# Patient Record
Sex: Female | Born: 1935
Health system: Southern US, Community
[De-identification: ages and names within clinical notes are randomized; demographics above are authoritative.]

## PROBLEM LIST (undated history)

## (undated) DIAGNOSIS — C4490 Unspecified malignant neoplasm of skin, unspecified: Secondary | ICD-10-CM

## (undated) DIAGNOSIS — R0600 Dyspnea, unspecified: Secondary | ICD-10-CM

## (undated) DIAGNOSIS — I509 Heart failure, unspecified: Secondary | ICD-10-CM

## (undated) DIAGNOSIS — M1990 Unspecified osteoarthritis, unspecified site: Secondary | ICD-10-CM

## (undated) DIAGNOSIS — F0390 Unspecified dementia without behavioral disturbance: Secondary | ICD-10-CM

## (undated) DIAGNOSIS — I10 Essential (primary) hypertension: Secondary | ICD-10-CM

## (undated) DIAGNOSIS — R0609 Other forms of dyspnea: Secondary | ICD-10-CM

## (undated) DIAGNOSIS — E785 Hyperlipidemia, unspecified: Secondary | ICD-10-CM

## (undated) DIAGNOSIS — I219 Acute myocardial infarction, unspecified: Secondary | ICD-10-CM

## (undated) DIAGNOSIS — Z9289 Personal history of other medical treatment: Secondary | ICD-10-CM

## (undated) DIAGNOSIS — I2510 Atherosclerotic heart disease of native coronary artery without angina pectoris: Secondary | ICD-10-CM

## (undated) DIAGNOSIS — E119 Type 2 diabetes mellitus without complications: Secondary | ICD-10-CM

## (undated) DIAGNOSIS — E7800 Pure hypercholesterolemia, unspecified: Secondary | ICD-10-CM

## (undated) HISTORY — DX: Other forms of dyspnea: R06.09

## (undated) HISTORY — DX: Dyspnea, unspecified: R06.00

## (undated) HISTORY — PX: ABDOMINAL HYSTERECTOMY: SHX81

## (undated) HISTORY — DX: Personal history of other medical treatment: Z92.89

## (undated) HISTORY — DX: Hyperlipidemia, unspecified: E78.5

---

## 2001-03-21 ENCOUNTER — Other Ambulatory Visit: Admission: RE | Admit: 2001-03-21 | Discharge: 2001-03-21 | Payer: Self-pay | Admitting: General Surgery

## 2002-08-23 ENCOUNTER — Ambulatory Visit (HOSPITAL_COMMUNITY): Admission: RE | Admit: 2002-08-23 | Discharge: 2002-08-23 | Payer: Self-pay | Admitting: Family Medicine

## 2002-08-23 ENCOUNTER — Encounter: Payer: Self-pay | Admitting: Family Medicine

## 2002-12-21 ENCOUNTER — Inpatient Hospital Stay (HOSPITAL_COMMUNITY): Admission: RE | Admit: 2002-12-21 | Discharge: 2002-12-26 | Payer: Self-pay | Admitting: Family Medicine

## 2002-12-24 ENCOUNTER — Encounter: Payer: Self-pay | Admitting: *Deleted

## 2003-03-02 ENCOUNTER — Emergency Department (HOSPITAL_COMMUNITY): Admission: EM | Admit: 2003-03-02 | Discharge: 2003-03-03 | Payer: Self-pay | Admitting: *Deleted

## 2003-03-03 ENCOUNTER — Encounter: Payer: Self-pay | Admitting: *Deleted

## 2003-03-07 ENCOUNTER — Encounter: Payer: Self-pay | Admitting: Family Medicine

## 2003-03-07 ENCOUNTER — Ambulatory Visit (HOSPITAL_COMMUNITY): Admission: RE | Admit: 2003-03-07 | Discharge: 2003-03-07 | Payer: Self-pay | Admitting: Family Medicine

## 2003-11-07 ENCOUNTER — Emergency Department (HOSPITAL_COMMUNITY): Admission: EM | Admit: 2003-11-07 | Discharge: 2003-11-07 | Payer: Self-pay | Admitting: Emergency Medicine

## 2003-11-14 ENCOUNTER — Emergency Department (HOSPITAL_COMMUNITY): Admission: EM | Admit: 2003-11-14 | Discharge: 2003-11-14 | Payer: Self-pay | Admitting: Emergency Medicine

## 2003-11-28 ENCOUNTER — Ambulatory Visit (HOSPITAL_COMMUNITY): Admission: RE | Admit: 2003-11-28 | Discharge: 2003-11-28 | Payer: Self-pay | Admitting: Cardiovascular Disease

## 2004-01-15 ENCOUNTER — Emergency Department (HOSPITAL_COMMUNITY): Admission: EM | Admit: 2004-01-15 | Discharge: 2004-01-15 | Payer: Self-pay | Admitting: Emergency Medicine

## 2004-07-18 ENCOUNTER — Emergency Department (HOSPITAL_COMMUNITY): Admission: EM | Admit: 2004-07-18 | Discharge: 2004-07-18 | Payer: Self-pay | Admitting: Emergency Medicine

## 2004-09-29 ENCOUNTER — Ambulatory Visit (HOSPITAL_COMMUNITY): Admission: RE | Admit: 2004-09-29 | Discharge: 2004-09-29 | Payer: Self-pay | Admitting: Family Medicine

## 2004-11-15 ENCOUNTER — Emergency Department (HOSPITAL_COMMUNITY): Admission: EM | Admit: 2004-11-15 | Discharge: 2004-11-16 | Payer: Self-pay | Admitting: *Deleted

## 2006-06-23 ENCOUNTER — Ambulatory Visit: Payer: Self-pay | Admitting: Gastroenterology

## 2006-07-05 DIAGNOSIS — Z9289 Personal history of other medical treatment: Secondary | ICD-10-CM

## 2006-07-05 HISTORY — DX: Personal history of other medical treatment: Z92.89

## 2006-07-12 ENCOUNTER — Ambulatory Visit (HOSPITAL_COMMUNITY): Admission: RE | Admit: 2006-07-12 | Discharge: 2006-07-12 | Payer: Self-pay | Admitting: Gastroenterology

## 2006-07-12 ENCOUNTER — Ambulatory Visit: Payer: Self-pay | Admitting: Gastroenterology

## 2006-08-01 ENCOUNTER — Ambulatory Visit (HOSPITAL_COMMUNITY): Admission: RE | Admit: 2006-08-01 | Discharge: 2006-08-01 | Payer: Self-pay | Admitting: *Deleted

## 2006-08-05 ENCOUNTER — Ambulatory Visit (HOSPITAL_COMMUNITY): Admission: RE | Admit: 2006-08-05 | Discharge: 2006-08-05 | Payer: Self-pay | Admitting: *Deleted

## 2008-05-10 ENCOUNTER — Ambulatory Visit (HOSPITAL_COMMUNITY): Admission: RE | Admit: 2008-05-10 | Discharge: 2008-05-10 | Payer: Self-pay | Admitting: Internal Medicine

## 2008-08-04 ENCOUNTER — Emergency Department (HOSPITAL_COMMUNITY): Admission: EM | Admit: 2008-08-04 | Discharge: 2008-08-05 | Payer: Self-pay | Admitting: Emergency Medicine

## 2009-01-28 ENCOUNTER — Ambulatory Visit (HOSPITAL_COMMUNITY): Admission: RE | Admit: 2009-01-28 | Discharge: 2009-01-28 | Payer: Self-pay | Admitting: Internal Medicine

## 2010-06-09 ENCOUNTER — Ambulatory Visit (HOSPITAL_COMMUNITY): Admission: RE | Admit: 2010-06-09 | Discharge: 2010-06-09 | Payer: Self-pay | Admitting: Internal Medicine

## 2010-10-30 ENCOUNTER — Emergency Department (HOSPITAL_COMMUNITY)
Admission: EM | Admit: 2010-10-30 | Discharge: 2010-10-30 | Payer: Self-pay | Source: Home / Self Care | Admitting: Emergency Medicine

## 2011-02-01 LAB — DIFFERENTIAL
Basophils Relative: 1 % (ref 0–1)
Eosinophils Absolute: 0.1 10*3/uL (ref 0.0–0.7)
Monocytes Absolute: 0.3 10*3/uL (ref 0.1–1.0)
Monocytes Relative: 6 % (ref 3–12)
Neutrophils Relative %: 82 % — ABNORMAL HIGH (ref 43–77)

## 2011-02-01 LAB — COMPREHENSIVE METABOLIC PANEL
ALT: 16 U/L (ref 0–35)
Alkaline Phosphatase: 28 U/L — ABNORMAL LOW (ref 39–117)
CO2: 27 mEq/L (ref 19–32)
Chloride: 104 mEq/L (ref 96–112)
Glucose, Bld: 150 mg/dL — ABNORMAL HIGH (ref 70–99)
Potassium: 4.5 mEq/L (ref 3.5–5.1)
Sodium: 139 mEq/L (ref 135–145)
Total Protein: 7 g/dL (ref 6.0–8.3)

## 2011-02-01 LAB — CBC
HCT: 35 % — ABNORMAL LOW (ref 36.0–46.0)
Hemoglobin: 11.6 g/dL — ABNORMAL LOW (ref 12.0–15.0)
RBC: 3.85 MIL/uL — ABNORMAL LOW (ref 3.87–5.11)
RDW: 14.4 % (ref 11.5–15.5)
WBC: 5.7 10*3/uL (ref 4.0–10.5)

## 2011-02-01 LAB — POCT CARDIAC MARKERS: Troponin i, poc: <0.05 ng/mL (ref 0.00–0.09)

## 2011-04-09 DIAGNOSIS — Z9289 Personal history of other medical treatment: Secondary | ICD-10-CM

## 2011-04-09 HISTORY — DX: Personal history of other medical treatment: Z92.89

## 2011-04-09 NOTE — Consult Note (Signed)
NAME:  Robin Guzman, Robin Guzman              ACCOUNT NO.:  [id number]   MEDICAL RECORD NO.:  [id number]         PATIENT TYPE:  AMB   LOCATION:                                FACILITY:  [facility]   PHYSICIAN:  Kassie Mends, M.D.      DATE OF BIRTH:  [birth year]   DATE OF CONSULTATION:  06/23/2006  DATE OF DISCHARGE:                                   CONSULTATION   REASON FOR CONSULTATION:  Anemia, colonoscopy.   HISTORY OF PRESENT ILLNESS:  Robin Guzman is a 77 year old Caucasian female  who presents for further evaluation of anemia.  She has never had a  colonoscopy.  She recently had routine blood work which revealed a  hemoglobin of 10.6, hematocrit 34.3, MCV was 93.7, and platelets 217,000.  Vitamin B12 was 1317, folate 10.2, iron 77, TIBC 456, iron saturation was  slightly low at 17%, and ferritin normal at 129.  She returned 3 Hemoccults;  however, we have not received those results.  She denies any problems with  her bowel movements.  She denies any melena, rectal bleeding, abdominal  pain, nausea, or vomiting.  She has heartburn, which is controlled on  Prevacid.  She has had GERD for approximately a year.  She denies any  dysphagia, odynophagia, or weight loss.   CURRENT MEDICATIONS:  Prevacid 30 mg daily, Tricor 145 mg daily, aspirin 81  mg daily, B12 at 2000 mg daily, Ocuvite 1 daily, Lipitor 20 mg daily, Tandem  1 daily, Metformin 500 mg b.i.d., Norvasc 5 mg daily, Coreg 25 mg 2 daily,  Avandia 4 mg 1 daily.   ALLERGIES:  DAYPRO and ADVIL.   PAST MEDICAL HISTORY:  Normocytic anemia, coronary artery disease,  hypertension, hypercholesterolemia, diabetes, and GERD.   PAST SURGICAL HISTORY:  She had a benign tumor removed from her abdomen  around age 77.  She does know any details.  Appendectomy and partial  hysterectomy.   FAMILY HISTORY:  Mother died of liver disease at age 35, did not consume  alcohol.  Father died of heart disease at age 24.  No other family members  with liver  disease to her knowledge.  No family history of colorectal  cancer.   SOCIAL HISTORY:  She is widowed and has 3 children.  She is retired  Scientist, research (life sciences).  She has never been a smoker.  No alcohol use.   REVIEW OF SYSTEMS:  GI:  See HPI.  CONSTITUTIONAL:  No weight loss.  CARDIOPULMONARY:  No chest pain or shortness of breath.   PHYSICAL EXAMINATION:  VITAL SIGNS:  Weight 162.  Height 5 feet 0 inches.  Temp 98.  Blood pressure 138/76.  Pulse 78.  GENERAL:  Pleasant, well-nourished, well-developed, elderly Caucasian female  in no acute distress.  SKIN:  Warm and dry.  No jaundice.  HEENT:  Pupils equal, round, and reactive to light.  Conjunctivae are pink.  Sclerae anicteric.  Oropharyngeal mucosa moist and pink.  No lesions,  erythema, or exudate.  No lymphadenopathy or thyromegaly.  CHEST:  Lungs are clear to auscultation.  CARDIAC:  Exam reveals regular rate and  rhythm.  No murmurs, rubs, or  gallops.  Normal S1 and S2.  ABDOMEN:  Positive bowel sounds.  Soft, nondistended, and nontender.  No  organomegaly or masses.  EXTREMITIES:  No edema.   IMPRESSION:  Robin Guzman is a 77 year old lady who was recently found to  have a mild normocytic anemia.  Hemoccult status unknown.  She had slight  low iron saturations for her serum iron and ferritin was normal.  She has  never had a colonoscopy, therefore recommend one.  She has gastroesophageal  reflux disease well controlled on proton pump inhibitor therapy.   PLAN:  1.  Colonoscopy in the near future with Kassie Mends, M.D.  2.  Hold aspirin for 4 days prior to the procedure.  3.  Will adjust her Metformin and Avandia to half dose day of prep.  4.  Hold Tandem for 7 days.  5.  Retrieve hemoccult results from Dr. Scharlene Gloss office.   I would like to thank Catalina Pizza, M.D. for allowing [place] to see Robin Guzman in  consultation.      Tana Coast, P.A.      Kassie Mends, M.D.  Electronically Signed    LL/MEDQ  D:  06/23/2006  T:   06/23/2006  Job:  [id number]   cc:   Catalina Pizza, M.D.  Fax: [phone]

## 2011-04-09 NOTE — H&P (Signed)
NAME:  Robin Guzman, Robin Guzman                        ACCOUNT NO.:  [id number]   MEDICAL RECORD NO.:  [id number]                   PATIENT TYPE:  INP   LOCATION:  A226                                 FACILITY:  [facility]   PHYSICIAN:  Mila Homer. Sudie Bailey, M.D.           DATE OF BIRTH:  [birth year]   DATE OF ADMISSION:  12/21/2002  DATE OF DISCHARGE:                                HISTORY & PHYSICAL   HISTORY OF PRESENT ILLNESS:  This 77 year old woman came to the office today  for follow up of hypertension.  She noted she had been feeling somewhat weak  recently and the last 4-6 weeks she had two to three episodes a week in  which she had palpitations lasting 10-15 minutes.  These would come on no  matter what she was doing whether it was energetic or not.   Currently lives at home.  Her husband died last 08/20/23.  She has had  hypertension but she has been on atenolol 50 mg once daily but  hydrochlorothiazide 25 mg once daily was added about a month ago due to  systolics around 150.   FAMILY HISTORY:  The patient has no family history for diabetes.   REVIEW OF SYSTEMS:  She has had nocturia x2 in the last month.  Mouth had  been very dry.   PHYSICAL EXAMINATION:  Exam in the office showed a pleasant elderly woman.  She was oriented/alert.  She was mildly obese in really no acute distress.  Mucous membranes were somewhat dry.  Negative cervical nodes.  The heart had  a regular rhythm, rate about 120 and after I told her she had to come to the  hospital it went up to 140.  Her lungs appeared clear throughout.  The  abdomen was soft without hepatosplenomegaly or mass, no tenderness.  There  was no edema in the ankles.  BP was about 114/70 on my check.  The pulse was  114.   LABORATORY DATA:  CBC showed H&H 14.1/42.0, MCV of 89, platelet count  294,000.  MET-7 showed a sodium 130, potassium 3.8, chloride 89, glucose  480, BUN 20, creatinine 1.2, SGOT 48, and SGPT 59.  She also had an EKG  which showed significant Q's in III, small Q's in II and aVF and also Q's in  V2, V3, V4, V5.   ADMISSION DIAGNOSES:  1. Diabetes mellitus.  2. Dehydration.  3. Electrolyte abnormalities.  4. Probable coronary artery disease status post possible inferior myocardial     infarction and acute myocardial infarction probably from silent infarcts.  5. Essential hypertension.   PLAN OF TREATMENT:  Continue her atenolol 25 mg once daily given and we are  starting to treat her diabetes with insulin.  Add Enalapril 10 mg once  daily, ASA 81 mg once daily, and have her on a cardiac monitor.  She will be  on normal saline IV 100 cc/hr initially with 10  mEq of KCl per liter.  Recheck MET-7 and CBC in the morning.  Cardiac enzymes will be done tonight  and in the morning.  Recheck an EKG tomorrow.  I have talked to  [facility] and given EKG findings which appear old and also  given the fact there are no beds at [facility] we will treat at  [facility] on a monitor.  Discussed this with the family and the  patient and they are in agreement to treat her here at present.                                               Mila Homer. Sudie Bailey, M.D.    SDK/MEDQ  D:  12/21/2002  T:  12/21/2002  Job:  [id number]

## 2011-04-09 NOTE — Group Therapy Note (Signed)
   NAME:  Robin Guzman, MCCAUL                        ACCOUNT NO.:  [id number]   MEDICAL RECORD NO.:  [id number]                   PATIENT TYPE:  INP   LOCATION:  A226                                 FACILITY:  [facility]   PHYSICIAN:  Mila Homer. Sudie Bailey, M.D.           DATE OF BIRTH:  [birth year]   DATE OF PROCEDURE:  12/24/2002  DATE OF DISCHARGE:                                   PROGRESS NOTE   SUBJECTIVE:  Generally, she is feeling somewhat better.  She still has some  feelings of being short of breath and funny feelings in her chest.   OBJECTIVE:  VITAL SIGNS:  Temperature 97.7, pulse 71, respiratory rate 20,  blood pressure 137/66.  GENERAL:  She is sitting up in bed in no acute distress.  Feet dangling,  eating breakfast.  She is well-developed and somewhat obese elderly woman  who is oriented and alert.  LUNGS:  Clear throughout.  HEART:  Regular rhythm without murmur, rate of 70.  ABDOMEN:  Soft without tenderness.  EXTREMITIES:  There is no edema of the ankles.  SKIN:  Skin turgor is normal.   LABORATORY DATA:  Today, the weight is 154.9 pounds.  Most recent sugars  have been 218, 365, 228, 218, and 322.  Her C peptide is 1.6.   ASSESSMENT:  1. Type 2 diabetes.  2. Probable coronary artery disease, status post inferior myocardial     infarction and anterior myocardial infarction.   PLAN:  Cardiology to see her today.  Start Glucophage 500 mg b.i.d.  Consider adding Avandia if okay with cardiology.                                               Mila Homer. Sudie Bailey, M.D.    SDK/MEDQ  D:  12/24/2002  T:  12/24/2002  Job:  [id number]

## 2011-04-09 NOTE — Cardiovascular Report (Signed)
NAME:  Robin Guzman, Robin Guzman              ACCOUNT NO.:  [id number]   MEDICAL RECORD NO.:  [id number]          PATIENT TYPE:  OIB   LOCATION:  2899                         FACILITY:  [facility]   PHYSICIAN:  Darlin Priestly, MD  DATE OF BIRTH:  20-Jan-1928   DATE OF PROCEDURE:  08/05/2006  DATE OF DISCHARGE:  08/05/2006                              CARDIAC CATHETERIZATION   PROCEDURE:  1. Left heart catheterization.  2. Coronary angiography.  3. Left ventriculogram.   ATTENDING PHYSICIAN:  Darlin Priestly, M.D.   COMPLICATIONS:  None.   INDICATIONS:  [name] is a 77 year old female, patient of Dr. Catalina Pizza, Dr. Ilene Qua with history of hypertension, diabetes,  hyperlipidemia, who has intermittent complaints of shortness of breath.  She  did have a remote Cardiolite scan revealing no significant ischemia.  However, she had repeat scan secondary to __________  shortness of breath on  August 14 suggesting moderate ischemia in the inferolateral and apical  distribution.  Because of her ongoing symptoms and now evidence of possible  ischemia, she is now referred for chronic catheterization to rule out  significant CAD.   DESCRIPTION OF PROCEDURE:  After giving informed consent, the patient was  brought to the cardiac catheterization laboratory.  Right and left groin  shaved, prepped and draped in the usual sterile fashion.  ECG monitoring  established.  Using the modified Seldinger technique, a #6 intra-arterial  sheath to the right femoral artery, a 6-French diagnostic catheter then  performed diagnostic angiography.   The left main is a large vessel with no significant disease.   The LAD is a medium size vessel which coursed towards the apex which was two  diagonal branches.  The LAD was noted to have some mild proximal  calcification with mild 20% proximal narrowing.  There is no further  significant disease in the LAD.   First diagonal is a small vessel with no  significant disease.   The second diagonal is a medium size vessel which bifurcates distally with  no significant disease.   The left circumflex is a medium size vessel which coursed __________  two  obtuse marginal branches.  The __________  circumflex has no significant  disease.   The first OM is a medium size vessel which bifurcates distally with no  significant disease.   The second OM is a small vessel with no significant disease.  There are  faint left to right collaterals noted to the distal PDA.   The right coronary artery is a large vessel which is dominant and begins  with PDA as well as posterolateral branch.  There is no significant disease  in the RCA, PDA and posterolateral branch.   Left ventriculogram reveals preserved EF of 70%.   Hemodynamics system reveals arterial pressure of 127/71,  LV systemic  pressure 120/8, LVEDP 15.   CONCLUSION:  1. No significant coronary artery disease.  2. Normal left ventricular systolic function.      Darlin Priestly, MD  Electronically Signed     RHM/MEDQ  D:  08/05/2006  T:  08/06/2006  Job:  [id number]   cc:   Catalina Pizza, M.D.  Dani Gobble, MD

## 2011-04-09 NOTE — Discharge Summary (Signed)
NAME:  Robin Guzman, Robin Guzman                        ACCOUNT NO.:  [id number]   MEDICAL RECORD NO.:  [id number]                   PATIENT TYPE:  INP   LOCATION:  A226                                 FACILITY:  [facility]   PHYSICIAN:  Mila Homer. Sudie Bailey, M.D.           DATE OF BIRTH:  [birth year]   DATE OF ADMISSION:  12/21/2002  DATE OF DISCHARGE:  12/26/2002                                 DISCHARGE SUMMARY   SUMMARY:  A 77 year old who was admitted to the hospital with diabetes.  She  had a benign six-day hospitalization extending from December 21, 2002 to  December 26, 2002.  Vital signs remained stable.   LABORATORY DATA:  Admission white count 6300 with an H&H 12.2/35.3.  Rechecked several days later at 11.7/34.4.  Admission sodium 130, chloride  94, bicarb 36, glucose 431.  Recheck:  Sodium 135, chloride 106, glucose  235.  Her AST was slightly elevated at 58 and ALT slightly elevated at 55.  The patient's cardiac enzymes were negative.  Recheck the following day:  Troponin slightly elevated at 0.05.  TSH 2.189.  C peptide 1.6.  Lipid  profile pending.   Admission EKG showed what appeared to be an old inferior infarct, old  anterior infarct, with sinus tachycardia.  Heart rate had dropped from 116  to 67 by the following day.  The day of discharge still had deepening Q in  V2 and V3 consistent at least with an anteroseptal MI.   HOSPITAL COURSE:  She was admitted to the hospital with Accu-Checks a.c. and  h.s., IV normal saline 100 mL/hour with 10 mEq KCl per liter, put on a  sliding scale Humulin R insulin.  After 2 liters of normal saline she was  switched to half normal saline.  She was started on enalapril  10 mg daily with atenolol 25 mg daily, EC-ASA 81 mg daily, p.r.n. Tylenol.  She was given Protonix 40 mg p.o. daily.  She was given an 1800 calorie  diet.   The following day with a low blood pressure, enalapril was decreased from 10  mg to 5 mg daily.  She was given Lovenox 1  mg/kg subcutaneous daily  prophylactically.  Hospital day #4 she was started on Glucophage 500 mg  b.i.d.  Her atenolol was increased to 50 daily, and then it was discontinued  and switched to Coreg 6.25 mg b.i.d.  Her diet was changed to 1500 calorie  ADA.  She actually did well on this regimen.  Her sugars gradually dropped  to the low 200 range.  She is up and around via the walker.   She was seen by [facility] with initial evaluation in the  hospital and plans for further evaluation outpatient.   DISCHARGE MEDICATIONS:  She was discharged home with:  1. Glucophage 500 mg b.i.d.  2. Coreg 6.25 mg b.i.d.  3. Enalapril 5 mg daily.   FOLLOW-UP:  In the  office in two days.   DISCHARGE INSTRUCTIONS:  She had a glucose monitoring machine she went home  with.  Discussed diet at length.  I discussed the importance of weight loss  at length.  Family was in attendance with this.   FINAL DISCHARGE DIAGNOSES:  1. Type 2 diabetes, poorly controlled.  2. Dehydration.  3. Electrolyte abnormalities.  4. Presumptive coronary artery disease with evidence for old silent     myocardial infarction.                                               Mila Homer. Sudie Bailey, M.D.    SDK/MEDQ  D:  12/26/2002  T:  12/26/2002  Job:  [id number]

## 2011-04-09 NOTE — Group Therapy Note (Signed)
   NAME:  Robin Guzman, Robin Guzman                        ACCOUNT NO.:  [id number]   MEDICAL RECORD NO.:  [id number]                   PATIENT TYPE:  INP   LOCATION:  A226                                 FACILITY:  [facility]   PHYSICIAN:  Mila Homer. Sudie Bailey, M.D.           DATE OF BIRTH:  Dec 09, 1927   DATE OF PROCEDURE:  DATE OF DISCHARGE:                                   PROGRESS NOTE   SUBJECTIVE:  The patient does feel much better.  Is drinking well.   OBJECTIVE:  GENERAL:  She is supine in bed, oriented, alert, in no acute  distress, well-developed, well-nourished, somewhat obese elderly female.  VITAL SIGNS:  Temperature 97.8, pulse 65, respiratory rate 20, blood  pressure 111/61.  HEART:  Regular rhythm, rate of about 70.  LUNGS:  Clear throughout.  ABDOMEN:  Soft without hepatosplenomegaly or mass.  No tenderness.  SKIN:  Turgor is normal.  Mucous membranes moist.  EXTREMITIES:  There is no edema of the ankles.   LABORATORIES:  Her glucose most recently were 218, 243, 202, 269.  Today's  white cell count is 4300, H&H 7.7/34.4 and after 7:00 she had a glucose 235,  but SGOT slightly elevated at 58, SGPT 55, albumin 2.8.   ASSESSMENT:  1. Type 2 diabetes mellitus (C. peptide 1.6).  2. Probable coronary artery disease status post IMI/AMI.  3. Electrolyte abnormalities have cleared.  4. Elevated LFTs, question etiology.  5. Essential hypertension, well controlled.   PLAN:  Continue with sliding scale insulin.  She is now on Glucophage 500 mg  b.i.d.  [facility] is evaluating her heart disease.                                               Mila Homer. Sudie Bailey, M.D.    SDK/MEDQ  D:  12/25/2002  T:  12/25/2002  Job:  [id number]

## 2011-04-09 NOTE — Op Note (Signed)
NAME:  Robin Guzman, Robin Guzman              ACCOUNT NO.:  [id number]   MEDICAL RECORD NO.:  [id number]          PATIENT TYPE:  AMB   LOCATION:  DAY                           FACILITY:  [facility]   PHYSICIAN:  Kassie Mends, M.D.      DATE OF BIRTH:  November 23, 1927   DATE OF PROCEDURE:  07/12/2006  DATE OF DISCHARGE:                                 OPERATIVE REPORT   PROCEDURE:  Colonoscopy.   INDICATION FOR EXAM:  [name] is a 77 year old female who presents with  normocytic anemia and average risk for developing colon cancer.   FINDINGS:  1. Normal colon.  No diverticula, inflammatory changes, polyps, masses, or      vascular ectasia seen.  2. Normal retroflexed view of the rectum.   RECOMMENDATIONS:  1. Consider hematology-oncology evaluation for normocytic anemia.  No      source for anemia identified.  2. Follow up with Dr. Dwana Melena.   PROCEDURE TECHNIQUE:  Physical exam was performed and informed consent was  obtained from the patient after explaining the benefits, risks and  alternatives to the procedure.  The patient was connected to the monitor and  placed in the left lateral position.  Continuous oxygen was provided via  nasal cannula and IV medicine administered through an indwelling cannula.  After administration of sedation and rectal exam, the scope was advanced  under direct visualization to the cecum.  The scope was subsequently removed  slowly by carefully examining the color, texture, anatomy and integrity of  mucosa on the way out.  The patient was recovered in the endoscopy suite and  discharged home in satisfactory condition.      Kassie Mends, M.D.  Electronically Signed     SM/MEDQ  D:  07/12/2006  T:  07/12/2006  Job:  [id number]   cc:   Catalina Pizza, M.D.  Fax: [phone]

## 2011-04-09 NOTE — Group Therapy Note (Signed)
   NAME:  Robin Guzman, Robin Guzman                        ACCOUNT NO.:  [id number]   MEDICAL RECORD NO.:  [id number]                   PATIENT TYPE:  INP   LOCATION:  A226                                 FACILITY:  [facility]   PHYSICIAN:  Angus G. Renard Matter, M.D.              DATE OF BIRTH:  1928/03/06   DATE OF PROCEDURE:  12/23/2002  DATE OF DISCHARGE:                                   PROGRESS NOTE   SUBJECTIVE:  This patient was admitted with poorly controlled diabetes,  dehydration.  She does have underlying coronary artery disease,  hypertension.  Her sugars were markedly elevated last night above 400.  The  patient remains on sliding scale Humulin R insulin.   OBJECTIVE:  Vital signs:  Blood pressure 94/49, respirations 20, pulse 65,  temperature 97.  Heart:  Regular rhythm.  Lungs:  Clear to P&A.  Abdomen:  No palpable organs or masses.   ASSESSMENT:  The patient was admitted with dehydration, poorly controlled  diabetes, electrolyte abnormality.   PLAN:  To continue current regimen.  Continue to monitor the patient's blood  sugars carefully.                                               Angus G. Renard Matter, M.D.    AGM/MEDQ  D:  12/23/2002  T:  12/24/2002  Job:  [id number]

## 2011-04-09 NOTE — Group Therapy Note (Signed)
   NAME:  BYANKA, LANDRUS                        ACCOUNT NO.:  [id number]   MEDICAL RECORD NO.:  [id number]                   PATIENT TYPE:  INP   LOCATION:  A226                                 FACILITY:  [facility]   PHYSICIAN:  Mila Homer. Sudie Bailey, M.D.           DATE OF BIRTH:  [birth year]   DATE OF PROCEDURE:  DATE OF DISCHARGE:                                   PROGRESS NOTE   SUBJECTIVE:  The patient feels slightly better than she did when she came in  last night.  She is now on enalapril 10 mg daily with atenolol 25 mg daily,  ASA 81 mg daily, Protonix 40 mg daily.  She is on IV normal saline and  sliding scale Humulin R.  I did note no more palpitations last night, but  once or twice felt like she had to take a deep breath.   OBJECTIVE:  Today the temperature 97 degrees, pulse 69, respiratory rate 18,  blood pressure 99/46.  Color is good.  She is oriented and alert in no acute  distress; well-developed, in fact somewhat obese; elderly woman.  The heart  has a regular rhythm with a rate of about 70.  The lungs are clear  throughout moving air well.  The abdomen was soft and obese, without  hepatosplenomegaly or mass.  There was trace edema of the ankles. Glucose  today was 431, sodium 130, chloride 94, bicarb 36.  CPK was 57, MB 3.0, and  troponin 0.04.  Her repeat EKG showed Q's in III and also in V3.  There were  flipped T's in III and V3.  There were also flipped T's in V4.   ASSESSMENT:  1. Probable type II diabetes mellitus.  2. Dehydration.  3. Electrolyte abnormalities.  4. Probable old IMI/AMI.   PLAN:  Continue IV fluids and a strict diet.  Will follow up with cardiology  in 2 days.  Will check a CPAP by tomorrow.  Will start Lovenox  prophylactically.                                               Mila Homer. Sudie Bailey, M.D.    SDK/MEDQ  D:  12/22/2002  T:  12/22/2002  Job:  [id number]

## 2011-08-25 LAB — BASIC METABOLIC PANEL
CO2: 26
Calcium: 9.1
Creatinine, Ser: 0.84
GFR calc Af Amer: >60
Glucose, Bld: 137 — ABNORMAL HIGH

## 2011-08-25 LAB — CBC
MCHC: 33.6
Platelets: 175
RDW: 15

## 2011-08-25 LAB — DIFFERENTIAL
Basophils Absolute: 0
Basophils Relative: 0
Neutro Abs: 4.3
Neutrophils Relative %: 76

## 2011-08-25 LAB — POCT CARDIAC MARKERS
CKMB, poc: 1.2
Troponin i, poc: <0.05

## 2012-02-16 ENCOUNTER — Other Ambulatory Visit (HOSPITAL_COMMUNITY): Payer: Self-pay | Admitting: Internal Medicine

## 2012-02-16 ENCOUNTER — Ambulatory Visit (HOSPITAL_COMMUNITY)
Admission: RE | Admit: 2012-02-16 | Discharge: 2012-02-16 | Disposition: A | Discharge status: Home / Self Care | Payer: Medicare Other | Source: Ambulatory Visit | Attending: Internal Medicine | Admitting: Internal Medicine

## 2012-02-16 DIAGNOSIS — R05 Cough: Secondary | ICD-10-CM

## 2012-02-16 DIAGNOSIS — R059 Cough, unspecified: Secondary | ICD-10-CM | POA: Diagnosis not present

## 2012-03-08 DIAGNOSIS — D0430 Carcinoma in situ of skin of unspecified part of face: Secondary | ICD-10-CM | POA: Diagnosis not present

## 2012-03-08 DIAGNOSIS — D0439 Carcinoma in situ of skin of other parts of face: Secondary | ICD-10-CM | POA: Diagnosis not present

## 2012-03-08 DIAGNOSIS — L989 Disorder of the skin and subcutaneous tissue, unspecified: Secondary | ICD-10-CM | POA: Diagnosis not present

## 2012-03-20 DIAGNOSIS — D0439 Carcinoma in situ of skin of other parts of face: Secondary | ICD-10-CM | POA: Diagnosis not present

## 2012-06-27 DIAGNOSIS — D0439 Carcinoma in situ of skin of other parts of face: Secondary | ICD-10-CM | POA: Diagnosis not present

## 2012-07-19 DIAGNOSIS — Z23 Encounter for immunization: Secondary | ICD-10-CM | POA: Diagnosis not present

## 2012-07-26 DIAGNOSIS — E119 Type 2 diabetes mellitus without complications: Secondary | ICD-10-CM | POA: Diagnosis not present

## 2012-07-26 DIAGNOSIS — I10 Essential (primary) hypertension: Secondary | ICD-10-CM | POA: Diagnosis not present

## 2012-07-26 DIAGNOSIS — I5021 Acute systolic (congestive) heart failure: Secondary | ICD-10-CM | POA: Diagnosis not present

## 2013-02-03 ENCOUNTER — Encounter (HOSPITAL_COMMUNITY): Payer: Self-pay

## 2013-02-03 ENCOUNTER — Emergency Department (HOSPITAL_COMMUNITY)
Admission: EM | Admit: 2013-02-03 | Discharge: 2013-02-03 | Disposition: A | Discharge status: Home / Self Care | Payer: Medicare Other | Attending: Emergency Medicine | Admitting: Emergency Medicine

## 2013-02-03 ENCOUNTER — Emergency Department (HOSPITAL_COMMUNITY): Payer: Medicare Other

## 2013-02-03 DIAGNOSIS — M25512 Pain in left shoulder: Secondary | ICD-10-CM

## 2013-02-03 DIAGNOSIS — M19019 Primary osteoarthritis, unspecified shoulder: Secondary | ICD-10-CM | POA: Diagnosis not present

## 2013-02-03 DIAGNOSIS — M25519 Pain in unspecified shoulder: Secondary | ICD-10-CM | POA: Insufficient documentation

## 2013-02-03 DIAGNOSIS — R0602 Shortness of breath: Secondary | ICD-10-CM | POA: Insufficient documentation

## 2013-02-03 DIAGNOSIS — R079 Chest pain, unspecified: Secondary | ICD-10-CM | POA: Diagnosis not present

## 2013-02-03 HISTORY — DX: Heart failure, unspecified: I50.9

## 2013-02-03 HISTORY — DX: Pure hypercholesterolemia, unspecified: E78.00

## 2013-02-03 HISTORY — DX: Type 2 diabetes mellitus without complications: E11.9

## 2013-02-03 HISTORY — DX: Essential (primary) hypertension: I10

## 2013-02-03 LAB — CBC WITH DIFFERENTIAL/PLATELET
Basophils Absolute: 0 10*3/uL (ref 0.0–0.1)
Eosinophils Relative: 1 % (ref 0–5)
Lymphocytes Relative: 10 % — ABNORMAL LOW (ref 12–46)
Lymphs Abs: 0.6 10*3/uL — ABNORMAL LOW (ref 0.7–4.0)
Neutro Abs: 4.9 10*3/uL (ref 1.7–7.7)
Neutrophils Relative %: 84 % — ABNORMAL HIGH (ref 43–77)
Platelets: 207 10*3/uL (ref 150–400)
RBC: 4.04 MIL/uL (ref 3.87–5.11)
RDW: 14.3 % (ref 11.5–15.5)
WBC: 5.8 10*3/uL (ref 4.0–10.5)

## 2013-02-03 LAB — COMPREHENSIVE METABOLIC PANEL
ALT: 15 U/L (ref 0–35)
AST: 18 U/L (ref 0–37)
Alkaline Phosphatase: 30 U/L — ABNORMAL LOW (ref 39–117)
CO2: 26 mEq/L (ref 19–32)
Calcium: 9.6 mg/dL (ref 8.4–10.5)
Chloride: 102 mEq/L (ref 96–112)
GFR calc non Af Amer: 44 mL/min — ABNORMAL LOW (ref >90)
Potassium: 4.7 mEq/L (ref 3.5–5.1)
Sodium: 137 mEq/L (ref 135–145)

## 2013-02-03 MED ORDER — TRAMADOL HCL 50 MG PO TABS: 50.0000 mg | ORAL_TABLET | Freq: Four times a day (QID) | ORAL | Status: DC | PRN

## 2013-02-03 MED ORDER — TRAMADOL HCL 50 MG PO TABS
50.0000 mg | ORAL_TABLET | Freq: Once | ORAL | Status: AC
  Administered 2013-02-03: 50 mg via ORAL
  Filled 2013-02-03: qty 1

## 2013-02-03 NOTE — ED Provider Notes (Signed)
History     This chart was scribed for Robin Lyons, MD, MD by Smitty Pluck, ED Scribe. The patient was seen in room APA06/APA06 and the patient's care was started at 7:44 AM.   CSN: [id number]  Arrival date & time 02/03/13  0725      No chief complaint on file.    The history is provided by the patient, a relative and medical records. No language interpreter was used.   Robin Guzman is a 79 y.o. female with hx of DM, MI and CHF who presents to the Emergency Department complaining of waxing and waning, severe left shoulder pain radiating to lett arm onset 3 days ago. She denies hx of similar symptoms. She states the pain takes her breath away. She reports that when pain is at its worst she has SOB. She denies alleviating factors. Pt states that shoulder pain is aggravated by movement of left arm. She reports taking tylenol with minor relief. Pt denies injury to shoulder, fever, chills, nausea, vomiting, diarrhea, weakness in left arm, cough and any other pain.   No past medical history on file.  No past surgical history on file.  No family history on file.  History  Substance Use Topics  . Smoking status: Not on file  . Smokeless tobacco: Not on file  . Alcohol Use: Not on file    OB History   No data available      Review of Systems  Constitutional: Negative for fever and chills.  Respiratory: Positive for shortness of breath.   Gastrointestinal: Negative for nausea and vomiting.  Musculoskeletal: Positive for arthralgias.  Neurological: Negative for weakness and numbness.  All other systems reviewed and are negative.    Allergies  Review of patient's allergies indicates not on file.  Home Medications  No current outpatient prescriptions on file.  BP 180/73  Pulse 80  Temp(Src) 97.7 F (36.5 C) (Oral)  Resp 16  Ht 5' (1.524 m)  Wt 155 lb (70.308 kg)  BMI 30.27 kg/m2  SpO2 96%  Physical Exam  Nursing note and vitals reviewed. Constitutional: She is  oriented to person, place, and time. She appears well-developed and well-nourished. No distress.  HENT:  Head: Normocephalic and atraumatic.  Eyes: Conjunctivae are normal.  Cardiovascular: Normal rate, regular rhythm and normal heart sounds.   Pulmonary/Chest: Effort normal and breath sounds normal. No respiratory distress. She has no wheezes.  Musculoskeletal:  Left shoulder appears grossly normal. There is mild tenderness to palpation in scapular region that seems to reproduce her pain. Shoulder has good ROM. Distal pulses and motor are intact.   Neurological: She is alert and oriented to person, place, and time.  Skin: Skin is warm and dry.  Psychiatric: She has a normal mood and affect. Her behavior is normal.    ED Course  Procedures (including critical care time) DIAGNOSTIC STUDIES: Oxygen Saturation is 96% on room air, adequate by my interpretation.    COORDINATION OF CARE: 7:48 AM Discussed ED treatment with pt and pt agrees.  7:48 AM Ordered:  Medications  traMADol (ULTRAM) tablet 50 mg (50 mg Oral Given 02/03/13 0827)   9:30 AM Recheck: Discussed lab results and treatment course with pt. Pt is feeling better. Pt is ready for discharge.      Labs Reviewed  CBC WITH DIFFERENTIAL - Abnormal; Notable for the following:    Neutrophils Relative 84 (*)    Lymphocytes Relative 10 (*)    Lymphs Abs 0.6 (*)  All other components within normal limits  COMPREHENSIVE METABOLIC PANEL - Abnormal; Notable for the following:    Glucose, Bld 109 (*)    Creatinine, Ser 1.14 (*)    Alkaline Phosphatase 30 (*)    GFR calc non Af Amer 44 (*)    GFR calc Af Amer 50 (*)    All other components within normal limits  TROPONIN I   Dg Chest 2 View  02/03/2013  *RADIOLOGY REPORT*  Clinical Data: Shoulder pain.  Chest pain.  CHEST - 2 VIEW  Comparison: Two-view chest 02/16/2012.  Findings: Mild cardiomegaly is evident.  There is no edema or effusion to suggest failure.  The lungs are  clear.  The visualized soft tissues and bony thorax are unremarkable.  IMPRESSION:  1.  Mild cardiomegaly without failure. 2.  No acute cardiopulmonary disease.   Original Report Authenticated By: Marin Roberts, M.D.    Dg Shoulder Left  02/03/2013  *RADIOLOGY REPORT*  Clinical Data: Left shoulder pain.  Chest pain.  LEFT SHOULDER - 2+ VIEW  Comparison: None.  Findings: The left shoulder is located.  Degenerative changes are evident at the [facility] joint.  No acute bone or soft tissue abnormalities present.  The visualized left hemithorax is clear.  IMPRESSION: Negative left shoulder.   Original Report Authenticated By: Marin Roberts, M.D.      No diagnosis found.   Date: 02/03/2013  Rate: 73  Rhythm: normal sinus rhythm  QRS Axis: normal  Intervals: normal  ST/T Wave abnormalities: normal  Conduction Disutrbances:none  Narrative Interpretation:   Old EKG Reviewed: unchanged    MDM  The patient presents here with pain in the posterior aspect of the left shoulder that seems very musculoskeletal in nature.  The workup does not suggest a cardiac etiology despite symptoms for the past three days.  She is feeling better with tramadol and I believe she is stable for discharge with the same.  To follow up or return prn if she worsens.        I personally performed the services described in this documentation, which was scribed in my presence. The recorded information has been reviewed and is accurate.      Robin Lyons, MD 02/03/13 [phone]

## 2013-02-03 NOTE — ED Notes (Signed)
Pt arrives to er via POV complaining of left shoulder pain that began about two or three days ago. Pt denies any injury. Pt states she went to her Dr Wednesday but he was not able to see her. Pt took some tylenol and felt some relief but the pain has returned this morning.

## 2013-02-14 DIAGNOSIS — M5412 Radiculopathy, cervical region: Secondary | ICD-10-CM | POA: Diagnosis not present

## 2013-02-23 DIAGNOSIS — M5412 Radiculopathy, cervical region: Secondary | ICD-10-CM | POA: Diagnosis not present

## 2013-02-24 ENCOUNTER — Encounter (HOSPITAL_COMMUNITY): Payer: Self-pay

## 2013-02-24 ENCOUNTER — Emergency Department (HOSPITAL_COMMUNITY)
Admission: EM | Admit: 2013-02-24 | Discharge: 2013-02-24 | Disposition: A | Discharge status: Home / Self Care | Payer: Medicare Other | Attending: Emergency Medicine | Admitting: Emergency Medicine

## 2013-02-24 DIAGNOSIS — E119 Type 2 diabetes mellitus without complications: Secondary | ICD-10-CM | POA: Diagnosis not present

## 2013-02-24 DIAGNOSIS — E785 Hyperlipidemia, unspecified: Secondary | ICD-10-CM | POA: Diagnosis not present

## 2013-02-24 DIAGNOSIS — Z7982 Long term (current) use of aspirin: Secondary | ICD-10-CM | POA: Diagnosis not present

## 2013-02-24 DIAGNOSIS — I509 Heart failure, unspecified: Secondary | ICD-10-CM | POA: Diagnosis not present

## 2013-02-24 DIAGNOSIS — Z79899 Other long term (current) drug therapy: Secondary | ICD-10-CM | POA: Insufficient documentation

## 2013-02-24 DIAGNOSIS — E7800 Pure hypercholesterolemia, unspecified: Secondary | ICD-10-CM | POA: Diagnosis not present

## 2013-02-24 DIAGNOSIS — M25512 Pain in left shoulder: Secondary | ICD-10-CM

## 2013-02-24 DIAGNOSIS — M25519 Pain in unspecified shoulder: Secondary | ICD-10-CM | POA: Diagnosis not present

## 2013-02-24 DIAGNOSIS — I10 Essential (primary) hypertension: Secondary | ICD-10-CM | POA: Insufficient documentation

## 2013-02-24 MED ORDER — HYDROMORPHONE HCL PF 1 MG/ML IJ SOLN
1.0000 mg | Freq: Once | INTRAMUSCULAR | Status: AC
  Administered 2013-02-24: 1 mg via INTRAMUSCULAR
  Filled 2013-02-24: qty 1

## 2013-02-24 MED ORDER — HYDROMORPHONE HCL 4 MG PO TABS: 4.0000 mg | ORAL_TABLET | Freq: Four times a day (QID) | ORAL | Status: DC | PRN

## 2013-02-24 NOTE — ED Provider Notes (Signed)
History  This chart was scribed for Robin Lennert, MD by Robin Guzman, ED Scribe. This patient was seen in room APA12/APA12 and the patient's care was started at 3:12 PM.  CSN: [id number]  Arrival date & time 02/24/13  1508   First MD Initiated Contact with Patient 02/24/13 1512      Chief Complaint  Patient presents with  . Shoulder Pain     Patient is a 79 y.o. female presenting with shoulder pain. The history is provided by the patient. No language interpreter was used.  Shoulder Pain This is a new problem. The current episode started more than 1 week ago. The problem occurs constantly. The problem has not changed since onset.Pertinent negatives include no chest pain. Nothing relieves the symptoms.    Robin Guzman is a 79 y.o. female who presents to the Emergency Department complaining of 3 weeks of gradual onset, gradually worsening, constant left shoulder pain that radiates into her back. She denies any known injuries and reports that the pain is aggravated by movement of the left arm. has been seen in the ED and by her PCP multiple times for the same. She was seen by her PCP yesterday, had a negative xray done and was given 5 mg hydrocodone with mild improvement. She states that she was told that if the pain continued an MRI would be scheduled. Daughter also states that the pt has finished one round of prednisone with no improvement. Pt denies any other associated symptoms. She has a h/o DM, HTN, HLD and CHF. She denies smoking and alcohol use.  PCP is Dr. Margo Guzman  Past Medical History  Diagnosis Date  . Diabetes mellitus without complication   . CHF (congestive heart failure)   . Hypertension   . Hypercholesteremia     Past Surgical History  Procedure Laterality Date  . Abdominal hysterectomy      No family history on file.  History  Substance Use Topics  . Smoking status: Never Smoker   . Smokeless tobacco: Not on file  . Alcohol Use: No   No OB history  provided.  Review of Systems  Constitutional: Negative for fatigue.  HENT: Negative for congestion, sinus pressure and ear discharge.   Respiratory: Negative for cough.   Cardiovascular: Negative for chest pain.  Genitourinary: Negative for frequency and hematuria.  Musculoskeletal: Positive for arthralgias. Negative for back pain.  Skin: Negative for rash.    Allergies  Advil and Daypro  Home Medications   Current Outpatient Rx  Name  Route  Sig  Dispense  Refill  . amLODipine (NORVASC) 5 MG tablet   Oral   Take 5 mg by mouth daily.         [name] aspirin EC 81 MG tablet   Oral   Take 81 mg by mouth daily.         [name] atorvastatin (LIPITOR) 20 MG tablet   Oral   Take 20 mg by mouth daily.         . carvedilol (COREG) 25 MG tablet   Oral   Take 25 mg by mouth daily.         . Choline Fenofibrate (TRILIPIX) 135 MG capsule   Oral   Take 135 mg by mouth daily.         . enalapril (VASOTEC) 20 MG tablet   Oral   Take 20 mg by mouth daily.         [name] esomeprazole (NEXIUM) 40 MG capsule  Oral   Take 40 mg by mouth daily as needed (for hearburn).          [name] glipiZIDE (GLUCOTROL XL) 2.5 MG 24 hr tablet   Oral   Take 2.5 mg by mouth daily.         . metFORMIN (GLUCOPHAGE) 500 MG tablet   Oral   Take 500 mg by mouth 2 (two) times daily.         . traMADol (ULTRAM) 50 MG tablet   Oral   Take 1 tablet (50 mg total) by mouth every 6 (six) hours as needed for pain.   20 tablet   0     Triage Vitals: BP 168/90  Pulse 87  Temp(Src) 97.9 F (36.6 C)  Resp 20  Ht 5\' 4"  (1.626 m)  Wt 155 lb (70.308 kg)  BMI 26.59 kg/m2  SpO2 99%  Physical Exam  Nursing note and vitals reviewed. Constitutional: She is oriented to person, place, and time. She appears well-developed and well-nourished.  HENT:  Head: Normocephalic and atraumatic.  Eyes: Conjunctivae are normal.  Neck: No tracheal deviation present.  Cardiovascular: Normal rate.   No murmur  heard. Pulmonary/Chest: Effort normal.  Musculoskeletal:  Pain with extension of left shoulder, neurovascularly intact   Neurological: She is alert and oriented to person, place, and time.  Skin: Skin is warm and dry.  Psychiatric: She has a normal mood and affect. Her behavior is normal.    ED Course  Procedures (including critical care time)  DIAGNOSTIC STUDIES: Oxygen Saturation is 99% on room air, normal by my interpretation.    COORDINATION OF CARE: 3:22 PM-Discussed treatment plan which includes Dilaudid injection with pt at bedside and pt agreed to plan.   3:30 PM- Ordered 1 mg Dilaudid injection  4:21 PM-Pt rechecked and reports improved with medications listed above. Discussed discharge plan which includes Dilaudid prescription with pt and pt agreed to plan. Daughter states that the pt's PCP is in the process of setting up an MRI appointment.   Labs Reviewed - No data to display No results found.   No diagnosis found.    MDM        The chart was scribed for me under my direct supervision.  I personally performed the history, physical, and medical decision making and all procedures in the evaluation of this patient.Robin Lennert, MD 02/24/13 [phone]

## 2013-02-24 NOTE — ED Notes (Signed)
EKG was ordered by triage RN. Before beginning EKG Dr.Zammit came into room and asked that EKG not be completed until pt was evaluated. ED tech assigned to that side aware that EKG has not been completed.

## 2013-02-24 NOTE — ED Notes (Signed)
Pt reports left shoulder pain since march 15th, has been to ed and pmd multiple times. deneis any known injury

## 2013-02-24 NOTE — ED Notes (Signed)
Patient with no complaints at this time. Respirations even and unlabored. Skin warm/dry. Discharge instructions reviewed with patient at this time. Patient given opportunity to voice concerns/ask questions. Patient discharged at this time and left Emergency Department with steady gait.   

## 2013-02-27 ENCOUNTER — Other Ambulatory Visit (HOSPITAL_COMMUNITY): Payer: Self-pay | Admitting: Internal Medicine

## 2013-02-27 DIAGNOSIS — M5412 Radiculopathy, cervical region: Secondary | ICD-10-CM

## 2013-03-01 ENCOUNTER — Ambulatory Visit (HOSPITAL_COMMUNITY)
Admission: RE | Admit: 2013-03-01 | Discharge: 2013-03-01 | Disposition: A | Discharge status: Home / Self Care | Payer: Medicare Other | Source: Ambulatory Visit | Attending: Internal Medicine | Admitting: Internal Medicine

## 2013-03-01 DIAGNOSIS — R209 Unspecified disturbances of skin sensation: Secondary | ICD-10-CM | POA: Insufficient documentation

## 2013-03-01 DIAGNOSIS — M4310 Spondylolisthesis, site unspecified: Secondary | ICD-10-CM | POA: Diagnosis not present

## 2013-03-01 DIAGNOSIS — M5412 Radiculopathy, cervical region: Secondary | ICD-10-CM

## 2013-03-01 DIAGNOSIS — M4802 Spinal stenosis, cervical region: Secondary | ICD-10-CM | POA: Insufficient documentation

## 2013-03-01 DIAGNOSIS — M542 Cervicalgia: Secondary | ICD-10-CM | POA: Diagnosis not present

## 2013-03-02 DIAGNOSIS — H52229 Regular astigmatism, unspecified eye: Secondary | ICD-10-CM | POA: Diagnosis not present

## 2013-03-02 DIAGNOSIS — E119 Type 2 diabetes mellitus without complications: Secondary | ICD-10-CM | POA: Diagnosis not present

## 2013-03-02 DIAGNOSIS — H5200 Hypermetropia, unspecified eye: Secondary | ICD-10-CM | POA: Diagnosis not present

## 2013-03-02 DIAGNOSIS — H524 Presbyopia: Secondary | ICD-10-CM | POA: Diagnosis not present

## 2013-03-26 DIAGNOSIS — M47812 Spondylosis without myelopathy or radiculopathy, cervical region: Secondary | ICD-10-CM | POA: Diagnosis not present

## 2013-07-11 DIAGNOSIS — Z23 Encounter for immunization: Secondary | ICD-10-CM | POA: Diagnosis not present

## 2013-07-18 ENCOUNTER — Encounter: Payer: Self-pay | Admitting: *Deleted

## 2013-07-19 ENCOUNTER — Encounter: Payer: Self-pay | Admitting: *Deleted

## 2013-07-20 ENCOUNTER — Encounter: Payer: Self-pay | Admitting: Cardiovascular Disease

## 2013-07-24 ENCOUNTER — Encounter: Payer: Self-pay | Admitting: Cardiovascular Disease

## 2013-07-24 ENCOUNTER — Ambulatory Visit (INDEPENDENT_AMBULATORY_CARE_PROVIDER_SITE_OTHER): Payer: Medicare Other | Admitting: Cardiovascular Disease

## 2013-07-24 VITALS — BP 142/76 | HR 70 | Resp 20 | Ht 60.0 in | Wt 148.0 lb

## 2013-07-24 DIAGNOSIS — I10 Essential (primary) hypertension: Secondary | ICD-10-CM | POA: Diagnosis not present

## 2013-07-24 DIAGNOSIS — I509 Heart failure, unspecified: Secondary | ICD-10-CM

## 2013-07-24 DIAGNOSIS — I2510 Atherosclerotic heart disease of native coronary artery without angina pectoris: Secondary | ICD-10-CM | POA: Diagnosis not present

## 2013-07-24 DIAGNOSIS — R0609 Other forms of dyspnea: Secondary | ICD-10-CM | POA: Diagnosis not present

## 2013-07-24 DIAGNOSIS — E119 Type 2 diabetes mellitus without complications: Secondary | ICD-10-CM

## 2013-07-24 DIAGNOSIS — I5033 Acute on chronic diastolic (congestive) heart failure: Secondary | ICD-10-CM

## 2013-07-24 DIAGNOSIS — R0600 Dyspnea, unspecified: Secondary | ICD-10-CM

## 2013-07-24 DIAGNOSIS — I5032 Chronic diastolic (congestive) heart failure: Secondary | ICD-10-CM

## 2013-07-24 DIAGNOSIS — E785 Hyperlipidemia, unspecified: Secondary | ICD-10-CM

## 2013-07-24 MED ORDER — FUROSEMIDE 20 MG PO TABS: 20.0000 mg | ORAL_TABLET | Freq: Every day | ORAL | Status: DC

## 2013-07-24 MED ORDER — CARVEDILOL 25 MG PO TABS: 12.5000 mg | ORAL_TABLET | Freq: Two times a day (BID) | ORAL | Status: DC

## 2013-07-24 NOTE — Patient Instructions (Addendum)
Your physician has recommended you make the following change in your medication: FUROSEMIDE 20 MG DAILY, TAKE COREG 12.5 MG TWICE DAILY  Your physician recommends that you weigh, daily, at the same time every day, and in the same amount of clothing. Please record your daily weights on the handout provided and bring it to your next appointment.  Your physician has requested that you have an echocardiogram. Echocardiography is a painless test that uses sound waves to create images of your heart. It provides your doctor with information about the size and shape of your heart and how well your heart's chambers and valves are working. This procedure takes approximately one hour. There are no restrictions for this procedure.  Your physician recommends that you schedule a follow-up appointment in: 4 WEEKS

## 2013-07-31 ENCOUNTER — Ambulatory Visit (HOSPITAL_COMMUNITY)
Admission: RE | Admit: 2013-07-31 | Discharge: 2013-07-31 | Disposition: A | Discharge status: Home / Self Care | Payer: Medicare Other | Source: Ambulatory Visit | Attending: Cardiovascular Disease | Admitting: Cardiovascular Disease

## 2013-07-31 DIAGNOSIS — R0602 Shortness of breath: Secondary | ICD-10-CM

## 2013-07-31 DIAGNOSIS — R0609 Other forms of dyspnea: Secondary | ICD-10-CM | POA: Insufficient documentation

## 2013-07-31 DIAGNOSIS — I509 Heart failure, unspecified: Secondary | ICD-10-CM | POA: Diagnosis not present

## 2013-07-31 DIAGNOSIS — R0600 Dyspnea, unspecified: Secondary | ICD-10-CM

## 2013-07-31 DIAGNOSIS — I5033 Acute on chronic diastolic (congestive) heart failure: Secondary | ICD-10-CM

## 2013-07-31 DIAGNOSIS — R0989 Other specified symptoms and signs involving the circulatory and respiratory systems: Secondary | ICD-10-CM | POA: Insufficient documentation

## 2013-07-31 NOTE — Progress Notes (Signed)
2D Echo Performed 07/31/2013    Aydan Phoenix, RCS  

## 2013-08-07 DIAGNOSIS — I5032 Chronic diastolic (congestive) heart failure: Secondary | ICD-10-CM | POA: Insufficient documentation

## 2013-08-07 DIAGNOSIS — I10 Essential (primary) hypertension: Secondary | ICD-10-CM | POA: Insufficient documentation

## 2013-08-07 DIAGNOSIS — E7800 Pure hypercholesterolemia, unspecified: Secondary | ICD-10-CM | POA: Insufficient documentation

## 2013-08-07 DIAGNOSIS — E119 Type 2 diabetes mellitus without complications: Secondary | ICD-10-CM | POA: Insufficient documentation

## 2013-08-07 NOTE — Assessment & Plan Note (Signed)
Target BP is less than 130/80 secondary to the presence of diabetes, but caution with excessive BP control is advised in this octogenarian.

## 2013-08-07 NOTE — Assessment & Plan Note (Signed)
Reports "good" glycemic control

## 2013-08-07 NOTE — Assessment & Plan Note (Addendum)
At this point in time the suspicion for coronary etiology of her symptoms is low, but might consider a repeat functional study such as a nuclear stress test. Note that in 2007 she had a "false positive" study that suggested anteroseptal ischemia. She actually had an occluded distal posterior descending artery that fills via left to right collaterals but had no disease in the LAD distribution.

## 2013-08-07 NOTE — Progress Notes (Signed)
Patient ID: Robin Guzman, female   DOB: 09-28-1928, 79 y.o.   MRN: [id number]     Reason for office visit Robin Guzman is a moderately obese elderly woman with a history of systemic hypertension, diabetes and minor coronary atherosclerosis who had an episode of congestive heart failure roughly 2 years ago that improved after discontinuation of Actos. Now presents with similar symptoms of shortness of breath on exertion despite not being on a thiazolidinedione. Her dyspnea fits with a NYHA functional class 2-3 status. She has not had much in the way of ankle swelling this time. She has been taking the carvedilol 25 mg once daily instead of the prescribed 12.5 mg twice a day. She denies chest pain dizziness syncope and other neurological problems    Allergies  Allergen Reactions  . Advil [Ibuprofen] Other (See Comments)    weakness  . Daypro [Oxaprozin] Other (See Comments)    weakness    Current Outpatient Prescriptions  Medication Sig Dispense Refill  . amLODipine (NORVASC) 5 MG tablet Take 5 mg by mouth daily.      [name] aspirin EC 81 MG tablet Take 81 mg by mouth daily.      [name] atorvastatin (LIPITOR) 20 MG tablet Take 20 mg by mouth daily.      . carvedilol (COREG) 25 MG tablet Take 0.5 tablets (12.5 mg total) by mouth 2 (two) times daily with a meal.      . Choline Fenofibrate (TRILIPIX) 135 MG capsule Take 135 mg by mouth daily.      . enalapril (VASOTEC) 20 MG tablet Take 20 mg by mouth daily.      [name] esomeprazole (NEXIUM) 40 MG capsule Take 40 mg by mouth daily as needed (for hearburn).       [name] glipiZIDE (GLUCOTROL XL) 2.5 MG 24 hr tablet Take 2.5 mg by mouth daily.      . metFORMIN (GLUCOPHAGE) 500 MG tablet Take 500 mg by mouth 2 (two) times daily.      . furosemide (LASIX) 20 MG tablet Take 1 tablet (20 mg total) by mouth daily.  90 tablet  3   No current facility-administered medications for this visit.    Past Medical History  Diagnosis Date  . Diabetes mellitus without  complication   . CHF (congestive heart failure)   . Hypertension   . Hypercholesteremia   . Dyspnea on exertion   . Hx of echocardiogram 04/09/2011    EF 55% Mildly hypertrophic left ventricle with normal systolic function, Moderate (grade II) diastolic dysfunction. Elevated left atrial pressure. No significant valvular abnormalities. No pericardial effusion. Mild to moderate pulmonary arterial hypertension.  [name] History of stress test 07/05/2006    High risk scan cardiac cathe was recommended.  . Dyslipidemia     Past Surgical History  Procedure Laterality Date  . Abdominal hysterectomy      Family History  Problem Relation Age of Onset  . Cancer Mother   . Heart Problems Father   . Cancer Brother     brain  . Cancer Sister     brain    History   Social History  . Marital Status: Widowed    Spouse Name: N/A    Number of Children: N/A  . Years of Education: N/A   Occupational History  . Not on file.   Social History Main Topics  . Smoking status: Never Smoker   . Smokeless tobacco: Not on file  . Alcohol Use: No  . Drug Use:  No  . Sexual Activity: Not on file   Other Topics Concern  . Not on file   Social History Narrative  . No narrative on file    Review of systems: The patient specifically denies any chest pain at rest or with exertion, dyspnea at rest, orthopnea, paroxysmal nocturnal dyspnea, syncope, palpitations, focal neurological deficits, intermittent claudication, lower extremity edema, unexplained weight gain, cough, hemoptysis or wheezing.  The patient also denies abdominal pain, nausea, vomiting, dysphagia, diarrhea, constipation, polyuria, polydipsia, dysuria, hematuria, frequency, urgency, abnormal bleeding or bruising, fever, chills, unexpected weight changes, mood swings, change in skin or hair texture, change in voice quality, auditory or visual problems, allergic reactions or rashes, new musculoskeletal complaints other than usual "aches and  pains".   PHYSICAL EXAM BP 142/76  Pulse 70  Resp 20  Ht 5' (1.524 m)  Wt 148 lb (67.132 kg)  BMI 28.9 kg/m2  General: Alert, oriented x3, no distress Head: no evidence of trauma, PERRL, EOMI, no exophtalmos or lid lag, no myxedema, no xanthelasma; normal ears, nose and oropharynx Neck: normal jugular venous pulsations and no hepatojugular reflux; brisk carotid pulses without delay and no carotid bruits Chest: clear to auscultation, no signs of consolidation by percussion or palpation, normal fremitus, symmetrical and full respiratory excursions Cardiovascular: normal position and quality of the apical impulse, regular rhythm, normal first and second heart sounds, no murmurs, rubs or gallops Abdomen: no tenderness or distention, no masses by palpation, no abnormal pulsatility or arterial bruits, normal bowel sounds, no hepatosplenomegaly Extremities: no clubbing, cyanosis or edema; 2+ radial, ulnar and brachial pulses bilaterally; 2+ right femoral, posterior tibial and dorsalis pedis pulses; 2+ left femoral, posterior tibial and dorsalis pedis pulses; no subclavian or femoral bruits Neurological: grossly nonfocal   EKG: Normal sinus rhythm nonspecific repolarization abnormalities  Lipid Panel  No results found for this basename: chol, trig, hdl, cholhdl, vldl, ldlcalc    BMET    Component Value Date/Time   NA 137 02/03/2013 0807   K 4.7 02/03/2013 0807   CL 102 02/03/2013 0807   CO2 26 02/03/2013 0807   GLUCOSE 109* 02/03/2013 0807   BUN 23 02/03/2013 0807   CREATININE 1.14* 02/03/2013 0807   CALCIUM 9.6 02/03/2013 0807   GFRNONAA 44* 02/03/2013 0807   GFRAA 50* 02/03/2013 0807     ASSESSMENT AND PLAN Chronic diastolic heart failure She seems to have recurrent symptoms of diastolic heart failure without overt hypervolemia. Have recommended that she start taking furosemide 20 mg every day. Her carvedilol should be taken 12.5 mg twice a day. Her blood pressure is slightly high. Will  reevaluate her echocardiogram to see if there is objective evidence of increased filling pressures.   CAD (coronary artery disease) At this point in time the suspicion for coronary etiology of her symptoms is low, but might consider a repeat functional study such as a nuclear stress test. Note that in 2007 she had a "false positive" study that suggested anteroseptal ischemia. She actually had an occluded distal posterior descending artery that fills via left to right collaterals but had no disease in the LAD distribution.   DM2 (diabetes mellitus, type 2) Reports "good" glycemic control  Essential hypertension Target BP is less than 130/80 secondary to the presence of diabetes, but caution with excessive BP control is advised in this octogenarian.  Hyperlipidemia     Orders Placed This Encounter  Procedures  . EKG 12-Lead  . 2D Echocardiogram with contrast   Meds ordered this encounter  Medications  . furosemide (LASIX) 20 MG tablet    Sig: Take 1 tablet (20 mg total) by mouth daily.    Dispense:  90 tablet    Refill:  3  . carvedilol (COREG) 25 MG tablet    Sig: Take 0.5 tablets (12.5 mg total) by mouth 2 (two) times daily with a meal.    Leanette Eutsler  Thurmon Fair, MD, [facility] [phone] office [phone] pager

## 2013-08-07 NOTE — Assessment & Plan Note (Signed)
She seems to have recurrent symptoms of diastolic heart failure without overt hypervolemia. Have recommended that she start taking furosemide 20 mg every day. Her carvedilol should be taken 12.5 mg twice a day. Her blood pressure is slightly high. Will reevaluate her echocardiogram to see if there is objective evidence of increased filling pressures.

## 2013-08-16 ENCOUNTER — Encounter: Payer: Self-pay | Admitting: *Deleted

## 2013-08-21 ENCOUNTER — Telehealth: Payer: Self-pay | Admitting: Cardiovascular Disease

## 2013-08-21 ENCOUNTER — Encounter: Payer: Self-pay | Admitting: Cardiovascular Disease

## 2013-08-21 ENCOUNTER — Ambulatory Visit (INDEPENDENT_AMBULATORY_CARE_PROVIDER_SITE_OTHER): Payer: Medicare Other | Admitting: Cardiovascular Disease

## 2013-08-21 VITALS — BP 154/89 | HR 80 | Resp 16 | Ht 60.0 in | Wt 149.3 lb

## 2013-08-21 DIAGNOSIS — I2510 Atherosclerotic heart disease of native coronary artery without angina pectoris: Secondary | ICD-10-CM

## 2013-08-21 DIAGNOSIS — I5032 Chronic diastolic (congestive) heart failure: Secondary | ICD-10-CM

## 2013-08-21 DIAGNOSIS — I10 Essential (primary) hypertension: Secondary | ICD-10-CM | POA: Diagnosis not present

## 2013-08-21 MED ORDER — AMLODIPINE BESYLATE 5 MG PO TABS: 10.0000 mg | ORAL_TABLET | Freq: Every day | ORAL | Status: DC

## 2013-08-21 NOTE — Telephone Encounter (Signed)
Question about E-Script Amliodipine.

## 2013-08-21 NOTE — Patient Instructions (Addendum)
Your physician recommends that you schedule a follow-up appointment in: 2-3 months (PA/NP) Your physician has recommended you make the following change in your medication: Increase amlodipine to 10 mg daily

## 2013-08-21 NOTE — Assessment & Plan Note (Signed)
Although she subjectively improved with diuretic therapy there has been no real change in her weight. The echocardiogram shows findings suggestive of possible volume overload, but these are by no means definitive. Right atrial pressures appear to be normal both by physical exam and echo. I don't think adding more diuretic will necessarily be beneficial. We definitely need to do better with her blood pressure control.

## 2013-08-21 NOTE — Assessment & Plan Note (Signed)
Target BP around 130/80 mm Hg. Increase amlodipine to 10 mg daily. Reevaluate once more before the end of the year.

## 2013-08-21 NOTE — Assessment & Plan Note (Addendum)
He has a known occlusion of the distal segment of the posterior descending artery filling via collaterals. In 2007 coronary driver she was performed because of a "false positive" nuclear study suggesting anteroseptal ischemia. No LAD disease is seen at that time. She does not have angina pectoris.

## 2013-08-21 NOTE — Progress Notes (Signed)
Patient ID: Robin Guzman, female   DOB: 1928-10-15, 79 y.o.   MRN: [id number]     Reason for office visit Diastolic heart failure, hypertension  Robin Guzman returns in followup after initiation of diuretic therapy and echocardiography. Her echo confirms findings consistent with diastolic dysfunction but showed equivocal evidence of volume overload. Her empirically with small and there was no evidence of high right sided filling pressures. The maximum pulmonary artery pressure was estimated at about 30 mm Hg. The E/e' ratio was around 11, equivocal for evidence of elevated left atrial pressure.  When we contacted her by phone a week or 2 ago she said that the diuretics "had helped" but evaluation today shows virtually no change in her weight and she is no longer so confident that there has been any improvement in her shortness of breath. Her dyspnea is mild. She has noticed that she lacks behind her friends when they go out for lunch her shopping.  Her blood pressure is markedly elevated today.   Allergies  Allergen Reactions  . Advil [Ibuprofen] Other (See Comments)    weakness  . Daypro [Oxaprozin] Other (See Comments)    weakness    Current Outpatient Prescriptions  Medication Sig Dispense Refill  . amLODipine (NORVASC) 5 MG tablet Take 2 tablets (10 mg total) by mouth daily.  30 tablet  6  . aspirin EC 81 MG tablet Take 81 mg by mouth daily.      [name] atorvastatin (LIPITOR) 20 MG tablet Take 20 mg by mouth daily.      . carvedilol (COREG) 25 MG tablet Take 0.5 tablets (12.5 mg total) by mouth 2 (two) times daily with a meal.      . Choline Fenofibrate (TRILIPIX) 135 MG capsule Take 135 mg by mouth daily.      . enalapril (VASOTEC) 20 MG tablet Take 20 mg by mouth daily.      [name] esomeprazole (NEXIUM) 40 MG capsule Take 40 mg by mouth daily as needed (for hearburn).       . furosemide (LASIX) 20 MG tablet Take 1 tablet (20 mg total) by mouth daily.  90 tablet  3  . glipiZIDE  (GLUCOTROL XL) 2.5 MG 24 hr tablet Take 2.5 mg by mouth daily.      . metFORMIN (GLUCOPHAGE) 500 MG tablet Take 500 mg by mouth 2 (two) times daily.       No current facility-administered medications for this visit.    Past Medical History  Diagnosis Date  . Diabetes mellitus without complication   . CHF (congestive heart failure)   . Hypertension   . Hypercholesteremia   . Dyspnea on exertion   . Hx of echocardiogram 04/09/2011    EF 55% Mildly hypertrophic left ventricle with normal systolic function, Moderate (grade II) diastolic dysfunction. Elevated left atrial pressure. No significant valvular abnormalities. No pericardial effusion. Mild to moderate pulmonary arterial hypertension.  [name] History of stress test 07/05/2006    High risk scan cardiac cathe was recommended.  . Dyslipidemia     Past Surgical History  Procedure Laterality Date  . Abdominal hysterectomy      Family History  Problem Relation Age of Onset  . Cancer Mother   . Heart Problems Father   . Cancer Brother     brain  . Cancer Sister     brain    History   Social History  . Marital Status: Widowed    Spouse Name: N/A    Number of  Children: N/A  . Years of Education: N/A   Occupational History  . Not on file.   Social History Main Topics  . Smoking status: Never Smoker   . Smokeless tobacco: Not on file  . Alcohol Use: No  . Drug Use: No  . Sexual Activity: Not on file   Other Topics Concern  . Not on file   Social History Narrative  . No narrative on file    Review of systems: The patient specifically denies any chest pain at rest or with exertion, dyspnea at rest, orthopnea, paroxysmal nocturnal dyspnea, syncope, palpitations, focal neurological deficits, intermittent claudication, lower extremity edema, unexplained weight gain, cough, hemoptysis or wheezing.  The patient also denies abdominal pain, nausea, vomiting, dysphagia, diarrhea, constipation, polyuria, polydipsia, dysuria,  hematuria, frequency, urgency, abnormal bleeding or bruising, fever, chills, unexpected weight changes, mood swings, change in skin or hair texture, change in voice quality, auditory or visual problems, allergic reactions or rashes, new musculoskeletal complaints other than usual "aches and pains".   PHYSICAL EXAM Initially her blood pressure was 170/80 mm Hg, heart rate 80 BP 154/89  Pulse 80  Resp 16  Ht 5' (1.524 m)  Wt 149 lb 4.8 oz (67.722 kg)  BMI 29.16 kg/m2  General: Alert, oriented x3, no distress Head: no evidence of trauma, PERRL, EOMI, no exophtalmos or lid lag, no myxedema, no xanthelasma; normal ears, nose and oropharynx Neck: normal jugular venous pulsations and no hepatojugular reflux; brisk carotid pulses without delay and no carotid bruits Chest: clear to auscultation, no signs of consolidation by percussion or palpation, normal fremitus, symmetrical and full respiratory excursions Cardiovascular: normal position and quality of the apical impulse, regular rhythm, normal first and second heart sounds, no murmurs, rubs or gallops Abdomen: no tenderness or distention, no masses by palpation, no abnormal pulsatility or arterial bruits, normal bowel sounds, no hepatosplenomegaly Extremities: no clubbing, cyanosis or edema; 2+ radial, ulnar and brachial pulses bilaterally; 2+ right femoral, posterior tibial and dorsalis pedis pulses; 2+ left femoral, posterior tibial and dorsalis pedis pulses; no subclavian or femoral bruits Neurological: grossly nonfocal   BMET    Component Value Date/Time   NA 137 02/03/2013 0807   K 4.7 02/03/2013 0807   CL 102 02/03/2013 0807   CO2 26 02/03/2013 0807   GLUCOSE 109* 02/03/2013 0807   BUN 23 02/03/2013 0807   CREATININE 1.14* 02/03/2013 0807   CALCIUM 9.6 02/03/2013 0807   GFRNONAA 44* 02/03/2013 0807   GFRAA 50* 02/03/2013 0807     ASSESSMENT AND PLAN Chronic diastolic heart failure Although she subjectively improved with diuretic therapy  there has been no real change in her weight. The echocardiogram shows findings suggestive of possible volume overload, but these are by no means definitive. Right atrial pressures appear to be normal both by physical exam and echo. I don't think adding more diuretic will necessarily be beneficial. We definitely need to do better with her blood pressure control.  Essential hypertension Target BP around 130/80 mm Hg. Increase amlodipine to 10 mg daily. Reevaluate once more before the end of the year.  CAD (coronary artery disease) He has a known occlusion of the distal segment of the posterior descending artery filling via collaterals. In 2007 coronary driver she was performed because of a "false positive" nuclear study suggesting anteroseptal ischemia. No LAD disease is seen at that time. She does not have angina pectoris.   No orders of the defined types were placed in this encounter.   Meds  ordered this encounter  Medications  . amLODipine (NORVASC) 5 MG tablet    Sig: Take 2 tablets (10 mg total) by mouth daily.    Dispense:  30 tablet    Refill:  6    Denielle Bayard  Thurmon Fair, MD, [facility] [phone] office [phone] pager

## 2013-10-30 ENCOUNTER — Encounter: Payer: Self-pay | Admitting: Cardiovascular Disease

## 2013-10-30 ENCOUNTER — Ambulatory Visit (INDEPENDENT_AMBULATORY_CARE_PROVIDER_SITE_OTHER): Payer: Medicare Other | Admitting: Cardiovascular Disease

## 2013-10-30 VITALS — BP 126/70 | HR 72 | Resp 16 | Ht 60.0 in | Wt 150.1 lb

## 2013-10-30 DIAGNOSIS — I2510 Atherosclerotic heart disease of native coronary artery without angina pectoris: Secondary | ICD-10-CM

## 2013-10-30 DIAGNOSIS — I5032 Chronic diastolic (congestive) heart failure: Secondary | ICD-10-CM | POA: Diagnosis not present

## 2013-10-30 DIAGNOSIS — I10 Essential (primary) hypertension: Secondary | ICD-10-CM

## 2013-10-30 DIAGNOSIS — E785 Hyperlipidemia, unspecified: Secondary | ICD-10-CM

## 2013-10-30 NOTE — Assessment & Plan Note (Signed)
Continue statin therapy.

## 2013-10-30 NOTE — Patient Instructions (Signed)
Dr. Salena Saner  recommends that you schedule a follow-up appointment in: One Year.

## 2013-10-30 NOTE — Assessment & Plan Note (Signed)
Chronic total occlusion of the distal posterior descending artery which fills via collaterals by angiography in 2007 (note that the study was performed for a "false positive" nuclear stress test that showed anteroseptal ischemia, but the LAD artery was fine). She does not have angina pectoris

## 2013-10-30 NOTE — Progress Notes (Signed)
Patient ID: Robin Guzman, female   DOB: Dec 14, 1927, 79 y.o.   MRN: [id number]      Reason for office visit HTN, chronic diastolic dysfunction/failure  Robin Guzman is doing quite well. She no longer has problems with shortness of breath. She takes care of her all her house work without assistance. She has noticed that she has lightheadedness if she stands up quickly after bending over. It doesn't stop her. She simply scoots around the floor where she dusts her floorboards. She denies chest pain or shortness of breath. Shortness of breath used to be her limiting factor when walking up stairs or a pill, and now the limiting factors like to T.   Allergies  Allergen Reactions  . Advil [Ibuprofen] Other (See Comments)    weakness  . Daypro [Oxaprozin] Other (See Comments)    weakness    Current Outpatient Prescriptions  Medication Sig Dispense Refill  . amLODipine (NORVASC) 5 MG tablet Take 2 tablets (10 mg total) by mouth daily.  30 tablet  6  . aspirin EC 81 MG tablet Take 81 mg by mouth daily.      [name] atorvastatin (LIPITOR) 20 MG tablet Take 20 mg by mouth daily.      . carvedilol (COREG) 25 MG tablet Take 0.5 tablets (12.5 mg total) by mouth 2 (two) times daily with a meal.      . Choline Fenofibrate (TRILIPIX) 135 MG capsule Take 135 mg by mouth daily.      . enalapril (VASOTEC) 20 MG tablet Take 20 mg by mouth daily.      [name] esomeprazole (NEXIUM) 40 MG capsule Take 40 mg by mouth daily as needed (for hearburn).       . furosemide (LASIX) 20 MG tablet Take 20 mg by mouth daily as needed.      [name] glipiZIDE (GLUCOTROL XL) 2.5 MG 24 hr tablet Take 2.5 mg by mouth daily.      . metFORMIN (GLUCOPHAGE) 500 MG tablet Take 500 mg by mouth 2 (two) times daily.       No current facility-administered medications for this visit.    Past Medical History  Diagnosis Date  . Diabetes mellitus without complication   . CHF (congestive heart failure)   . Hypertension   . Hypercholesteremia   .  Dyspnea on exertion   . Hx of echocardiogram 04/09/2011    EF 55% Mildly hypertrophic left ventricle with normal systolic function, Moderate (grade II) diastolic dysfunction. Elevated left atrial pressure. No significant valvular abnormalities. No pericardial effusion. Mild to moderate pulmonary arterial hypertension.  [name] History of stress test 07/05/2006    High risk scan cardiac cathe was recommended.  . Dyslipidemia     Past Surgical History  Procedure Laterality Date  . Abdominal hysterectomy      Family History  Problem Relation Age of Onset  . Cancer Mother   . Heart Problems Father   . Cancer Brother     brain  . Cancer Sister     brain    History   Social History  . Marital Status: Widowed    Spouse Name: N/A    Number of Children: N/A  . Years of Education: N/A   Occupational History  . Not on file.   Social History Main Topics  . Smoking status: Never Smoker   . Smokeless tobacco: Not on file  . Alcohol Use: No  . Drug Use: No  . Sexual Activity: Not on file   Other  Topics Concern  . Not on file   Social History Narrative  . No narrative on file    Review of systems: The patient specifically denies any chest pain at rest or with exertion, dyspnea at rest or with exertion, orthopnea, paroxysmal nocturnal dyspnea, syncope, palpitations, focal neurological deficits, intermittent claudication, lower extremity edema, unexplained weight gain, cough, hemoptysis or wheezing.  The patient also denies abdominal pain, nausea, vomiting, dysphagia, diarrhea, constipation, polyuria, polydipsia, dysuria, hematuria, frequency, urgency, abnormal bleeding or bruising, fever, chills, unexpected weight changes, mood swings, change in skin or hair texture, change in voice quality, auditory or visual problems, allergic reactions or rashes, new musculoskeletal complaints other than usual "aches and pains".   PHYSICAL EXAM BP 126/70  Pulse 72  Resp 16  Ht 5' (1.524 m)  Wt  150 lb 1.6 oz (68.085 kg)  BMI 29.31 kg/m2  General: Alert, oriented x3, no distress Head: no evidence of trauma, PERRL, EOMI, no exophtalmos or lid lag, no myxedema, no xanthelasma; normal ears, nose and oropharynx Neck: normal jugular venous pulsations and no hepatojugular reflux; brisk carotid pulses without delay and no carotid bruits Chest: clear to auscultation, no signs of consolidation by percussion or palpation, normal fremitus, symmetrical and full respiratory excursions Cardiovascular: normal position and quality of the apical impulse, regular rhythm, normal first and second heart sounds, no murmurs, rubs or gallops Abdomen: no tenderness or distention, no masses by palpation, no abnormal pulsatility or arterial bruits, normal bowel sounds, no hepatosplenomegaly Extremities: no clubbing, cyanosis or edema; 2+ radial, ulnar and brachial pulses bilaterally; 2+ right femoral, posterior tibial and dorsalis pedis pulses; 2+ left femoral, posterior tibial and dorsalis pedis pulses; no subclavian or femoral bruits Neurological: grossly nonfocal   BMET    Component Value Date/Time   NA 137 02/03/2013 0807   K 4.7 02/03/2013 0807   CL 102 02/03/2013 0807   CO2 26 02/03/2013 0807   GLUCOSE 109* 02/03/2013 0807   BUN 23 02/03/2013 0807   CREATININE 1.14* 02/03/2013 0807   CALCIUM 9.6 02/03/2013 0807   GFRNONAA 44* 02/03/2013 0807   GFRAA 50* 02/03/2013 0807     ASSESSMENT AND PLAN Essential hypertension She now has excellent blood pressure control. No further adjustments are plantar her medications.  Chronic diastolic heart failure NYHA functional class I. No clinical signs of hypervolemia. No changes are planned her medications. She is reminded of the importance of a sodium restricted diet.  CAD (coronary artery disease) Chronic total occlusion of the distal posterior descending artery which fills via collaterals by angiography in 2007 (note that the study was performed for a "false  positive" nuclear stress test that showed anteroseptal ischemia, but the LAD artery was fine). She does not have angina pectoris  Hyperlipidemia Continue statin therapy   Meds ordered this encounter  Medications  . furosemide (LASIX) 20 MG tablet    Sig: Take 20 mg by mouth daily as needed.    Junious Silk, MD, [facility] [phone] office [phone] pager

## 2013-10-30 NOTE — Assessment & Plan Note (Signed)
She now has excellent blood pressure control. No further adjustments are plantar her medications.

## 2013-10-30 NOTE — Assessment & Plan Note (Signed)
NYHA functional class I. No clinical signs of hypervolemia. No changes are planned her medications. She is reminded of the importance of a sodium restricted diet.

## 2013-12-16 ENCOUNTER — Emergency Department (HOSPITAL_COMMUNITY): Payer: Medicare Other

## 2013-12-16 ENCOUNTER — Other Ambulatory Visit: Payer: Self-pay

## 2013-12-16 ENCOUNTER — Observation Stay (HOSPITAL_COMMUNITY)
Admission: EM | Admit: 2013-12-16 | Discharge: 2013-12-17 | Disposition: A | Discharge status: Home / Self Care | Payer: Medicare Other | Attending: Family Medicine | Admitting: Family Medicine

## 2013-12-16 ENCOUNTER — Encounter (HOSPITAL_COMMUNITY): Payer: Self-pay | Admitting: Emergency Medicine

## 2013-12-16 DIAGNOSIS — I517 Cardiomegaly: Secondary | ICD-10-CM

## 2013-12-16 DIAGNOSIS — I129 Hypertensive chronic kidney disease with stage 1 through stage 4 chronic kidney disease, or unspecified chronic kidney disease: Secondary | ICD-10-CM | POA: Diagnosis not present

## 2013-12-16 DIAGNOSIS — R0600 Dyspnea, unspecified: Secondary | ICD-10-CM

## 2013-12-16 DIAGNOSIS — R079 Chest pain, unspecified: Secondary | ICD-10-CM

## 2013-12-16 DIAGNOSIS — E119 Type 2 diabetes mellitus without complications: Secondary | ICD-10-CM | POA: Diagnosis not present

## 2013-12-16 DIAGNOSIS — I4891 Unspecified atrial fibrillation: Secondary | ICD-10-CM | POA: Diagnosis not present

## 2013-12-16 DIAGNOSIS — R0602 Shortness of breath: Secondary | ICD-10-CM | POA: Diagnosis not present

## 2013-12-16 DIAGNOSIS — N183 Chronic kidney disease, stage 3 unspecified: Secondary | ICD-10-CM | POA: Insufficient documentation

## 2013-12-16 DIAGNOSIS — I2510 Atherosclerotic heart disease of native coronary artery without angina pectoris: Secondary | ICD-10-CM | POA: Insufficient documentation

## 2013-12-16 DIAGNOSIS — I509 Heart failure, unspecified: Secondary | ICD-10-CM | POA: Diagnosis not present

## 2013-12-16 DIAGNOSIS — I5032 Chronic diastolic (congestive) heart failure: Secondary | ICD-10-CM | POA: Diagnosis not present

## 2013-12-16 DIAGNOSIS — I10 Essential (primary) hypertension: Secondary | ICD-10-CM

## 2013-12-16 HISTORY — DX: Acute myocardial infarction, unspecified: I21.9

## 2013-12-16 LAB — URINE MICROSCOPIC-ADD ON

## 2013-12-16 LAB — CBC WITH DIFFERENTIAL/PLATELET
BASOS ABS: 0 10*3/uL (ref 0.0–0.1)
BASOS PCT: 1 % (ref 0–1)
Eosinophils Absolute: 0.1 10*3/uL (ref 0.0–0.7)
Eosinophils Relative: 4 % (ref 0–5)
HCT: 36.7 % (ref 36.0–46.0)
Hemoglobin: 12 g/dL (ref 12.0–15.0)
Lymphocytes Relative: 18 % (ref 12–46)
Lymphs Abs: 0.7 10*3/uL (ref 0.7–4.0)
MCH: 29.9 pg (ref 26.0–34.0)
MCHC: 32.7 g/dL (ref 30.0–36.0)
MCV: 91.5 fL (ref 78.0–100.0)
MONO ABS: 0.3 10*3/uL (ref 0.1–1.0)
Monocytes Relative: 9 % (ref 3–12)
NEUTROS ABS: 2.6 10*3/uL (ref 1.7–7.7)
NEUTROS PCT: 69 % (ref 43–77)
Platelets: 199 10*3/uL (ref 150–400)
RBC: 4.01 MIL/uL (ref 3.87–5.11)
RDW: 14.1 % (ref 11.5–15.5)
WBC: 3.7 10*3/uL — ABNORMAL LOW (ref 4.0–10.5)

## 2013-12-16 LAB — GLUCOSE, CAPILLARY
GLUCOSE-CAPILLARY: 94 mg/dL (ref 70–99)
Glucose-Capillary: 103 mg/dL — ABNORMAL HIGH (ref 70–99)
Glucose-Capillary: 120 mg/dL — ABNORMAL HIGH (ref 70–99)

## 2013-12-16 LAB — URINALYSIS, ROUTINE W REFLEX MICROSCOPIC
BILIRUBIN URINE: NEGATIVE
GLUCOSE, UA: NEGATIVE mg/dL
KETONES UR: NEGATIVE mg/dL
Leukocytes, UA: NEGATIVE
Nitrite: NEGATIVE
Protein, ur: NEGATIVE mg/dL
Specific Gravity, Urine: 1.02 (ref 1.005–1.030)
Urobilinogen, UA: 1 mg/dL (ref 0.0–1.0)
pH: 6 (ref 5.0–8.0)

## 2013-12-16 LAB — BASIC METABOLIC PANEL
BUN: 22 mg/dL (ref 6–23)
CHLORIDE: 100 meq/L (ref 96–112)
CO2: 27 mEq/L (ref 19–32)
CREATININE: 1.41 mg/dL — AB (ref 0.50–1.10)
Calcium: 9.6 mg/dL (ref 8.4–10.5)
GFR, EST AFRICAN AMERICAN: 39 mL/min — AB (ref >90)
GFR, EST NON AFRICAN AMERICAN: 33 mL/min — AB (ref >90)
Glucose, Bld: 227 mg/dL — ABNORMAL HIGH (ref 70–99)
POTASSIUM: 4.5 meq/L (ref 3.7–5.3)
Sodium: 138 mEq/L (ref 137–147)

## 2013-12-16 LAB — TROPONIN I
Troponin I: <0.3 ng/mL (ref <0.30)
Troponin I: <0.3 ng/mL (ref <0.30)
Troponin I: <0.3 ng/mL (ref <0.30)

## 2013-12-16 LAB — PRO B NATRIURETIC PEPTIDE: Pro B Natriuretic peptide (BNP): 212.6 pg/mL (ref 0–450)

## 2013-12-16 LAB — TSH: TSH: 1.042 u[IU]/mL (ref 0.350–4.500)

## 2013-12-16 MED ORDER — ENALAPRIL MALEATE 5 MG PO TABS
20.0000 mg | ORAL_TABLET | Freq: Every day | ORAL | Status: DC
  Administered 2013-12-17: 20 mg via ORAL
  Filled 2013-12-16: qty 4

## 2013-12-16 MED ORDER — ENOXAPARIN SODIUM 40 MG/0.4ML ~~LOC~~ SOLN
40.0000 mg | SUBCUTANEOUS | Status: DC
  Administered 2013-12-16 – 2013-12-17 (×2): 40 mg via SUBCUTANEOUS
  Filled 2013-12-16 (×2): qty 0.4

## 2013-12-16 MED ORDER — INSULIN ASPART 100 UNIT/ML ~~LOC~~ SOLN: 0.0000 [IU] | Freq: Three times a day (TID) | SUBCUTANEOUS | Status: DC

## 2013-12-16 MED ORDER — CARVEDILOL 12.5 MG PO TABS
12.5000 mg | ORAL_TABLET | Freq: Two times a day (BID) | ORAL | Status: DC
  Administered 2013-12-16 – 2013-12-17 (×2): 12.5 mg via ORAL
  Filled 2013-12-16 (×2): qty 1

## 2013-12-16 MED ORDER — ASPIRIN EC 81 MG PO TBEC
81.0000 mg | DELAYED_RELEASE_TABLET | Freq: Every day | ORAL | Status: DC
  Administered 2013-12-17: 81 mg via ORAL
  Filled 2013-12-16: qty 1

## 2013-12-16 MED ORDER — PANTOPRAZOLE SODIUM 40 MG PO TBEC
40.0000 mg | DELAYED_RELEASE_TABLET | Freq: Every day | ORAL | Status: DC
  Administered 2013-12-16 – 2013-12-17 (×2): 40 mg via ORAL
  Filled 2013-12-16 (×2): qty 1

## 2013-12-16 MED ORDER — ATORVASTATIN CALCIUM 20 MG PO TABS
20.0000 mg | ORAL_TABLET | Freq: Every day | ORAL | Status: DC
  Administered 2013-12-16 – 2013-12-17 (×2): 20 mg via ORAL
  Filled 2013-12-16 (×2): qty 1

## 2013-12-16 MED ORDER — ONDANSETRON HCL 4 MG/2ML IJ SOLN: 4.0000 mg | Freq: Three times a day (TID) | INTRAMUSCULAR | Status: DC | PRN

## 2013-12-16 MED ORDER — AMLODIPINE BESYLATE 5 MG PO TABS
10.0000 mg | ORAL_TABLET | Freq: Every day | ORAL | Status: DC
  Administered 2013-12-17: 10 mg via ORAL
  Filled 2013-12-16: qty 2

## 2013-12-16 MED ORDER — ONDANSETRON HCL 4 MG/2ML IJ SOLN: 4.0000 mg | Freq: Four times a day (QID) | INTRAMUSCULAR | Status: DC | PRN

## 2013-12-16 MED ORDER — ACETAMINOPHEN 325 MG PO TABS: 650.0000 mg | ORAL_TABLET | ORAL | Status: DC | PRN

## 2013-12-16 MED ORDER — ASPIRIN 81 MG PO CHEW
324.0000 mg | CHEWABLE_TABLET | Freq: Once | ORAL | Status: AC
  Administered 2013-12-16: 324 mg via ORAL
  Filled 2013-12-16: qty 4

## 2013-12-16 NOTE — ED Notes (Signed)
Admitting MD at bedside.

## 2013-12-16 NOTE — H&P (Signed)
History and Physical  RUPALI GONYA [id number] DOB: [birth year] DOA: 12/16/2013  Referring physician: Ezequiel Essex, MD in ED PCP: Delphina Cahill, MD   Chief Complaint: weak legs  HPI:  80 year old woman presented to the emergency department with episode of generalized weakness, shortness of breath and possible chest pain this morning. Initial evaluation was unremarkable except for the possibility of atrial fibrillation on initial EKG. Because of report of chest pain and possible atrial fibrillation patient was referred for observation.  Patient reports feeling fine yesterday. Today she was fixing breakfast when she became short of breath and felt acutely weak. Bilateral lower extremity generalized weakness, she was able to sit down without falling. She denies having any chest pain whenever, although initial notation in the emergency department reports chest pain. Currently she is pain free and asymptomatic. She has a history of coronary artery disease, chronic diastolic heart failure, followed by cardiology with last office visit 10/2013 and at that time was noted to be doing very well with an NYHA functional class I. Coronary artery disease was felt to be stable (Chronic total occlusion of the distal posterior descending artery which fills via collaterals by angiography in 2007) she was continued on Lasix, aspirin, Coreg, Lipitor.  In the emergency department noted to be afebrile with VSS. Basic metabolic panel of mild elevation of creatinine 1.41, troponin negative, BNP normal, CBC unremarkable. Chest x-ray unremarkable. Initial EKG by my interpretation shows sinus rhythm as predominant rhythm with either supraventricular ectopy or very transient atrial fibrillation. Supraventricular ectopy is favored. Second EKG shows sinus rhythm with no acute changes.  Review of Systems:  Negative for fever, visual changes, sore throat, rash, new muscle aches, dysuria, bleeding, n/v/abdominal pain.  Past  Medical History  Diagnosis Date  . Diabetes mellitus without complication   . CHF (congestive heart failure)   . Hypertension   . Hypercholesteremia   . Dyspnea on exertion   . Hx of echocardiogram 04/09/2011    EF 55% Mildly hypertrophic left ventricle with normal systolic function, Moderate (grade II) diastolic dysfunction. Elevated left atrial pressure. No significant valvular abnormalities. No pericardial effusion. Mild to moderate pulmonary arterial hypertension.  [name] History of stress test 07/05/2006    High risk scan cardiac cathe was recommended.  . Dyslipidemia   . Myocardial infarct     Past Surgical History  Procedure Laterality Date  . Abdominal hysterectomy      Social History:  reports that she has never smoked. She does not have any smokeless tobacco history on file. She reports that she does not drink alcohol or use illicit drugs.  Allergies  Allergen Reactions  . Advil [Ibuprofen] Other (See Comments)    weakness  . Daypro [Oxaprozin] Other (See Comments)    weakness    Family History  Problem Relation Age of Onset  . Cancer Mother   . Heart Problems Father   . Cancer Brother     brain  . Cancer Sister     brain     Prior to Admission medications   Medication Sig Start Date End Date Taking? Authorizing Provider  amLODipine (NORVASC) 5 MG tablet Take 2 tablets (10 mg total) by mouth daily. 08/21/13   Mihai Croitoru, MD  aspirin EC 81 MG tablet Take 81 mg by mouth daily.    Historical Provider, MD  atorvastatin (LIPITOR) 20 MG tablet Take 20 mg by mouth daily.    Historical Provider, MD  carvedilol (COREG) 25 MG tablet Take 0.5 tablets (12.5 mg  total) by mouth 2 (two) times daily with a meal. 07/24/13   Mihai Croitoru, MD  Choline Fenofibrate (TRILIPIX) 135 MG capsule Take 135 mg by mouth daily.    Historical Provider, MD  enalapril (VASOTEC) 20 MG tablet Take 20 mg by mouth daily.    Historical Provider, MD  esomeprazole (NEXIUM) 40 MG capsule Take 40 mg by  mouth daily as needed (for hearburn).     Historical Provider, MD  furosemide (LASIX) 20 MG tablet Take 20 mg by mouth daily as needed. 07/24/13   Mihai Croitoru, MD  glipiZIDE (GLUCOTROL XL) 2.5 MG 24 hr tablet Take 2.5 mg by mouth daily.    Historical Provider, MD  metFORMIN (GLUCOPHAGE) 500 MG tablet Take 500 mg by mouth 2 (two) times daily.    Historical Provider, MD   Physical Exam: Filed Vitals:   12/16/13 0750 12/16/13 0800  BP: 159/68 161/68  Pulse: 86 84  Temp: 97.9 F (36.6 C)   TempSrc: Oral   Resp: 18 20  Height: 5' (1.524 m)   SpO2: 96%    General: Examined in the emergency department. Appears calm and comfortable Eyes: PERRL, normal lids, irises  ENT: grossly normal hearing, lips & tongue Neck: no LAD, masses or thyromegaly Cardiovascular: RRR, no m/r/g. No LE edema. Respiratory: CTA bilaterally, no w/r/r. Normal respiratory effort. Abdomen: soft, ntnd Skin: no rash or induration seen Musculoskeletal: grossly normal tone BUE/BLE Psychiatric: grossly normal mood and affect, speech fluent and appropriate Neurologic: grossly non-focal.  Wt Readings from Last 3 Encounters:  10/30/13 68.085 kg (150 lb 1.6 oz)  08/21/13 67.722 kg (149 lb 4.8 oz)  07/24/13 67.132 kg (148 lb)    Labs on Admission:  Basic Metabolic Panel:  Recent Labs Lab 12/16/13 0755  NA 138  K 4.5  CL 100  CO2 27  GLUCOSE 227*  BUN 22  CREATININE 1.41*  CALCIUM 9.6    CBC:  Recent Labs Lab 12/16/13 0755  WBC 3.7*  NEUTROABS 2.6  HGB 12.0  HCT 36.7  MCV 91.5  PLT 199    Cardiac Enzymes:  Recent Labs Lab 12/16/13 0755  TROPONINI <0.30     Recent Labs  12/16/13 0755  PROBNP 212.6      Radiological Exams on Admission: Dg Chest Portable 1 View  12/16/2013   CLINICAL DATA:  Chest pain. Coronary artery disease. Previous myocardial infarct. Chronic diastolic heart failure.  EXAM: PORTABLE CHEST - 1 VIEW  COMPARISON:  02/03/2013  FINDINGS: Cardiomegaly and ectasia of the  thoracic aorta are stable. Both lungs are clear. No evidence of pleural effusion.  IMPRESSION: Stable cardiomegaly.  No active lung disease.   Electronically Signed   By: Earle Gell M.D.   On: 12/16/2013 08:28    EKG: Independently reviewed. As above   Principal Problem:   Chest pain Active Problems:   Chronic diastolic heart failure   DM2 (diabetes mellitus, type 2)   CAD (coronary artery disease)   Atrial fibrillation   Assessment/Plan 1. Shortness of breath prior to admission. Resolved. Etiology unclear, no history of COPD, no evidence of infection, history does not suggest ACS, EKG reassuring and troponin negative. BNP normal, no findings to suggest CHF. Possibly related to transient arrhythmia/atrial fibrillation. Well's score = 0 and no history to suggest VTE. 2. Possible chest pain. Heart score 4 (age and hx). 3. Possible atrial fibrillation/supraventricular arrhythmia. Currently in sinus rhythm. 4. Possible acute renal failure versus chronic kidney disease stage III.  5. Diabetes mellitus type  2. Appears stable. 6. Chronic diastolic congestive heart failure, NYHA class I. Appears well compensated. 7. History of coronary artery disease as detailed above. 8. Hypertension. Stable. 9. Hyperlipidemia.   Patient appears stable for admission to telemetry for overnight observation.  Plan 2-D echocardiogram, serial troponin, repeat EKG in the morning, telemetry to observe for arrhythmia. TSH. Continue aspirin.  Given that atrial fibrillation is not clearly documented this point, no anti-coagulation.  Hold diuretics for today. Repeat basic metabolic panel the morning.  Sliding-scale insulin. Hold metformin and glipizide while hospitalized.  Continue antihypertensives.  Check orthostatics.  Above discussed with 2 daughters at bedside  Code Status: confirmed full code  DVT prophylaxis: Lovenox Family Communication:  Disposition Plan/Anticipated LOS: obs, <24 hours  Time  spent: 99 minutes  Murray Hodgkins, MD  [facility] Pager [phone] 12/16/2013, 9:55 AM

## 2013-12-16 NOTE — ED Provider Notes (Signed)
CSN: [id number]     Arrival date & time 12/16/13  1517 History  This chart was scribed for Ezequiel Essex, MD by Roxan Diesel, ED scribe.  This patient was seen in room APA18/APA18 and the patient's care was started at 7:53 AM.   Chief Complaint  Patient presents with  . Chest Pain    The history is provided by the patient and a relative. No language interpreter was used.    HPI Comments: Robin Guzman is a 80 y.o. female with h/o MI (per daughter), CHF, DM, HTN, and hypercholesteremia who presents to the Emergency Department complaining of intermittent CP with associated SOB and generalized weakness that began this morning.  Pt states she felt well on waking this morning.  This morning at around 7:20 AM she called her son telling him she "didn't feel good."  Since then she has been complaining of intermittent central chest pain radiating into her right arm.  Currently she denies CP or chest pressure but states she does feel SOB and generally weak.  She denies dizziness, back pain, abdominal pain, vomiting or diarrhea.  Daughter stents pt has had an MI but she has not had stents placed.  She denies her having h/o A-fib to her knowledge.      Past Medical History  Diagnosis Date  . Diabetes mellitus without complication   . CHF (congestive heart failure)   . Hypertension   . Hypercholesteremia   . Dyspnea on exertion   . Hx of echocardiogram 04/09/2011    EF 55% Mildly hypertrophic left ventricle with normal systolic function, Moderate (grade II) diastolic dysfunction. Elevated left atrial pressure. No significant valvular abnormalities. No pericardial effusion. Mild to moderate pulmonary arterial hypertension.  [name] History of stress test 07/05/2006    High risk scan cardiac cathe was recommended.  . Dyslipidemia   . Myocardial infarct     Past Surgical History  Procedure Laterality Date  . Abdominal hysterectomy      Family History  Problem Relation Age of Onset  . Cancer  Mother   . Heart Problems Father   . Cancer Brother     brain  . Cancer Sister     brain    History  Substance Use Topics  . Smoking status: Never Smoker   . Smokeless tobacco: Not on file  . Alcohol Use: No    OB History   Grav Para Term Preterm Abortions TAB SAB Ect Mult Living                  Review of Systems A complete 10 system review of systems was obtained and all systems are negative except as noted in the HPI and PMH.    Allergies  Advil and Daypro  Home Medications   No current outpatient prescriptions on file. BP 159/68  Pulse 86  Temp(Src) 97.9 F (36.6 C) (Oral)  Resp 18  Ht 5' (1.524 m)  SpO2 96%  Physical Exam  Nursing note and vitals reviewed. Constitutional: She is oriented to person, place, and time. She appears well-developed and well-nourished. No distress.  HENT:  Head: Normocephalic and atraumatic.  Mouth/Throat: Oropharynx is clear and moist. No oropharyngeal exudate.  Eyes: EOM are normal. Pupils are equal, round, and reactive to light.  Neck: Normal range of motion. Neck supple. No tracheal deviation present.  Cardiovascular: Normal rate and intact distal pulses.  An irregularly irregular rhythm present.  No murmur heard. Pulmonary/Chest: Effort normal and breath sounds normal. No respiratory  distress. She has no wheezes. She has no rales.  Abdominal: Soft. There is no tenderness.  Musculoskeletal: Normal range of motion. She exhibits no edema.  Neurological: She is alert and oriented to person, place, and time. No cranial nerve deficit. She exhibits normal muscle tone. Coordination normal.  Global weakness  Skin: Skin is warm and dry.  Psychiatric: She has a normal mood and affect. Her behavior is normal.    ED Course  Procedures (including critical care time)  DIAGNOSTIC STUDIES: Oxygen Saturation is 96% on room air, normal by my interpretation.    COORDINATION OF CARE: 8:01 AM-Discussed treatment plan which includes  aspirin, EKG, CXR and labs with pt at bedside and pt agreed to plan.     Labs Review Labs Reviewed  CBC WITH DIFFERENTIAL - Abnormal; Notable for the following:    WBC 3.7 (*)    All other components within normal limits  BASIC METABOLIC PANEL - Abnormal; Notable for the following:    Glucose, Bld 227 (*)    Creatinine, Ser 1.41 (*)    GFR calc non Af Amer 33 (*)    GFR calc Af Amer 39 (*)    All other components within normal limits  URINALYSIS, ROUTINE W REFLEX MICROSCOPIC - Abnormal; Notable for the following:    APPearance HAZY (*)    Hgb urine dipstick TRACE (*)    All other components within normal limits  GLUCOSE, CAPILLARY - Abnormal; Notable for the following:    Glucose-Capillary 103 (*)    All other components within normal limits  URINE MICROSCOPIC-ADD ON - Abnormal; Notable for the following:    Bacteria, UA MANY (*)    All other components within normal limits  URINE CULTURE  TROPONIN I  PRO B NATRIURETIC PEPTIDE  TROPONIN I  TROPONIN I  TSH  TROPONIN I    Imaging Review Dg Chest Portable 1 View  12/16/2013   CLINICAL DATA:  Chest pain. Coronary artery disease. Previous myocardial infarct. Chronic diastolic heart failure.  EXAM: PORTABLE CHEST - 1 VIEW  COMPARISON:  02/03/2013  FINDINGS: Cardiomegaly and ectasia of the thoracic aorta are stable. Both lungs are clear. No evidence of pleural effusion.  IMPRESSION: Stable cardiomegaly.  No active lung disease.   Electronically Signed   By: Earle Gell M.D.   On: 12/16/2013 08:28    EKG Interpretation    Date/Time:  Sunday December 16 2013 09:00:29 EST Ventricular Rate:  72 PR Interval:  174 QRS Duration: 72 QT Interval:  380 QTC Calculation: 416 R Axis:   -14 Text Interpretation:  Normal sinus rhythm Normal ECG When compared with ECG of 16-Dec-2013 07:39, Sinus rhythm has replaced Atrial fibrillation Non-specific change in ST segment in Lateral leads Confirmed by Eryn Krejci  MD, Lamark Schue ([id number]) on 12/16/2013  9:13:27 AM            MDM   1. Atrial fibrillation   2. Dyspnea   3. Chest pain   4. CAD (coronary artery disease)   5. Chronic diastolic heart failure    Shortness of breath with generalized weakness for the past hour. Patient initially complained of chest pain in triage but denies this to me. She says she just feels weak and short of breath.  Appears to be in new atrial fibrillation versus supraventricular ectopy. Atrial fibrillation appears to be brief and transient. She is in sinus rhythm on second EKG.   CAD hx: Chronic total occlusion of the distal posterior descending artery which fills via  collaterals by angiography in 2007   Chest x-ray negative. No evidence of volume overload. Patient converted back to sinus rhythm in ED. Denies chest pain.  Given age and CAD risk factors, uncertain etiology of dyspnea, HEART score 4, possible atrial fibrillation, will admit for observation.  I personally performed the services described in this documentation, which was scribed in my presence. The recorded information has been reviewed and is accurate.    Ezequiel Essex, MD 12/16/13 [phone]

## 2013-12-16 NOTE — ED Notes (Signed)
Pt presents to er with c/o mid center intermittent chest pain with sob, generalized weakness that started this am, son states that pt was fine this am at breakfast, then called him telling him that she felt bad and was having chest pain.

## 2013-12-16 NOTE — ED Notes (Signed)
Patient assisted into chair per request.

## 2013-12-16 NOTE — ED Notes (Signed)
Patient c/o intermittent chest pain since 0720 this morning. Biggest complaint is shortness of breath. Also c/o generalized weakness due to getting short of breath.

## 2013-12-16 NOTE — ED Notes (Signed)
Report given to Erica, RN unit 300. Ready to receive patient. 

## 2013-12-16 NOTE — Progress Notes (Signed)
  Echocardiogram 2D Echocardiogram has been performed.  Janalee Dane M 12/16/2013, 12:03 PM

## 2013-12-17 DIAGNOSIS — I10 Essential (primary) hypertension: Secondary | ICD-10-CM

## 2013-12-17 DIAGNOSIS — E119 Type 2 diabetes mellitus without complications: Secondary | ICD-10-CM | POA: Diagnosis not present

## 2013-12-17 DIAGNOSIS — R0609 Other forms of dyspnea: Secondary | ICD-10-CM

## 2013-12-17 DIAGNOSIS — R0989 Other specified symptoms and signs involving the circulatory and respiratory systems: Secondary | ICD-10-CM

## 2013-12-17 DIAGNOSIS — I5032 Chronic diastolic (congestive) heart failure: Secondary | ICD-10-CM | POA: Diagnosis not present

## 2013-12-17 LAB — BASIC METABOLIC PANEL
BUN: 23 mg/dL (ref 6–23)
CO2: 24 mEq/L (ref 19–32)
Calcium: 9.4 mg/dL (ref 8.4–10.5)
Chloride: 101 mEq/L (ref 96–112)
Creatinine, Ser: 1.3 mg/dL — ABNORMAL HIGH (ref 0.50–1.10)
GFR calc non Af Amer: 37 mL/min — ABNORMAL LOW (ref >90)
GFR, EST AFRICAN AMERICAN: 43 mL/min — AB (ref >90)
Glucose, Bld: 164 mg/dL — ABNORMAL HIGH (ref 70–99)
Potassium: 4.4 mEq/L (ref 3.7–5.3)
Sodium: 137 mEq/L (ref 137–147)

## 2013-12-17 LAB — GLUCOSE, CAPILLARY
Glucose-Capillary: 111 mg/dL — ABNORMAL HIGH (ref 70–99)
Glucose-Capillary: 109 mg/dL — ABNORMAL HIGH (ref 70–99)

## 2013-12-17 LAB — TROPONIN I

## 2013-12-17 MED ORDER — ENOXAPARIN SODIUM 30 MG/0.3ML ~~LOC~~ SOLN: 30.0000 mg | SUBCUTANEOUS | Status: DC

## 2013-12-17 NOTE — Progress Notes (Signed)
[facility] PROGRESS NOTE  Robin Guzman ZOX:[id number] DOB: April 27, 1928 DOA: 12/16/2013 PCP: Delphina Cahill, MD Cardiologist Dr. Sallyanne Kuster  Summary: 80 year old woman presented to the emergency department with episode of generalized weakness, shortness of breath and possible chest pain this morning. Initial evaluation was unremarkable except for the possibility of atrial fibrillation on initial EKG. Because of report of chest pain and possible atrial fibrillation patient was referred for observation.  Assessment/Plan: 1. Shortness of breath prior to admission. Resolved. Etiology unclear. Chest x-ray negative. No hypoxia. No evidence of ACS; troponins negative and EKG unremarkable. BNP normal, no findings to suggest CHF. No evidence of arrythmia. Well's score = 0, no history to suggest VTE, no swelling. Possibly dehydration as evidenced by renal insufficiency. 2. Possible chest pain. Although this is recorded in the chart by ER nurse the patient denied ever having chest pain to me. Heart score 4 (age and hx). 3. Possible atrial fibrillation/supraventricular arrhythmia. Review of EKGs and telemetry shows sinus rhythm with PACs. No evidence of atrial fibrillation. TSH and echocardiogram unremarkable. Continue Coreg. 4. Possible acute renal failure versus chronic kidney disease stage III. Improved today, possible dehydration. Followup as an outpatient. 5. Diabetes mellitus type 2 stable. Resume metformin on discharge. 6. Chronic diastolic congestive heart failure, NYHA class I. Stable. Not on diuretics at home. 7. History of coronary artery disease. Stable.   She has had no recurrence of shortness of breath. No chest pain. Echocardiogram, TSH reassuring. May be related to dehydration. She told me she never had chest pain, history seems reliable.  Plan discharge home, followup with cardiology 12/25/13 at [facility], consideration could be given to event monitor or further cardiac evaluation,  given lack of definite symptoms no further inpatient evaluation suggested.  Discussed with multiple family members at bedside.  Murray Hodgkins, MD  [facility]  Pager [phone] If 7PM-7AM, please contact night-coverage at [email or web address], password [email or web address] 12/17/2013, 12:03 PM  LOS: 1 day   Consultants:    Procedures:  2-D echocardiogram: Left ventricular ejection fraction 55-60%. Grade 1 diastolic dysfunction.  Antibiotics:    HPI/Subjective: She feels fine. No complaints. No chest pain or shortness of breath. No generalized weakness. She wants to go home.  Objective: Filed Vitals:   12/16/13 1440 12/16/13 1441 12/16/13 1934 12/17/13 0600  BP: 160/92 152/82 147/61 125/79  Pulse: 82 98 64 100  Temp: 98.2 F (36.8 C) 98.2 F (36.8 C) 98.6 F (37 C) 98.6 F (37 C)  TempSrc: Oral Oral Oral Oral  Resp: 20 20 20 20   Height:      Weight:    68.947 kg (152 lb)  SpO2: 100% 100% 100% 93%    Intake/Output Summary (Last 24 hours) at 12/17/13 1203 Last data filed at 12/16/13 1814  Gross per 24 hour  Intake    200 ml  Output    300 ml  Net   -100 ml     Filed Weights   12/16/13 1051 12/17/13 0600  Weight: 69 kg (152 lb 1.9 oz) 68.947 kg (152 lb)    Exam:   Afebrile, vital signs stable. No hypoxia.  General: Appears calm and comfortable. Speech fluent and clear.  Cardiovascular: Regular rate and rhythm. No murmur, rub or gallop. No lower extremity edema.  Respiratory: Clear to auscultation bilaterally. No wheezes, rales or rhonchi. Normal respiratory effort.  Psychiatric: Grossly normal mood and affect. Speech fluent and appropriate.  No lower extremity edema.  Data Reviewed:  Capillary blood sugars stable  Troponins negative  Creatinine improved, 1.30  2-D echocardiogram noted  Scheduled Meds: . amLODipine  10 mg Oral Daily  . aspirin EC  81 mg Oral Daily  . atorvastatin  20 mg Oral Daily  . carvedilol  12.5 mg Oral BID WC  . enalapril  20 mg  Oral Daily  . enoxaparin (LOVENOX) injection  40 mg Subcutaneous Q24H  . insulin aspart  0-9 Units Subcutaneous TID WC  . pantoprazole  40 mg Oral Daily   Continuous Infusions:   Principal Problem:   Chest pain Active Problems:   Chronic diastolic heart failure   DM2 (diabetes mellitus, type 2)   CAD (coronary artery disease)   Atrial fibrillation

## 2013-12-17 NOTE — Progress Notes (Signed)
Pt discharged with instructions, prescriptions, and care notes.  The patient and family member verbalized understanding.  The patient left the floor via w/c with staff and family in stable condition.  There was no further complaints or concerns at this time voiced.

## 2013-12-17 NOTE — Care Management Note (Signed)
    Page 1 of 1   12/17/2013     11:30:14 AM   CARE MANAGEMENT NOTE 12/17/2013  Patient:  Robin Guzman, Robin Guzman   Account Number:  [id number]  Date Initiated:  12/17/2013  Documentation initiated by:  Theophilus Kinds  Subjective/Objective Assessment:   Pt admitted from home with cp. Pt lives alone but has 2 daughters and and a son who is very active in the care of the pt. Pt is fairly independent with ADl's. Pt will return home at discharge.     Action/Plan:   No CM needs noted.   Anticipated DC Date:  12/18/2013   Anticipated DC Plan:  [place]  CM consult      Choice offered to / List presented to:             Status of service:  Completed, signed off Medicare Important Message given?   (If response is "NO", the following Medicare IM given date fields will be blank) Date Medicare IM given:   Date Additional Medicare IM given:    Discharge Disposition:  HOME/SELF CARE  Per UR Regulation:    If discussed at Long Length of Stay Meetings, dates discussed:    Comments:  12/17/13 [place], RN BSN CM

## 2013-12-17 NOTE — Discharge Summary (Signed)
Physician Discharge Summary  Robin Guzman ACZ:[id number] DOB: [birth year] DOA: 12/16/2013  PCP: Delphina Cahill, MD  Admit date: 12/16/2013 Discharge date: 12/17/2013  Recommendations for Outpatient Follow-up:  1. Followup rhythm, see discussion below 2. Followup with cardiology for reevaluation of hospital symptoms  Follow-up Information   Follow up with Delphina Cahill, MD In 2 weeks.   Specialty:  Internal Medicine   Contact information:    [place] [address] [phone]      Discharge Diagnoses:  1. Shortness of breath 2. Possible chest 3. Acute renal failure superimposed on chronic kidney disease stage III 4. Diabetes mellitus type 2 5. Chronic diastolic congestive heart failure 6. History of coronary artery disease  Discharge Condition: Improved Disposition: Home  Diet recommendation: Heart healthy diabetic diet  Filed Weights   12/16/13 1051 12/17/13 0600  Weight: 69 kg (152 lb 1.9 oz) 68.947 kg (152 lb)    History of present illness:  80 year old woman presented to the emergency department with episode of generalized weakness, shortness of breath and possible chest pain this morning. Initial evaluation was unremarkable except for the possibility of atrial fibrillation on initial EKG. Because of report of chest pain and possible atrial fibrillation patient was referred for observation.  Hospital Course:  Ms. Robin Guzman was observed overnight. She had no recurrent shortness of breath. She continues to deny any chest pain during or prior to hospitalization. Telemetry shows sinus rhythm with frequent PACs, no atrial fibrillation with demonstrated. Troponins were negative and 2-D echocardiogram was reassuring. ACS ruled out. Given lack of arrhythmia, lack of recurrence of symptoms patient stable for discharge.  1. Shortness of breath prior to admission. Resolved. Etiology unclear. Chest x-ray negative. No hypoxia. No evidence of ACS; troponins negative and EKG  unremarkable. BNP normal, no findings to suggest CHF. No evidence of arrythmia. Well's score = 0, no history to suggest VTE, no swelling. Possibly dehydration as evidenced by renal insufficiency. 2. Possible chest pain. Although this is recorded in the chart by ER nurse the patient denied ever having chest pain to me. Heart score 4 (age and hx). 3. Possible atrial fibrillation/supraventricular arrhythmia. Review of EKGs and telemetry shows sinus rhythm with PACs. No evidence of atrial fibrillation. TSH and echocardiogram unremarkable. Continue Coreg. 4. Possible acute renal failure versus chronic kidney disease stage III. Improved today, possible dehydration. Followup as an outpatient. 5. Diabetes mellitus type 2 stable. Resume metformin on discharge. 6. Chronic diastolic congestive heart failure, NYHA class I. Stable. Not on diuretics at home. 7. History of coronary artery disease. Stable. She has had no recurrence of shortness of breath. No chest pain. Echocardiogram, TSH reassuring. May be related to dehydration. She told me she never had chest pain, history seems reliable.  Plan discharge home, followup with cardiology 12/25/13 at [facility], consideration could be given to event monitor or further cardiac evaluation, given lack of definite symptoms no further inpatient evaluation suggested.  Discussed with multiple family members at bedside.  Consultants: none Procedures:  2-D echocardiogram: Left ventricular ejection fraction 55-60%. Grade 1 diastolic dysfunction. Discharge Instructions  Discharge Orders   Future Appointments Provider Department Dept Phone   12/25/2013 9:00 AM [place], [place] [facility] [phone]   Future Orders Complete By Expires   Activity as tolerated - No restrictions  As directed    Diet - low sodium heart healthy  As directed    Discharge instructions  As directed    Comments:  Call your physician or seek immediate medical attention for  chest pain, shortness of breath, worsening of condition.       Medication List         amLODipine 5 MG tablet  Commonly known as:  NORVASC  Take 2 tablets (10 mg total) by mouth daily.     aspirin EC 81 MG tablet  Take 81 mg by mouth daily.     atorvastatin 20 MG tablet  Commonly known as:  LIPITOR  Take 20 mg by mouth daily.     carvedilol 25 MG tablet  Commonly known as:  COREG  Take 0.5 tablets (12.5 mg total) by mouth 2 (two) times daily with a meal.     enalapril 20 MG tablet  Commonly known as:  VASOTEC  Take 20 mg by mouth daily.     esomeprazole 40 MG capsule  Commonly known as:  NEXIUM  Take 40 mg by mouth daily as needed (for hearburn).     glipiZIDE 2.5 MG 24 hr tablet  Commonly known as:  GLUCOTROL XL  Take 2.5 mg by mouth daily.     metFORMIN 500 MG tablet  Commonly known as:  GLUCOPHAGE  Take 500 mg by mouth 2 (two) times daily.     TRILIPIX 135 MG capsule  Generic drug:  Choline Fenofibrate  Take 135 mg by mouth daily.       Allergies  Allergen Reactions  . Advil [Ibuprofen] Other (See Comments)    weakness  . Daypro [Oxaprozin] Other (See Comments)    weakness    The results of significant diagnostics from this hospitalization (including imaging, microbiology, ancillary and laboratory) are listed below for reference.    Significant Diagnostic Studies: Dg Chest Portable 1 View  12/16/2013   CLINICAL DATA:  Chest pain. Coronary artery disease. Previous myocardial infarct. Chronic diastolic heart failure.  EXAM: PORTABLE CHEST - 1 VIEW  COMPARISON:  02/03/2013  FINDINGS: Cardiomegaly and ectasia of the thoracic aorta are stable. Both lungs are clear. No evidence of pleural effusion.  IMPRESSION: Stable cardiomegaly.  No active lung disease.   Electronically Signed   By: Earle Gell M.D.   On: 12/16/2013 08:28    Microbiology: Recent Results (from the past 240 hour(s))  URINE CULTURE     Status: None   Collection Time    12/16/13  2:15 PM       Result Value Range Status   Specimen Description URINE, CLEAN CATCH   Final   Special Requests NONE   Final   Culture  Setup Time     Final   Value: 12/16/2013 18:27     Performed at [place]     Final   Value: >=100,000 COLONIES/ML     Performed at [facility]   Culture     Final   Value: ESCHERICHIA COLI     Performed at [facility]   Report Status PENDING   Incomplete     Labs: Basic Metabolic Panel:  Recent Labs Lab 12/16/13 0755 12/17/13 1244  NA 138 137  K 4.5 4.4  CL 100 101  CO2 27 24  GLUCOSE 227* 164*  BUN 22 23  CREATININE 1.41* 1.30*  CALCIUM 9.6 9.4   CBC:  Recent Labs Lab 12/16/13 0755  WBC 3.7*  NEUTROABS 2.6  HGB 12.0  HCT 36.7  MCV 91.5  PLT 199   Cardiac Enzymes:  Recent Labs Lab 12/16/13 0755 12/16/13 1409  12/16/13 1846 12/17/13 0221  TROPONINI <0.30 <0.30 <0.30 <0.30     Recent Labs  12/16/13 0755  PROBNP 212.6   CBG:  Recent Labs Lab 12/16/13 1147 12/16/13 1632 12/16/13 1931 12/17/13 0708 12/17/13 1138  GLUCAP 103* 94 120* 111* 109*    Principal Problem:   Chest pain Active Problems:   Chronic diastolic heart failure   DM2 (diabetes mellitus, type 2)   CAD (coronary artery disease)   Atrial fibrillation   Time coordinating discharge: 35 minutes  Signed:  Murray Hodgkins, MD [facility] 12/17/2013, 7:14 PM

## 2013-12-18 ENCOUNTER — Telehealth: Payer: Self-pay | Admitting: Family Medicine

## 2013-12-18 LAB — URINE CULTURE

## 2013-12-18 NOTE — Telephone Encounter (Signed)
Urine culture resulted, shows E coli.  Discussed with family by phone, patient is tired but feeling well. We discussed infection and need to tx with abx.  Called in Rx to [facility] by family request. They will pick up.  Keflex 500 mg po BID x 3days  Murray Hodgkins, MD [facility]

## 2013-12-25 ENCOUNTER — Ambulatory Visit (INDEPENDENT_AMBULATORY_CARE_PROVIDER_SITE_OTHER): Payer: Medicare Other | Admitting: Cardiology

## 2013-12-25 ENCOUNTER — Encounter: Payer: Self-pay | Admitting: Cardiology

## 2013-12-25 VITALS — BP 140/92 | HR 74 | Ht 60.0 in | Wt 148.3 lb

## 2013-12-25 DIAGNOSIS — R0609 Other forms of dyspnea: Secondary | ICD-10-CM

## 2013-12-25 DIAGNOSIS — N390 Urinary tract infection, site not specified: Secondary | ICD-10-CM | POA: Diagnosis not present

## 2013-12-25 DIAGNOSIS — I2510 Atherosclerotic heart disease of native coronary artery without angina pectoris: Secondary | ICD-10-CM | POA: Diagnosis not present

## 2013-12-25 DIAGNOSIS — R0600 Dyspnea, unspecified: Secondary | ICD-10-CM | POA: Insufficient documentation

## 2013-12-25 DIAGNOSIS — R0989 Other specified symptoms and signs involving the circulatory and respiratory systems: Secondary | ICD-10-CM

## 2013-12-25 DIAGNOSIS — R55 Syncope and collapse: Secondary | ICD-10-CM | POA: Diagnosis not present

## 2013-12-25 DIAGNOSIS — I10 Essential (primary) hypertension: Secondary | ICD-10-CM

## 2013-12-25 DIAGNOSIS — I5032 Chronic diastolic (congestive) heart failure: Secondary | ICD-10-CM

## 2013-12-25 DIAGNOSIS — E119 Type 2 diabetes mellitus without complications: Secondary | ICD-10-CM

## 2013-12-25 NOTE — Assessment & Plan Note (Signed)
Mild CAD at cath in 2007

## 2013-12-25 NOTE — Progress Notes (Signed)
12/25/2013 Robin Guzman   [birth year]  [id number]  Primary Physicia Delphina Cahill, MD Primary Cardiologist: Dr Sallyanne Kuster  HPI:  Robin Guzman 80 y/o from [place] followed in our practice for years. She now sees Dr Sallyanne Kuster. She has a history of mild CAD at cath in 2007. She has DM, HTN, and dyslipidemia. She was at home two weeks ago cleaning up after having breakfast with her son. She had sudden SOB and weakness. She says she collapsed into a chair. She called her son who came back to the house and called EMS. She was taken to [facility] for further evaluation. There was a report of "atrial fibrillation" on EKG but this was never documented. She did apparently have PACs. Her labs were unremarkable. An echo showed grade 1 diastolic dysfunction and NL LVF. She felt better in 24 hours and was discharged. Later a urine culture came back showing an E. Coli UTI and this was treated. Since she has been home she has continued to have some weakness. She denies chest pain or palpitations.    Current Outpatient Prescriptions  Medication Sig Dispense Refill  . amLODipine (NORVASC) 5 MG tablet Take 2 tablets (10 mg total) by mouth daily.  30 tablet  6  . aspirin EC 81 MG tablet Take 81 mg by mouth daily.      [name] atorvastatin (LIPITOR) 20 MG tablet Take 20 mg by mouth daily.      . carvedilol (COREG) 25 MG tablet Take 0.5 tablets (12.5 mg total) by mouth 2 (two) times daily with a meal.      . Choline Fenofibrate (TRILIPIX) 135 MG capsule Take 135 mg by mouth daily.      . enalapril (VASOTEC) 20 MG tablet Take 20 mg by mouth daily.      [name] esomeprazole (NEXIUM) 40 MG capsule Take 40 mg by mouth daily as needed (for hearburn).       [name] glipiZIDE (GLUCOTROL XL) 2.5 MG 24 hr tablet Take 2.5 mg by mouth daily.      . metFORMIN (GLUCOPHAGE) 500 MG tablet Take 500 mg by mouth 2 (two) times daily.       No current facility-administered medications for this visit.    Allergies  Allergen Reactions  . Advil [Ibuprofen] Other  (See Comments)    weakness  . Daypro [Oxaprozin] Other (See Comments)    weakness    History   Social History  . Marital Status: Widowed    Spouse Name: N/A    Number of Children: N/A  . Years of Education: N/A   Occupational History  . Not on file.   Social History Main Topics  . Smoking status: Never Smoker   . Smokeless tobacco: Not on file  . Alcohol Use: No  . Drug Use: No  . Sexual Activity: Not on file   Other Topics Concern  . Not on file   Social History Narrative  . No narrative on file     Review of Systems: General: negative for chills, fever, night sweats or weight changes.  Cardiovascular: negative for chest pain, dyspnea on exertion, edema, orthopnea, palpitations, paroxysmal nocturnal dyspnea or shortness of breath Dermatological: negative for rash Respiratory: negative for cough or wheezing Urologic: negative for hematuria Abdominal: negative for nausea, vomiting, diarrhea, bright red blood per rectum, melena, or hematemesis Neurologic: negative for visual changes, syncope, or dizziness All other systems reviewed and are otherwise negative except as noted above.    Blood pressure 140/92, pulse 74, height 5' (  1.524 m), weight 148 lb 4.8 oz (67.268 kg).  General appearance: alert, cooperative, appears stated age and no distress Neck: no carotid bruit and no JVD Lungs: clear to auscultation bilaterally Heart: regular rate and rhythm  EKG- NSR, LAD  ASSESSMENT AND PLAN:   Near syncope Pt admitted to [facility]   Chronic diastolic heart failure NL LVF with grade 1 diastolic dysfunction by echo 12/16/13  Essential hypertension Controlled  Dyspnea Sudden SOB and near syncope prompting overnight admission  DM2 (diabetes mellitus, type 2) .  CAD (coronary artery disease) Mild CAD at cath in 2007  UTI (urinary tract infection) E. Coli UTI noted on hospital lab work- treated   PLAN  I think its possible her symptoms could have been from her UTI  but feel we should evaluate her further for arrythmia and atypical presentation of CAD. I ordered a two week event monitor and a Lexiscan Myoview. She will follow up with Dr Sallyanne Kuster after these tests. I've asked her to increase her activity as tolerated. I discussed this with her daughter who accompanied her today.   Julianny Milstein KPA-C 12/25/2013 9:35 AM

## 2013-12-25 NOTE — Assessment & Plan Note (Signed)
Sudden SOB and near syncope prompting overnight admission

## 2013-12-25 NOTE — Assessment & Plan Note (Signed)
NL LVF with grade 1 diastolic dysfunction by echo 12/16/13

## 2013-12-25 NOTE — Assessment & Plan Note (Signed)
Pt admitted to [facility] 

## 2013-12-25 NOTE — Patient Instructions (Signed)
Your physician recommends that you schedule a follow-up appointment in: 4 weeks with Dr Sallyanne Kuster   Your physician has recommended that you wear an event monitor. Event monitors are medical devices that record the heart's electrical activity. Doctors most often [place] these monitors to diagnose arrhythmias. Arrhythmias are problems with the speed or rhythm of the heartbeat. The monitor is a small, portable device. You can wear one while you do your normal daily activities. This is usually used to diagnose what is causing palpitations/syncope (passing out). 2 weeks  Your physician has requested that you have a lexiscan myoview. For further information please visit [email or web address]. Please follow instruction sheet, as given.

## 2013-12-25 NOTE — Assessment & Plan Note (Signed)
Controlled.  

## 2013-12-25 NOTE — Assessment & Plan Note (Signed)
E. Coli UTI noted on hospital lab work- treated

## 2013-12-27 ENCOUNTER — Ambulatory Visit (HOSPITAL_COMMUNITY)
Admission: RE | Admit: 2013-12-27 | Discharge: 2013-12-27 | Disposition: A | Discharge status: Home / Self Care | Payer: Medicare Other | Source: Ambulatory Visit | Attending: Cardiovascular Disease | Admitting: Cardiovascular Disease

## 2013-12-27 DIAGNOSIS — I10 Essential (primary) hypertension: Secondary | ICD-10-CM | POA: Diagnosis not present

## 2013-12-27 DIAGNOSIS — R0989 Other specified symptoms and signs involving the circulatory and respiratory systems: Secondary | ICD-10-CM | POA: Insufficient documentation

## 2013-12-27 DIAGNOSIS — I5032 Chronic diastolic (congestive) heart failure: Secondary | ICD-10-CM | POA: Diagnosis not present

## 2013-12-27 DIAGNOSIS — E119 Type 2 diabetes mellitus without complications: Secondary | ICD-10-CM | POA: Insufficient documentation

## 2013-12-27 DIAGNOSIS — R0609 Other forms of dyspnea: Secondary | ICD-10-CM | POA: Diagnosis not present

## 2013-12-27 DIAGNOSIS — R42 Dizziness and giddiness: Secondary | ICD-10-CM | POA: Diagnosis not present

## 2013-12-27 DIAGNOSIS — Z8249 Family history of ischemic heart disease and other diseases of the circulatory system: Secondary | ICD-10-CM | POA: Diagnosis not present

## 2013-12-27 DIAGNOSIS — E663 Overweight: Secondary | ICD-10-CM | POA: Diagnosis not present

## 2013-12-27 DIAGNOSIS — I509 Heart failure, unspecified: Secondary | ICD-10-CM | POA: Insufficient documentation

## 2013-12-27 DIAGNOSIS — R0600 Dyspnea, unspecified: Secondary | ICD-10-CM

## 2013-12-27 DIAGNOSIS — R55 Syncope and collapse: Secondary | ICD-10-CM | POA: Diagnosis not present

## 2013-12-27 MED ORDER — TECHNETIUM TC 99M SESTAMIBI GENERIC - CARDIOLITE
30.6000 | Freq: Once | INTRAVENOUS | Status: AC | PRN
  Administered 2013-12-27: 31 via INTRAVENOUS

## 2013-12-27 MED ORDER — REGADENOSON 0.4 MG/5ML IV SOLN
0.4000 mg | Freq: Once | INTRAVENOUS | Status: AC
  Administered 2013-12-27: 0.4 mg via INTRAVENOUS

## 2013-12-27 MED ORDER — TECHNETIUM TC 99M SESTAMIBI GENERIC - CARDIOLITE
10.9000 | Freq: Once | INTRAVENOUS | Status: AC | PRN
  Administered 2013-12-27: 10.9 via INTRAVENOUS

## 2013-12-27 MED ORDER — AMINOPHYLLINE 25 MG/ML IV SOLN
75.0000 mg | Freq: Once | INTRAVENOUS | Status: AC
  Administered 2013-12-27: 75 mg via INTRAVENOUS

## 2013-12-27 NOTE — Procedures (Addendum)
[place] [address] [place] [address] [phone]  Cardiology Nuclear Med Study  Robin Guzman is a 80 y.o. female     MRN : [id number]     DOB: [birth year]  Procedure Date: 12/27/2013  Nuclear Med Background Indication for Stress Test:  Evaluation for Ischemia and Post Hospital History:  CAD-mild;chronic diastolic heart failure Cardiac Risk Factors: Family History - CAD, Hypertension, Lipids, NIDDM and Overweight  Symptoms:  Dizziness, DOE and Near Syncope   Nuclear Pre-Procedure Caffeine/Decaff Intake:  1:00am NPO After: 11am   IV Site: R Hand  IV 0.9% NS with Angio Cath:  22g  Chest Size (in):  n/a IV Started by: Azucena Cecil, RN  Height: 5' (1.524 m)  Cup Size: B  BMI:  Body mass index is 28.9 kg/(m^2). Weight:  148 lb (67.132 kg)   Tech Comments:  n/a    Nuclear Med Study 1 or 2 day study: 1 day  Stress Test Type:  [place] Provider:  Sanda Klein, MD   Resting Radionuclide: Technetium 24m Sestamibi  Resting Radionuclide Dose: 10.9 mCi   Stress Radionuclide:  Technetium 66m Sestamibi  Stress Radionuclide Dose: 30.6 mCi           Stress Protocol Rest HR: 71 Stress HR: 100  Rest BP: 133/74 Stress BP: 144/71  Exercise Time (min): n/a METS: n/a   Predicted Max HR: 137 bpm % Max HR: 81.75 bpm Rate Pressure Product: 16128  Dose of Adenosine (mg):  n/a Dose of Lexiscan: 0.4 mg  Dose of Atropine (mg): n/a Dose of Dobutamine: n/a mcg/kg/min (at max HR)  Stress Test Technologist: Leane Para, CCT Nuclear Technologist: Imagene Riches, CNMT   Rest Procedure:  Myocardial perfusion imaging was performed at rest 45 minutes following the intravenous administration of Technetium 53m Sestamibi. Stress Procedure:  The patient received IV Lexiscan 0.4 mg over 15-seconds.  Technetium 29m Sestamibi injected at 30-seconds.  The patient experienced marked SOB; 75 mg of IV Aminophylline was  administered with resolution of symptoms.  There were no significant changes with Lexiscan.  Quantitative spect images were obtained after a 45 minute delay.  Transient Ischemic Dilatation (Normal <1.22):  1.05 Lung/Heart Ratio (Normal <0.45):  0.24 QGS EDV:  n/a ml QGS ESV:  n/a ml LV Ejection Fraction: study not gated     Rest ECG: NSR - Normal EKG  Stress ECG: No significant change from baseline ECG  QPS Raw Data Images:  There is a breast shadow that accounts for the anterior attenuation. Stress Images:  Minimally reduced apical uptake Rest Images:  Comparison with the stress images reveals no significant change. Subtraction (SDS):  No evidence of ischemia. LV Wall Motion:  Study was not gated  Impression Exercise Capacity:  Lexiscan with no exercise. BP Response:  Normal blood pressure response. Clinical Symptoms:  No significant symptoms noted. ECG Impression:  No significant ECG changes with Lexiscan. Comparison with Prior Nuclear Study: Previously reported inferolateral ischemia is not seen.   Overall Impression:  Low risk stress nuclear study with small and mild apical fixed defect. This most likely represents breast attenuation artifact. In the absence of gated images, a small apical scar cannot be excluded. Ischemia is not present.Sanda Klein, MD  12/27/2013 4:39 PM

## 2013-12-31 DIAGNOSIS — I10 Essential (primary) hypertension: Secondary | ICD-10-CM | POA: Diagnosis not present

## 2013-12-31 DIAGNOSIS — I509 Heart failure, unspecified: Secondary | ICD-10-CM | POA: Diagnosis not present

## 2014-01-10 ENCOUNTER — Other Ambulatory Visit: Payer: Self-pay | Admitting: *Deleted

## 2014-01-10 DIAGNOSIS — R55 Syncope and collapse: Secondary | ICD-10-CM

## 2014-01-15 NOTE — Progress Notes (Signed)
Pt. Informed of her results

## 2014-01-21 DIAGNOSIS — E119 Type 2 diabetes mellitus without complications: Secondary | ICD-10-CM | POA: Diagnosis not present

## 2014-01-21 DIAGNOSIS — I10 Essential (primary) hypertension: Secondary | ICD-10-CM | POA: Diagnosis not present

## 2014-01-21 DIAGNOSIS — E782 Mixed hyperlipidemia: Secondary | ICD-10-CM | POA: Diagnosis not present

## 2014-01-23 DIAGNOSIS — I2510 Atherosclerotic heart disease of native coronary artery without angina pectoris: Secondary | ICD-10-CM | POA: Diagnosis not present

## 2014-01-23 DIAGNOSIS — E782 Mixed hyperlipidemia: Secondary | ICD-10-CM | POA: Diagnosis not present

## 2014-01-23 DIAGNOSIS — E119 Type 2 diabetes mellitus without complications: Secondary | ICD-10-CM | POA: Diagnosis not present

## 2014-01-23 DIAGNOSIS — I10 Essential (primary) hypertension: Secondary | ICD-10-CM | POA: Diagnosis not present

## 2014-01-24 ENCOUNTER — Ambulatory Visit (INDEPENDENT_AMBULATORY_CARE_PROVIDER_SITE_OTHER): Payer: Medicare Other | Admitting: Cardiovascular Disease

## 2014-01-24 ENCOUNTER — Encounter: Payer: Self-pay | Admitting: Cardiovascular Disease

## 2014-01-24 VITALS — BP 130/70 | HR 72 | Resp 16 | Ht 60.0 in | Wt 149.3 lb

## 2014-01-24 DIAGNOSIS — R55 Syncope and collapse: Secondary | ICD-10-CM

## 2014-01-24 DIAGNOSIS — I10 Essential (primary) hypertension: Secondary | ICD-10-CM

## 2014-01-24 DIAGNOSIS — I2510 Atherosclerotic heart disease of native coronary artery without angina pectoris: Secondary | ICD-10-CM | POA: Diagnosis not present

## 2014-01-24 DIAGNOSIS — E785 Hyperlipidemia, unspecified: Secondary | ICD-10-CM | POA: Diagnosis not present

## 2014-01-24 DIAGNOSIS — I5032 Chronic diastolic (congestive) heart failure: Secondary | ICD-10-CM

## 2014-01-24 NOTE — Assessment & Plan Note (Signed)
Asymptomatic, low-risk nuclear study.

## 2014-01-24 NOTE — Assessment & Plan Note (Signed)
No meaningful arrhythmias seen on event monitor. Suspect her urinary tract infection may have been causal. Now asymptomatic.

## 2014-01-24 NOTE — Patient Instructions (Signed)
Your physician recommends that you schedule a follow-up appointment in: ONE YEAR 

## 2014-01-24 NOTE — Assessment & Plan Note (Signed)
Asymptomatic, clinically euvolemic

## 2014-01-24 NOTE — Assessment & Plan Note (Signed)
Good control

## 2014-01-24 NOTE — Progress Notes (Signed)
Patient ID: Robin Guzman, female   DOB: 1928-10-06, 80 y.o.   MRN: [id number]      Reason for office visit Near syncope follow-up  Robin Guzman has done well since her most recent evaluation with Robin Guzman. She had been briefly hospitalized after a near syncopal event. An outpatient nuclear perfusion study shows no evidence of significant coronary insufficiency. She wore a 30 day event monitor that showed occasional PACs and a single brief run of nonsustained atrial tachycardia lasting less than 10 seconds in duration (asymptomatic). She has not had any more problems and near-syncope. She complains of mild dizziness if she bends over. She has not had angina, edema, palpitations or new neurological complaints. She has occasional soreness of breath when she "over exerts herself". Her blood pressure was a little bit high when she saw Dr. [place] Crane recently, but in our office today her blood pressure is excellent.   Allergies  Allergen Reactions  . Advil [Ibuprofen] Other (See Comments)    weakness  . Daypro [Oxaprozin] Other (See Comments)    weakness    Current Outpatient Prescriptions  Medication Sig Dispense Refill  . amLODipine (NORVASC) 5 MG tablet Take 2 tablets (10 mg total) by mouth daily.  30 tablet  6  . aspirin EC 81 MG tablet Take 81 mg by mouth daily.      [name] atorvastatin (LIPITOR) 20 MG tablet Take 20 mg by mouth daily.      . carvedilol (COREG) 25 MG tablet Take 0.5 tablets (12.5 mg total) by mouth 2 (two) times daily with a meal.      . Choline Fenofibrate (TRILIPIX) 135 MG capsule Take 135 mg by mouth daily.      . enalapril (VASOTEC) 20 MG tablet Take 20 mg by mouth daily.      [name] esomeprazole (NEXIUM) 40 MG capsule Take 40 mg by mouth daily as needed (for hearburn).       [name] glipiZIDE (GLUCOTROL XL) 2.5 MG 24 hr tablet Take 2.5 mg by mouth daily.      . metFORMIN (GLUCOPHAGE) 500 MG tablet Take 500 mg by mouth 2 (two) times daily.       No current facility-administered  medications for this visit.    Past Medical History  Diagnosis Date  . Diabetes mellitus without complication   . CHF (congestive heart failure)   . Hypertension   . Hypercholesteremia   . Dyspnea on exertion   . Hx of echocardiogram 04/09/2011    EF 55% Mildly hypertrophic left ventricle with normal systolic function, Moderate (grade II) diastolic dysfunction. Elevated left atrial pressure. No significant valvular abnormalities. No pericardial effusion. Mild to moderate pulmonary arterial hypertension.  [name] History of stress test 07/05/2006    High risk scan cardiac cathe was recommended.  . Dyslipidemia   . Myocardial infarct     Past Surgical History  Procedure Laterality Date  . Abdominal hysterectomy      Family History  Problem Relation Age of Onset  . Cancer Mother   . Heart Problems Father   . Cancer Brother     brain  . Cancer Sister     brain    History   Social History  . Marital Status: Widowed    Spouse Name: N/A    Number of Children: N/A  . Years of Education: N/A   Occupational History  . Not on file.   Social History Main Topics  . Smoking status: Never Smoker   .  Smokeless tobacco: Not on file  . Alcohol Use: No  . Drug Use: No  . Sexual Activity: Not on file   Other Topics Concern  . Not on file   Social History Narrative  . No narrative on file    Review of systems: The patient specifically denies any chest pain at rest or with exertion, dyspnea at rest or with exertion, orthopnea, paroxysmal nocturnal dyspnea, syncope, palpitations, focal neurological deficits, intermittent claudication, lower extremity edema, unexplained weight gain, cough, hemoptysis or wheezing.  The patient also denies abdominal pain, nausea, vomiting, dysphagia, diarrhea, constipation, polyuria, polydipsia, dysuria, hematuria, frequency, urgency, abnormal bleeding or bruising, fever, chills, unexpected weight changes, mood swings, change in skin or hair texture,  change in voice quality, auditory or visual problems, allergic reactions or rashes, new musculoskeletal complaints other than usual "aches and pains".   PHYSICAL EXAM BP 130/70  Pulse 72  Resp 16  Ht 5' (1.524 m)  Wt 67.722 kg (149 lb 4.8 oz)  BMI 29.16 kg/m2  General: Alert, oriented x3, no distress Head: no evidence of trauma, PERRL, EOMI, no exophtalmos or lid lag, no myxedema, no xanthelasma; normal ears, nose and oropharynx Neck: normal jugular venous pulsations and no hepatojugular reflux; brisk carotid pulses without delay and no carotid bruits Chest: clear to auscultation, no signs of consolidation by percussion or palpation, normal fremitus, symmetrical and full respiratory excursions Cardiovascular: normal position and quality of the apical impulse, regular rhythm, normal first and second heart sounds, no murmurs, rubs or gallops Abdomen: no tenderness or distention, no masses by palpation, no abnormal pulsatility or arterial bruits, normal bowel sounds, no hepatosplenomegaly Extremities: no clubbing, cyanosis or edema; 2+ radial, ulnar and brachial pulses bilaterally; 2+ right femoral, posterior tibial and dorsalis pedis pulses; 2+ left femoral, posterior tibial and dorsalis pedis pulses; no subclavian or femoral bruits Neurological: grossly nonfocal  Lipid Panel  No results found for this basename: chol, trig, hdl, cholhdl, vldl, ldlcalc    BMET    Component Value Date/Time   NA 137 12/17/2013 1244   K 4.4 12/17/2013 1244   CL 101 12/17/2013 1244   CO2 24 12/17/2013 1244   GLUCOSE 164* 12/17/2013 1244   BUN 23 12/17/2013 1244   CREATININE 1.30* 12/17/2013 1244   CALCIUM 9.4 12/17/2013 1244   GFRNONAA 37* 12/17/2013 1244   GFRAA 43* 12/17/2013 1244     ASSESSMENT AND PLAN CAD (coronary artery disease) Asymptomatic, low-risk nuclear study.  Near syncope No meaningful arrhythmias seen on event monitor. Suspect her urinary tract infection may have been causal. Now  asymptomatic.  Hyperlipidemia On combination statin and fenofibrate.  Essential hypertension Good control  Chronic diastolic heart failure Asymptomatic, clinically euvolemic   yearly followup   Wyatt Thorstenson  Sanda Klein, MD, [facility] [phone] office [phone] pager

## 2014-01-24 NOTE — Assessment & Plan Note (Signed)
On combination statin and fenofibrate.

## 2014-03-27 ENCOUNTER — Emergency Department (HOSPITAL_COMMUNITY)
Admission: EM | Admit: 2014-03-27 | Discharge: 2014-03-27 | Disposition: A | Discharge status: Home / Self Care | Payer: No Typology Code available for payment source | Attending: Emergency Medicine | Admitting: Emergency Medicine

## 2014-03-27 ENCOUNTER — Encounter (HOSPITAL_COMMUNITY): Payer: Self-pay | Admitting: Emergency Medicine

## 2014-03-27 DIAGNOSIS — Y9389 Activity, other specified: Secondary | ICD-10-CM | POA: Insufficient documentation

## 2014-03-27 DIAGNOSIS — Z043 Encounter for examination and observation following other accident: Secondary | ICD-10-CM | POA: Diagnosis not present

## 2014-03-27 DIAGNOSIS — I509 Heart failure, unspecified: Secondary | ICD-10-CM | POA: Diagnosis not present

## 2014-03-27 DIAGNOSIS — M6281 Muscle weakness (generalized): Secondary | ICD-10-CM | POA: Insufficient documentation

## 2014-03-27 DIAGNOSIS — I252 Old myocardial infarction: Secondary | ICD-10-CM | POA: Diagnosis not present

## 2014-03-27 DIAGNOSIS — E785 Hyperlipidemia, unspecified: Secondary | ICD-10-CM | POA: Insufficient documentation

## 2014-03-27 DIAGNOSIS — E119 Type 2 diabetes mellitus without complications: Secondary | ICD-10-CM | POA: Diagnosis not present

## 2014-03-27 DIAGNOSIS — Z79899 Other long term (current) drug therapy: Secondary | ICD-10-CM | POA: Insufficient documentation

## 2014-03-27 DIAGNOSIS — E7800 Pure hypercholesterolemia, unspecified: Secondary | ICD-10-CM | POA: Insufficient documentation

## 2014-03-27 DIAGNOSIS — Y9289 Other specified places as the place of occurrence of the external cause: Secondary | ICD-10-CM | POA: Diagnosis not present

## 2014-03-27 DIAGNOSIS — Z7982 Long term (current) use of aspirin: Secondary | ICD-10-CM | POA: Diagnosis not present

## 2014-03-27 DIAGNOSIS — I10 Essential (primary) hypertension: Secondary | ICD-10-CM | POA: Insufficient documentation

## 2014-03-27 DIAGNOSIS — R0602 Shortness of breath: Secondary | ICD-10-CM | POA: Diagnosis not present

## 2014-03-27 NOTE — ED Notes (Signed)
Pt tolerated drink and crackers without difficulty.
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
## 2014-03-27 NOTE — ED Notes (Signed)
nad noted prior to dc.dc instruction reviewed and explained. Voiced understanding.
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
## 2014-04-24 DIAGNOSIS — I10 Essential (primary) hypertension: Secondary | ICD-10-CM | POA: Diagnosis not present

## 2014-04-24 DIAGNOSIS — E782 Mixed hyperlipidemia: Secondary | ICD-10-CM | POA: Diagnosis not present

## 2014-04-24 DIAGNOSIS — E119 Type 2 diabetes mellitus without complications: Secondary | ICD-10-CM | POA: Diagnosis not present

## 2014-04-26 ENCOUNTER — Ambulatory Visit (HOSPITAL_COMMUNITY)
Admission: RE | Admit: 2014-04-26 | Discharge: 2014-04-26 | Disposition: A | Discharge status: Home / Self Care | Payer: No Typology Code available for payment source | Source: Ambulatory Visit | Attending: Internal Medicine | Admitting: Internal Medicine

## 2014-04-26 ENCOUNTER — Other Ambulatory Visit (HOSPITAL_COMMUNITY): Payer: Self-pay | Admitting: Internal Medicine

## 2014-04-26 DIAGNOSIS — M25519 Pain in unspecified shoulder: Secondary | ICD-10-CM | POA: Diagnosis not present

## 2014-04-26 DIAGNOSIS — S46909A Unspecified injury of unspecified muscle, fascia and tendon at shoulder and upper arm level, unspecified arm, initial encounter: Secondary | ICD-10-CM | POA: Diagnosis not present

## 2014-04-26 DIAGNOSIS — E119 Type 2 diabetes mellitus without complications: Secondary | ICD-10-CM | POA: Diagnosis not present

## 2014-04-26 DIAGNOSIS — E782 Mixed hyperlipidemia: Secondary | ICD-10-CM | POA: Diagnosis not present

## 2014-04-26 DIAGNOSIS — I10 Essential (primary) hypertension: Secondary | ICD-10-CM | POA: Diagnosis not present

## 2014-04-26 DIAGNOSIS — M25511 Pain in right shoulder: Secondary | ICD-10-CM

## 2014-04-26 DIAGNOSIS — I2510 Atherosclerotic heart disease of native coronary artery without angina pectoris: Secondary | ICD-10-CM | POA: Diagnosis not present

## 2014-04-26 DIAGNOSIS — S4980XA Other specified injuries of shoulder and upper arm, unspecified arm, initial encounter: Secondary | ICD-10-CM | POA: Diagnosis not present

## 2014-04-29 DIAGNOSIS — D049 Carcinoma in situ of skin, unspecified: Secondary | ICD-10-CM | POA: Diagnosis not present

## 2014-04-29 DIAGNOSIS — L989 Disorder of the skin and subcutaneous tissue, unspecified: Secondary | ICD-10-CM | POA: Diagnosis not present

## 2014-04-29 DIAGNOSIS — D0430 Carcinoma in situ of skin of unspecified part of face: Secondary | ICD-10-CM | POA: Diagnosis not present

## 2014-04-29 DIAGNOSIS — C44319 Basal cell carcinoma of skin of other parts of face: Secondary | ICD-10-CM | POA: Diagnosis not present

## 2014-05-23 ENCOUNTER — Other Ambulatory Visit: Payer: Self-pay | Admitting: Cardiovascular Disease

## 2014-05-23 NOTE — Telephone Encounter (Signed)
Rx was sent to pharmacy electronically. 

## 2014-05-27 DIAGNOSIS — D0439 Carcinoma in situ of skin of other parts of face: Secondary | ICD-10-CM | POA: Diagnosis not present

## 2014-05-27 DIAGNOSIS — D0430 Carcinoma in situ of skin of unspecified part of face: Secondary | ICD-10-CM | POA: Diagnosis not present

## 2014-07-01 DIAGNOSIS — C44319 Basal cell carcinoma of skin of other parts of face: Secondary | ICD-10-CM | POA: Diagnosis not present

## 2014-07-22 DIAGNOSIS — Z23 Encounter for immunization: Secondary | ICD-10-CM | POA: Diagnosis not present

## 2014-07-30 DIAGNOSIS — I10 Essential (primary) hypertension: Secondary | ICD-10-CM | POA: Diagnosis not present

## 2014-07-30 DIAGNOSIS — E782 Mixed hyperlipidemia: Secondary | ICD-10-CM | POA: Diagnosis not present

## 2014-07-30 DIAGNOSIS — E119 Type 2 diabetes mellitus without complications: Secondary | ICD-10-CM | POA: Diagnosis not present

## 2014-08-02 DIAGNOSIS — I10 Essential (primary) hypertension: Secondary | ICD-10-CM | POA: Diagnosis not present

## 2014-08-02 DIAGNOSIS — E782 Mixed hyperlipidemia: Secondary | ICD-10-CM | POA: Diagnosis not present

## 2014-08-02 DIAGNOSIS — D539 Nutritional anemia, unspecified: Secondary | ICD-10-CM | POA: Diagnosis not present

## 2014-08-02 DIAGNOSIS — E1129 Type 2 diabetes mellitus with other diabetic kidney complication: Secondary | ICD-10-CM | POA: Diagnosis not present

## 2014-08-12 ENCOUNTER — Telehealth: Payer: Self-pay | Admitting: Cardiovascular Disease

## 2014-08-12 NOTE — Telephone Encounter (Signed)
Closed encounter °

## 2014-09-03 DIAGNOSIS — D508 Other iron deficiency anemias: Secondary | ICD-10-CM | POA: Diagnosis not present

## 2014-09-03 DIAGNOSIS — D649 Anemia, unspecified: Secondary | ICD-10-CM | POA: Diagnosis not present

## 2014-09-06 DIAGNOSIS — D508 Other iron deficiency anemias: Secondary | ICD-10-CM | POA: Diagnosis not present

## 2014-09-23 ENCOUNTER — Encounter (HOSPITAL_COMMUNITY): Payer: Self-pay | Admitting: Emergency Medicine

## 2014-10-01 DIAGNOSIS — Z85828 Personal history of other malignant neoplasm of skin: Secondary | ICD-10-CM | POA: Diagnosis not present

## 2014-10-01 DIAGNOSIS — Z1283 Encounter for screening for malignant neoplasm of skin: Secondary | ICD-10-CM | POA: Diagnosis not present

## 2014-10-11 DIAGNOSIS — I10 Essential (primary) hypertension: Secondary | ICD-10-CM | POA: Diagnosis not present

## 2014-10-11 DIAGNOSIS — D509 Iron deficiency anemia, unspecified: Secondary | ICD-10-CM | POA: Diagnosis not present

## 2014-10-11 DIAGNOSIS — D508 Other iron deficiency anemias: Secondary | ICD-10-CM | POA: Diagnosis not present

## 2014-10-23 DIAGNOSIS — M545 Low back pain: Secondary | ICD-10-CM | POA: Diagnosis not present

## 2014-11-13 ENCOUNTER — Emergency Department (HOSPITAL_COMMUNITY)
Admission: EM | Admit: 2014-11-13 | Discharge: 2014-11-13 | Disposition: A | Discharge status: Home / Self Care | Payer: Medicare Other | Attending: Emergency Medicine | Admitting: Emergency Medicine

## 2014-11-13 ENCOUNTER — Emergency Department (HOSPITAL_COMMUNITY): Payer: Medicare Other

## 2014-11-13 ENCOUNTER — Encounter (HOSPITAL_COMMUNITY): Payer: Self-pay | Admitting: Emergency Medicine

## 2014-11-13 DIAGNOSIS — R0602 Shortness of breath: Secondary | ICD-10-CM | POA: Diagnosis not present

## 2014-11-13 DIAGNOSIS — I447 Left bundle-branch block, unspecified: Secondary | ICD-10-CM | POA: Insufficient documentation

## 2014-11-13 DIAGNOSIS — Z7982 Long term (current) use of aspirin: Secondary | ICD-10-CM | POA: Insufficient documentation

## 2014-11-13 DIAGNOSIS — Z79899 Other long term (current) drug therapy: Secondary | ICD-10-CM | POA: Insufficient documentation

## 2014-11-13 DIAGNOSIS — I509 Heart failure, unspecified: Secondary | ICD-10-CM | POA: Diagnosis not present

## 2014-11-13 DIAGNOSIS — E785 Hyperlipidemia, unspecified: Secondary | ICD-10-CM | POA: Diagnosis not present

## 2014-11-13 DIAGNOSIS — I517 Cardiomegaly: Secondary | ICD-10-CM | POA: Diagnosis not present

## 2014-11-13 DIAGNOSIS — I252 Old myocardial infarction: Secondary | ICD-10-CM | POA: Insufficient documentation

## 2014-11-13 DIAGNOSIS — I10 Essential (primary) hypertension: Secondary | ICD-10-CM | POA: Diagnosis not present

## 2014-11-13 DIAGNOSIS — R531 Weakness: Secondary | ICD-10-CM | POA: Diagnosis not present

## 2014-11-13 DIAGNOSIS — R404 Transient alteration of awareness: Secondary | ICD-10-CM | POA: Diagnosis not present

## 2014-11-13 DIAGNOSIS — R5383 Other fatigue: Secondary | ICD-10-CM | POA: Insufficient documentation

## 2014-11-13 DIAGNOSIS — E119 Type 2 diabetes mellitus without complications: Secondary | ICD-10-CM | POA: Insufficient documentation

## 2014-11-13 LAB — BASIC METABOLIC PANEL
ANION GAP: 6 (ref 5–15)
BUN: 26 mg/dL — ABNORMAL HIGH (ref 6–23)
CALCIUM: 9.4 mg/dL (ref 8.4–10.5)
CHLORIDE: 111 meq/L (ref 96–112)
CO2: 25 mmol/L (ref 19–32)
Creatinine, Ser: 1.38 mg/dL — ABNORMAL HIGH (ref 0.50–1.10)
GFR calc Af Amer: 39 mL/min — ABNORMAL LOW (ref >90)
GFR calc non Af Amer: 34 mL/min — ABNORMAL LOW (ref >90)
GLUCOSE: 163 mg/dL — AB (ref 70–99)
POTASSIUM: 4.2 mmol/L (ref 3.5–5.1)
SODIUM: 142 mmol/L (ref 135–145)

## 2014-11-13 LAB — CBC WITH DIFFERENTIAL/PLATELET
Basophils Absolute: 0 10*3/uL (ref 0.0–0.1)
Basophils Relative: 0 % (ref 0–1)
EOS PCT: 2 % (ref 0–5)
Eosinophils Absolute: 0.1 10*3/uL (ref 0.0–0.7)
HCT: 32.2 % — ABNORMAL LOW (ref 36.0–46.0)
Hemoglobin: 10.1 g/dL — ABNORMAL LOW (ref 12.0–15.0)
LYMPHS ABS: 0.8 10*3/uL (ref 0.7–4.0)
Lymphocytes Relative: 14 % (ref 12–46)
MCH: 29 pg (ref 26.0–34.0)
MCHC: 31.4 g/dL (ref 30.0–36.0)
MCV: 92.5 fL (ref 78.0–100.0)
Monocytes Absolute: 0.3 10*3/uL (ref 0.1–1.0)
Monocytes Relative: 5 % (ref 3–12)
NEUTROS ABS: 4.1 10*3/uL (ref 1.7–7.7)
Neutrophils Relative %: 79 % — ABNORMAL HIGH (ref 43–77)
PLATELETS: 227 10*3/uL (ref 150–400)
RBC: 3.48 MIL/uL — AB (ref 3.87–5.11)
RDW: 15.4 % (ref 11.5–15.5)
WBC: 5.2 10*3/uL (ref 4.0–10.5)

## 2014-11-13 LAB — TROPONIN I: Troponin I: <0.03 ng/mL (ref <0.031)

## 2014-11-13 NOTE — Discharge Instructions (Signed)
:   Follow-up with your cardiologist. You have a new left bundle branch block on your EKG. Workup here for the shortness of breath without any significant findings. Suspect that this was exertional in nature and now has resolved. Return for any new or worse symptoms.

## 2014-11-13 NOTE — ED Notes (Signed)
Per EMS, pt went to bend over and became dizzy and sob. Pt denies any dizziness,sob,pain at this time. cbg en route 147. nad noted.

## 2014-11-13 NOTE — ED Notes (Signed)
Pt states she bent over and felt dizzy and sob and decided to come in. Pt denies any dizziness now and denies pain. NAD noted at this time.

## 2014-11-13 NOTE — ED Notes (Signed)
MD at bedside. 

## 2014-11-13 NOTE — ED Provider Notes (Signed)
CSN: [id number]     Arrival date & time 11/13/14  1540 History   First MD Initiated Contact with Patient 11/13/14 1605     Chief Complaint  Patient presents with  . Shortness of Breath     (Consider location/radiation/quality/duration/timing/severity/associated sxs/prior Treatment) Patient is a 80 y.o. female presenting with shortness of breath. The history is provided by the patient.  Shortness of Breath Associated symptoms: no abdominal pain, no chest pain, no fever, no headaches, no rash and no wheezing    patient woke up this morning days find a leak in her roof. She was doing a lot of rapid mopping got very short of breath. EMS called by daughter. Patient also was dizzy and shortness of breath. Patient's symptoms all resolved upon arrival here. Blood sugar was 147. Never had any chest pain. Patient is followed by the old [facility] cardiology. Patient does have a history of diabetes. Patient also has a history of congestive heart failure.  Past Medical History  Diagnosis Date  . Diabetes mellitus without complication   . CHF (congestive heart failure)   . Hypertension   . Hypercholesteremia   . Dyspnea on exertion   . Hx of echocardiogram 04/09/2011    EF 55% Mildly hypertrophic left ventricle with normal systolic function, Moderate (grade II) diastolic dysfunction. Elevated left atrial pressure. No significant valvular abnormalities. No pericardial effusion. Mild to moderate pulmonary arterial hypertension.  [name] History of stress test 07/05/2006    High risk scan cardiac cathe was recommended.  . Dyslipidemia   . Myocardial infarct    Past Surgical History  Procedure Laterality Date  . Abdominal hysterectomy     Family History  Problem Relation Age of Onset  . Cancer Mother   . Heart Problems Father   . Cancer Brother     brain  . Cancer Sister     brain   History  Substance Use Topics  . Smoking status: Never Smoker   . Smokeless tobacco: Not on file  . Alcohol  Use: No   OB History    Gravida Para Term Preterm AB TAB SAB Ectopic Multiple Living   3 3 3             Review of Systems  Constitutional: Positive for fatigue. Negative for fever.  HENT: Negative for congestion.   Eyes: Negative for visual disturbance.  Respiratory: Positive for shortness of breath. Negative for wheezing.   Cardiovascular: Negative for chest pain.  Gastrointestinal: Negative for abdominal pain.  Genitourinary: Negative for dysuria.  Musculoskeletal: Negative for back pain.  Skin: Negative for rash.  Neurological: Negative for headaches.  Hematological: Does not bruise/bleed easily.  Psychiatric/Behavioral: Negative for confusion.      Allergies  Advil and Daypro  Home Medications   Prior to Admission medications   Medication Sig Start Date End Date Taking? Authorizing Provider  amLODipine (NORVASC) 10 MG tablet TAKE ONE (1) TABLET BY MOUTH EVERY DAY 05/23/14  Yes Mihai Croitoru, MD  aspirin EC 81 MG tablet Take 81 mg by mouth daily.   Yes Historical Provider, MD  atorvastatin (LIPITOR) 20 MG tablet Take 20 mg by mouth every morning.    Yes Historical Provider, MD  carvedilol (COREG) 25 MG tablet Take 25 mg by mouth 2 (two) times daily with a meal.    Yes Historical Provider, MD  Choline Fenofibrate (TRILIPIX) 135 MG capsule Take 135 mg by mouth every evening.    Yes Historical Provider, MD  enalapril (VASOTEC) 20 MG tablet  Take 20 mg by mouth daily.   Yes Historical Provider, MD  esomeprazole (NEXIUM) 40 MG capsule Take 40 mg by mouth daily as needed (for hearburn).    Yes Historical Provider, MD  glipiZIDE (GLUCOTROL XL) 2.5 MG 24 hr tablet Take 2.5 mg by mouth daily.   Yes Historical Provider, MD  metFORMIN (GLUCOPHAGE) 500 MG tablet Take 500 mg by mouth 2 (two) times daily.   Yes Historical Provider, MD   BP 118/78 mmHg  Pulse 77  Temp(Src) 98.3 F (36.8 C) (Oral)  Resp 22  Ht 5\' 1"  (1.549 m)  Wt 145 lb (65.772 kg)  BMI 27.41 kg/m2  SpO2  95% Physical Exam  Constitutional: She is oriented to person, place, and time. She appears well-developed and well-nourished. No distress.  HENT:  Head: Normocephalic and atraumatic.  Mouth/Throat: Oropharynx is clear and moist. No oropharyngeal exudate.  Eyes: Conjunctivae and EOM are normal. Pupils are equal, round, and reactive to light.  Neck: Normal range of motion. Neck supple.  Cardiovascular: Regular rhythm and normal heart sounds.   No murmur heard. Pulmonary/Chest: Effort normal and breath sounds normal. No respiratory distress.  Abdominal: Soft. Bowel sounds are normal. There is no tenderness.  Musculoskeletal: Normal range of motion.  Neurological: She is alert and oriented to person, place, and time. No cranial nerve deficit. She exhibits normal muscle tone. Coordination normal.  Skin: Skin is warm. No erythema.  Nursing note and vitals reviewed.   ED Course  Procedures (including critical care time) Labs Review Labs Reviewed  CBC WITH DIFFERENTIAL - Abnormal; Notable for the following:    RBC 3.48 (*)    Hemoglobin 10.1 (*)    HCT 32.2 (*)    Neutrophils Relative % 79 (*)    All other components within normal limits  BASIC METABOLIC PANEL - Abnormal; Notable for the following:    Glucose, Bld 163 (*)    BUN 26 (*)    Creatinine, Ser 1.38 (*)    GFR calc non Af Amer 34 (*)    GFR calc Af Amer 39 (*)    All other components within normal limits  TROPONIN I   Results for orders placed or performed during the hospital encounter of 11/13/14  CBC with Differential  Result Value Ref Range   WBC 5.2 4.0 - 10.5 K/uL   RBC 3.48 (L) 3.87 - 5.11 MIL/uL   Hemoglobin 10.1 (L) 12.0 - 15.0 g/dL   HCT 32.2 (L) 36.0 - 46.0 %   MCV 92.5 78.0 - 100.0 fL   MCH 29.0 26.0 - 34.0 pg   MCHC 31.4 30.0 - 36.0 g/dL   RDW 15.4 11.5 - 15.5 %   Platelets 227 150 - 400 K/uL   Neutrophils Relative % 79 (H) 43 - 77 %   Neutro Abs 4.1 1.7 - 7.7 K/uL   Lymphocytes Relative 14 12 - 46 %    Lymphs Abs 0.8 0.7 - 4.0 K/uL   Monocytes Relative 5 3 - 12 %   Monocytes Absolute 0.3 0.1 - 1.0 K/uL   Eosinophils Relative 2 0 - 5 %   Eosinophils Absolute 0.1 0.0 - 0.7 K/uL   Basophils Relative 0 0 - 1 %   Basophils Absolute 0.0 0.0 - 0.1 K/uL  Basic metabolic panel  Result Value Ref Range   Sodium 142 135 - 145 mmol/L   Potassium 4.2 3.5 - 5.1 mmol/L   Chloride 111 96 - 112 mEq/L   CO2 25 19 -  32 mmol/L   Glucose, Bld 163 (H) 70 - 99 mg/dL   BUN 26 (H) 6 - 23 mg/dL   Creatinine, Ser 1.38 (H) 0.50 - 1.10 mg/dL   Calcium 9.4 8.4 - 10.5 mg/dL   GFR calc non Af Amer 34 (L) >90 mL/min   GFR calc Af Amer 39 (L) >90 mL/min   Anion gap 6 5 - 15  Troponin I  Result Value Ref Range   Troponin I <0.03 <0.031 ng/mL     Imaging Review Dg Chest Portable 1 View  11/13/2014   CLINICAL DATA:  Shortness of breath. Hypertension. Congestive heart failure.  EXAM: PORTABLE CHEST - 1 VIEW  COMPARISON:  12/16/2013  FINDINGS: The patient is rotated to the Right on today's radiograph, reducing diagnostic sensitivity and specificity. Stable enlargement of the cardiopericardial silhouette and tortuosity of the thoracic aorta. Suspected enlargement of the left atrium causing the density to the right of the spine.  IMPRESSION: 1. Cardiomegaly (stable) with suspected left atrial enlargement. 2. Atherosclerotic aortic arch. 3. Thoracic spondylosis. 4. No acute findings.   Electronically Signed   By: Sherryl Barters M.D.   On: 11/13/2014 16:02     EKG Interpretation   Date/Time:  Wednesday November 13 2014 16:43:17 EST Ventricular Rate:  86 PR Interval:  195 QRS Duration: 134 QT Interval:  394 QTC Calculation: 471 R Axis:   31 Text Interpretation:  Sinus rhythm Left bundle branch block No significant  change since last tracing Confirmed by Xzavien Harada  MD, Shaely Gadberry [id number]) on  11/13/2014 5:15:09 PM      MDM   Final diagnoses:  New left bundle branch block (LBBB)   shortness of  breath.   Workup for the shortness of breath without significant findings. Seem to be exertional patient with room air sats here in the emergency department at 98% or greater. Patient was doing a lot of mopping this probably resulted in the shortness of breath.  In addition EKG shows a new left bundle branch block. Not sure when this started. Patient's troponin is negative. Patient never had any chest pain. Patient is followed by cardiology. Patient will follow-up with her cardiologist about this.      Fredia Sorrow, MD 11/13/14 1910

## 2015-01-13 DIAGNOSIS — L989 Disorder of the skin and subcutaneous tissue, unspecified: Secondary | ICD-10-CM | POA: Diagnosis not present

## 2015-01-13 DIAGNOSIS — D0439 Carcinoma in situ of skin of other parts of face: Secondary | ICD-10-CM | POA: Diagnosis not present

## 2015-01-14 DIAGNOSIS — E119 Type 2 diabetes mellitus without complications: Secondary | ICD-10-CM | POA: Diagnosis not present

## 2015-01-14 DIAGNOSIS — E782 Mixed hyperlipidemia: Secondary | ICD-10-CM | POA: Diagnosis not present

## 2015-01-14 DIAGNOSIS — I10 Essential (primary) hypertension: Secondary | ICD-10-CM | POA: Diagnosis not present

## 2015-01-14 DIAGNOSIS — D0430 Carcinoma in situ of skin of unspecified part of face: Secondary | ICD-10-CM | POA: Diagnosis not present

## 2015-01-24 DIAGNOSIS — T8131XA Disruption of external operation (surgical) wound, not elsewhere classified, initial encounter: Secondary | ICD-10-CM | POA: Diagnosis not present

## 2015-01-27 ENCOUNTER — Ambulatory Visit: Payer: Medicare Other | Admitting: Cardiovascular Disease

## 2015-02-07 DIAGNOSIS — D0439 Carcinoma in situ of skin of other parts of face: Secondary | ICD-10-CM | POA: Diagnosis not present

## 2015-02-19 ENCOUNTER — Ambulatory Visit (INDEPENDENT_AMBULATORY_CARE_PROVIDER_SITE_OTHER): Payer: Medicare Other | Admitting: Cardiovascular Disease

## 2015-02-19 VITALS — BP 140/72 | HR 70 | Resp 16 | Ht 60.0 in | Wt 151.5 lb

## 2015-02-19 DIAGNOSIS — I10 Essential (primary) hypertension: Secondary | ICD-10-CM | POA: Diagnosis not present

## 2015-02-19 DIAGNOSIS — I2510 Atherosclerotic heart disease of native coronary artery without angina pectoris: Secondary | ICD-10-CM | POA: Diagnosis not present

## 2015-02-19 DIAGNOSIS — I5032 Chronic diastolic (congestive) heart failure: Secondary | ICD-10-CM | POA: Diagnosis not present

## 2015-02-19 DIAGNOSIS — E785 Hyperlipidemia, unspecified: Secondary | ICD-10-CM

## 2015-02-19 NOTE — Progress Notes (Signed)
Patient ID: Robin Guzman, female   DOB: 12-14-1927, 81 y.o.   MRN: [id number]     Cardiology Office Note   Date:  02/23/2015   ID:  Robin Guzman, DOB 11/28/1927, MRN [id number]  PCP:  Delphina Cahill, MD  Cardiologist:   Sanda Klein, MD   Chief Complaint  Patient presents with  . Follow-up    One year:  No complaints of chest pain.  SOB with climbing stair or hurrying too much. No edema or dizziness.      History of Present Illness: Robin Guzman is a 81 y.o. female who presents for follow up for CAD and HFPEF with background HTN, DM and hyperlipidemia and a recently developed LBBB. She had a normal nuclear study in February 2015. Echo in January 2015 showed LVH, normal LVEF and diastolic dysfunction. Her event monitor showed only PACs and a brief 10-beat run of atrial tachycardia.  She has no cardiac complaints. She develops dyspnea only "if she rushes".    Past Medical History  Diagnosis Date  . Diabetes mellitus without complication   . CHF (congestive heart failure)   . Hypertension   . Hypercholesteremia   . Dyspnea on exertion   . Hx of echocardiogram 04/09/2011    EF 55% Mildly hypertrophic left ventricle with normal systolic function, Moderate (grade II) diastolic dysfunction. Elevated left atrial pressure. No significant valvular abnormalities. No pericardial effusion. Mild to moderate pulmonary arterial hypertension.  [name] History of stress test 07/05/2006    High risk scan cardiac cathe was recommended.  . Dyslipidemia   . Myocardial infarct     Past Surgical History  Procedure Laterality Date  . Abdominal hysterectomy       Current Outpatient Prescriptions  Medication Sig Dispense Refill  . amLODipine (NORVASC) 10 MG tablet TAKE ONE (1) TABLET BY MOUTH EVERY DAY 30 tablet 8  . aspirin EC 81 MG tablet Take 81 mg by mouth daily.    [name] atorvastatin (LIPITOR) 20 MG tablet Take 20 mg by mouth every morning.     . carvedilol (COREG) 25 MG tablet Take 25 mg by  mouth 2 (two) times daily with a meal.     . Choline Fenofibrate (TRILIPIX) 135 MG capsule Take 135 mg by mouth every evening.     . enalapril (VASOTEC) 20 MG tablet Take 20 mg by mouth daily.    [name] esomeprazole (NEXIUM) 40 MG capsule Take 40 mg by mouth daily as needed (for hearburn).     [name] glipiZIDE (GLUCOTROL XL) 2.5 MG 24 hr tablet Take 2.5 mg by mouth daily.    . metFORMIN (GLUCOPHAGE) 500 MG tablet Take 500 mg by mouth 2 (two) times daily.     No current facility-administered medications for this visit.    Allergies:   Advil and Daypro    Social History:  The patient  reports that she has never smoked. She does not have any smokeless tobacco history on file. She reports that she does not drink alcohol or use illicit drugs.   Family History:  The patient's family history includes Cancer in her brother, mother, and sister; Heart Problems in her father.    ROS:  Please see the history of present illness.    Otherwise, review of systems positive for none.   All other systems are reviewed and negative.    PHYSICAL EXAM: VS:  BP 140/72 mmHg  Pulse 70  Resp 16  Ht 5' (1.524 m)  Wt 151 lb 8 oz (68.72  kg)  BMI 29.59 kg/m2 , BMI Body mass index is 29.59 kg/(m^2).  General: Alert, oriented x3, no distress Head: no evidence of trauma, PERRL, EOMI, no exophtalmos or lid lag, no myxedema, no xanthelasma; normal ears, nose and oropharynx Neck: normal jugular venous pulsations and no hepatojugular reflux; brisk carotid pulses without delay and no carotid bruits Chest: clear to auscultation, no signs of consolidation by percussion or palpation, normal fremitus, symmetrical and full respiratory excursions Cardiovascular: normal position and quality of the apical impulse, regular rhythm, normal first and second heart sounds, no murmurs, rubs or gallops Abdomen: no tenderness or distention, no masses by palpation, no abnormal pulsatility or arterial bruits, normal bowel sounds, no  hepatosplenomegaly Extremities: no clubbing, cyanosis or edema; 2+ radial, ulnar and brachial pulses bilaterally; 2+ right femoral, posterior tibial and dorsalis pedis pulses; 2+ left femoral, posterior tibial and dorsalis pedis pulses; no subclavian or femoral bruits Neurological: grossly nonfocal Psych: euthymic mood, full affect   EKG:  EKG is not ordered today. The ekg ordered 11/13/2014 shows NSR, LBBB (new from earlier same year_   Recent Labs: 11/13/2014: BUN 26*; Creatinine 1.38*; Hemoglobin 10.1*; Platelets 227; Potassium 4.2; Sodium 142    Lipid Panel Labs from Dr. Nevada Crane 07/2014 Chol 132, TG 141, HDL 33, LDL 71 HgbA1c 6.5% Creat 1.24    Wt Readings from Last 3 Encounters:  02/19/15 151 lb 8 oz (68.72 kg)  11/13/14 145 lb (65.772 kg)  03/27/14 145 lb (65.772 kg)      Other studies Reviewed: Additional studies/ records that were reviewed today include: ED records December 2015.  ASSESSMENT AND PLAN:  1.  CAD - mild by caoronary angio 2007, normal perfusion study 2015. Asymptomatic  2. Hyperlipidemia and DM - good glycemic control and LDL, low HDL  3. HTN - BP is fair, ideally SBP<135, but she already on high doses of amlodipine, carvedilol and ACEi. I would not add diuretic. Encourage tighter sodium restriction  4. LBBB  5. Chronic diastolic HF - NYHA class 1-2, euvolemic by exam   Current medicines are reviewed at length with the patient today.  The patient does not have concerns regarding medicines.  The following changes have been made:  no change  Labs/ tests ordered today include:  No orders of the defined types were placed in this encounter.     Patient Instructions  Dr. Sallyanne Kuster recommends that you schedule a follow-up appointment in: One year.      Mikael Spray, MD  02/23/2015 10:22 AM    Sanda Klein, MD, [facility] [phone] office [phone] pager

## 2015-02-19 NOTE — Patient Instructions (Signed)
Dr. Croitoru recommends that you schedule a follow-up appointment in: One year.   

## 2015-02-23 ENCOUNTER — Encounter: Payer: Self-pay | Admitting: Cardiovascular Disease

## 2015-02-27 ENCOUNTER — Other Ambulatory Visit: Payer: Self-pay | Admitting: Cardiovascular Disease

## 2015-07-09 DIAGNOSIS — Z23 Encounter for immunization: Secondary | ICD-10-CM | POA: Diagnosis not present

## 2015-08-12 DIAGNOSIS — M25569 Pain in unspecified knee: Secondary | ICD-10-CM | POA: Diagnosis not present

## 2015-08-26 DIAGNOSIS — M25561 Pain in right knee: Secondary | ICD-10-CM | POA: Diagnosis not present

## 2015-09-05 DIAGNOSIS — I10 Essential (primary) hypertension: Secondary | ICD-10-CM | POA: Diagnosis not present

## 2015-09-05 DIAGNOSIS — E119 Type 2 diabetes mellitus without complications: Secondary | ICD-10-CM | POA: Diagnosis not present

## 2015-09-10 DIAGNOSIS — E119 Type 2 diabetes mellitus without complications: Secondary | ICD-10-CM | POA: Diagnosis not present

## 2015-09-10 DIAGNOSIS — R944 Abnormal results of kidney function studies: Secondary | ICD-10-CM | POA: Diagnosis not present

## 2015-09-10 DIAGNOSIS — I10 Essential (primary) hypertension: Secondary | ICD-10-CM | POA: Diagnosis not present

## 2015-09-10 DIAGNOSIS — E785 Hyperlipidemia, unspecified: Secondary | ICD-10-CM | POA: Diagnosis not present

## 2015-09-10 DIAGNOSIS — E875 Hyperkalemia: Secondary | ICD-10-CM | POA: Diagnosis not present

## 2015-09-11 DIAGNOSIS — H5203 Hypermetropia, bilateral: Secondary | ICD-10-CM | POA: Diagnosis not present

## 2015-09-11 DIAGNOSIS — H524 Presbyopia: Secondary | ICD-10-CM | POA: Diagnosis not present

## 2015-09-11 DIAGNOSIS — H52222 Regular astigmatism, left eye: Secondary | ICD-10-CM | POA: Diagnosis not present

## 2015-09-11 DIAGNOSIS — H3530 Unspecified macular degeneration: Secondary | ICD-10-CM | POA: Diagnosis not present

## 2016-01-14 DIAGNOSIS — E119 Type 2 diabetes mellitus without complications: Secondary | ICD-10-CM | POA: Diagnosis not present

## 2016-01-14 DIAGNOSIS — E782 Mixed hyperlipidemia: Secondary | ICD-10-CM | POA: Diagnosis not present

## 2016-01-16 DIAGNOSIS — D509 Iron deficiency anemia, unspecified: Secondary | ICD-10-CM | POA: Diagnosis not present

## 2016-01-16 DIAGNOSIS — N183 Chronic kidney disease, stage 3 (moderate): Secondary | ICD-10-CM | POA: Diagnosis not present

## 2016-01-16 DIAGNOSIS — E1122 Type 2 diabetes mellitus with diabetic chronic kidney disease: Secondary | ICD-10-CM | POA: Diagnosis not present

## 2016-01-16 DIAGNOSIS — I10 Essential (primary) hypertension: Secondary | ICD-10-CM | POA: Diagnosis not present

## 2016-01-16 DIAGNOSIS — E782 Mixed hyperlipidemia: Secondary | ICD-10-CM | POA: Diagnosis not present

## 2016-01-26 ENCOUNTER — Emergency Department (HOSPITAL_COMMUNITY)
Admission: EM | Admit: 2016-01-26 | Discharge: 2016-01-26 | Disposition: A | Discharge status: Home / Self Care | Payer: Medicare Other | Attending: Emergency Medicine | Admitting: Emergency Medicine

## 2016-01-26 ENCOUNTER — Encounter (HOSPITAL_COMMUNITY): Payer: Self-pay | Admitting: *Deleted

## 2016-01-26 ENCOUNTER — Emergency Department (HOSPITAL_COMMUNITY): Payer: Medicare Other

## 2016-01-26 DIAGNOSIS — Z79899 Other long term (current) drug therapy: Secondary | ICD-10-CM | POA: Diagnosis not present

## 2016-01-26 DIAGNOSIS — I509 Heart failure, unspecified: Secondary | ICD-10-CM | POA: Diagnosis not present

## 2016-01-26 DIAGNOSIS — I471 Supraventricular tachycardia: Secondary | ICD-10-CM | POA: Insufficient documentation

## 2016-01-26 DIAGNOSIS — E119 Type 2 diabetes mellitus without complications: Secondary | ICD-10-CM | POA: Diagnosis not present

## 2016-01-26 DIAGNOSIS — E782 Mixed hyperlipidemia: Secondary | ICD-10-CM | POA: Diagnosis not present

## 2016-01-26 DIAGNOSIS — Z7984 Long term (current) use of oral hypoglycemic drugs: Secondary | ICD-10-CM | POA: Insufficient documentation

## 2016-01-26 DIAGNOSIS — R531 Weakness: Secondary | ICD-10-CM

## 2016-01-26 DIAGNOSIS — I110 Hypertensive heart disease with heart failure: Secondary | ICD-10-CM | POA: Diagnosis not present

## 2016-01-26 DIAGNOSIS — R404 Transient alteration of awareness: Secondary | ICD-10-CM | POA: Diagnosis not present

## 2016-01-26 DIAGNOSIS — R069 Unspecified abnormalities of breathing: Secondary | ICD-10-CM | POA: Diagnosis not present

## 2016-01-26 DIAGNOSIS — R0602 Shortness of breath: Secondary | ICD-10-CM | POA: Diagnosis not present

## 2016-01-26 DIAGNOSIS — I252 Old myocardial infarction: Secondary | ICD-10-CM | POA: Diagnosis not present

## 2016-01-26 LAB — TSH: TSH: 1.664 u[IU]/mL (ref 0.350–4.500)

## 2016-01-26 LAB — CBC
HCT: 36.6 % (ref 36.0–46.0)
Hemoglobin: 11.8 g/dL — ABNORMAL LOW (ref 12.0–15.0)
MCH: 29.9 pg (ref 26.0–34.0)
MCHC: 32.2 g/dL (ref 30.0–36.0)
MCV: 92.7 fL (ref 78.0–100.0)
Platelets: 211 10*3/uL (ref 150–400)
RBC: 3.95 MIL/uL (ref 3.87–5.11)
RDW: 14.8 % (ref 11.5–15.5)
WBC: 5.7 10*3/uL (ref 4.0–10.5)

## 2016-01-26 LAB — BASIC METABOLIC PANEL
Anion gap: 6 (ref 5–15)
BUN: 19 mg/dL (ref 6–20)
CO2: 26 mmol/L (ref 22–32)
Calcium: 9.2 mg/dL (ref 8.9–10.3)
Chloride: 107 mmol/L (ref 101–111)
Creatinine, Ser: 1.2 mg/dL — ABNORMAL HIGH (ref 0.44–1.00)
GFR calc Af Amer: 46 mL/min — ABNORMAL LOW (ref >60)
GFR calc non Af Amer: 40 mL/min — ABNORMAL LOW (ref >60)
Glucose, Bld: 163 mg/dL — ABNORMAL HIGH (ref 65–99)
Potassium: 4.3 mmol/L (ref 3.5–5.1)
Sodium: 139 mmol/L (ref 135–145)

## 2016-01-26 LAB — TROPONIN I: Troponin I: <0.03 ng/mL (ref <0.031)

## 2016-01-26 LAB — MAGNESIUM: MAGNESIUM: 1.8 mg/dL (ref 1.7–2.4)

## 2016-01-26 MED ORDER — DILTIAZEM HCL 30 MG PO TABS: 30.0000 mg | ORAL_TABLET | Freq: Two times a day (BID) | ORAL | Status: DC

## 2016-01-26 MED ORDER — DILTIAZEM HCL 30 MG PO TABS
30.0000 mg | ORAL_TABLET | Freq: Once | ORAL | Status: AC
  Administered 2016-01-26: 30 mg via ORAL
  Filled 2016-01-26: qty 1

## 2016-01-26 NOTE — ED Notes (Signed)
Patient with no complaints at this time. Respirations even and unlabored. Skin warm/dry. Discharge instructions reviewed with patient at this time. Patient given opportunity to voice concerns/ask questions. IV removed per policy and band-aid applied to site. Patient discharged at this time and left Emergency Department via wheelchair.  

## 2016-01-26 NOTE — ED Notes (Signed)
Patient with HR increase to 140s from NSR rate of 75. Converted in approximately 30 seconds back to NSR. Patient denied feeling CP or heart racing with episode. EKG obtained at 0959 that shows both the fast rhythm and rate along with her conversion back to NSR. Patient remains on monitor. Fast rhythm appears to be SVT. MD given new EKG and notified of occurrence.

## 2016-01-26 NOTE — Progress Notes (Addendum)
Patient discussed with Dr Wilson Singer, presented with sudden onset weakness and fatigue. Transient wide complex rhythm captures on EKG most consistent with SVT with aberrancy. She has had intermittent LBBB in the past with negative extensive workup including nuclear stress and echo. Prior cardiac monitor showed PACs and short run of atach, showing she does have the propensity for PSVT. She is on coreg 25mg  bid, would start dilt 30mg  bid and ok for discharge with outpatient cardiology f/u in 2-3 weeks. Will add on Mg and TSH labs, would monitor for another hour and if no significant recurrent symptoms or sustained arrhythmias ok for discharge. We will have her follow up in our clinic in 2-3 weeks, and later to be reestablished with Dr Sallyanne Kuster.    Zandra Abts MD

## 2016-01-26 NOTE — ED Provider Notes (Signed)
CSN: [id number]     Arrival date & time 01/26/16  [id number] History  By signing my name below, I, Robin Guzman, attest that this documentation has been prepared under the direction and in the presence of Virgel Manifold, MD. Electronically Signed: Meriel Guzman, ED Scribe. 01/26/2016. 9:36 AM.   Chief Complaint  Patient presents with  . Shortness of Breath   The history is provided by the patient. No language interpreter was used.   HPI Comments: Robin Guzman is a 82 y.o. female, with a h/o CHF, dyspnea on exertion, MI, DM, HTN, and HCL, brought in by ambulance, who presents to the Emergency Department complaining of sudden onset generalized weakness that occurred this morning after waking and going about normal daily activities.  Pt states she began to feel weak all over after and felt like she needed to sit down or she would fall. Episodes maybe lasted a couple minutes then improved. She currently feels much better but not quite back to normal. She denies any other symptoms such as pain, dyspnea or palpitations. She has felt fine the last few days and when she woke up. She denies experiencing any pain, blood in stool or melena, and urinary symptoms.   Past Medical History  Diagnosis Date  . Diabetes mellitus without complication ([place])   . CHF (congestive heart failure) ([place])   . Hypertension   . Hypercholesteremia   . Dyspnea on exertion   . Hx of echocardiogram 04/09/2011    EF 55% Mildly hypertrophic left ventricle with normal systolic function, Moderate (grade II) diastolic dysfunction. Elevated left atrial pressure. No significant valvular abnormalities. No pericardial effusion. Mild to moderate pulmonary arterial hypertension.  [name] History of stress test 07/05/2006    High risk scan cardiac cathe was recommended.  . Dyslipidemia   . Myocardial infarct [facility])    Past Surgical History  Procedure Laterality Date  . Abdominal hysterectomy     Family History  Problem Relation Age of  Onset  . Cancer Mother   . Heart Problems Father   . Cancer Brother     brain  . Cancer Sister     brain   Social History  Substance Use Topics  . Smoking status: Never Smoker   . Smokeless tobacco: None  . Alcohol Use: No   OB History    Gravida Para Term Preterm AB TAB SAB Ectopic Multiple Living   3 3 3             Review of Systems  Constitutional: Negative for fever.  Respiratory: Negative for shortness of breath.   Cardiovascular: Negative for chest pain.  Gastrointestinal: Positive for nausea. Negative for vomiting, abdominal pain and blood in stool.  Genitourinary: Negative for urgency, frequency, hematuria, decreased urine volume and difficulty urinating.  Musculoskeletal: Negative for myalgias and arthralgias.  Neurological: Positive for weakness ( generalized). Negative for dizziness and light-headedness.  A complete 10 system review of systems was obtained and is otherwise negative except at noted in the HPI and PMH.  Allergies  Advil and Daypro  Home Medications   Prior to Admission medications   Medication Sig Start Date End Date Taking? Authorizing Provider  amLODipine (NORVASC) 10 MG tablet TAKE ONE (1) TABLET EACH DAY 02/27/15   Mihai Croitoru, MD  aspirin EC 81 MG tablet Take 81 mg by mouth daily.    Historical Provider, MD  atorvastatin (LIPITOR) 20 MG tablet Take 20 mg by mouth every morning.     Historical Provider, MD  carvedilol (COREG) 25 MG tablet Take 25 mg by mouth 2 (two) times daily with a meal.     Historical Provider, MD  Choline Fenofibrate (TRILIPIX) 135 MG capsule Take 135 mg by mouth every evening.     Historical Provider, MD  enalapril (VASOTEC) 20 MG tablet Take 20 mg by mouth daily.    Historical Provider, MD  esomeprazole (NEXIUM) 40 MG capsule Take 40 mg by mouth daily as needed (for hearburn).     Historical Provider, MD  glipiZIDE (GLUCOTROL XL) 2.5 MG 24 hr tablet Take 2.5 mg by mouth daily.    Historical Provider, MD  metFORMIN  (GLUCOPHAGE) 500 MG tablet Take 500 mg by mouth 2 (two) times daily.    Historical Provider, MD   BP 186/85 mmHg  Pulse 73  Temp(Src) 97.8 F (36.6 C) (Oral)  Resp 16  Ht 5' (1.524 m)  Wt 145 lb (65.772 kg)  BMI 28.32 kg/m2  SpO2 97% Physical Exam  Constitutional: She is oriented to person, place, and time. She appears well-developed and well-nourished. No distress.  HENT:  Head: Normocephalic and atraumatic.  Eyes: EOM are normal.  Neck: Normal range of motion.  Cardiovascular: Normal rate, regular rhythm and normal heart sounds.   Pulmonary/Chest: Effort normal and breath sounds normal.  Abdominal: Soft. She exhibits no distension. There is no tenderness.  Musculoskeletal: Normal range of motion.  Neurological: She is alert and oriented to person, place, and time. She has normal reflexes. She displays normal reflexes. No cranial nerve deficit. She exhibits normal muscle tone. Coordination normal.  Skin: Skin is warm and dry.  Psychiatric: She has a normal mood and affect. Judgment normal.  Nursing note and vitals reviewed.   ED Course  Procedures  DIAGNOSTIC STUDIES: Oxygen Saturation is 97% on RA, normal by my interpretation.    COORDINATION OF CARE: 9:19 AM Discussed treatment plan with pt at bedside and pt agreed to plan.  Labs Review Labs Reviewed  BASIC METABOLIC PANEL - Abnormal; Notable for the following:    Glucose, Bld 163 (*)    Creatinine, Ser 1.20 (*)    GFR calc non Af Amer 40 (*)    GFR calc Af Amer 46 (*)    All other components within normal limits  CBC - Abnormal; Notable for the following:    Hemoglobin 11.8 (*)    All other components within normal limits  TROPONIN I    Imaging Review Dg Chest 2 View  01/26/2016  CLINICAL DATA:  Shortness of breath and weakness beginning today. EXAM: CHEST - 2 VIEW COMPARISON:  One-view chest x-ray 11/13/2014. FINDINGS: The heart is enlarged. Mild pulmonary vascular congestion is present without frank edema.  Bibasilar atelectasis is noted. There are no definite effusions. Atherosclerotic calcifications are present within the thoracic aorta. No focal airspace consolidation is evident. Degenerative changes are present throughout the thoracic spine. IMPRESSION: 1. Cardiomegaly and mild pulmonary vascular congestion may represent early congestive heart failure. 2. Mild bibasilar airspace disease likely reflects atelectasis. 3. No other significant focal airspace consolidation is present. Electronically Signed   By: San Morelle M.D.   On: 01/26/2016 10:03   I have personally reviewed and evaluated these images and lab results as part of my medical decision-making.   EKG Interpretation   Date/Time:  Monday January 26 2016 09:59:44 EST Ventricular Rate:  133 PR Interval:  96 QRS Duration: 127 QT Interval:  357 QTC Calculation: 531 R Axis:   -62 Text Interpretation:  wide complex  tachycardia converting to sinus rhythm  Rate related LBBB?  Confirmed by Wilson Singer  MD, [place] ([id number]) on 01/26/2016  10:18:13 AM      MDM   Final diagnoses:  Generalized weakness  SVT (supraventricular tachycardia) ([place])    87yF with weakness. Described sudden onset of generalized weakness. While in ED had same symptoms during which had a regular, wide complex tachycardia. Initial EKG with normal ventricular conduction time but does have hx of LBBB.  Some type of SVT with rate related block? Less likely vtach? Regardless, she did not complain of any CP or dizziness but similar "weak" feeling she had earlier today.  Previous evaluations by cardiology after hospitalization early 2015 for near syncope. She had an outpatient nuclear perfusion study which showedno evidence of significant coronary insufficiency. She wore a 30 day event monitor that showed occasional PACs and a single brief run of nonsustained atrial tachycardia (lasting less than 10 seconds) but apparently was asymptomatic with this.   Discussed with Dr  Harl Bowie, cardiology. Suspects SVT with aberrancy. Check TSH/mag. Recommending diltiazem 30 mg BID. Will continue to observe in ED for a little while. Likely DC with outpt cardiology follow-up in the next couple weeks.   I personally preformed the services scribed in my presence. The recorded information has been reviewed is accurate. Virgel Manifold, MD.   Virgel Manifold, MD 01/29/16 [phone]

## 2016-01-26 NOTE — ED Notes (Signed)
Pt comes in by EMS for shortness of breath and generalized weakness. Pt verbalizes she was fine yesterday and woke up like this today. Pt shows no signs of difficulty breathing upon triage.

## 2016-01-26 NOTE — Discharge Instructions (Signed)
Paroxysmal Supraventricular Tachycardia Paroxysmal supraventricular tachycardia (PSVT) is a type of abnormal heart rhythm. It causes your heart to beat very quickly and then suddenly stop beating so quickly. A normal heart rate is 60-100 beats per minute. During an episode of PSVT, your heart rate may be 150-250 beats per minute. This can make you feel light-headed and short of breath. An episode of PSVT can be frightening. It is usually not dangerous. The heart has four chambers. All chambers need to work together for the heart to beat effectively. A normal heartbeat usually starts in the right upper chamber of the heart (atrium) when an area (sinoatrial node) puts out an electrical signal that spreads to the other chambers. People with PSVT may have abnormal electrical pathways, or they may have other areas in the upper chambers that send out electrical signals. The result is a very rapid heartbeat. When your heart beats very quickly, it does not have time to fill completely with blood. When PSVT happens often or it lasts for long periods, it can lead to heart weakness and failure. Most people with PSVT do not have any other heart disease. CAUSES Abnormal electrical activity in the heart causes PSVT. It is not known why some people get PSVT and others do not. RISK FACTORS You may be more likely to have PSVT if:  You are 20-30 years old.  You are a woman. Other factors that may increase your chances of an attack include:  Stress.  Being tired.  Smoking.  Stimulant drugs.  Alcoholic drinks.  Caffeine.  Pregnancy. SIGNS AND SYMPTOMS A mild episode of PSVT may cause no symptoms. If you do have signs and symptoms, they may include:  A pounding heart.  Feeling of skipped heartbeats (palpitations).  Weakness.  Shortness of breath.  Tightness or pain in your chest.  Light-headedness.  Anxiety.  Dizziness.  Sweating.  Nausea.  A fainting spell. DIAGNOSIS Your health care  provider may suspect PSVT if you have symptoms that come and go. The health care provider will do a physical exam. If you are having an episode during the exam, the health care provider may be able to diagnose PSVT by listening to your heart and feeling your pulse. Tests may also be done, including:  An electrical study of your heart (electrocardiogram, or ECG).  A test in which you wear a portable ECG monitor all day (Holter monitor) or for several days (event monitor).  A test that involves taking an image of your heart using sound waves (echocardiogram) to rule out other causes of a fast heart rate. TREATMENT You may not need treatment if episodes of PSVT do not happen often or if they do not cause symptoms. If PSVT episodes do cause symptoms, your health care provider may first suggest trying a self-treatment called vagus nerve stimulation. The vagus nerve extends down from the brain. It regulates certain body functions. Stimulating this nerve can slow down the heart. Your health care provider can teach you ways to do this. You may need to try a few ways to find what works best for you. Options include:  Holding your breath and pushing, as though you are having a bowel movement.  Massaging an area on one side of your neck below your jaw.  Bending forward with your head between your legs.  Bending forward with your head between your legs and coughing.  Massaging your eyeballs with your eyes closed. If vagus nerve stimulation does not work, other treatment options include:    Medicines to prevent an attack.  Being treated in the hospital with medicine or electric shock to stop an attack (cardioversion). This treatment can include:  Getting medicine through an IV line.  Having a small electric shock delivered to your heart. You will be given medicine to make you sleep through this procedure.  If you have frequent episodes with symptoms, you may need a procedure to get rid of the faulty  areas of your heart (radiofrequency ablation) and end the episodes of PSVT. In this procedure:  A long, thin tube (catheter) is passed through one of your veins into your heart.  Energy directed through the catheter eliminates the areas of your heart that are causing abnormal electric stimulation. HOME CARE INSTRUCTIONS  Take medicines only as directed by your health care provider.  Do not use caffeine in any form if caffeine triggers episodes of PSVT. Otherwise, consume caffeine in moderation. This means no more than a few cups of coffee or the equivalent each day.  Do not drink alcohol if alcohol triggers episodes of PSVT. Otherwise, limit alcohol intake to no more than 1 drink per day for nonpregnant women and 2 drinks per day for men. One drink equals 12 ounces of beer, 5 ounces of wine, or 1 ounces of hard liquor.  Do not use any tobacco products, including cigarettes, chewing tobacco, or electronic cigarettes. If you need help quitting, ask your health care provider.  Try to get at least 7 hours of sleep each night.  Find healthy ways to manage stress.  Perform vagus nerve stimulation as directed by your health care provider.  Maintain a healthy weight.  Get some exercise on most days. Ask your health care provider to suggest some good activities for you. SEEK MEDICAL CARE IF:  You are having episodes of PSVT more often, or they are lasting longer.  Vagus nerve stimulation is no longer helping.  You have new symptoms during an episode. SEEK IMMEDIATE MEDICAL CARE IF:  You have chest pain or trouble breathing.  You have an episode of PSVT that has lasted longer than 20 minutes.  You have passed out from an episode of PSVT. These symptoms may represent a serious problem that is an emergency. Do not wait to see if the symptoms will go away. Get medical help right away. Call your local emergency services (911 in the [place] Do not drive yourself to the hospital.   This  information is not intended to replace advice given to you by your health care provider. Make sure you discuss any questions you have with your health care provider.   Document Released: 11/08/2005 Document Revised: 11/29/2014 Document Reviewed: 04/18/2014 Elsevier Interactive Patient Education 2016 Else[employer] Inc.  

## 2016-01-29 ENCOUNTER — Emergency Department (HOSPITAL_COMMUNITY)
Admission: EM | Admit: 2016-01-29 | Discharge: 2016-01-29 | Disposition: A | Discharge status: Home / Self Care | Payer: Medicare Other | Attending: Dermatology | Admitting: Dermatology

## 2016-01-29 ENCOUNTER — Encounter (HOSPITAL_COMMUNITY): Payer: Self-pay | Admitting: Emergency Medicine

## 2016-01-29 DIAGNOSIS — I252 Old myocardial infarction: Secondary | ICD-10-CM | POA: Diagnosis not present

## 2016-01-29 DIAGNOSIS — E7800 Pure hypercholesterolemia, unspecified: Secondary | ICD-10-CM | POA: Insufficient documentation

## 2016-01-29 DIAGNOSIS — I509 Heart failure, unspecified: Secondary | ICD-10-CM | POA: Insufficient documentation

## 2016-01-29 DIAGNOSIS — Z5321 Procedure and treatment not carried out due to patient leaving prior to being seen by health care provider: Secondary | ICD-10-CM | POA: Diagnosis not present

## 2016-01-29 DIAGNOSIS — I110 Hypertensive heart disease with heart failure: Secondary | ICD-10-CM | POA: Insufficient documentation

## 2016-01-29 DIAGNOSIS — R000 Tachycardia, unspecified: Secondary | ICD-10-CM | POA: Diagnosis not present

## 2016-01-29 NOTE — ED Notes (Signed)
C/o of rapid heart rate, weakness and SOB.  Denies any chest pain.

## 2016-02-09 ENCOUNTER — Ambulatory Visit (INDEPENDENT_AMBULATORY_CARE_PROVIDER_SITE_OTHER): Payer: Medicare Other | Admitting: Adult Health

## 2016-02-09 ENCOUNTER — Encounter: Payer: Self-pay | Admitting: Adult Health

## 2016-02-09 VITALS — BP 124/74 | HR 71 | Wt 150.0 lb

## 2016-02-09 DIAGNOSIS — I10 Essential (primary) hypertension: Secondary | ICD-10-CM | POA: Diagnosis not present

## 2016-02-09 DIAGNOSIS — I471 Supraventricular tachycardia: Secondary | ICD-10-CM

## 2016-02-09 DIAGNOSIS — I429 Cardiomyopathy, unspecified: Secondary | ICD-10-CM | POA: Diagnosis not present

## 2016-02-09 MED ORDER — DILTIAZEM HCL 30 MG PO TABS: 30.0000 mg | ORAL_TABLET | Freq: Two times a day (BID) | ORAL | Status: DC

## 2016-02-09 NOTE — Progress Notes (Deleted)
Name: Robin Guzman    DOB: [birth year]  Age: 82 y.o.  MR#: [id number]       PCP:  Wende Neighbors, MD      Insurance: Payor: MEDICARE / Plan: MEDICARE PART A AND B / Product Type: *No Product type* /   CC:   No chief complaint on file.   VS Filed Vitals:   02/09/16 1439  BP: 124/74  Pulse: 71  Weight: 150 lb (68.04 kg)  SpO2: 97%    Weights Current Weight  02/09/16 150 lb (68.04 kg)  01/29/16 145 lb (65.772 kg)  01/26/16 145 lb (65.772 kg)    Blood Pressure  BP Readings from Last 3 Encounters:  02/09/16 124/74  01/29/16 143/57  01/26/16 175/84     Admit date:  (Not on file) Last encounter with RMR:  Visit date not found   Allergy Advil and Daypro  Current Outpatient Prescriptions  Medication Sig Dispense Refill  . aspirin EC 81 MG tablet Take 81 mg by mouth daily.    [name] atorvastatin (LIPITOR) 20 MG tablet Take 20 mg by mouth every morning.     . carvedilol (COREG) 25 MG tablet Take 25 mg by mouth 2 (two) times daily with a meal.     . Choline Fenofibrate (TRILIPIX) 135 MG capsule Take 135 mg by mouth every evening.     . diltiazem (CARDIZEM) 30 MG tablet Take 1 tablet (30 mg total) by mouth 2 (two) times daily. 60 tablet 0  . enalapril (VASOTEC) 20 MG tablet Take 10 mg by mouth daily.     [name] esomeprazole (NEXIUM) 40 MG capsule Take 40 mg by mouth daily as needed (for hearburn).     . ferrous sulfate 325 (65 FE) MG tablet Take 325 mg by mouth daily with breakfast.    . glipiZIDE (GLUCOTROL XL) 5 MG 24 hr tablet Take 5 mg by mouth 2 (two) times daily.    . metFORMIN (GLUCOPHAGE) 500 MG tablet Take 500 mg by mouth 2 (two) times daily.     No current facility-administered medications for this visit.    Discontinued Meds:    Medications Discontinued During This Encounter  Medication Reason  . glipiZIDE (GLUCOTROL XL) 2.5 MG 24 hr tablet Error    Patient Active Problem List   Diagnosis Date Noted  . UTI (urinary tract infection) 12/25/2013  . Dyspnea 12/25/2013  . Near  syncope 12/25/2013  . CAD (coronary artery disease) 12/25/2013  . Chronic diastolic heart failure ([place]) 08/07/2013  . Essential hypertension 08/07/2013  . DM2 (diabetes mellitus, type 2) ([place]) 08/07/2013  . Hyperlipidemia 08/07/2013    LABS    Component Value Date/Time   NA 139 01/26/2016 0908   NA 142 11/13/2014 1602   NA 137 12/17/2013 1244   K 4.3 01/26/2016 0908   K 4.2 11/13/2014 1602   K 4.4 12/17/2013 1244   CL 107 01/26/2016 0908   CL 111 11/13/2014 1602   CL 101 12/17/2013 1244   CO2 26 01/26/2016 0908   CO2 25 11/13/2014 1602   CO2 24 12/17/2013 1244   GLUCOSE 163* 01/26/2016 0908   GLUCOSE 163* 11/13/2014 1602   GLUCOSE 164* 12/17/2013 1244   BUN 19 01/26/2016 0908   BUN 26* 11/13/2014 1602   BUN 23 12/17/2013 1244   CREATININE 1.20* 01/26/2016 0908   CREATININE 1.38* 11/13/2014 1602   CREATININE 1.30* 12/17/2013 1244   CALCIUM 9.2 01/26/2016 0908   CALCIUM 9.4 11/13/2014 1602   CALCIUM 9.4  12/17/2013 1244   GFRNONAA 40* 01/26/2016 0908   GFRNONAA 34* 11/13/2014 1602   GFRNONAA 37* 12/17/2013 1244   GFRAA 46* 01/26/2016 0908   GFRAA 39* 11/13/2014 1602   GFRAA 43* 12/17/2013 1244   CMP     Component Value Date/Time   NA 139 01/26/2016 0908   K 4.3 01/26/2016 0908   CL 107 01/26/2016 0908   CO2 26 01/26/2016 0908   GLUCOSE 163* 01/26/2016 0908   BUN 19 01/26/2016 0908   CREATININE 1.20* 01/26/2016 0908   CALCIUM 9.2 01/26/2016 0908   PROT 6.8 02/03/2013 0807   ALBUMIN 3.9 02/03/2013 0807   AST 18 02/03/2013 0807   ALT 15 02/03/2013 0807   ALKPHOS 30* 02/03/2013 0807   BILITOT 0.6 02/03/2013 0807   GFRNONAA 40* 01/26/2016 0908   GFRAA 46* 01/26/2016 0908       Component Value Date/Time   WBC 5.7 01/26/2016 0908   WBC 5.2 11/13/2014 1602   WBC 3.7* 12/16/2013 0755   HGB 11.8* 01/26/2016 0908   HGB 10.1* 11/13/2014 1602   HGB 12.0 12/16/2013 0755   HCT 36.6 01/26/2016 0908   HCT 32.2* 11/13/2014 1602   HCT 36.7 12/16/2013 0755   MCV  92.7 01/26/2016 0908   MCV 92.5 11/13/2014 1602   MCV 91.5 12/16/2013 0755    Lipid Panel  No results found for: CHOL, TRIG, HDL, CHOLHDL, VLDL, LDLCALC, LDLDIRECT  ABG No results found for: PHART, PCO2ART, PO2ART, HCO3, TCO2, ACIDBASEDEF, O2SAT   Lab Results  Component Value Date   TSH 1.664 01/26/2016   BNP (last 3 results) No results for input(s): BNP in the last 8760 hours.  ProBNP (last 3 results) No results for input(s): PROBNP in the last 8760 hours.  Cardiac Panel (last 3 results) No results for input(s): CKTOTAL, CKMB, TROPONINI, RELINDX in the last 72 hours.  Iron/TIBC/Ferritin/ %Sat No results found for: IRON, TIBC, FERRITIN, IRONPCTSAT   EKG Orders placed or performed during the hospital encounter of 01/29/16  . EKG 12-Lead  . EKG 12-Lead  . EKG     Prior Assessment and Plan Problem List as of 02/09/2016      Cardiovascular and Mediastinum   CAD (coronary artery disease)   Last Assessment & Plan 01/24/2014 Office Visit Written 01/24/2014 10:29 AM by Sanda Klein, MD    Asymptomatic, low-risk nuclear study.      Chronic diastolic heart failure [facility])   Last Assessment & Plan 01/24/2014 Office Visit Written 01/24/2014 10:31 AM by Sanda Klein, MD    Asymptomatic, clinically euvolemic      Essential hypertension   Last Assessment & Plan 01/24/2014 Office Visit Written 01/24/2014 10:30 AM by Sanda Klein, MD    Good control      Near syncope   Last Assessment & Plan 01/24/2014 Office Visit Written 01/24/2014 10:30 AM by Sanda Klein, MD    No meaningful arrhythmias seen on event monitor. Suspect her urinary tract infection may have been causal. Now asymptomatic.        Endocrine   DM2 (diabetes mellitus, type 2) [facility])   Last Assessment & Plan 12/25/2013 Office Visit Written 12/25/2013  9:30 AM by Erlene Quan, PA-C    .        Genitourinary   UTI (urinary tract infection)   Last Assessment & Plan 12/25/2013 Office Visit Written 12/25/2013  9:35 AM by Erlene Quan, PA-C    E. Coli UTI noted on hospital lab work- treated  Other   Hyperlipidemia   Last Assessment & Plan 01/24/2014 Office Visit Written 01/24/2014 10:30 AM by Sanda Klein, MD    On combination statin and fenofibrate.      Dyspnea   Last Assessment & Plan 12/25/2013 Office Visit Written 12/25/2013  9:30 AM by Erlene Quan, PA-C    Sudden SOB and near syncope prompting overnight admission          Imaging: Dg Chest 2 View  01/26/2016  CLINICAL DATA:  Shortness of breath and weakness beginning today. EXAM: CHEST - 2 VIEW COMPARISON:  One-view chest x-ray 11/13/2014. FINDINGS: The heart is enlarged. Mild pulmonary vascular congestion is present without frank edema. Bibasilar atelectasis is noted. There are no definite effusions. Atherosclerotic calcifications are present within the thoracic aorta. No focal airspace consolidation is evident. Degenerative changes are present throughout the thoracic spine. IMPRESSION: 1. Cardiomegaly and mild pulmonary vascular congestion may represent early congestive heart failure. 2. Mild bibasilar airspace disease likely reflects atelectasis. 3. No other significant focal airspace consolidation is present. Electronically Signed   By: San Morelle M.D.   On: 01/26/2016 10:03

## 2016-02-09 NOTE — Patient Instructions (Signed)
Your physician recommends that you schedule a follow-up appointment in:  1 month    Your physician has requested that you have an echocardiogram. Echocardiography is a painless test that uses sound waves to create images of your heart. It provides your doctor with information about the size and shape of your heart and how well your heart's chambers and valves are working. This procedure takes approximately one hour. There are no restrictions for this procedure.      Your physician recommends that you continue on your current medications as directed. Please refer to the Current Medication list given to you today.      Thank you for choosing [facility] !         

## 2016-02-09 NOTE — Progress Notes (Signed)
Cardiology Office Note   Date:  02/09/2016   ID:  Vassie, Sluyter 1928/01/24, MRN [id number]  PCP:  Wende Neighbors, MD  Cardiologist: Croitoru/  Jory Sims, NP   Chief Complaint  Patient presents with  . Tachycardia      History of Present Illness: Robin Guzman is a 82 y.o. female who presents for ongoing assessment and management of CAD, HFPEF background, hypertension, diabetes, hyperlipidemia.  The patient recently developed left bundle branch block. She was last seen by Dr. Montel Culver on 02/19/2015 and was stable from a cardiac standpoint.  She was to have a follow up appointment in one year.  The patient was seen in the emergency room on 01/26/2016 with complaints of generalized fatigue.she was seen by Dr. Roderic Palau branch, was found to have transient wide complex rhythm, captured on EKG, consistent with SVT, rate of 133 bpm and aberrancy.  She was started on diltiazem 30 mg twice a day, and continued on carvedilol 25 mg twice a day.  She is to follow up with with cardiology in 2-3 weeks.  Labs revealed a sodium of 139, potassium 4.3, chloride 107, CO2 26, BUN 19, creatinine 1.20. Magnesium 1.8.  TSH 1.664.  She comes state without any further complaints.  She does have some dementia and has trouble hearing, her family, who is with her are very attentive.  She denies any complaints of rapid heart rhythm, weakness, or dizziness, which occurred leading TR, visit.  She is tolerating the medication without complaints.   Past Medical History  Diagnosis Date  . Diabetes mellitus without complication ([place])   . CHF (congestive heart failure) ([place])   . Hypertension   . Hypercholesteremia   . Dyspnea on exertion   . Hx of echocardiogram 04/09/2011    EF 55% Mildly hypertrophic left ventricle with normal systolic function, Moderate (grade II) diastolic dysfunction. Elevated left atrial pressure. No significant valvular abnormalities. No pericardial effusion. Mild to moderate pulmonary  arterial hypertension.  [name] History of stress test 07/05/2006    High risk scan cardiac cathe was recommended.  . Dyslipidemia   . Myocardial infarct [facility])     Past Surgical History  Procedure Laterality Date  . Abdominal hysterectomy       Current Outpatient Prescriptions  Medication Sig Dispense Refill  . aspirin EC 81 MG tablet Take 81 mg by mouth daily.    [name] atorvastatin (LIPITOR) 20 MG tablet Take 20 mg by mouth every morning.     . carvedilol (COREG) 25 MG tablet Take 25 mg by mouth 2 (two) times daily with a meal.     . Choline Fenofibrate (TRILIPIX) 135 MG capsule Take 135 mg by mouth every evening.     . diltiazem (CARDIZEM) 30 MG tablet Take 1 tablet (30 mg total) by mouth 2 (two) times daily. 60 tablet 11  . enalapril (VASOTEC) 20 MG tablet Take 10 mg by mouth daily.     [name] esomeprazole (NEXIUM) 40 MG capsule Take 40 mg by mouth daily as needed (for hearburn).     . ferrous sulfate 325 (65 FE) MG tablet Take 325 mg by mouth daily with breakfast.    . glipiZIDE (GLUCOTROL XL) 5 MG 24 hr tablet Take 5 mg by mouth 2 (two) times daily.    . metFORMIN (GLUCOPHAGE) 500 MG tablet Take 500 mg by mouth 2 (two) times daily.     No current facility-administered medications for this visit.    Allergies:   Advil and Daypro  Social History:  The patient  reports that she has never smoked. She does not have any smokeless tobacco history on file. She reports that she does not drink alcohol or use illicit drugs.   Family History:  The patient's family history includes Cancer in her brother, mother, and sister; Heart Problems in her father.    ROS: All other systems are reviewed and negative. Unless otherwise mentioned in H&P    PHYSICAL EXAM: VS:  BP 124/74 mmHg  Pulse 71  Wt 150 lb (68.04 kg)  SpO2 97% , BMI Body mass index is 29.3 kg/(m^2). GEN: Well nourished, well developed, in no acute distress HEENT: normal Neck: no JVD, carotid bruits, or masses Cardiac: RRR; no  murmurs, rubs, or gallops,no edema  Respiratory:  Clear to auscultation bilaterally, normal work of breathing GI: soft, nontender, nondistended, + BS MS: no deformity or atrophy Skin: warm and dry, no rash Neuro:  Strength and sensation are intact Psych: euthymic mood, full affect   Recent Labs: 01/26/2016: BUN 19; Creatinine, Ser 1.20*; Hemoglobin 11.8*; Magnesium 1.8; Platelets 211; Potassium 4.3; Sodium 139; TSH 1.664    Lipid Panel No results found for: CHOL, TRIG, HDL, CHOLHDL, VLDL, LDLCALC, LDLDIRECT    Wt Readings from Last 3 Encounters:  02/09/16 150 lb (68.04 kg)  01/29/16 145 lb (65.772 kg)  01/26/16 145 lb (65.772 kg)     ASSESSMENT AND PLAN:  1. PSVT:she has been asymptomatic since starting diltiazem 30 mg twice a day.  She is tolerating the medication without dizziness or hypotension.  I am going to repeat her echocardiogram as one has not been done since 2015 for changes in LV systolic function.  She will continue carvedilol 25 mg twice a day along with the diltiazem for rate control.  2. Hypertension:blood pressure is currently well controlled on carvedilol, diltiazem, as well as enalapril.  No changes in her medication regimen at this time.  As she is asymptomatic for any fatigue or dizziness.  3. Diabetes:she is not checking her blood sugar at home.  I have advised her to continue to do so, unless advised not to by primary care.  4. Iron deficiency anemia: She continues oniron supplement.  This will be followed by her primary care physician concerning labs.   Current medicines are reviewed at length with the patient today.    Labs/ tests ordered today include: none  Orders Placed This Encounter  Procedures  . Echocardiogram     Disposition:   FU with one month to discuss echo results, and continue surveillance of her response to diltiazem.  Signed, Jory Sims, NP  02/09/2016 4:06 PM    [facility] [address]  [place], [place] [address] Phone: [phone]; Fax: [phone]

## 2016-02-11 ENCOUNTER — Ambulatory Visit (HOSPITAL_COMMUNITY)
Admission: RE | Admit: 2016-02-11 | Discharge: 2016-02-11 | Disposition: A | Discharge status: Home / Self Care | Payer: Medicare Other | Source: Ambulatory Visit | Attending: Adult Health | Admitting: Adult Health

## 2016-02-11 DIAGNOSIS — I429 Cardiomyopathy, unspecified: Secondary | ICD-10-CM | POA: Diagnosis not present

## 2016-02-11 DIAGNOSIS — I4891 Unspecified atrial fibrillation: Secondary | ICD-10-CM | POA: Insufficient documentation

## 2016-02-11 DIAGNOSIS — E119 Type 2 diabetes mellitus without complications: Secondary | ICD-10-CM | POA: Insufficient documentation

## 2016-02-11 DIAGNOSIS — I313 Pericardial effusion (noninflammatory): Secondary | ICD-10-CM | POA: Diagnosis not present

## 2016-02-11 DIAGNOSIS — I2510 Atherosclerotic heart disease of native coronary artery without angina pectoris: Secondary | ICD-10-CM | POA: Diagnosis not present

## 2016-02-11 DIAGNOSIS — I10 Essential (primary) hypertension: Secondary | ICD-10-CM | POA: Insufficient documentation

## 2016-02-12 ENCOUNTER — Other Ambulatory Visit (HOSPITAL_COMMUNITY): Payer: Medicare Other

## 2016-02-16 DIAGNOSIS — E875 Hyperkalemia: Secondary | ICD-10-CM | POA: Diagnosis not present

## 2016-02-16 DIAGNOSIS — E782 Mixed hyperlipidemia: Secondary | ICD-10-CM | POA: Diagnosis not present

## 2016-02-16 DIAGNOSIS — I10 Essential (primary) hypertension: Secondary | ICD-10-CM | POA: Diagnosis not present

## 2016-02-16 DIAGNOSIS — D509 Iron deficiency anemia, unspecified: Secondary | ICD-10-CM | POA: Diagnosis not present

## 2016-02-17 DIAGNOSIS — N183 Chronic kidney disease, stage 3 (moderate): Secondary | ICD-10-CM | POA: Diagnosis not present

## 2016-03-01 ENCOUNTER — Ambulatory Visit (INDEPENDENT_AMBULATORY_CARE_PROVIDER_SITE_OTHER): Payer: Medicare Other | Admitting: Adult Health

## 2016-03-01 ENCOUNTER — Other Ambulatory Visit (HOSPITAL_COMMUNITY)
Admission: RE | Admit: 2016-03-01 | Discharge: 2016-03-01 | Disposition: A | Discharge status: Home / Self Care | Payer: Medicare Other | Source: Ambulatory Visit | Attending: Adult Health | Admitting: Adult Health

## 2016-03-01 ENCOUNTER — Telehealth: Payer: Self-pay | Admitting: Adult Health

## 2016-03-01 ENCOUNTER — Encounter: Payer: Self-pay | Admitting: Adult Health

## 2016-03-01 VITALS — BP 116/66 | HR 67

## 2016-03-01 DIAGNOSIS — R5383 Other fatigue: Secondary | ICD-10-CM

## 2016-03-01 DIAGNOSIS — R42 Dizziness and giddiness: Secondary | ICD-10-CM | POA: Diagnosis not present

## 2016-03-01 DIAGNOSIS — Z79899 Other long term (current) drug therapy: Secondary | ICD-10-CM

## 2016-03-01 DIAGNOSIS — N390 Urinary tract infection, site not specified: Secondary | ICD-10-CM

## 2016-03-01 DIAGNOSIS — I951 Orthostatic hypotension: Secondary | ICD-10-CM | POA: Diagnosis not present

## 2016-03-01 LAB — CBC WITH DIFFERENTIAL/PLATELET
Basophils Absolute: 0 10*3/uL (ref 0.0–0.1)
Basophils Relative: 0 %
Eosinophils Absolute: 0.1 10*3/uL (ref 0.0–0.7)
Eosinophils Relative: 1 %
HEMATOCRIT: 31.5 % — AB (ref 36.0–46.0)
HEMOGLOBIN: 10.6 g/dL — AB (ref 12.0–15.0)
LYMPHS ABS: 0.7 10*3/uL (ref 0.7–4.0)
Lymphocytes Relative: 15 %
MCH: 31.1 pg (ref 26.0–34.0)
MCHC: 33.7 g/dL (ref 30.0–36.0)
MCV: 92.4 fL (ref 78.0–100.0)
MONO ABS: 0.3 10*3/uL (ref 0.1–1.0)
MONOS PCT: 6 %
NEUTROS ABS: 3.6 10*3/uL (ref 1.7–7.7)
NEUTROS PCT: 78 %
Platelets: 192 10*3/uL (ref 150–400)
RBC: 3.41 MIL/uL — ABNORMAL LOW (ref 3.87–5.11)
RDW: 14.8 % (ref 11.5–15.5)
WBC: 4.6 10*3/uL (ref 4.0–10.5)

## 2016-03-01 LAB — BASIC METABOLIC PANEL
ANION GAP: 7 (ref 5–15)
BUN: 26 mg/dL — ABNORMAL HIGH (ref 6–20)
CHLORIDE: 105 mmol/L (ref 101–111)
CO2: 26 mmol/L (ref 22–32)
Calcium: 9 mg/dL (ref 8.9–10.3)
Creatinine, Ser: 1.53 mg/dL — ABNORMAL HIGH (ref 0.44–1.00)
GFR calc non Af Amer: 30 mL/min — ABNORMAL LOW (ref >60)
GFR, EST AFRICAN AMERICAN: 34 mL/min — AB (ref >60)
GLUCOSE: 113 mg/dL — AB (ref 65–99)
Potassium: 4.4 mmol/L (ref 3.5–5.1)
Sodium: 138 mmol/L (ref 135–145)

## 2016-03-01 MED ORDER — AMLODIPINE BESYLATE 2.5 MG PO TABS: 2.5000 mg | ORAL_TABLET | Freq: Every day | ORAL | Status: DC

## 2016-03-01 NOTE — Telephone Encounter (Signed)
Per daughter , mother fatigued,didn't go to church yesterday she felt so bad.daughter requests sooner f/u than 03/12/16 apt,apt at 2:30 pm today

## 2016-03-01 NOTE — Progress Notes (Signed)
Cardiology Office Note   Date:  03/01/2016   ID:  MACKINZEE ADERHOLT, DOB [birth year], MRN [id number]  PCP:  Wende Neighbors, MD  Cardiologist: Croitoru/  Jory Sims, NP   No chief complaint on file.     History of Present Illness: Robin Guzman is a 82 y.o. female who presents for ongoing assessment and management of CAD, HFPEF SVT  hypertension, diabetes, hyperlipidemia. The patient recently developed left bundle branch block.She was doing well when seen last. Repeat echocardiogram was ordered.   02/11/2016.  Left ventricle: The cavity size was normal. Wall thickness was  increased in a pattern of mild LVH. The estimated ejection  fraction was 55%. Doppler parameters are consistent with abnormal  left ventricular relaxation (grade 1 diastolic dysfunction). - Aortic valve: There was mild regurgitation. - Atrial septum: No defect or patent foramen ovale was identified. - Pericardium, extracardiac: Small pericardial effusion with no  tamponade.  She comes today with complaints of weakness, fatigue, feeling, like she is about to pass out, with associated dizziness.she states this began about a week ago, she states that she is been heavy use her walker and cane to help her because she states that her legs have been feeling weaker.  She denies any loss of consciousness, but notices significant dizziness.  Most recent labs were drawn 01/26/2016.  All of which were within normal limits.  Creatinine, however, was mildly elevated at 1.20.  She was not found to be substantially anemic, but her hemoglobin was 11.8, with hematocrit of 32.2.  She had similar symptoms in the past when she had a UTI.  Past Medical History  Diagnosis Date  . Diabetes mellitus without complication ([place])   . CHF (congestive heart failure) ([place])   . Hypertension   . Hypercholesteremia   . Dyspnea on exertion   . Hx of echocardiogram 04/09/2011    EF 55% Mildly hypertrophic left ventricle with normal systolic  function, Moderate (grade II) diastolic dysfunction. Elevated left atrial pressure. No significant valvular abnormalities. No pericardial effusion. Mild to moderate pulmonary arterial hypertension.  [name] History of stress test 07/05/2006    High risk scan cardiac cathe was recommended.  . Dyslipidemia   . Myocardial infarct [facility])     Past Surgical History  Procedure Laterality Date  . Abdominal hysterectomy       Current Outpatient Prescriptions  Medication Sig Dispense Refill  . amLODipine (NORVASC) 5 MG tablet Take 5 mg by mouth daily.    [name] aspirin EC 81 MG tablet Take 81 mg by mouth daily.    [name] atorvastatin (LIPITOR) 20 MG tablet Take 20 mg by mouth every morning.     . carvedilol (COREG) 25 MG tablet Take 25 mg by mouth 2 (two) times daily with a meal.     . Choline Fenofibrate (TRILIPIX) 135 MG capsule Take 135 mg by mouth every evening.     . diltiazem (CARDIZEM) 30 MG tablet Take 1 tablet (30 mg total) by mouth 2 (two) times daily. 60 tablet 11  . enalapril (VASOTEC) 20 MG tablet Take 10 mg by mouth daily.     [name] esomeprazole (NEXIUM) 40 MG capsule Take 40 mg by mouth daily as needed (for hearburn).     . ferrous sulfate 325 (65 FE) MG tablet Take 325 mg by mouth daily with breakfast.    . glipiZIDE (GLUCOTROL XL) 5 MG 24 hr tablet Take 5 mg by mouth 2 (two) times daily.     No current facility-administered  medications for this visit.    Allergies:   Advil and Daypro    Social History:  The patient  reports that she has never smoked. She does not have any smokeless tobacco history on file. She reports that she does not drink alcohol or use illicit drugs.   Family History:  The patient's family history includes Cancer in her brother, mother, and sister; Heart Problems in her father.    ROS: All other systems are reviewed and negative. Unless otherwise mentioned in H&P    PHYSICAL EXAM: VS:  BP 116/66 mmHg  Pulse 67  SpO2 96% , BMI There is no weight on file to calculate  BMI. GEN: Well nourished, well developed, in no acute distress HEENT: normal Neck: no JVD, carotid bruits, or masses Cardiac: RRR; no murmurs, rubs, or gallops,no edema  Respiratory:  clear to auscultation bilaterally, normal work of breathing GI: soft, nontender, nondistended, + BS MS: no deformity or atrophy Skin: warm and dry, no rash Neuro:  Strength and sensation are intact. Extremely hard of hearing. Psych: euthymic mood, full affect   Recent Labs: 01/26/2016: BUN 19; Creatinine, Ser 1.20*; Hemoglobin 11.8*; Magnesium 1.8; Platelets 211; Potassium 4.3; Sodium 139; TSH 1.664    Lipid Panel No results found for: CHOL, TRIG, HDL, CHOLHDL, VLDL, LDLCALC, LDLDIRECT    Wt Readings from Last 3 Encounters:  02/09/16 150 lb (68.04 kg)  01/29/16 145 lb (65.772 kg)  01/26/16 145 lb (65.772 kg)     ASSESSMENT AND PLAN:  1.  Dizziness with associated weakness:orthostatic blood pressures are completed here in the clinic.blood pressure dropped from 116-108 from lying to standing.  We will also order a UA, and a BMET, along with a CBC to evaluate changes in kidney function, and for evidence of anemia.we will call her with lab results.  2. Hypertension:Will decrease amlodipine to 2.5 mg daily.she is given a blood pressure log to take her blood pressure daily and record.  We will see her again in one week for evaluation of her response to medication changes.  3. History of coronary artery disease:most recent stress test in February 2015 revealed low-risk nuclear study with small mild apical fixed defect.she is asymptomatic concerning chest pain or shortness of breath.   Current medicines are reviewed at length with the patient today.    Labs/ tests ordered today include:  No orders of the defined types were placed in this encounter.     Disposition:   FU with one week. Signed, Jory Sims, NP  03/01/2016 2:39 PM    [facility] [address],  [place], [place] [address] Phone: [phone]; Fax: [phone]

## 2016-03-01 NOTE — Telephone Encounter (Signed)
The patients daughter called stating the patient did not feel well and wanted to be seen today.

## 2016-03-01 NOTE — Progress Notes (Deleted)
Name: Robin Guzman    DOB: 08-24-1928  Age: 82 y.o.  MR#: [id number]       PCP:  Wende Neighbors, MD      Insurance: Payor: MEDICARE / Plan: MEDICARE PART A AND B / Product Type: *No Product type* /   CC:   No chief complaint on file.   VS Filed Vitals:   03/01/16 1431  BP: 116/66  Pulse: 67  SpO2: 96%    Weights Current Weight  02/09/16 150 lb (68.04 kg)  01/29/16 145 lb (65.772 kg)  01/26/16 145 lb (65.772 kg)    Blood Pressure  BP Readings from Last 3 Encounters:  03/01/16 116/66  02/09/16 124/74  01/29/16 143/57     Admit date:  (Not on file) Last encounter with RMR:  03/01/2016   Allergy Advil and Daypro  Current Outpatient Prescriptions  Medication Sig Dispense Refill  . amLODipine (NORVASC) 5 MG tablet Take 5 mg by mouth daily.    [name] aspirin EC 81 MG tablet Take 81 mg by mouth daily.    [name] atorvastatin (LIPITOR) 20 MG tablet Take 20 mg by mouth every morning.     . carvedilol (COREG) 25 MG tablet Take 25 mg by mouth 2 (two) times daily with a meal.     . Choline Fenofibrate (TRILIPIX) 135 MG capsule Take 135 mg by mouth every evening.     . diltiazem (CARDIZEM) 30 MG tablet Take 1 tablet (30 mg total) by mouth 2 (two) times daily. 60 tablet 11  . enalapril (VASOTEC) 20 MG tablet Take 10 mg by mouth daily.     [name] esomeprazole (NEXIUM) 40 MG capsule Take 40 mg by mouth daily as needed (for hearburn).     . ferrous sulfate 325 (65 FE) MG tablet Take 325 mg by mouth daily with breakfast.    . glipiZIDE (GLUCOTROL XL) 5 MG 24 hr tablet Take 5 mg by mouth 2 (two) times daily.     No current facility-administered medications for this visit.    Discontinued Meds:    Medications Discontinued During This Encounter  Medication Reason  . metFORMIN (GLUCOPHAGE) 500 MG tablet Error    Patient Active Problem List   Diagnosis Date Noted  . UTI (urinary tract infection) 12/25/2013  . Dyspnea 12/25/2013  . Near syncope 12/25/2013  . CAD (coronary artery disease) 12/25/2013   . Chronic diastolic heart failure ([place]) 08/07/2013  . Essential hypertension 08/07/2013  . DM2 (diabetes mellitus, type 2) ([place]) 08/07/2013  . Hyperlipidemia 08/07/2013    LABS    Component Value Date/Time   NA 139 01/26/2016 0908   NA 142 11/13/2014 1602   NA 137 12/17/2013 1244   K 4.3 01/26/2016 0908   K 4.2 11/13/2014 1602   K 4.4 12/17/2013 1244   CL 107 01/26/2016 0908   CL 111 11/13/2014 1602   CL 101 12/17/2013 1244   CO2 26 01/26/2016 0908   CO2 25 11/13/2014 1602   CO2 24 12/17/2013 1244   GLUCOSE 163* 01/26/2016 0908   GLUCOSE 163* 11/13/2014 1602   GLUCOSE 164* 12/17/2013 1244   BUN 19 01/26/2016 0908   BUN 26* 11/13/2014 1602   BUN 23 12/17/2013 1244   CREATININE 1.20* 01/26/2016 0908   CREATININE 1.38* 11/13/2014 1602   CREATININE 1.30* 12/17/2013 1244   CALCIUM 9.2 01/26/2016 0908   CALCIUM 9.4 11/13/2014 1602   CALCIUM 9.4 12/17/2013 1244   GFRNONAA 40* 01/26/2016 0908   GFRNONAA 34* 11/13/2014 1602  GFRNONAA 37* 12/17/2013 1244   GFRAA 46* 01/26/2016 0908   GFRAA 39* 11/13/2014 1602   GFRAA 43* 12/17/2013 1244   CMP     Component Value Date/Time   NA 139 01/26/2016 0908   K 4.3 01/26/2016 0908   CL 107 01/26/2016 0908   CO2 26 01/26/2016 0908   GLUCOSE 163* 01/26/2016 0908   BUN 19 01/26/2016 0908   CREATININE 1.20* 01/26/2016 0908   CALCIUM 9.2 01/26/2016 0908   PROT 6.8 02/03/2013 0807   ALBUMIN 3.9 02/03/2013 0807   AST 18 02/03/2013 0807   ALT 15 02/03/2013 0807   ALKPHOS 30* 02/03/2013 0807   BILITOT 0.6 02/03/2013 0807   GFRNONAA 40* 01/26/2016 0908   GFRAA 46* 01/26/2016 0908       Component Value Date/Time   WBC 5.7 01/26/2016 0908   WBC 5.2 11/13/2014 1602   WBC 3.7* 12/16/2013 0755   HGB 11.8* 01/26/2016 0908   HGB 10.1* 11/13/2014 1602   HGB 12.0 12/16/2013 0755   HCT 36.6 01/26/2016 0908   HCT 32.2* 11/13/2014 1602   HCT 36.7 12/16/2013 0755   MCV 92.7 01/26/2016 0908   MCV 92.5 11/13/2014 1602   MCV 91.5  12/16/2013 0755    Lipid Panel  No results found for: CHOL, TRIG, HDL, CHOLHDL, VLDL, LDLCALC, LDLDIRECT  ABG No results found for: PHART, PCO2ART, PO2ART, HCO3, TCO2, ACIDBASEDEF, O2SAT   Lab Results  Component Value Date   TSH 1.664 01/26/2016   BNP (last 3 results) No results for input(s): BNP in the last 8760 hours.  ProBNP (last 3 results) No results for input(s): PROBNP in the last 8760 hours.  Cardiac Panel (last 3 results) No results for input(s): CKTOTAL, CKMB, TROPONINI, RELINDX in the last 72 hours.  Iron/TIBC/Ferritin/ %Sat No results found for: IRON, TIBC, FERRITIN, IRONPCTSAT   EKG Orders placed or performed during the hospital encounter of 01/29/16  . EKG 12-Lead  . EKG 12-Lead  . EKG     Prior Assessment and Plan Problem List as of 03/01/2016      Cardiovascular and Mediastinum   CAD (coronary artery disease)   Last Assessment & Plan 01/24/2014 Office Visit Written 01/24/2014 10:29 AM by Sanda Klein, MD    Asymptomatic, low-risk nuclear study.      Chronic diastolic heart failure [facility])   Last Assessment & Plan 01/24/2014 Office Visit Written 01/24/2014 10:31 AM by Sanda Klein, MD    Asymptomatic, clinically euvolemic      Essential hypertension   Last Assessment & Plan 01/24/2014 Office Visit Written 01/24/2014 10:30 AM by Sanda Klein, MD    Good control      Near syncope   Last Assessment & Plan 01/24/2014 Office Visit Written 01/24/2014 10:30 AM by Sanda Klein, MD    No meaningful arrhythmias seen on event monitor. Suspect her urinary tract infection may have been causal. Now asymptomatic.        Endocrine   DM2 (diabetes mellitus, type 2) [facility])   Last Assessment & Plan 12/25/2013 Office Visit Written 12/25/2013  9:30 AM by Erlene Quan, PA-C    .        Genitourinary   UTI (urinary tract infection)   Last Assessment & Plan 12/25/2013 Office Visit Written 12/25/2013  9:35 AM by Erlene Quan, PA-C    E. Coli UTI noted on hospital lab work-  treated        Other   Hyperlipidemia   Last Assessment & Plan 01/24/2014 Office  Visit Written 01/24/2014 10:30 AM by Sanda Klein, MD    On combination statin and fenofibrate.      Dyspnea   Last Assessment & Plan 12/25/2013 Office Visit Written 12/25/2013  9:30 AM by Erlene Quan, PA-C    Sudden SOB and near syncope prompting overnight admission          Imaging: No results found.

## 2016-03-01 NOTE — Patient Instructions (Signed)
Medication Instructions:  Decrease amlodipine to 2.5 mg daily  Labwork: Your physician recommends that you return for lab work in: Today  bmet Cbc UA   Testing/Procedures: NONE  Follow-Up: Your physician recommends that you schedule a follow-up appointment in: 1 WEEK KL   Any Other Special Instructions Will Be Listed Below (If Applicable).     If you need a refill on your cardiac medications before your next appointment, please call your pharmacy.

## 2016-03-11 ENCOUNTER — Ambulatory Visit: Payer: Medicare Other | Admitting: Adult Health

## 2016-03-12 ENCOUNTER — Encounter: Payer: Self-pay | Admitting: Adult Health

## 2016-03-12 ENCOUNTER — Ambulatory Visit (INDEPENDENT_AMBULATORY_CARE_PROVIDER_SITE_OTHER): Payer: Medicare Other | Admitting: Adult Health

## 2016-03-12 VITALS — BP 124/58 | HR 71 | Ht 60.0 in | Wt 150.0 lb

## 2016-03-12 DIAGNOSIS — I10 Essential (primary) hypertension: Secondary | ICD-10-CM

## 2016-03-12 DIAGNOSIS — I2510 Atherosclerotic heart disease of native coronary artery without angina pectoris: Secondary | ICD-10-CM

## 2016-03-12 NOTE — Progress Notes (Signed)
Cardiology Office Note   Date:  03/12/2016   ID:  Sabrina, Miyazaki Sep 21, 1928, MRN [id number]  PCP:  Wende Neighbors, MD  Cardiologist: Cro[place]/  Jory Sims, NP   No chief complaint on file.     History of Present Illness: Robin Guzman is a 82 y.o. female who presents for ongoing assessment and management of CAD,HFPEF, SVT, hypertension, with history of diabetes and hypertension. She was last seen in the office on 03/01/2016 with complaints of weakness fatigue and like she was about to pass out with associated dizziness. She was found to have some mild orthostasis, we ordered a urinalysis and a BMET along with a CBC to evaluate changes in kidney function and anemia. Amlodipine was decreased to 2.5 mg daily with a blood pressure log provided.  Labs completed on 03/01/2016 revealed a sodium of 138 potassium 4.4 chloride 105 CO2 26 BUN 26 creatinine 1.53. Hemoglobin 10.6 with hematocrit 31.5.urinalysis was not completed.  She is here today feeling much better. She's not having any further dizziness weakness or near syncope. The patient has become more active working in her house and in her garden.  Past Medical History  Diagnosis Date  . Diabetes mellitus without complication ([place])   . CHF (congestive heart failure) ([place])   . Hypertension   . Hypercholesteremia   . Dyspnea on exertion   . Hx of echocardiogram 04/09/2011    EF 55% Mildly hypertrophic left ventricle with normal systolic function, Moderate (grade II) diastolic dysfunction. Elevated left atrial pressure. No significant valvular abnormalities. No pericardial effusion. Mild to moderate pulmonary arterial hypertension.  [name] History of stress test 07/05/2006    High risk scan cardiac cathe was recommended.  . Dyslipidemia   . Myocardial infarct [facility])     Past Surgical History  Procedure Laterality Date  . Abdominal hysterectomy       Current Outpatient Prescriptions  Medication Sig Dispense Refill  . amLODipine  (NORVASC) 2.5 MG tablet Take 1 tablet (2.5 mg total) by mouth daily. 180 tablet 3  . aspirin EC 81 MG tablet Take 81 mg by mouth daily.    [name] atorvastatin (LIPITOR) 20 MG tablet Take 20 mg by mouth every morning.     . carvedilol (COREG) 25 MG tablet Take 25 mg by mouth 2 (two) times daily with a meal.     . Choline Fenofibrate (TRILIPIX) 135 MG capsule Take 135 mg by mouth every evening.     . diltiazem (CARDIZEM) 30 MG tablet Take 1 tablet (30 mg total) by mouth 2 (two) times daily. 60 tablet 11  . enalapril (VASOTEC) 20 MG tablet Take 10 mg by mouth daily.     [name] esomeprazole (NEXIUM) 40 MG capsule Take 40 mg by mouth daily as needed (for hearburn).     . ferrous sulfate 325 (65 FE) MG tablet Take 325 mg by mouth daily with breakfast.    . glipiZIDE (GLUCOTROL XL) 5 MG 24 hr tablet Take 5 mg by mouth 2 (two) times daily.     No current facility-administered medications for this visit.    Allergies:   Advil and Daypro    Social History:  The patient  reports that she has never smoked. She does not have any smokeless tobacco history on file. She reports that she does not drink alcohol or use illicit drugs.   Family History:  The patient's family history includes Cancer in her brother, mother, and sister; Heart Problems in her father.  ROS: All other systems are reviewed and negative. Unless otherwise mentioned in H&P    PHYSICAL EXAM: VS:  There were no vitals taken for this visit. , BMI There is no weight on file to calculate BMI. GEN: Well nourished, well developed, in no acute distress HEENT: normal Neck: no JVD, carotid bruits, or masses Cardiac: RRR; no murmurs, rubs, or gallops,no edema  Respiratory:  clear to auscultation bilaterally, normal work of breathing GI: soft, nontender, nondistended, + BS MS: no deformity or atrophy Skin: warm and dry, no rash Neuro:  Strength and sensation are intact Psych: euthymic mood, full affect   Recent Labs: 01/26/2016: Magnesium 1.8;  TSH 1.664 03/01/2016: BUN 26*; Creatinine, Ser 1.53*; Hemoglobin 10.6*; Platelets 192; Potassium 4.4; Sodium 138    Lipid Panel No results found for: CHOL, TRIG, HDL, CHOLHDL, VLDL, LDLCALC, LDLDIRECT    Wt Readings from Last 3 Encounters:  02/09/16 150 lb (68.04 kg)  01/29/16 145 lb (65.772 kg)  01/26/16 145 lb (65.772 kg)     ASSESSMENT AND PLAN:  1. Hypertension: blood pressure is better now that we have backed off on amlodipine. She is less fatigued less dizzy and has less weakness. I've advised her that if she has any recurrence of this we will discontinue amlodipine altogether. She may do better with a higher blood pressure as indicated by her improvement of symptoms with decreased antihypertensive medication. A CBC is on both diltiazem and amlodipine. Would like to take her off amlodipine altogether and continue her on diltiazem only.  2. [id number] well without complaints of chest pain. She'll continue beta blocker, aspirin, and statin.  3. Chronic diastolic heart failure:no evidence of fluid overload she appears well compensated. She is not on diuretics.   Current medicines are reviewed at length with the patient today.    Labs/ tests ordered today include:  No orders of the defined types were placed in this encounter.     Disposition:   FU with 6 months Signed, Jory Sims, NP  03/12/2016 1:02 PM    [facility] [address] [place], [place] [address] Phone: [phone]; Fax: [phone]

## 2016-03-12 NOTE — Patient Instructions (Signed)
Medication Instructions:  Your physician recommends that you continue on your current medications as directed. Please refer to the Current Medication list given to you today.   Labwork: none  Testing/Procedures: none  Follow-Up: Your physician wants you to follow-up in: 6 months with Jory Sims.  You will receive a reminder letter in the mail two months in advance. If you don't receive a letter, please call our office to schedule the follow-up appointment.   Any Other Special Instructions Will Be Listed Below (If Applicable).     If you need a refill on your cardiac medications before your next appointment, please call your pharmacy.

## 2016-03-12 NOTE — Progress Notes (Signed)
Name: Robin Guzman    DOB: 08/21/1928  Age: 82 y.o.  MR#: [id number]       PCP:  Wende Neighbors, MD      Insurance: Payor: MEDICARE / Plan: MEDICARE PART A AND B / Product Type: *No Product type* /   CC:    Chief Complaint  Patient presents with  . Coronary Artery Disease  . Hypertension    VS Filed Vitals:   03/12/16 1304  BP: 124/58  Pulse: 71  Height: 5' (1.524 m)  Weight: 150 lb (68.04 kg)  SpO2: 95%    Weights Current Weight  03/12/16 150 lb (68.04 kg)  02/09/16 150 lb (68.04 kg)  01/29/16 145 lb (65.772 kg)    Blood Pressure  BP Readings from Last 3 Encounters:  03/12/16 124/58  03/01/16 116/66  02/09/16 124/74     Admit date:  (Not on file) Last encounter with RMR:  03/01/2016   Allergy Advil and Daypro  Current Outpatient Prescriptions  Medication Sig Dispense Refill  . amLODipine (NORVASC) 2.5 MG tablet Take 1 tablet (2.5 mg total) by mouth daily. 180 tablet 3  . aspirin EC 81 MG tablet Take 81 mg by mouth daily.    [name] atorvastatin (LIPITOR) 20 MG tablet Take 20 mg by mouth every morning.     . carvedilol (COREG) 25 MG tablet Take 25 mg by mouth 2 (two) times daily with a meal.     . Choline Fenofibrate (TRILIPIX) 135 MG capsule Take 135 mg by mouth every evening.     . diltiazem (CARDIZEM) 30 MG tablet Take 1 tablet (30 mg total) by mouth 2 (two) times daily. 60 tablet 11  . enalapril (VASOTEC) 20 MG tablet Take 10 mg by mouth daily.     [name] esomeprazole (NEXIUM) 40 MG capsule Take 40 mg by mouth daily as needed (for hearburn).     . ferrous sulfate 325 (65 FE) MG tablet Take 325 mg by mouth daily with breakfast.    . glipiZIDE (GLUCOTROL XL) 5 MG 24 hr tablet Take 5 mg by mouth 2 (two) times daily.     No current facility-administered medications for this visit.    Discontinued Meds:   There are no discontinued medications.  Patient Active Problem List   Diagnosis Date Noted  . UTI (urinary tract infection) 12/25/2013  . Dyspnea 12/25/2013  . Near  syncope 12/25/2013  . CAD (coronary artery disease) 12/25/2013  . Chronic diastolic heart failure ([place]) 08/07/2013  . Essential hypertension 08/07/2013  . DM2 (diabetes mellitus, type 2) ([place]) 08/07/2013  . Hyperlipidemia 08/07/2013    LABS    Component Value Date/Time   NA 138 03/01/2016 1520   NA 139 01/26/2016 0908   NA 142 11/13/2014 1602   K 4.4 03/01/2016 1520   K 4.3 01/26/2016 0908   K 4.2 11/13/2014 1602   CL 105 03/01/2016 1520   CL 107 01/26/2016 0908   CL 111 11/13/2014 1602   CO2 26 03/01/2016 1520   CO2 26 01/26/2016 0908   CO2 25 11/13/2014 1602   GLUCOSE 113* 03/01/2016 1520   GLUCOSE 163* 01/26/2016 0908   GLUCOSE 163* 11/13/2014 1602   BUN 26* 03/01/2016 1520   BUN 19 01/26/2016 0908   BUN 26* 11/13/2014 1602   CREATININE 1.53* 03/01/2016 1520   CREATININE 1.20* 01/26/2016 0908   CREATININE 1.38* 11/13/2014 1602   CALCIUM 9.0 03/01/2016 1520   CALCIUM 9.2 01/26/2016 0908   CALCIUM 9.4 11/13/2014 1602  GFRNONAA 30* 03/01/2016 1520   GFRNONAA 40* 01/26/2016 0908   GFRNONAA 34* 11/13/2014 1602   GFRAA 34* 03/01/2016 1520   GFRAA 46* 01/26/2016 0908   GFRAA 39* 11/13/2014 1602   CMP     Component Value Date/Time   NA 138 03/01/2016 1520   K 4.4 03/01/2016 1520   CL 105 03/01/2016 1520   CO2 26 03/01/2016 1520   GLUCOSE 113* 03/01/2016 1520   BUN 26* 03/01/2016 1520   CREATININE 1.53* 03/01/2016 1520   CALCIUM 9.0 03/01/2016 1520   PROT 6.8 02/03/2013 0807   ALBUMIN 3.9 02/03/2013 0807   AST 18 02/03/2013 0807   ALT 15 02/03/2013 0807   ALKPHOS 30* 02/03/2013 0807   BILITOT 0.6 02/03/2013 0807   GFRNONAA 30* 03/01/2016 1520   GFRAA 34* 03/01/2016 1520       Component Value Date/Time   WBC 4.6 03/01/2016 1520   WBC 5.7 01/26/2016 0908   WBC 5.2 11/13/2014 1602   HGB 10.6* 03/01/2016 1520   HGB 11.8* 01/26/2016 0908   HGB 10.1* 11/13/2014 1602   HCT 31.5* 03/01/2016 1520   HCT 36.6 01/26/2016 0908   HCT 32.2* 11/13/2014 1602   MCV  92.4 03/01/2016 1520   MCV 92.7 01/26/2016 0908   MCV 92.5 11/13/2014 1602    Lipid Panel  No results found for: CHOL, TRIG, HDL, CHOLHDL, VLDL, LDLCALC, LDLDIRECT  ABG No results found for: PHART, PCO2ART, PO2ART, HCO3, TCO2, ACIDBASEDEF, O2SAT   Lab Results  Component Value Date   TSH 1.664 01/26/2016   BNP (last 3 results) No results for input(s): BNP in the last 8760 hours.  ProBNP (last 3 results) No results for input(s): PROBNP in the last 8760 hours.  Cardiac Panel (last 3 results) No results for input(s): CKTOTAL, CKMB, TROPONINI, RELINDX in the last 72 hours.  Iron/TIBC/Ferritin/ %Sat No results found for: IRON, TIBC, FERRITIN, IRONPCTSAT   EKG Orders placed or performed during the hospital encounter of 01/29/16  . EKG 12-Lead  . EKG 12-Lead  . EKG     Prior Assessment and Plan Problem List as of 03/12/2016      Cardiovascular and Mediastinum   CAD (coronary artery disease)   Last Assessment & Plan 01/24/2014 Office Visit Written 01/24/2014 10:29 AM by Sanda Klein, MD    Asymptomatic, low-risk nuclear study.      Chronic diastolic heart failure [facility])   Last Assessment & Plan 01/24/2014 Office Visit Written 01/24/2014 10:31 AM by Sanda Klein, MD    Asymptomatic, clinically euvolemic      Essential hypertension   Last Assessment & Plan 01/24/2014 Office Visit Written 01/24/2014 10:30 AM by Sanda Klein, MD    Good control      Near syncope   Last Assessment & Plan 01/24/2014 Office Visit Written 01/24/2014 10:30 AM by Sanda Klein, MD    No meaningful arrhythmias seen on event monitor. Suspect her urinary tract infection may have been causal. Now asymptomatic.        Endocrine   DM2 (diabetes mellitus, type 2) [facility])   Last Assessment & Plan 12/25/2013 Office Visit Written 12/25/2013  9:30 AM by Erlene Quan, PA-C    .        Genitourinary   UTI (urinary tract infection)   Last Assessment & Plan 12/25/2013 Office Visit Written 12/25/2013  9:35 AM by Erlene Quan, PA-C    E. Coli UTI noted on hospital lab work- treated  Other   Hyperlipidemia   Last Assessment & Plan 01/24/2014 Office Visit Written 01/24/2014 10:30 AM by Sanda Klein, MD    On combination statin and fenofibrate.      Dyspnea   Last Assessment & Plan 12/25/2013 Office Visit Written 12/25/2013  9:30 AM by Erlene Quan, PA-C    Sudden SOB and near syncope prompting overnight admission          Imaging: No results found.

## 2016-04-26 ENCOUNTER — Other Ambulatory Visit: Payer: Self-pay | Admitting: Cardiovascular Disease

## 2016-05-05 DIAGNOSIS — E782 Mixed hyperlipidemia: Secondary | ICD-10-CM | POA: Diagnosis not present

## 2016-05-05 DIAGNOSIS — E1122 Type 2 diabetes mellitus with diabetic chronic kidney disease: Secondary | ICD-10-CM | POA: Diagnosis not present

## 2016-05-05 DIAGNOSIS — D509 Iron deficiency anemia, unspecified: Secondary | ICD-10-CM | POA: Diagnosis not present

## 2016-05-07 DIAGNOSIS — E782 Mixed hyperlipidemia: Secondary | ICD-10-CM | POA: Diagnosis not present

## 2016-05-07 DIAGNOSIS — N183 Chronic kidney disease, stage 3 (moderate): Secondary | ICD-10-CM | POA: Diagnosis not present

## 2016-05-07 DIAGNOSIS — E1122 Type 2 diabetes mellitus with diabetic chronic kidney disease: Secondary | ICD-10-CM | POA: Diagnosis not present

## 2016-05-07 DIAGNOSIS — D509 Iron deficiency anemia, unspecified: Secondary | ICD-10-CM | POA: Diagnosis not present

## 2016-05-07 DIAGNOSIS — I10 Essential (primary) hypertension: Secondary | ICD-10-CM | POA: Diagnosis not present

## 2016-05-11 ENCOUNTER — Encounter (HOSPITAL_COMMUNITY): Payer: Self-pay | Admitting: *Deleted

## 2016-05-11 ENCOUNTER — Emergency Department (HOSPITAL_COMMUNITY)
Admission: EM | Admit: 2016-05-11 | Discharge: 2016-05-12 | Disposition: A | Discharge status: Home / Self Care | Payer: Medicare Other | Attending: Emergency Medicine | Admitting: Emergency Medicine

## 2016-05-11 ENCOUNTER — Other Ambulatory Visit: Payer: Self-pay

## 2016-05-11 DIAGNOSIS — R531 Weakness: Secondary | ICD-10-CM | POA: Insufficient documentation

## 2016-05-11 DIAGNOSIS — I509 Heart failure, unspecified: Secondary | ICD-10-CM | POA: Diagnosis not present

## 2016-05-11 DIAGNOSIS — R404 Transient alteration of awareness: Secondary | ICD-10-CM | POA: Diagnosis not present

## 2016-05-11 DIAGNOSIS — I110 Hypertensive heart disease with heart failure: Secondary | ICD-10-CM | POA: Insufficient documentation

## 2016-05-11 DIAGNOSIS — I252 Old myocardial infarction: Secondary | ICD-10-CM | POA: Insufficient documentation

## 2016-05-11 DIAGNOSIS — E785 Hyperlipidemia, unspecified: Secondary | ICD-10-CM | POA: Insufficient documentation

## 2016-05-11 DIAGNOSIS — E119 Type 2 diabetes mellitus without complications: Secondary | ICD-10-CM | POA: Diagnosis not present

## 2016-05-11 DIAGNOSIS — M6281 Muscle weakness (generalized): Secondary | ICD-10-CM | POA: Diagnosis present

## 2016-05-11 LAB — CBC WITH DIFFERENTIAL/PLATELET
Basophils Absolute: 0 10*3/uL (ref 0.0–0.1)
Basophils Relative: 1 %
Eosinophils Absolute: 0 10*3/uL (ref 0.0–0.7)
Eosinophils Relative: 1 %
HCT: 33.4 % — ABNORMAL LOW (ref 36.0–46.0)
Hemoglobin: 10.8 g/dL — ABNORMAL LOW (ref 12.0–15.0)
LYMPHS ABS: 0.6 10*3/uL — AB (ref 0.7–4.0)
Lymphocytes Relative: 10 %
MCH: 30.1 pg (ref 26.0–34.0)
MCHC: 32.3 g/dL (ref 30.0–36.0)
MCV: 93 fL (ref 78.0–100.0)
MONO ABS: 0.5 10*3/uL (ref 0.1–1.0)
MONOS PCT: 7 %
Neutro Abs: 5.2 10*3/uL (ref 1.7–7.7)
Neutrophils Relative %: 81 %
Platelets: 181 10*3/uL (ref 150–400)
RBC: 3.59 MIL/uL — ABNORMAL LOW (ref 3.87–5.11)
RDW: 14.4 % (ref 11.5–15.5)
WBC: 6.4 10*3/uL (ref 4.0–10.5)

## 2016-05-11 LAB — COMPREHENSIVE METABOLIC PANEL
ALBUMIN: 4 g/dL (ref 3.5–5.0)
ALK PHOS: 29 U/L — AB (ref 38–126)
ALT: 25 U/L (ref 14–54)
ANION GAP: 6 (ref 5–15)
AST: 30 U/L (ref 15–41)
BUN: 30 mg/dL — ABNORMAL HIGH (ref 6–20)
CHLORIDE: 107 mmol/L (ref 101–111)
CO2: 24 mmol/L (ref 22–32)
Calcium: 9 mg/dL (ref 8.9–10.3)
Creatinine, Ser: 1.45 mg/dL — ABNORMAL HIGH (ref 0.44–1.00)
GFR calc Af Amer: 37 mL/min — ABNORMAL LOW (ref >60)
GFR calc non Af Amer: 32 mL/min — ABNORMAL LOW (ref >60)
GLUCOSE: 200 mg/dL — AB (ref 65–99)
POTASSIUM: 3.8 mmol/L (ref 3.5–5.1)
SODIUM: 137 mmol/L (ref 135–145)
Total Bilirubin: 0.6 mg/dL (ref 0.3–1.2)
Total Protein: 6.8 g/dL (ref 6.5–8.1)

## 2016-05-11 LAB — TROPONIN I: Troponin I: <0.03 ng/mL (ref <0.031)

## 2016-05-11 NOTE — ED Notes (Signed)
Ambulated Pt seemed to do okay walking no dizziness

## 2016-05-11 NOTE — ED Notes (Signed)
CGB 272 pt also c/o dizziness and states she had to sit down before she passed out

## 2016-05-11 NOTE — ED Provider Notes (Signed)
CSN: [id number]     Arrival date & time 05/11/16  2000 History  By signing my name below, I, Nicole Kindred, attest that this documentation has been prepared under the direction and in the presence of Ezequiel Essex, MD.   Electronically Signed: Nicole Kindred, ED Scribe. 05/11/2016. 8:13 PM   Chief Complaint  Patient presents with  . Weakness   The history is provided by the patient. No language interpreter was used.   HPI Comments: Robin Guzman is a 82 y.o. female with PMHx of DM, HTN, and hypercholesteremia who presents to the Emergency Department complaining of gradual onset, bilateral leg weakness, onset earlier tonight after cleaning up after dinner. Pt reports associated dizziness and shortness of breath. Pt states she has had similar symptoms in the past but is unsure of the cause. No other associated symptoms noted. Pt reports her symptoms have alleviated since arriving to the ED. No other worsening or alleviating factors noted. Pt denies chest pain, cough, fever, chills, fall, syncope, abdominal pain, back pain, arthralgias, nausea, emesis, dizziness, loss of appetite, decreased fluid consumption, or any other pertinent symptoms. PCP is Dr. Allyn Kenner.   Past Medical History  Diagnosis Date  . Diabetes mellitus without complication ([place])   . CHF (congestive heart failure) ([place])   . Hypertension   . Hypercholesteremia   . Dyspnea on exertion   . Hx of echocardiogram 04/09/2011    EF 55% Mildly hypertrophic left ventricle with normal systolic function, Moderate (grade II) diastolic dysfunction. Elevated left atrial pressure. No significant valvular abnormalities. No pericardial effusion. Mild to moderate pulmonary arterial hypertension.  [name] History of stress test 07/05/2006    High risk scan cardiac cathe was recommended.  . Dyslipidemia   . Myocardial infarct [facility])    Past Surgical History  Procedure Laterality Date  . Abdominal hysterectomy     Family History   Problem Relation Age of Onset  . Cancer Mother   . Heart Problems Father   . Cancer Brother     brain  . Cancer Sister     brain   Social History  Substance Use Topics  . Smoking status: Never Smoker   . Smokeless tobacco: None  . Alcohol Use: No   OB History    Gravida Para Term Preterm AB TAB SAB Ectopic Multiple Living   3 3 3             Review of Systems A complete 10 system review of systems was obtained and all systems are negative except as noted in the HPI and PMH.    Allergies  Advil and Daypro  Home Medications   Prior to Admission medications   Medication Sig Start Date End Date Taking? Authorizing Provider  amLODipine (NORVASC) 2.5 MG tablet Take 1 tablet (2.5 mg total) by mouth daily. 03/01/16   Lendon Colonel, NP  aspirin EC 81 MG tablet Take 81 mg by mouth daily.    Historical Provider, MD  atorvastatin (LIPITOR) 20 MG tablet Take 20 mg by mouth every morning.     Historical Provider, MD  carvedilol (COREG) 25 MG tablet Take 25 mg by mouth 2 (two) times daily with a meal.     Historical Provider, MD  Choline Fenofibrate (TRILIPIX) 135 MG capsule Take 135 mg by mouth every evening.     Historical Provider, MD  diltiazem (CARDIZEM) 30 MG tablet Take 1 tablet (30 mg total) by mouth 2 (two) times daily. 02/09/16   Lendon Colonel, NP  enalapril (VASOTEC) 20 MG tablet Take 10 mg by mouth daily.     Historical Provider, MD  esomeprazole (NEXIUM) 40 MG capsule Take 40 mg by mouth daily as needed (for hearburn).     Historical Provider, MD  ferrous sulfate 325 (65 FE) MG tablet Take 325 mg by mouth daily with breakfast.    Historical Provider, MD  glipiZIDE (GLUCOTROL XL) 5 MG 24 hr tablet Take 5 mg by mouth 2 (two) times daily.    Historical Provider, MD   BP 171/76 mmHg  Pulse 79  Temp(Src) 97.6 F (36.4 C) (Axillary)  Resp 18  Ht 5' (1.524 m)  Wt 150 lb (68.04 kg)  BMI 29.30 kg/m2  SpO2 97% Physical Exam  Constitutional: She is oriented to person,  place, and time. She appears well-developed and well-nourished. No distress.  HENT:  Head: Normocephalic and atraumatic.  Right Ear: Decreased hearing is noted.  Left Ear: Decreased hearing is noted.  Mouth/Throat: Oropharynx is clear and moist. No oropharyngeal exudate.  Hard of hearing.  Eyes: Conjunctivae and EOM are normal. Pupils are equal, round, and reactive to light.  Neck: Normal range of motion. Neck supple.  No meningismus.  Cardiovascular: Normal rate, regular rhythm, normal heart sounds and intact distal pulses.   No murmur heard. Pulmonary/Chest: Effort normal and breath sounds normal. No respiratory distress.  Abdominal: Soft. There is no tenderness. There is no rebound and no guarding.  Musculoskeletal: Normal range of motion. She exhibits no edema or tenderness.  Neurological: She is alert and oriented to person, place, and time. No cranial nerve deficit. She exhibits normal muscle tone. Coordination normal.  No ataxia on finger to nose bilaterally. No pronator drift. 5/5 strength throughout. CN 2-12 intact.Equal grip strength. Sensation intact.   Skin: Skin is warm.  Psychiatric: She has a normal mood and affect. Her behavior is normal.  Nursing note and vitals reviewed.   ED Course  Procedures (including critical care time) DIAGNOSTIC STUDIES: Oxygen Saturation is 97% on RA, normal by my interpretation.    COORDINATION OF CARE: 8:13 PM Discussed treatment plan which includes urinalysis, CMP, CBC with differential/platelet, and troponin I with pt at bedside and pt agreed to plan.  Labs Review Labs Reviewed  CBC WITH DIFFERENTIAL/PLATELET - Abnormal; Notable for the following:    RBC 3.59 (*)    Hemoglobin 10.8 (*)    HCT 33.4 (*)    Lymphs Abs 0.6 (*)    All other components within normal limits  COMPREHENSIVE METABOLIC PANEL - Abnormal; Notable for the following:    Glucose, Bld 200 (*)    BUN 30 (*)    Creatinine, Ser 1.45 (*)    Alkaline Phosphatase 29  (*)    GFR calc non Af Amer 32 (*)    GFR calc Af Amer 37 (*)    All other components within normal limits  URINALYSIS, ROUTINE W REFLEX MICROSCOPIC (NOT AT [facility]) - Abnormal; Notable for the following:    APPearance HAZY (*)    Glucose, UA 250 (*)    Hgb urine dipstick TRACE (*)    Protein, ur TRACE (*)    All other components within normal limits  URINE MICROSCOPIC-ADD ON - Abnormal; Notable for the following:    Squamous Epithelial / LPF 0-5 (*)    Bacteria, UA MANY (*)    All other components within normal limits  TROPONIN I  TROPONIN I    Imaging Review No results found. I have personally reviewed and  evaluated these lab results as part of my medical decision-making.   EKG Interpretation   Date/Time:  Tuesday May 11 2016 20:23:46 EDT Ventricular Rate:  73 PR Interval:    QRS Duration: 92 QT Interval:  385 QTC Calculation: 425 R Axis:   13 Text Interpretation:  Sinus rhythm Low voltage, precordial leads No  significant change was found Confirmed by Wyvonnia Dusky  MD, Lilyian Quayle [phone]) on  05/11/2016 8:35:35 PM      MDM   Final diagnoses:  Generalized weakness   Patient with generalized weakness onset tonight after cleaning up from dinner. Denies any focal numbness or tingling. No focal motor deficits. No chest pain or shortness of breath.  EKG unchanged. Anemia at baseline.  Hyperglycemia without DKA.  She is given PO fluids.  She reports feeling better and denies any weakness or dizziness. No focal deficits on neuro exam. She is tolerating PO and ambulatory. Troponin negative x2.  Doubt CVA or TIA.  Daughter at bedside says she is at baseline any often becomes anxious because when she is alone.  Daughter will stay with her tonight. Return precautions discussed.   I personally performed the services described in this documentation, which was scribed in my presence. The recorded information has been reviewed and is accurate.    Ezequiel Essex, MD 05/12/16 1157

## 2016-05-11 NOTE — ED Notes (Signed)
Pt brought in by [facility] for c/o weakness after cleaning;

## 2016-05-12 LAB — URINE MICROSCOPIC-ADD ON

## 2016-05-12 LAB — URINALYSIS, ROUTINE W REFLEX MICROSCOPIC
BILIRUBIN URINE: NEGATIVE
Glucose, UA: 250 mg/dL — AB
KETONES UR: NEGATIVE mg/dL
Leukocytes, UA: NEGATIVE
NITRITE: NEGATIVE
PH: 5 (ref 5.0–8.0)
Specific Gravity, Urine: 1.02 (ref 1.005–1.030)

## 2016-05-12 LAB — TROPONIN I

## 2016-05-12 NOTE — Discharge Instructions (Signed)
Weakness Keep yourself hydrated. Follow up with Dr. Nevada Crane this week. Return to the ED if you develop new or worsening symptoms. Weakness is a lack of strength. It may be felt all over the body (generalized) or in one specific part of the body (focal). Some causes of weakness can be serious. You may need further medical evaluation, especially if you are elderly or you have a history of immunosuppression (such as chemotherapy or HIV), kidney disease, heart disease, or diabetes. CAUSES  Weakness can be caused by many different things, including:  Infection.  Physical exhaustion.  Internal bleeding or other blood loss that results in a lack of red blood cells (anemia).  Dehydration. This cause is more common in elderly people.  Side effects or electrolyte abnormalities from medicines, such as pain medicines or sedatives.  Emotional distress, anxiety, or depression.  Circulation problems, especially severe peripheral arterial disease.  Heart disease, such as rapid atrial fibrillation, bradycardia, or heart failure.  Nervous system disorders, such as Guillain-Barr syndrome, multiple sclerosis, or stroke. DIAGNOSIS  To find the cause of your weakness, your caregiver will take your history and perform a physical exam. Lab tests or X-rays may also be ordered, if needed. TREATMENT  Treatment of weakness depends on the cause of your symptoms and can vary greatly. HOME CARE INSTRUCTIONS   Rest as needed.  Eat a well-balanced diet.  Try to get some exercise every day.  Only take over-the-counter or prescription medicines as directed by your caregiver. SEEK MEDICAL CARE IF:   Your weakness seems to be getting worse or spreads to other parts of your body.  You develop new aches or pains. SEEK IMMEDIATE MEDICAL CARE IF:   You cannot perform your normal daily activities, such as getting dressed and feeding yourself.  You cannot walk up and down stairs, or you feel exhausted when you do  so.  You have shortness of breath or chest pain.  You have difficulty moving parts of your body.  You have weakness in only one area of the body or on only one side of the body.  You have a fever.  You have trouble speaking or swallowing.  You cannot control your bladder or bowel movements.  You have black or bloody vomit or stools. MAKE SURE YOU:  Understand these instructions.  Will watch your condition.  Will get help right away if you are not doing well or get worse.   This information is not intended to replace advice given to you by your health care provider. Make sure you discuss any questions you have with your health care provider.   Document Released: 11/08/2005 Document Revised: 05/09/2012 Document Reviewed: 01/07/2012 Elsevier Interactive Patient Education [employer].

## 2016-05-26 DIAGNOSIS — R531 Weakness: Secondary | ICD-10-CM | POA: Diagnosis not present

## 2016-06-09 DIAGNOSIS — N390 Urinary tract infection, site not specified: Secondary | ICD-10-CM | POA: Diagnosis not present

## 2016-06-09 DIAGNOSIS — E782 Mixed hyperlipidemia: Secondary | ICD-10-CM | POA: Diagnosis not present

## 2016-07-02 DIAGNOSIS — M170 Bilateral primary osteoarthritis of knee: Secondary | ICD-10-CM | POA: Diagnosis not present

## 2016-07-06 DIAGNOSIS — Z23 Encounter for immunization: Secondary | ICD-10-CM | POA: Diagnosis not present

## 2016-08-27 DIAGNOSIS — M25569 Pain in unspecified knee: Secondary | ICD-10-CM | POA: Diagnosis not present

## 2016-08-27 DIAGNOSIS — Z6827 Body mass index (BMI) 27.0-27.9, adult: Secondary | ICD-10-CM | POA: Diagnosis not present

## 2016-08-27 DIAGNOSIS — Z0000 Encounter for general adult medical examination without abnormal findings: Secondary | ICD-10-CM | POA: Diagnosis not present

## 2016-09-09 ENCOUNTER — Encounter: Payer: Self-pay | Admitting: Adult Health

## 2016-09-09 ENCOUNTER — Ambulatory Visit: Payer: Medicare Other | Admitting: Adult Health

## 2016-09-09 ENCOUNTER — Ambulatory Visit (INDEPENDENT_AMBULATORY_CARE_PROVIDER_SITE_OTHER): Payer: Medicare Other | Admitting: Adult Health

## 2016-09-09 VITALS — BP 146/76 | HR 72 | Ht 60.0 in | Wt 152.0 lb

## 2016-09-09 DIAGNOSIS — I10 Essential (primary) hypertension: Secondary | ICD-10-CM

## 2016-09-09 DIAGNOSIS — I2510 Atherosclerotic heart disease of native coronary artery without angina pectoris: Secondary | ICD-10-CM

## 2016-09-09 DIAGNOSIS — I5032 Chronic diastolic (congestive) heart failure: Secondary | ICD-10-CM

## 2016-09-09 NOTE — Progress Notes (Signed)
Cardiology Office Note   Date:  09/09/2016   ID:  HONORE PENSE, DOB 16-Aug-1928, MRN [id number]  PCP:  Wende Neighbors, MD  Cardiologist: Croitoru/  Jory Sims, NP   Chief Complaint  Patient presents with  . Congestive Heart Failure  . Hypertension      History of Present Illness: Robin Guzman is a 82 y.o. female who presents for ongoing assessment and management of coronary artery disease,HFPEF, SVT, hypertension, with history of diabetes and hypertension. She comes today with continued shortness of breath and issues with memory. She does appear to have some mild dementia. She is being cared for by her daughter who lives nearby. Her only complaint is dyspnea on exertion. She states that she's had no fluid retention dizziness or chest discomfort.  Past Medical History:  Diagnosis Date  . CHF (congestive heart failure) ([place])   . Diabetes mellitus without complication ([place])   . Dyslipidemia   . Dyspnea on exertion   . History of stress test 07/05/2006   High risk scan cardiac cathe was recommended.  [name] Hx of echocardiogram 04/09/2011   EF 55% Mildly hypertrophic left ventricle with normal systolic function, Moderate (grade II) diastolic dysfunction. Elevated left atrial pressure. No significant valvular abnormalities. No pericardial effusion. Mild to moderate pulmonary arterial hypertension.  . Hypercholesteremia   . Hypertension   . Myocardial infarct     Past Surgical History:  Procedure Laterality Date  . ABDOMINAL HYSTERECTOMY       Current Outpatient Prescriptions  Medication Sig Dispense Refill  . aspirin EC 81 MG tablet Take 81 mg by mouth daily.    . carvedilol (COREG) 25 MG tablet Take 25 mg by mouth 2 (two) times daily with a meal.     . diltiazem (CARDIZEM) 30 MG tablet Take 1 tablet (30 mg total) by mouth 2 (two) times daily. 60 tablet 11  . enalapril (VASOTEC) 20 MG tablet Take 10 mg by mouth daily.     [name] esomeprazole (NEXIUM) 40 MG capsule Take 40 mg by  mouth daily as needed (for hearburn).     . ferrous sulfate 325 (65 FE) MG tablet Take 325 mg by mouth daily with breakfast.    . glipiZIDE (GLUCOTROL XL) 5 MG 24 hr tablet Take 5 mg by mouth 2 (two) times daily.     No current facility-administered medications for this visit.     Allergies:   Advil [ibuprofen] and Daypro [oxaprozin]    Social History:  The patient  reports that she has never smoked. She has never used smokeless tobacco. She reports that she does not drink alcohol or use drugs.   Family History:  The patient's family history includes Cancer in her brother, mother, and sister; Heart Problems in her father.    ROS: All other systems are reviewed and negative. Unless otherwise mentioned in H&P    PHYSICAL EXAM: VS:  BP (!) 146/76   Pulse 72   Ht 5' (1.524 m)   Wt 152 lb (68.9 kg)   SpO2 92%   BMI 29.69 kg/m  , BMI Body mass index is 29.69 kg/m. GEN: Well nourished, well developed, in no acute distress  HEENT: normal  Neck: no JVD, carotid bruits, or masses Cardiac:RRR; no murmurs, rubs, or gallops,no edema  Respiratory:  Clear to auscultation bilaterally, normal work of breathing GI: soft, nontender, nondistended, + BS MS: no deformity or atrophy  Skin: warm and dry, no rash Neuro:  Strength and sensation are intact. Hard  of hearing Psych: euthymic mood, full affect    Recent Labs: 01/26/2016: Magnesium 1.8; TSH 1.664 05/11/2016: ALT 25; BUN 30; Creatinine, Ser 1.45; Hemoglobin 10.8; Platelets 181; Potassium 3.8; Sodium 137    Lipid Panel No results found for: CHOL, TRIG, HDL, CHOLHDL, VLDL, LDLCALC, LDLDIRECT    Wt Readings from Last 3 Encounters:  09/09/16 152 lb (68.9 kg)  05/11/16 150 lb (68 kg)  03/12/16 150 lb (68 kg)     ASSESSMENT AND PLAN:  1.  Coronary artery disease: She is asymptomatic, medically stable. Will not pursue any invasive testing at this time, she does become symptomatic.  2. Chronic diastolic CHF: There is no evidence of  decompensation at this time. She will continue carvedilol 25 mg twice a day. She is currently not on any diuretics.  3. Hypertension: On review of medication she is on both diltiazem 30 mg daily and amlodipine 2.5 mg daily I'm going to discontinue amlodipine and increase diltiazem to 30 mg twice a day, she will continue enalapril 20 mg daily. She is due to follow-up with primary care within the next few weeks were labs will be drawn. I did ask her for a copy to be sent to our office.   Current medicines are reviewed at length with the patient today.    Labs/ tests ordered today include:  No orders of the defined types were placed in this encounter.    Disposition:   FU with 6 months. Signed, Jory Sims, NP  09/09/2016 5:21 PM    [facility] [address] [place], [place] [address] Phone: [phone]; Fax: [phone]

## 2016-09-09 NOTE — Patient Instructions (Addendum)
Your physician wants you to follow-up in: 6 Months.  You will receive a reminder letter in the mail two months in advance. If you don't receive a letter, please call our office to schedule the follow-up appointment.  Your physician has recommended you make the following change in your medication:   Stop taking Amlodipine   Take Diltizem 30 mg Two times Daily   If you need a refill on your cardiac medications before your next appointment, please call your pharmacy.  Thank you for choosing [facility]!

## 2016-09-09 NOTE — Progress Notes (Signed)
Name: Robin Guzman    DOB: December 11, 1927  Age: 82 y.o.  MR#: [id number]       PCP:  Wende Neighbors, MD      Insurance: Payor: MEDICARE / Plan: MEDICARE PART A AND B / Product Type: *No Product type* /   CC:   No chief complaint on file.   VS Vitals:   09/09/16 1606  BP: (!) 146/76  Pulse: 72  SpO2: 92%  Weight: 152 lb (68.9 kg)  Height: 5' (1.524 m)    Weights Current Weight  09/09/16 152 lb (68.9 kg)  05/11/16 150 lb (68 kg)  03/12/16 150 lb (68 kg)    Blood Pressure  BP Readings from Last 3 Encounters:  09/09/16 (!) 146/76  05/12/16 135/82  03/12/16 (!) 124/58     Admit date:  (Not on file) Last encounter with RMR:  Visit date not found   Allergy Advil [ibuprofen] and Daypro [oxaprozin]  Current Outpatient Prescriptions  Medication Sig Dispense Refill  . amLODipine (NORVASC) 2.5 MG tablet Take 1 tablet (2.5 mg total) by mouth daily. 180 tablet 3  . aspirin EC 81 MG tablet Take 81 mg by mouth daily.    . carvedilol (COREG) 25 MG tablet Take 25 mg by mouth 2 (two) times daily with a meal.     . diltiazem (CARDIZEM) 30 MG tablet Take 1 tablet (30 mg total) by mouth 2 (two) times daily. 60 tablet 11  . enalapril (VASOTEC) 20 MG tablet Take 10 mg by mouth daily.     [name] esomeprazole (NEXIUM) 40 MG capsule Take 40 mg by mouth daily as needed (for hearburn).     . ferrous sulfate 325 (65 FE) MG tablet Take 325 mg by mouth daily with breakfast.    . glipiZIDE (GLUCOTROL XL) 5 MG 24 hr tablet Take 5 mg by mouth 2 (two) times daily.     No current facility-administered medications for this visit.     Discontinued Meds:    Medications Discontinued During This Encounter  Medication Reason  . atorvastatin (LIPITOR) 20 MG tablet Error  . Choline Fenofibrate (TRILIPIX) 135 MG capsule Error    Patient Active Problem List   Diagnosis Date Noted  . UTI (urinary tract infection) 12/25/2013  . Dyspnea 12/25/2013  . Near syncope 12/25/2013  . CAD (coronary artery disease)  12/25/2013  . Chronic diastolic heart failure ([place]) 08/07/2013  . Essential hypertension 08/07/2013  . DM2 (diabetes mellitus, type 2) ([place]) 08/07/2013  . Hyperlipidemia 08/07/2013    LABS    Component Value Date/Time   NA 137 05/11/2016 2117   NA 138 03/01/2016 1520   NA 139 01/26/2016 0908   K 3.8 05/11/2016 2117   K 4.4 03/01/2016 1520   K 4.3 01/26/2016 0908   CL 107 05/11/2016 2117   CL 105 03/01/2016 1520   CL 107 01/26/2016 0908   CO2 24 05/11/2016 2117   CO2 26 03/01/2016 1520   CO2 26 01/26/2016 0908   GLUCOSE 200 (H) 05/11/2016 2117   GLUCOSE 113 (H) 03/01/2016 1520   GLUCOSE 163 (H) 01/26/2016 0908   BUN 30 (H) 05/11/2016 2117   BUN 26 (H) 03/01/2016 1520   BUN 19 01/26/2016 0908   CREATININE 1.45 (H) 05/11/2016 2117   CREATININE 1.53 (H) 03/01/2016 1520   CREATININE 1.20 (H) 01/26/2016 0908   CALCIUM 9.0 05/11/2016 2117   CALCIUM 9.0 03/01/2016 1520   CALCIUM 9.2 01/26/2016 0908   GFRNONAA 32 (L) 05/11/2016 2117  GFRNONAA 30 (L) 03/01/2016 1520   GFRNONAA 40 (L) 01/26/2016 0908   GFRAA 37 (L) 05/11/2016 2117   GFRAA 34 (L) 03/01/2016 1520   GFRAA 46 (L) 01/26/2016 0908   CMP     Component Value Date/Time   NA 137 05/11/2016 2117   K 3.8 05/11/2016 2117   CL 107 05/11/2016 2117   CO2 24 05/11/2016 2117   GLUCOSE 200 (H) 05/11/2016 2117   BUN 30 (H) 05/11/2016 2117   CREATININE 1.45 (H) 05/11/2016 2117   CALCIUM 9.0 05/11/2016 2117   PROT 6.8 05/11/2016 2117   ALBUMIN 4.0 05/11/2016 2117   AST 30 05/11/2016 2117   ALT 25 05/11/2016 2117   ALKPHOS 29 (L) 05/11/2016 2117   BILITOT 0.6 05/11/2016 2117   GFRNONAA 32 (L) 05/11/2016 2117   GFRAA 37 (L) 05/11/2016 2117       Component Value Date/Time   WBC 6.4 05/11/2016 2117   WBC 4.6 03/01/2016 1520   WBC 5.7 01/26/2016 0908   HGB 10.8 (L) 05/11/2016 2117   HGB 10.6 (L) 03/01/2016 1520   HGB 11.8 (L) 01/26/2016 0908   HCT 33.4 (L) 05/11/2016 2117   HCT 31.5 (L) 03/01/2016 1520   HCT 36.6  01/26/2016 0908   MCV 93.0 05/11/2016 2117   MCV 92.4 03/01/2016 1520   MCV 92.7 01/26/2016 0908    Lipid Panel  No results found for: CHOL, TRIG, HDL, CHOLHDL, VLDL, LDLCALC, LDLDIRECT  ABG No results found for: PHART, PCO2ART, PO2ART, HCO3, TCO2, ACIDBASEDEF, O2SAT   Lab Results  Component Value Date   TSH 1.664 01/26/2016   BNP (last 3 results) No results for input(s): BNP in the last 8760 hours.  ProBNP (last 3 results) No results for input(s): PROBNP in the last 8760 hours.  Cardiac Panel (last 3 results) No results for input(s): CKTOTAL, CKMB, TROPONINI, RELINDX in the last 72 hours.  Iron/TIBC/Ferritin/ %Sat No results found for: IRON, TIBC, FERRITIN, IRONPCTSAT   EKG Orders placed or performed during the hospital encounter of 05/11/16  . ED EKG  . ED EKG  . EKG     Prior Assessment and Plan Problem List as of 09/09/2016 Reviewed: 03/12/2016  1:19 PM by Jory Sims, NP     Cardiovascular and Mediastinum   CAD (coronary artery disease)   Last Assessment & Plan 01/24/2014 Office Visit Written 01/24/2014 10:29 AM by Sanda Klein, MD    Asymptomatic, low-risk nuclear study.      Chronic diastolic heart failure [facility])   Last Assessment & Plan 01/24/2014 Office Visit Written 01/24/2014 10:31 AM by Sanda Klein, MD    Asymptomatic, clinically euvolemic      Essential hypertension   Last Assessment & Plan 01/24/2014 Office Visit Written 01/24/2014 10:30 AM by Sanda Klein, MD    Good control      Near syncope   Last Assessment & Plan 01/24/2014 Office Visit Written 01/24/2014 10:30 AM by Sanda Klein, MD    No meaningful arrhythmias seen on event monitor. Suspect her urinary tract infection may have been causal. Now asymptomatic.        Endocrine   DM2 (diabetes mellitus, type 2) [facility])   Last Assessment & Plan 12/25/2013 Office Visit Written 12/25/2013  9:30 AM by Erlene Quan, PA-C    .        Genitourinary   UTI (urinary tract infection)   Last Assessment &  Plan 12/25/2013 Office Visit Written 12/25/2013  9:35 AM by Erlene Quan, PA-C  E. Coli UTI noted on hospital lab work- treated        Other   Hyperlipidemia   Last Assessment & Plan 01/24/2014 Office Visit Written 01/24/2014 10:30 AM by Sanda Klein, MD    On combination statin and fenofibrate.      Dyspnea   Last Assessment & Plan 12/25/2013 Office Visit Written 12/25/2013  9:30 AM by Erlene Quan, PA-C    Sudden SOB and near syncope prompting overnight admission          Imaging: No results found.

## 2016-09-09 NOTE — Progress Notes (Deleted)
Cardiology Office Note   Date:  09/09/2016   ID:  Robin Guzman, DOB [birth year], MRN [id number]  PCP:  Wende Neighbors, MD  Cardiologist: Croitoru/  Jory Sims, NP   No chief complaint on file.     History of Present Illness: Robin Guzman is a 82 y.o. female who presents for ongoing assessment and management of coronary artery disease, HFPEF, SVT, hypertension, history of diabetes, hypertension. She was last seen in the office on 03/12/2016 and was stable from a cardiac standpoint. There was a consideration to take her off amlodipine as she was both on diltiazem and amlodipine.    Past Medical History:  Diagnosis Date  . CHF (congestive heart failure) ([place])   . Diabetes mellitus without complication ([place])   . Dyslipidemia   . Dyspnea on exertion   . History of stress test 07/05/2006   High risk scan cardiac cathe was recommended.  [name] Hx of echocardiogram 04/09/2011   EF 55% Mildly hypertrophic left ventricle with normal systolic function, Moderate (grade II) diastolic dysfunction. Elevated left atrial pressure. No significant valvular abnormalities. No pericardial effusion. Mild to moderate pulmonary arterial hypertension.  . Hypercholesteremia   . Hypertension   . Myocardial infarct     Past Surgical History:  Procedure Laterality Date  . ABDOMINAL HYSTERECTOMY       Current Outpatient Prescriptions  Medication Sig Dispense Refill  . amLODipine (NORVASC) 2.5 MG tablet Take 1 tablet (2.5 mg total) by mouth daily. 180 tablet 3  . aspirin EC 81 MG tablet Take 81 mg by mouth daily.    [name] atorvastatin (LIPITOR) 20 MG tablet Take 20 mg by mouth every morning.     . carvedilol (COREG) 25 MG tablet Take 25 mg by mouth 2 (two) times daily with a meal.     . Choline Fenofibrate (TRILIPIX) 135 MG capsule Take 135 mg by mouth every evening.     . diltiazem (CARDIZEM) 30 MG tablet Take 1 tablet (30 mg total) by mouth 2 (two) times daily. 60 tablet 11  . enalapril (VASOTEC) 20  MG tablet Take 10 mg by mouth daily.     [name] esomeprazole (NEXIUM) 40 MG capsule Take 40 mg by mouth daily as needed (for hearburn).     . ferrous sulfate 325 (65 FE) MG tablet Take 325 mg by mouth daily with breakfast.    . glipiZIDE (GLUCOTROL XL) 5 MG 24 hr tablet Take 5 mg by mouth 2 (two) times daily.     No current facility-administered medications for this visit.     Allergies:   Advil [ibuprofen] and Daypro [oxaprozin]    Social History:  The patient  reports that she has never smoked. She does not have any smokeless tobacco history on file. She reports that she does not drink alcohol or use drugs.   Family History:  The patient's family history includes Cancer in her brother, mother, and sister; Heart Problems in her father.    ROS: All other systems are reviewed and negative. Unless otherwise mentioned in H&P    PHYSICAL EXAM: VS:  There were no vitals taken for this visit. , BMI There is no height or weight on file to calculate BMI. GEN: Well nourished, well developed, in no acute distress HEENT: normal Neck: no JVD, carotid bruits, or masses Cardiac: ***RRR; no murmurs, rubs, or gallops,no edema  Respiratory:  clear to auscultation bilaterally, normal work of breathing GI: soft, nontender, nondistended, + BS MS: no deformity or atrophy Skin:  warm and dry, no rash Neuro:  Strength and sensation are intact Psych: euthymic mood, full affect   EKG:  EKG {ACTION; IS/IS [id number] ordered today. The ekg ordered today demonstrates ***   Recent Labs: 01/26/2016: Magnesium 1.8; TSH 1.664 05/11/2016: ALT 25; BUN 30; Creatinine, Ser 1.45; Hemoglobin 10.8; Platelets 181; Potassium 3.8; Sodium 137    Lipid Panel No results found for: CHOL, TRIG, HDL, CHOLHDL, VLDL, LDLCALC, LDLDIRECT    Wt Readings from Last 3 Encounters:  05/11/16 150 lb (68 kg)  03/12/16 150 lb (68 kg)  02/09/16 150 lb (68 kg)      Other studies Reviewed: Additional studies/ records that were  reviewed today include: ***. Review of the above records demonstrates: ***   ASSESSMENT AND PLAN:  1.  ***   Current medicines are reviewed at length with the patient today.    Labs/ tests ordered today include: *** No orders of the defined types were placed in this encounter.    Disposition:   FU with *** in {gen number [id number] {TIME; UNITS DAY/WEEK/MONTH:19136}   Signed, Jory Sims, NP  09/09/2016 7:09 AM    [facility] [address] [place], [place] [address] Phone: [phone]; Fax: [phone]

## 2016-09-28 DIAGNOSIS — D518 Other vitamin B12 deficiency anemias: Secondary | ICD-10-CM | POA: Diagnosis not present

## 2016-09-28 DIAGNOSIS — D509 Iron deficiency anemia, unspecified: Secondary | ICD-10-CM | POA: Diagnosis not present

## 2016-09-28 DIAGNOSIS — K700 Alcoholic fatty liver: Secondary | ICD-10-CM | POA: Diagnosis not present

## 2016-09-28 DIAGNOSIS — E119 Type 2 diabetes mellitus without complications: Secondary | ICD-10-CM | POA: Diagnosis not present

## 2016-09-28 DIAGNOSIS — I10 Essential (primary) hypertension: Secondary | ICD-10-CM | POA: Diagnosis not present

## 2016-09-28 DIAGNOSIS — E1122 Type 2 diabetes mellitus with diabetic chronic kidney disease: Secondary | ICD-10-CM | POA: Diagnosis not present

## 2016-09-28 DIAGNOSIS — E782 Mixed hyperlipidemia: Secondary | ICD-10-CM | POA: Diagnosis not present

## 2016-09-28 DIAGNOSIS — I482 Chronic atrial fibrillation: Secondary | ICD-10-CM | POA: Diagnosis not present

## 2016-09-29 DIAGNOSIS — E782 Mixed hyperlipidemia: Secondary | ICD-10-CM | POA: Diagnosis not present

## 2016-09-29 DIAGNOSIS — D509 Iron deficiency anemia, unspecified: Secondary | ICD-10-CM | POA: Diagnosis not present

## 2016-09-29 DIAGNOSIS — Z6828 Body mass index (BMI) 28.0-28.9, adult: Secondary | ICD-10-CM | POA: Diagnosis not present

## 2016-09-29 DIAGNOSIS — E1122 Type 2 diabetes mellitus with diabetic chronic kidney disease: Secondary | ICD-10-CM | POA: Diagnosis not present

## 2016-09-29 DIAGNOSIS — I10 Essential (primary) hypertension: Secondary | ICD-10-CM | POA: Diagnosis not present

## 2016-09-29 DIAGNOSIS — N183 Chronic kidney disease, stage 3 (moderate): Secondary | ICD-10-CM | POA: Diagnosis not present

## 2016-11-18 ENCOUNTER — Emergency Department (HOSPITAL_COMMUNITY)
Admission: EM | Admit: 2016-11-18 | Discharge: 2016-11-18 | Disposition: A | Discharge status: Home / Self Care | Payer: Medicare Other | Attending: Emergency Medicine | Admitting: Emergency Medicine

## 2016-11-18 ENCOUNTER — Telehealth: Payer: Self-pay | Admitting: Orthopedic Surgery

## 2016-11-18 ENCOUNTER — Encounter (INDEPENDENT_AMBULATORY_CARE_PROVIDER_SITE_OTHER): Payer: Self-pay | Admitting: Orthopaedic Surgery

## 2016-11-18 ENCOUNTER — Ambulatory Visit (INDEPENDENT_AMBULATORY_CARE_PROVIDER_SITE_OTHER): Payer: Medicare Other | Admitting: Orthopaedic Surgery

## 2016-11-18 ENCOUNTER — Emergency Department (HOSPITAL_COMMUNITY): Payer: Medicare Other

## 2016-11-18 ENCOUNTER — Encounter (HOSPITAL_COMMUNITY): Payer: Self-pay | Admitting: *Deleted

## 2016-11-18 VITALS — BP 154/83 | HR 65 | Ht 60.0 in | Wt 145.0 lb

## 2016-11-18 DIAGNOSIS — S42292A Other displaced fracture of upper end of left humerus, initial encounter for closed fracture: Secondary | ICD-10-CM

## 2016-11-18 DIAGNOSIS — S42202A Unspecified fracture of upper end of left humerus, initial encounter for closed fracture: Secondary | ICD-10-CM | POA: Insufficient documentation

## 2016-11-18 DIAGNOSIS — Y999 Unspecified external cause status: Secondary | ICD-10-CM | POA: Insufficient documentation

## 2016-11-18 DIAGNOSIS — Y939 Activity, unspecified: Secondary | ICD-10-CM | POA: Diagnosis not present

## 2016-11-18 DIAGNOSIS — I2510 Atherosclerotic heart disease of native coronary artery without angina pectoris: Secondary | ICD-10-CM | POA: Diagnosis not present

## 2016-11-18 DIAGNOSIS — W1839XA Other fall on same level, initial encounter: Secondary | ICD-10-CM | POA: Insufficient documentation

## 2016-11-18 DIAGNOSIS — Y929 Unspecified place or not applicable: Secondary | ICD-10-CM | POA: Diagnosis not present

## 2016-11-18 DIAGNOSIS — S8991XA Unspecified injury of right lower leg, initial encounter: Secondary | ICD-10-CM | POA: Diagnosis not present

## 2016-11-18 DIAGNOSIS — S49092A Other physeal fracture of upper end of humerus, left arm, initial encounter for closed fracture: Secondary | ICD-10-CM | POA: Diagnosis not present

## 2016-11-18 DIAGNOSIS — Z79899 Other long term (current) drug therapy: Secondary | ICD-10-CM | POA: Diagnosis not present

## 2016-11-18 DIAGNOSIS — I252 Old myocardial infarction: Secondary | ICD-10-CM | POA: Insufficient documentation

## 2016-11-18 DIAGNOSIS — Z23 Encounter for immunization: Secondary | ICD-10-CM | POA: Diagnosis not present

## 2016-11-18 DIAGNOSIS — I110 Hypertensive heart disease with heart failure: Secondary | ICD-10-CM | POA: Diagnosis not present

## 2016-11-18 DIAGNOSIS — I5032 Chronic diastolic (congestive) heart failure: Secondary | ICD-10-CM | POA: Insufficient documentation

## 2016-11-18 DIAGNOSIS — S4992XA Unspecified injury of left shoulder and upper arm, initial encounter: Secondary | ICD-10-CM | POA: Diagnosis present

## 2016-11-18 DIAGNOSIS — E119 Type 2 diabetes mellitus without complications: Secondary | ICD-10-CM | POA: Diagnosis not present

## 2016-11-18 DIAGNOSIS — Z7982 Long term (current) use of aspirin: Secondary | ICD-10-CM | POA: Diagnosis not present

## 2016-11-18 DIAGNOSIS — S80811A Abrasion, right lower leg, initial encounter: Secondary | ICD-10-CM | POA: Diagnosis not present

## 2016-11-18 MED ORDER — HYDROCODONE-ACETAMINOPHEN 5-325 MG PO TABS
1.0000 | ORAL_TABLET | Freq: Once | ORAL | Status: AC
  Administered 2016-11-18: 1 via ORAL
  Filled 2016-11-18: qty 1

## 2016-11-18 MED ORDER — HYDROCODONE-ACETAMINOPHEN 5-325 MG PO TABS: ORAL_TABLET | ORAL | 0 refills | Status: DC

## 2016-11-18 MED ORDER — TETANUS-DIPHTH-ACELL PERTUSSIS 5-2.5-18.5 LF-MCG/0.5 IM SUSP
0.5000 mL | Freq: Once | INTRAMUSCULAR | Status: AC
  Administered 2016-11-18: 0.5 mL via INTRAMUSCULAR
  Filled 2016-11-18: qty 0.5

## 2016-11-18 NOTE — Discharge Instructions (Signed)
Keep the shoulder sling in place.  You can clean the abrasion with mild soap and water and keep it bandaged.  Call Dr. Ruthe Mannan office to arrange a follow-up appt.

## 2016-11-18 NOTE — ED Triage Notes (Signed)
Pt states she was cleaning around her furnace and there is a hole in the ground in that area and she tripped into that hole. Denies any dizziness. Pt is having pain to her left shoulder and right shin. Vernard Gambles has bleeding area that is wrapped with bleeding controlled at this time. Denis hitting her head. Takes a baby aspirin daily.

## 2016-11-18 NOTE — Telephone Encounter (Signed)
Patient/daughter inquiring about appointment following [facility] visit today for main problem of fracture of humerus (comminuted).  Relayed that our providers are out until next week, 11/23/16(therefore not on call) and although happy to schedule, it is advisable to have patient seen as soon as possible due to fracture.  Spoke with our nurse, as providers out, and agreed that patient should be seen this week.  Patient's daughter will proceed with calling [facility] providers.

## 2016-11-18 NOTE — Progress Notes (Signed)
Office Visit Note   Patient: Robin Guzman           Date of Birth: 01-23-1928           MRN: [id number] Visit Date: 11/18/2016              Requested by: Celene Squibb, MD [address] [place], [place] [address] PCP: Wende Neighbors, MD   Assessment & Plan: Visit Diagnoses:  1. Closed 3-part fracture of proximal end of left humerus, initial encounter     Plan: Close treatment recommended as long she stays in satisfactory position. She has a history of heart failure at age 77 with the overall satisfactory alignment as long she is not sure if she should heal successfully. We discussed operative versus nonoperative treatment options. We'll recheck her again in 2 weeks with repeat x-rays of her proximal humerus on the left to make sure she is maintaining position. We discussed sleeping in recliner pain medication prescribed ice, beach chair or recliner position Kaiya Boatman 1 by mouth every 8 hours when necessary pain prescribed. Repeat x-rays 2 weeks  Follow-Up Instructions: Return in about 2 weeks (around 12/02/2016).   Orders:  No orders of the defined types were placed in this encounter.  No orders of the defined types were placed in this encounter.     Procedures: No procedures performed   Clinical Data: No additional findings.   Subjective: Chief Complaint  Patient presents with  . Left Shoulder - Fracture    Patient tripped and fell at home today.  She was seen at [facility] with x-rays. Diagnosed with fracture left shoulder.    Review of Systems  Constitutional: Negative for chills and diaphoresis.  HENT: Negative for ear discharge, ear pain and nosebleeds.   Eyes: Negative for discharge and visual disturbance.       Positive for glasses  Respiratory: Negative for cough, choking and shortness of breath.   Cardiovascular: Negative for chest pain and palpitations.       History of congestive heart failure, coronary artery disease.  Gastrointestinal: Negative for  abdominal distention and abdominal pain.  Endocrine: Negative for cold intolerance and heat intolerance.  Genitourinary: Negative for flank pain and hematuria.  Musculoskeletal:       No previous injuries to her left shoulder.  Skin: Negative for rash and wound.  Neurological: Negative for seizures and speech difficulty.  Hematological: Negative for adenopathy. Does not bruise/bleed easily.  Psychiatric/Behavioral: Negative for agitation and suicidal ideas.   endocrine positive for diabetes.   Objective: Vital Signs: BP (!) 154/83   Pulse 65   Ht 5' (1.524 m)   Wt 145 lb (65.8 kg)   BMI 28.32 kg/m   Physical Exam  Constitutional: She is oriented to person, place, and time. She appears well-developed.  HENT:  Head: Normocephalic.  Right Ear: External ear normal.  Left Ear: External ear normal.  Eyes: Pupils are equal, round, and reactive to light.  Neck: No tracheal deviation present. No thyromegaly present.  Cardiovascular: Normal rate and intact distal pulses.   Pulmonary/Chest: Effort normal.  Abdominal: Soft.  Musculoskeletal:  Axillary lateral deltoid sensation is intact. Minimal swelling of the fingers good range of motion of the wrist and fingers. Mild digital swelling.  Neurological: She is alert and oriented to person, place, and time.  Skin: Skin is warm and dry.  Psychiatric: She has a normal mood and affect. Her behavior is normal.    Ortho Exam patient presents  fingertips and distal palmar crease.  Specialty Comments:  No specialty comments available.  Imaging: Dg Tibia/fibula Right  Result Date: 11/18/2016 CLINICAL DATA:  Injury EXAM: RIGHT TIBIA AND FIBULA - 2 VIEW COMPARISON:  None. FINDINGS: Mild degenerative changes with spurring in the right knee. No acute bony abnormality. Specifically, no fracture, subluxation, or dislocation. Soft tissues are intact. IMPRESSION: No acute bony abnormality. Electronically Signed   By: Rolm Baptise M.D.   On:  11/18/2016 12:02   Dg Shoulder Left  Result Date: 11/18/2016 CLINICAL DATA:  Pain following fall EXAM: LEFT SHOULDER - 2+ VIEW COMPARISON:  February 03, 2013 FINDINGS: Frontal and Y scapular images were obtained. There is a comminuted fracture of the proximal humeral metaphysis with impaction at the fracture site. No other fractures. No dislocation. There is mild osteoarthritic change in the acromioclavicular joint. There is atherosclerotic calcification aorta. Visualized left lung is clear. IMPRESSION: Comminuted fracture proximal humeral metaphysis with impaction at fracture site. No dislocation. Osteoarthritic change, primarily in the acromioclavicular joint. There is aortic atherosclerosis. Electronically Signed   By: Lowella Grip III M.D.   On: 11/18/2016 12:03     PMFS History: Patient Active Problem List   Diagnosis Date Noted  . UTI (urinary tract infection) 12/25/2013  . Dyspnea 12/25/2013  . Near syncope 12/25/2013  . CAD (coronary artery disease) 12/25/2013  . Chronic diastolic heart failure ([place]) 08/07/2013  . Essential hypertension 08/07/2013  . DM2 (diabetes mellitus, type 2) ([place]) 08/07/2013  . Hyperlipidemia 08/07/2013   Past Medical History:  Diagnosis Date  . CHF (congestive heart failure) ([place])   . Diabetes mellitus without complication ([place])   . Dyslipidemia   . Dyspnea on exertion   . History of stress test 07/05/2006   High risk scan cardiac cathe was recommended.  [name] Hx of echocardiogram 04/09/2011   EF 55% Mildly hypertrophic left ventricle with normal systolic function, Moderate (grade II) diastolic dysfunction. Elevated left atrial pressure. No significant valvular abnormalities. No pericardial effusion. Mild to moderate pulmonary arterial hypertension.  . Hypercholesteremia   . Hypertension   . Myocardial infarct     Family History  Problem Relation Age of Onset  . Cancer Mother   . Heart Problems Father   . Cancer Brother     brain  . Cancer  Sister     brain    Past Surgical History:  Procedure Laterality Date  . ABDOMINAL HYSTERECTOMY     Social History   Occupational History  . Not on file.   Social History Main Topics  . Smoking status: Never Smoker  . Smokeless tobacco: Never Used  . Alcohol use No  . Drug use: No  . Sexual activity: No

## 2016-11-22 NOTE — ED Provider Notes (Signed)
[facility] DEPT Provider Note   CSN: [id number] Arrival date & time: 11/18/16  1033     History   Chief Complaint Chief Complaint  Patient presents with  . Fall    HPI Robin Guzman is a 83 y.o. female.  HPI  Robin Guzman is a 83 y.o. female who presents to the Emergency Department complaining of left shoulder and right lower leg pain.  Symptoms began several hours prior to arrival after accidentally stepped into a hole in her floor.  She states she scraped her right lower leg when she fell.  She describes pain with weight bearing and with movement of the left arm.  She denies LOC, head injury, dizziness, neck or back pain. Last Td is unknown    Past Medical History:  Diagnosis Date  . CHF (congestive heart failure) ([place])   . Diabetes mellitus without complication ([place])   . Dyslipidemia   . Dyspnea on exertion   . History of stress test 07/05/2006   High risk scan cardiac cathe was recommended.  [name] Hx of echocardiogram 04/09/2011   EF 55% Mildly hypertrophic left ventricle with normal systolic function, Moderate (grade II) diastolic dysfunction. Elevated left atrial pressure. No significant valvular abnormalities. No pericardial effusion. Mild to moderate pulmonary arterial hypertension.  . Hypercholesteremia   . Hypertension   . Myocardial infarct     Patient Active Problem List   Diagnosis Date Noted  . UTI (urinary tract infection) 12/25/2013  . Dyspnea 12/25/2013  . Near syncope 12/25/2013  . CAD (coronary artery disease) 12/25/2013  . Chronic diastolic heart failure ([place]) 08/07/2013  . Essential hypertension 08/07/2013  . DM2 (diabetes mellitus, type 2) ([place]) 08/07/2013  . Hyperlipidemia 08/07/2013    Past Surgical History:  Procedure Laterality Date  . ABDOMINAL HYSTERECTOMY      OB History    Gravida Para Term Preterm AB Living   3 3 3          SAB TAB Ectopic Multiple Live Births                   Home Medications    Prior to  Admission medications   Medication Sig Start Date End Date Taking? Authorizing Provider  aspirin EC 81 MG tablet Take 81 mg by mouth daily.    Historical Provider, MD  carvedilol (COREG) 25 MG tablet Take 25 mg by mouth 2 (two) times daily with a meal.     Historical Provider, MD  diltiazem (CARDIZEM) 30 MG tablet Take 1 tablet (30 mg total) by mouth 2 (two) times daily. 02/09/16   Lendon Colonel, NP  enalapril (VASOTEC) 20 MG tablet Take 10 mg by mouth daily.     Historical Provider, MD  esomeprazole (NEXIUM) 40 MG capsule Take 40 mg by mouth daily as needed (for hearburn).     Historical Provider, MD  ferrous sulfate 325 (65 FE) MG tablet Take 325 mg by mouth daily with breakfast.    Historical Provider, MD  glipiZIDE (GLUCOTROL XL) 5 MG 24 hr tablet Take 5 mg by mouth 2 (two) times daily.    Historical Provider, MD  HYDROcodone-acetaminophen (NORCO/VICODIN) 5-325 MG tablet Take one tab po q 4-6 hrs prn pain 11/18/16   Kem Parkinson, PA-C    Family History Family History  Problem Relation Age of Onset  . Cancer Mother   . Heart Problems Father   . Cancer Brother     brain  . Cancer Sister  brain    Social History Social History  Substance Use Topics  . Smoking status: Never Smoker  . Smokeless tobacco: Never Used  . Alcohol use No     Allergies   Advil [ibuprofen] and Daypro [oxaprozin]   Review of Systems Review of Systems  Constitutional: Negative for chills and fever.  Respiratory: Negative for shortness of breath.   Cardiovascular: Negative for chest pain.  Gastrointestinal: Negative for abdominal pain.  Genitourinary: Negative for difficulty urinating, dysuria and hematuria.  Musculoskeletal: Positive for arthralgias (left arm pain, right lower leg pain). Negative for joint swelling.  Skin: Positive for wound. Negative for color change.       abrasion right lower leg  Neurological: Negative for dizziness, weakness, numbness and headaches.  All other  systems reviewed and are negative.    Physical Exam Updated Vital Signs BP 165/75 (BP Location: Right Arm)   Pulse 61   Temp 97.7 F (36.5 C) (Oral)   Resp 16   Ht 5' (1.524 m)   Wt 68.9 kg   SpO2 97%   BMI 29.69 kg/m   Physical Exam  Constitutional: She is oriented to person, place, and time. She appears well-developed and well-nourished. No distress.  HENT:  Head: Atraumatic.  Mouth/Throat: Oropharynx is clear and moist.  Eyes: Conjunctivae and EOM are normal. Pupils are equal, round, and reactive to light.  Neck: Normal range of motion, full passive range of motion without pain and phonation normal. No spinous process tenderness and no muscular tenderness present.  Cardiovascular: Normal rate and regular rhythm.   Pulmonary/Chest: Effort normal and breath sounds normal. No respiratory distress. She exhibits no tenderness.  Musculoskeletal: She exhibits tenderness. She exhibits no edema or deformity.  ttp of proximal left upper arm.  No bony deformity, no open wound.    Neurological: She is alert and oriented to person, place, and time.  Skin: Skin is warm.  Skin tear of the right lower leg.  Bleeding controlled, no hematoma  Psychiatric: She has a normal mood and affect.  Nursing note and vitals reviewed.    ED Treatments / Results  Labs (all labs ordered are listed, but only abnormal results are displayed) Labs Reviewed - No data to display  EKG  EKG Interpretation None       Radiology Dg Tibia/fibula Right  Result Date: 11/18/2016 CLINICAL DATA:  Injury EXAM: RIGHT TIBIA AND FIBULA - 2 VIEW COMPARISON:  None. FINDINGS: Mild degenerative changes with spurring in the right knee. No acute bony abnormality. Specifically, no fracture, subluxation, or dislocation. Soft tissues are intact. IMPRESSION: No acute bony abnormality. Electronically Signed   By: Rolm Baptise M.D.   On: 11/18/2016 12:02   Dg Shoulder Left  Result Date: 11/18/2016 CLINICAL DATA:  Pain  following fall EXAM: LEFT SHOULDER - 2+ VIEW COMPARISON:  February 03, 2013 FINDINGS: Frontal and Y scapular images were obtained. There is a comminuted fracture of the proximal humeral metaphysis with impaction at the fracture site. No other fractures. No dislocation. There is mild osteoarthritic change in the acromioclavicular joint. There is atherosclerotic calcification aorta. Visualized left lung is clear. IMPRESSION: Comminuted fracture proximal humeral metaphysis with impaction at fracture site. No dislocation. Osteoarthritic change, primarily in the acromioclavicular joint. There is aortic atherosclerosis. Electronically Signed   By: Lowella Grip III M.D.   On: 11/18/2016 12:03     Procedures Procedures (including critical care time)  Medications Ordered in ED Medications  Tdap (BOOSTRIX) injection 0.5 mL (0.5 mLs Intramuscular  Given 11/18/16 1315)  HYDROcodone-acetaminophen (NORCO/VICODIN) 5-325 MG per tablet 1 tablet (1 tablet Oral Given 11/18/16 1338)     Initial Impression / Assessment and Plan / ED Course  I have reviewed the triage vital signs and the nursing notes.  Pertinent labs & imaging results that were available during my care of the patient were reviewed by me and considered in my medical decision making (see chart for details).  Clinical Course     XR results discussed with patient and family member.  Remains NV intact.    Pt also seen by Dr. Lacinda Axon and care plan discussed.    Shoulder immob applied.  Pain improved.  Skin tear was cleaned with nml saline and skin edges approximated, bandaged with xeroform and Td updated.  Patient appears stable for d/c and strict return precautions given.     Final Clinical Impressions(s) / ED Diagnoses   Final diagnoses:  Closed fracture of proximal end of left humerus, unspecified fracture morphology, initial encounter  Abrasion of anterior right lower leg, initial encounter    New Prescriptions Discharge Medication List  as of 11/18/2016  1:35 PM    START taking these medications   Details  HYDROcodone-acetaminophen (NORCO/VICODIN) 5-325 MG tablet Take one tab po q 4-6 hrs prn pain, Print         Kem Parkinson, PA-C 11/22/16 2009    Nat Christen, MD 11/23/16 [phone]

## 2016-11-24 DIAGNOSIS — G3184 Mild cognitive impairment, so stated: Secondary | ICD-10-CM | POA: Diagnosis not present

## 2016-11-24 DIAGNOSIS — R05 Cough: Secondary | ICD-10-CM | POA: Diagnosis not present

## 2016-11-24 DIAGNOSIS — S81801D Unspecified open wound, right lower leg, subsequent encounter: Secondary | ICD-10-CM | POA: Diagnosis not present

## 2016-11-24 DIAGNOSIS — R233 Spontaneous ecchymoses: Secondary | ICD-10-CM | POA: Diagnosis not present

## 2016-11-24 DIAGNOSIS — S42201D Unspecified fracture of upper end of right humerus, subsequent encounter for fracture with routine healing: Secondary | ICD-10-CM | POA: Diagnosis not present

## 2016-12-02 ENCOUNTER — Encounter (INDEPENDENT_AMBULATORY_CARE_PROVIDER_SITE_OTHER): Payer: Self-pay | Admitting: Orthopaedic Surgery

## 2016-12-02 ENCOUNTER — Ambulatory Visit (INDEPENDENT_AMBULATORY_CARE_PROVIDER_SITE_OTHER): Payer: Medicare Other

## 2016-12-02 ENCOUNTER — Ambulatory Visit (INDEPENDENT_AMBULATORY_CARE_PROVIDER_SITE_OTHER): Payer: Medicare Other | Admitting: Orthopaedic Surgery

## 2016-12-02 VITALS — BP 119/66 | HR 68 | Ht 60.0 in | Wt 150.0 lb

## 2016-12-02 DIAGNOSIS — S42292D Other displaced fracture of upper end of left humerus, subsequent encounter for fracture with routine healing: Secondary | ICD-10-CM

## 2016-12-02 NOTE — Progress Notes (Signed)
Office Visit Note   Patient: Robin Guzman           Date of Birth: 10/19/1928           MRN: [id number] Visit Date: 12/02/2016              Requested by: Celene Squibb, MD [address] [place], [place] [address] PCP: Wende Neighbors, MD   Assessment & Plan: Visit Diagnoses:  1. Closed 3-part fracture of proximal humerus, left, with routine healing, subsequent encounter     Plan: Continue conservative treatment. She'll proceed with her sling will recheck her in 2 weeks TO start some physical therapy. By physical exam the fracture proximal distal fragments appear to be getting sticky. She has not had any further displacement on serial x-ray comparing today to previous emergency room films.  Follow-Up Instructions: Return in about 2 weeks (around 12/16/2016).   Orders:  Orders Placed This Encounter  Procedures  . XR Shoulder Left   No orders of the defined types were placed in this encounter.     Procedures: No procedures performed   Clinical Data: No additional findings.   Subjective: Chief Complaint  Patient presents with  . Left Shoulder - Fracture, Follow-up    Patient returns for follow up left proximal humerus fracture. Her initial fracture date was 11/18/2016.    Review of Systems 14 point review of systems updated and unchanged from her visit 2 weeks ago.   Objective: Vital Signs: BP 119/66   Pulse 68   Ht 5' (1.524 m)   Wt 150 lb (68 kg)   BMI 29.29 kg/m   Physical Exam  Constitutional: She is oriented to person, place, and time. She appears well-developed.  HENT:  Head: Normocephalic.  Right Ear: External ear normal.  Left Ear: External ear normal.  Eyes: Pupils are equal, round, and reactive to light.  Neck: No tracheal deviation present. No thyromegaly present.  Cardiovascular: Normal rate.   Pulmonary/Chest: Effort normal.  Abdominal: Soft.  Musculoskeletal:  Patient's arm in a sling she has some mild swelling of the hand of slight  difficulty reaching the last 1 cm distal palmar crease fingertip the palm. Ecchymosis has resolved.  Neurological: She is alert and oriented to person, place, and time.  Skin: Skin is warm and dry.  Psychiatric: She has a normal mood and affect. Her behavior is normal.    Ortho Exam  Specialty Comments:  No specialty comments available.  Imaging: Xr Shoulder Left  Result Date: 12/02/2016 Left proximal humerus AP and lateral x-rays were obtained which shows no change in a 3 part fracture. She still has slight displacement minimal glenohumeral joint subluxation of just a few millimeters. Impression: Three-part proximal humerus fracture in the same position as previous x-rays    PMFS History: Patient Active Problem List   Diagnosis Date Noted  . Closed 3-part fracture of proximal humerus, left, with routine healing, subsequent encounter 12/02/2016  . UTI (urinary tract infection) 12/25/2013  . Dyspnea 12/25/2013  . Near syncope 12/25/2013  . CAD (coronary artery disease) 12/25/2013  . Chronic diastolic heart failure ([place]) 08/07/2013  . Essential hypertension 08/07/2013  . DM2 (diabetes mellitus, type 2) ([place]) 08/07/2013  . Hyperlipidemia 08/07/2013   Past Medical History:  Diagnosis Date  . CHF (congestive heart failure) ([place])   . Diabetes mellitus without complication ([facility])   . Dyslipidemia   . Dyspnea on exertion   . History of stress test 07/05/2006   High risk  scan cardiac cathe was recommended.  [name] Hx of echocardiogram 04/09/2011   EF 55% Mildly hypertrophic left ventricle with normal systolic function, Moderate (grade II) diastolic dysfunction. Elevated left atrial pressure. No significant valvular abnormalities. No pericardial effusion. Mild to moderate pulmonary arterial hypertension.  . Hypercholesteremia   . Hypertension   . Myocardial infarct     Family History  Problem Relation Age of Onset  . Cancer Mother   . Heart Problems Father   . Cancer Brother      brain  . Cancer Sister     brain    Past Surgical History:  Procedure Laterality Date  . ABDOMINAL HYSTERECTOMY     Social History   Occupational History  . Not on file.   Social History Main Topics  . Smoking status: Never Smoker  . Smokeless tobacco: Never Used  . Alcohol use No  . Drug use: No  . Sexual activity: No

## 2016-12-02 NOTE — Progress Notes (Deleted)
   Office Visit Note   Patient: Robin Guzman           Date of Birth: 05-05-1928           MRN: [id number] Visit Date: 12/02/2016              Requested by: Celene Squibb, MD [address] [place], [place] [address] PCP: Wende Neighbors, MD   Assessment & Plan: Visit Diagnoses: No diagnosis found.  Plan: ***  Follow-Up Instructions: No Follow-up on file.   Orders:  No orders of the defined types were placed in this encounter.  No orders of the defined types were placed in this encounter.     Procedures: No procedures performed   Clinical Data: No additional findings.   Subjective: No chief complaint on file.   Closed 3-part fracture of proximal end of left humerus, initial encounter with Dr. Lorin Mercy on 11/18/17.    Review of Systems   Objective: Vital Signs: There were no vitals taken for this visit.  Physical Exam  Ortho Exam  Specialty Comments:  No specialty comments available.  Imaging: No results found.   PMFS History: Patient Active Problem List   Diagnosis Date Noted  . UTI (urinary tract infection) 12/25/2013  . Dyspnea 12/25/2013  . Near syncope 12/25/2013  . CAD (coronary artery disease) 12/25/2013  . Chronic diastolic heart failure ([facility] [facility]) 08/07/2013  . Essential hypertension 08/07/2013  . DM2 (diabetes mellitus, type 2) ([facility]) 08/07/2013  . Hyperlipidemia 08/07/2013   Past Medical History:  Diagnosis Date  . CHF (congestive heart failure) ([facility])   . Diabetes mellitus without complication ([place])   . Dyslipidemia   . Dyspnea on exertion   . History of stress test 07/05/2006   High risk scan cardiac cathe was recommended.  [name] Hx of echocardiogram 04/09/2011   EF 55% Mildly hypertrophic left ventricle with normal systolic function, Moderate (grade II) diastolic dysfunction. Elevated left atrial pressure. No significant valvular abnormalities. No pericardial effusion. Mild to moderate pulmonary arterial hypertension.  . Hypercholesteremia     . Hypertension   . Myocardial infarct     Family History  Problem Relation Age of Onset  . Cancer Mother   . Heart Problems Father   . Cancer Brother     brain  . Cancer Sister     brain    Past Surgical History:  Procedure Laterality Date  . ABDOMINAL HYSTERECTOMY     Social History   Occupational History  . Not on file.   Social History Main Topics  . Smoking status: Never Smoker  . Smokeless tobacco: Never Used  . Alcohol use No  . Drug use: No  . Sexual activity: No

## 2016-12-05 ENCOUNTER — Emergency Department (HOSPITAL_COMMUNITY): Payer: Medicare Other

## 2016-12-05 ENCOUNTER — Encounter (HOSPITAL_COMMUNITY): Payer: Self-pay

## 2016-12-05 ENCOUNTER — Emergency Department (HOSPITAL_COMMUNITY)
Admission: EM | Admit: 2016-12-05 | Discharge: 2016-12-06 | Disposition: A | Discharge status: Home / Self Care | Payer: Medicare Other | Attending: Emergency Medicine | Admitting: Emergency Medicine

## 2016-12-05 DIAGNOSIS — Z7984 Long term (current) use of oral hypoglycemic drugs: Secondary | ICD-10-CM | POA: Insufficient documentation

## 2016-12-05 DIAGNOSIS — I5032 Chronic diastolic (congestive) heart failure: Secondary | ICD-10-CM | POA: Diagnosis not present

## 2016-12-05 DIAGNOSIS — R55 Syncope and collapse: Secondary | ICD-10-CM | POA: Diagnosis not present

## 2016-12-05 DIAGNOSIS — E119 Type 2 diabetes mellitus without complications: Secondary | ICD-10-CM | POA: Insufficient documentation

## 2016-12-05 DIAGNOSIS — I110 Hypertensive heart disease with heart failure: Secondary | ICD-10-CM | POA: Insufficient documentation

## 2016-12-05 DIAGNOSIS — R0682 Tachypnea, not elsewhere classified: Secondary | ICD-10-CM | POA: Diagnosis not present

## 2016-12-05 DIAGNOSIS — I2510 Atherosclerotic heart disease of native coronary artery without angina pectoris: Secondary | ICD-10-CM | POA: Insufficient documentation

## 2016-12-05 DIAGNOSIS — Z7982 Long term (current) use of aspirin: Secondary | ICD-10-CM | POA: Diagnosis not present

## 2016-12-05 DIAGNOSIS — I252 Old myocardial infarction: Secondary | ICD-10-CM | POA: Diagnosis not present

## 2016-12-05 DIAGNOSIS — R0602 Shortness of breath: Secondary | ICD-10-CM | POA: Diagnosis not present

## 2016-12-05 DIAGNOSIS — R531 Weakness: Secondary | ICD-10-CM | POA: Diagnosis not present

## 2016-12-05 LAB — PROTIME-INR
INR: 1.18
Prothrombin Time: 15.1 seconds (ref 11.4–15.2)

## 2016-12-05 LAB — CBC WITH DIFFERENTIAL/PLATELET
BASOS PCT: 0 %
Basophils Absolute: 0 10*3/uL (ref 0.0–0.1)
Eosinophils Absolute: 0.1 10*3/uL (ref 0.0–0.7)
Eosinophils Relative: 1 %
HEMATOCRIT: 31.7 % — AB (ref 36.0–46.0)
Hemoglobin: 10.1 g/dL — ABNORMAL LOW (ref 12.0–15.0)
LYMPHS ABS: 0.7 10*3/uL (ref 0.7–4.0)
Lymphocytes Relative: 8 %
MCH: 29.1 pg (ref 26.0–34.0)
MCHC: 31.9 g/dL (ref 30.0–36.0)
MCV: 91.4 fL (ref 78.0–100.0)
MONO ABS: 0.5 10*3/uL (ref 0.1–1.0)
MONOS PCT: 5 %
NEUTROS ABS: 8.2 10*3/uL — AB (ref 1.7–7.7)
Neutrophils Relative %: 86 %
Platelets: 243 10*3/uL (ref 150–400)
RBC: 3.47 MIL/uL — ABNORMAL LOW (ref 3.87–5.11)
RDW: 14.7 % (ref 11.5–15.5)
WBC: 9.5 10*3/uL (ref 4.0–10.5)

## 2016-12-05 LAB — COMPREHENSIVE METABOLIC PANEL
ALBUMIN: 2.7 g/dL — AB (ref 3.5–5.0)
ALK PHOS: 114 U/L (ref 38–126)
ALT: 17 U/L (ref 14–54)
AST: 21 U/L (ref 15–41)
Anion gap: 8 (ref 5–15)
BILIRUBIN TOTAL: 0.9 mg/dL (ref 0.3–1.2)
BUN: 25 mg/dL — ABNORMAL HIGH (ref 6–20)
CO2: 25 mmol/L (ref 22–32)
Calcium: 8.8 mg/dL — ABNORMAL LOW (ref 8.9–10.3)
Chloride: 106 mmol/L (ref 101–111)
Creatinine, Ser: 1.16 mg/dL — ABNORMAL HIGH (ref 0.44–1.00)
GFR calc Af Amer: 48 mL/min — ABNORMAL LOW (ref >60)
GFR calc non Af Amer: 41 mL/min — ABNORMAL LOW (ref >60)
Glucose, Bld: 118 mg/dL — ABNORMAL HIGH (ref 65–99)
POTASSIUM: 4.3 mmol/L (ref 3.5–5.1)
Sodium: 139 mmol/L (ref 135–145)
TOTAL PROTEIN: 6.2 g/dL — AB (ref 6.5–8.1)

## 2016-12-05 LAB — TROPONIN I: Troponin I: <0.03 ng/mL (ref <0.03)

## 2016-12-05 MED ORDER — ACETAMINOPHEN 500 MG PO TABS
1000.0000 mg | ORAL_TABLET | Freq: Once | ORAL | Status: AC
  Administered 2016-12-05: 1000 mg via ORAL
  Filled 2016-12-05: qty 2

## 2016-12-05 NOTE — Progress Notes (Deleted)
   Office Visit Note   Patient: Robin Guzman           Date of Birth: 1928-01-11           MRN: [id number] Visit Date: 12/02/2016              Requested by: Celene Squibb, MD [address] [place], [place] [address] PCP: Wende Neighbors, MD   Assessment & Plan: Visit Diagnoses:  1. Closed 3-part fracture of proximal humerus, left, with routine healing, subsequent encounter     Plan: ***  Follow-Up Instructions: Return in about 2 weeks (around 12/16/2016).   Orders:  Orders Placed This Encounter  Procedures  . XR Shoulder Left   No orders of the defined types were placed in this encounter.     Procedures: No procedures performed   Clinical Data: No additional findings.   Subjective: Chief Complaint  Patient presents with  . Left Shoulder - Fracture, Follow-up    Closed 3-part fracture of proximal end of left humerus, initial encounter with Dr. Lorin Mercy on 11/18/17.    Review of Systems   Objective: Vital Signs: BP 119/66   Pulse 68   Ht 5' (1.524 m)   Wt 150 lb (68 kg)   BMI 29.29 kg/m   Physical Exam  Ortho Exam  Specialty Comments:  No specialty comments available.  Imaging: No results found.   PMFS History: Patient Active Problem List   Diagnosis Date Noted  . Closed 3-part fracture of proximal humerus, left, with routine healing, subsequent encounter 12/02/2016  . UTI (urinary tract infection) 12/25/2013  . Dyspnea 12/25/2013  . Near syncope 12/25/2013  . CAD (coronary artery disease) 12/25/2013  . Chronic diastolic heart failure ([facility]) 08/07/2013  . Essential hypertension 08/07/2013  . DM2 (diabetes mellitus, type 2) ([place]) 08/07/2013  . Hyperlipidemia 08/07/2013   Past Medical History:  Diagnosis Date  . CHF (congestive heart failure) ([facility])   . Diabetes mellitus without complication ([place])   . Dyslipidemia   . Dyspnea on exertion   . History of stress test 07/05/2006   High risk scan cardiac cathe was recommended.  [name] Hx of  echocardiogram 04/09/2011   EF 55% Mildly hypertrophic left ventricle with normal systolic function, Moderate (grade II) diastolic dysfunction. Elevated left atrial pressure. No significant valvular abnormalities. No pericardial effusion. Mild to moderate pulmonary arterial hypertension.  . Hypercholesteremia   . Hypertension   . Myocardial infarct     Family History  Problem Relation Age of Onset  . Cancer Mother   . Heart Problems Father   . Cancer Brother     brain  . Cancer Sister     brain    Past Surgical History:  Procedure Laterality Date  . ABDOMINAL HYSTERECTOMY     Social History   Occupational History  . Not on file.   Social History Main Topics  . Smoking status: Never Smoker  . Smokeless tobacco: Never Used  . Alcohol use No  . Drug use: No  . Sexual activity: No

## 2016-12-05 NOTE — ED Triage Notes (Signed)
Sob x 1 day per family. Fine crackles in bases per EMS. PT denies chest pain or SOB. NAD.

## 2016-12-05 NOTE — Progress Notes (Deleted)
   Office Visit Note   Patient: Robin Guzman           Date of Birth: 04-16-1928           MRN: [id number] Visit Date: 12/02/2016              Requested by: Celene Squibb, MD [address] [place], [place] [address] PCP: Wende Neighbors, MD   Assessment & Plan: Visit Diagnoses:  1. Closed 3-part fracture of proximal humerus, left, with routine healing, subsequent encounter     Plan: ***  Follow-Up Instructions: Return in about 2 weeks (around 12/16/2016).   Orders:  Orders Placed This Encounter  Procedures  . XR Shoulder Left   No orders of the defined types were placed in this encounter.     Procedures: No procedures performed   Clinical Data: No additional findings.   Subjective: Chief Complaint  Patient presents with  . Left Shoulder - Fracture, Follow-up    Closed 3-part fracture of proximal end of left humerus, initial encounter with Dr. Lorin Mercy on 11/18/17.    Review of Systems   Objective: Vital Signs: BP 119/66   Pulse 68   Ht 5' (1.524 m)   Wt 150 lb (68 kg)   BMI 29.29 kg/m   Physical Exam  Ortho Exam  Specialty Comments:  No specialty comments available.  Imaging: No results found.   PMFS History: Patient Active Problem List   Diagnosis Date Noted  . Closed 3-part fracture of proximal humerus, left, with routine healing, subsequent encounter 12/02/2016  . UTI (urinary tract infection) 12/25/2013  . Dyspnea 12/25/2013  . Near syncope 12/25/2013  . CAD (coronary artery disease) 12/25/2013  . Chronic diastolic heart failure ([facility]) 08/07/2013  . Essential hypertension 08/07/2013  . DM2 (diabetes mellitus, type 2) ([place] [place]) 08/07/2013  . Hyperlipidemia 08/07/2013   Past Medical History:  Diagnosis Date  . CHF (congestive heart failure) ([place])   . Diabetes mellitus without complication ([place] [facility])   . Dyslipidemia   . Dyspnea on exertion   . History of stress test 07/05/2006   High risk scan cardiac cathe was recommended.  [name] Hx of  echocardiogram 04/09/2011   EF 55% Mildly hypertrophic left ventricle with normal systolic function, Moderate (grade II) diastolic dysfunction. Elevated left atrial pressure. No significant valvular abnormalities. No pericardial effusion. Mild to moderate pulmonary arterial hypertension.  . Hypercholesteremia   . Hypertension   . Myocardial infarct     Family History  Problem Relation Age of Onset  . Cancer Mother   . Heart Problems Father   . Cancer Brother     brain  . Cancer Sister     brain    Past Surgical History:  Procedure Laterality Date  . ABDOMINAL HYSTERECTOMY     Social History   Occupational History  . Not on file.   Social History Main Topics  . Smoking status: Never Smoker  . Smokeless tobacco: Never Used  . Alcohol use No  . Drug use: No  . Sexual activity: No

## 2016-12-06 NOTE — ED Provider Notes (Signed)
[facility] DEPT Provider Note   CSN: [id number] Arrival date & time: 12/05/16  2033     History   Chief Complaint Chief Complaint  Patient presents with  . Shortness of Breath    HPI Robin Guzman is a 83 y.o. female.  HPI Patient was sitting on her lift chair which she reports is pretty hard. She moved over to sit on the couch and she was there with her granddaughter who is helping assist her as she is recovering from a recent humerus fracture. Patient reportedly listed over and just slumped to the side. Patient reports that she recalls the entire event. She reports she never lost consciousness and she never had any pain. She states she was sitting up and then kind of quickly she was laying over on her side. Family members do not describe color change or respiratory difficulty. The patient denies that she felt short of breath or having any chest pain. She has been functioning at baseline except for the fact that she had a fairly recent humerus fracture and had multiple bruises. No new injuries however. Past Medical History:  Diagnosis Date  . CHF (congestive heart failure) ([place])   . Diabetes mellitus without complication ([place])   . Dyslipidemia   . Dyspnea on exertion   . History of stress test 07/05/2006   High risk scan cardiac cathe was recommended.  [name] Hx of echocardiogram 04/09/2011   EF 55% Mildly hypertrophic left ventricle with normal systolic function, Moderate (grade II) diastolic dysfunction. Elevated left atrial pressure. No significant valvular abnormalities. No pericardial effusion. Mild to moderate pulmonary arterial hypertension.  . Hypercholesteremia   . Hypertension   . Myocardial infarct     Patient Active Problem List   Diagnosis Date Noted  . Closed 3-part fracture of proximal humerus, left, with routine healing, subsequent encounter 12/02/2016  . UTI (urinary tract infection) 12/25/2013  . Dyspnea 12/25/2013  . Near syncope 12/25/2013  . CAD  (coronary artery disease) 12/25/2013  . Chronic diastolic heart failure ([place]) 08/07/2013  . Essential hypertension 08/07/2013  . DM2 (diabetes mellitus, type 2) ([place]) 08/07/2013  . Hyperlipidemia 08/07/2013    Past Surgical History:  Procedure Laterality Date  . ABDOMINAL HYSTERECTOMY      OB History    Gravida Para Term Preterm AB Living   3 3 3          SAB TAB Ectopic Multiple Live Births                   Home Medications    Prior to Admission medications   Medication Sig Start Date End Date Taking? Authorizing Provider  aspirin EC 81 MG tablet Take 81 mg by mouth daily.   Yes Historical Provider, MD  carvedilol (COREG) 25 MG tablet Take 25 mg by mouth 2 (two) times daily with a meal.    Yes Historical Provider, MD  diltiazem (CARDIZEM) 30 MG tablet Take 1 tablet (30 mg total) by mouth 2 (two) times daily. 02/09/16  Yes Lendon Colonel, NP  enalapril (VASOTEC) 20 MG tablet Take 20 mg by mouth daily.    Yes Historical Provider, MD  glipiZIDE (GLUCOTROL) 5 MG tablet Take 5 mg by mouth daily before breakfast.   Yes Historical Provider, MD  HYDROcodone-acetaminophen (NORCO/VICODIN) 5-325 MG tablet Take one tab po q 4-6 hrs prn pain Patient not taking: Reported on 12/05/2016 11/18/16   Kem Parkinson, PA-C    Family History Family History  Problem Relation Age of  Onset  . Cancer Mother   . Heart Problems Father   . Cancer Brother     brain  . Cancer Sister     brain    Social History Social History  Substance Use Topics  . Smoking status: Never Smoker  . Smokeless tobacco: Never Used  . Alcohol use No     Allergies   Advil [ibuprofen] and Daypro [oxaprozin]   Review of Systems Review of Systems 10 Systems reviewed and are negative for acute change except as noted in the HPI.   Physical Exam Updated Vital Signs BP 125/57   Pulse 72   Temp 97.5 F (36.4 C) (Oral)   Resp 21   Ht 5' (1.524 m)   Wt 150 lb (68 kg)   SpO2 97%   BMI 29.29 kg/m    Physical Exam  Constitutional: She appears well-developed and well-nourished. No distress.  Patient is alert and nontoxic. No respiratory distress. No signs of pain.  HENT:  Head: Normocephalic and atraumatic.  Mouth/Throat: Oropharynx is clear and moist.  Eyes: Conjunctivae and EOM are normal. Pupils are equal, round, and reactive to light.  Neck: Neck supple.  Cardiovascular: Normal rate, regular rhythm, normal heart sounds and intact distal pulses.   No murmur heard. Pulmonary/Chest: Effort normal and breath sounds normal. No respiratory distress. She exhibits no tenderness.  Abdominal: Soft. She exhibits no distension. There is no tenderness. There is no guarding.  Musculoskeletal: She exhibits no edema.  Patient has deep purplish ecchymoses that are resolving over her left humerus area. No erythema. She also has multiple ecchymoses on the lower extremities which again are deep purple and yellow showing resolution. None have erythema. Patient's calves are soft and nontender.  Neurological: She is alert.  Skin: Skin is warm and dry.  Psychiatric: She has a normal mood and affect.  Nursing note and vitals reviewed.    ED Treatments / Results  Labs (all labs ordered are listed, but only abnormal results are displayed) Labs Reviewed  COMPREHENSIVE METABOLIC PANEL - Abnormal; Notable for the following:       Result Value   Glucose, Bld 118 (*)    BUN 25 (*)    Creatinine, Ser 1.16 (*)    Calcium 8.8 (*)    Total Protein 6.2 (*)    Albumin 2.7 (*)    GFR calc non Af Amer 41 (*)    GFR calc Af Amer 48 (*)    All other components within normal limits  CBC WITH DIFFERENTIAL/PLATELET - Abnormal; Notable for the following:    RBC 3.47 (*)    Hemoglobin 10.1 (*)    HCT 31.7 (*)    Neutro Abs 8.2 (*)    All other components within normal limits  TROPONIN I  PROTIME-INR    EKG  EKG Interpretation  Date/Time:  Sunday December 05 2016 21:20:57 EST Ventricular Rate:  72 PR  Interval:    QRS Duration: 88 QT Interval:  389 QTC Calculation: 426 R Axis:   -19 Text Interpretation:  Sinus rhythm Borderline left axis deviation Low voltage, precordial leads no change from previous Confirmed by Johnney Killian, MD, Jeannie Done [phone]) on 12/05/2016 9:27:36 PM       Radiology Dg Chest 2 View  Result Date: 12/05/2016 CLINICAL DATA:  Shortness of breath for 1 day. Crackles in the lung bases. Recent shoulder fracture post fall. EXAM: CHEST  2 VIEW COMPARISON:  Chest radiograph 01/26/2016, 11/13/2012. Shoulder radiograph 11/18/2016 FINDINGS: Stable cardiomegaly with tortuous atherosclerotic  thoracic aorta. No pulmonary edema. Mild patient rotation. No focal airspace disease, pleural fluid or pneumothorax. Left proximal humerus fracture, with slight increased displacement of fracture fragments compared to shoulder radiographs. There is degenerative change in the spine. No evidence of rib fracture. IMPRESSION: 1. No acute abnormality. 2. No stable cardiomegaly with tortuous atherosclerotic thoracic aorta. 3. Left proximal humerus fracture, with suspected mild increased displacement of fracture fragments since shoulder radiographs 11/18/2016. Electronically Signed   By: Jeb Levering M.D.   On: 12/05/2016 22:39    Procedures Procedures (including critical care time)  Medications Ordered in ED Medications  acetaminophen (TYLENOL) tablet 1,000 mg (1,000 mg Oral Given 12/05/16 2317)     Initial Impression / Assessment and Plan / ED Course  I have reviewed the triage vital signs and the nursing notes.  Pertinent labs & imaging results that were available during my care of the patient were reviewed by me and considered in my medical decision making (see chart for details).  Clinical Course     Final Clinical Impressions(s) / ED Diagnoses   Final diagnoses:  Weakness  Near syncope   Patient appears to have had a episode of loss of postural tone. She reports she recalls everything  and never lost consciousness. The family does not describe appearance of color change or respiratory distress. Patient may have had some orthostasis as she did describe transferring from one seated position to another. At this time, diagnostic workup does not show acute anomaly. Plan will be for continued monitoring at home. If any symptoms develop they are to return. Patient is otherwise to see her family doctor for recheck this week. New Prescriptions New Prescriptions   No medications on file     Charlesetta Shanks, MD 12/06/16 [phone]

## 2016-12-16 ENCOUNTER — Encounter (INDEPENDENT_AMBULATORY_CARE_PROVIDER_SITE_OTHER): Payer: Self-pay | Admitting: Orthopaedic Surgery

## 2016-12-16 ENCOUNTER — Ambulatory Visit (INDEPENDENT_AMBULATORY_CARE_PROVIDER_SITE_OTHER): Payer: Medicare Other | Admitting: Orthopaedic Surgery

## 2016-12-16 ENCOUNTER — Ambulatory Visit (INDEPENDENT_AMBULATORY_CARE_PROVIDER_SITE_OTHER): Payer: Medicare Other

## 2016-12-16 VITALS — BP 117/60 | HR 65 | Ht 60.0 in | Wt 147.0 lb

## 2016-12-16 DIAGNOSIS — S42292D Other displaced fracture of upper end of left humerus, subsequent encounter for fracture with routine healing: Secondary | ICD-10-CM

## 2016-12-16 NOTE — Progress Notes (Signed)
Office Visit Note   Patient: Robin Guzman           Date of Birth: 1928-01-04           MRN: [id number] Visit Date: 12/16/2016              Requested by: Celene Squibb, MD [address] [place], [place] [address] PCP: Wende Neighbors, MD   Assessment & Plan: Visit Diagnoses:  1. Closed 3-part fracture of proximal humerus, left, with routine healing, subsequent encounter     Plan: X-rays demonstrate some interval healing. She will remove the sling start working on active assistive range of motion while walking with fingertips we discussed using a pulley.  Follow-Up Instructions: Return in about 4 weeks (around 01/13/2017).   Orders:  Orders Placed This Encounter  Procedures  . XR Shoulder Left   No orders of the defined types were placed in this encounter.     Procedures: No procedures performed   Clinical Data: No additional findings.   Subjective: Chief Complaint  Patient presents with  . Left Shoulder - Fracture, Follow-up    Patient returns for follow up closed 3-part fracture of left proximal humerus. DOI was 11/18/2016. She continues to wear the sling. She states that she is not doing much of anything except sitting around. She did have to go to the ER aobut 2 weeks ago.  She "fell over" on the couch. They are unsure if she was unconscious. Everything checked out okay in the ER per the patient's daughter.     Review of Systems dated unchanged from last office visit 14 point.   Objective: Vital Signs: BP 117/60   Pulse 65   Ht 5' (1.524 m)   Wt 147 lb (66.7 kg)   BMI 28.71 kg/m   Physical Exam axillary sensation is intact swelling or hand is minimal she has good flexion-extension of her fingers. Proximal distal portion of her humerus is moving as one unit she can flex 40 today comfortably.  Ortho Exam  Specialty Comments:  No specialty comments available.  Imaging: Xr Shoulder Left  Result Date: 12/16/2016 X-rays left proximal humerus fracture  reviewed from December 28. This shows interval healing. She has a few millimeters inferior position of the head in relationship to the glenoid. Impression: Healing proximal humerus fracture.    PMFS History: Patient Active Problem List   Diagnosis Date Noted  . Closed 3-part fracture of proximal humerus, left, with routine healing, subsequent encounter 12/02/2016  . UTI (urinary tract infection) 12/25/2013  . Dyspnea 12/25/2013  . Near syncope 12/25/2013  . CAD (coronary artery disease) 12/25/2013  . Chronic diastolic heart failure ([place]) 08/07/2013  . Essential hypertension 08/07/2013  . DM2 (diabetes mellitus, type 2) ([place]) 08/07/2013  . Hyperlipidemia 08/07/2013   Past Medical History:  Diagnosis Date  . CHF (congestive heart failure) ([place])   . Diabetes mellitus without complication ([place])   . Dyslipidemia   . Dyspnea on exertion   . History of stress test 07/05/2006   High risk scan cardiac cathe was recommended.  [name] Hx of echocardiogram 04/09/2011   EF 55% Mildly hypertrophic left ventricle with normal systolic function, Moderate (grade II) diastolic dysfunction. Elevated left atrial pressure. No significant valvular abnormalities. No pericardial effusion. Mild to moderate pulmonary arterial hypertension.  . Hypercholesteremia   . Hypertension   . Myocardial infarct     Family History  Problem Relation Age of Onset  . Cancer Mother   . Heart  Problems Father   . Cancer Brother     brain  . Cancer Sister     brain    Past Surgical History:  Procedure Laterality Date  . ABDOMINAL HYSTERECTOMY     Social History   Occupational History  . Not on file.   Social History Main Topics  . Smoking status: Never Smoker  . Smokeless tobacco: Never Used  . Alcohol use No  . Drug use: No  . Sexual activity: No

## 2017-01-13 ENCOUNTER — Ambulatory Visit (INDEPENDENT_AMBULATORY_CARE_PROVIDER_SITE_OTHER): Payer: Medicare Other | Admitting: Orthopaedic Surgery

## 2017-01-13 ENCOUNTER — Ambulatory Visit (INDEPENDENT_AMBULATORY_CARE_PROVIDER_SITE_OTHER): Payer: Medicare Other

## 2017-01-13 ENCOUNTER — Encounter (INDEPENDENT_AMBULATORY_CARE_PROVIDER_SITE_OTHER): Payer: Self-pay | Admitting: Orthopaedic Surgery

## 2017-01-13 VITALS — BP 138/73 | HR 64

## 2017-01-13 DIAGNOSIS — S42292D Other displaced fracture of upper end of left humerus, subsequent encounter for fracture with routine healing: Secondary | ICD-10-CM | POA: Diagnosis not present

## 2017-01-13 NOTE — Progress Notes (Signed)
Office Visit Note   Patient: Robin Guzman           Date of Birth: 08-Jan-1928           MRN: [id number] Visit Date: 01/13/2017              Requested by: Celene Squibb, MD [address] [place], [place] [address] PCP: Wende Neighbors, MD   Assessment & Plan: Visit Diagnoses:  1. Closed 3-part fracture of proximal humerus, left, with routine healing, subsequent encounter     Plan: Healed proximal humerus fracture. She can reach the top of her head. Some limitation in internal rotation only the posterior axillary line. She is out of her sling she still working on stretching exercises. She can gradually resume normal activities and continue stretching exercises to improve her range of motion.  Follow-Up Instructions: Return if symptoms worsen or fail to improve.   Orders:  Orders Placed This Encounter  Procedures  . XR Shoulder Left   No orders of the defined types were placed in this encounter.     Procedures: No procedures performed   Clinical Data: No additional findings.   Subjective: Chief Complaint  Patient presents with  . Left Shoulder - Follow-up, Fracture    Patient returns for follow up. She is status post 3 part closed fracture left proximal humerus on 11/18/2016. She has removed the sling. She states that she feels better. She has increased range of motion. She states that she is still sore, but is not taking anything for it.     Review of Systems 14 point review of systems updated. Of note is positive history of heart failure type 2 diabetes history of dyspnea, coronary artery disease. Left proximal humerus fracture.   Objective: Vital Signs: BP 138/73   Pulse 64   Physical Exam  Constitutional: She is oriented to person, place, and time. She appears well-developed.  HENT:  Head: Normocephalic.  Right Ear: External ear normal.  Left Ear: External ear normal.  Eyes: Pupils are equal, round, and reactive to light.  Glasses for vision  Neck: No  tracheal deviation present. No thyromegaly present.  Cardiovascular: Normal rate.   Pulmonary/Chest: Effort normal.  Abdominal: Soft.  Musculoskeletal:  Proximal distal left humerus moved as one unit. She can reach her nose rates top of her head internal rotation with hand the posterior axillary line. She can reach across her opposite shoulder with minimal discomfort. Deltoid shows slight atrophy sensation over the axillary nerve is intact. No swelling of the hands or fingers. Elbow range of motion is full  Neurological: She is alert and oriented to person, place, and time.  Skin: Skin is warm and dry.  Psychiatric: She has a normal mood and affect. Her behavior is normal.    Ortho Exam  Specialty Comments:  No specialty comments available.  Imaging: Xr Shoulder Left  Result Date: 01/13/2017 Two-view x-rays left shoulder obtained. This shows healing proximal humerus fracture with adequate callus formation. She is had slight subsidence at the fracture site. On both use the head remains in satisfactory position with the shaft and she has adequate callus formation. Impression: Healed left proximal humerus fracture    PMFS History: Patient Active Problem List   Diagnosis Date Noted  . Closed 3-part fracture of proximal humerus, left, with routine healing, subsequent encounter 12/02/2016  . UTI (urinary tract infection) 12/25/2013  . Dyspnea 12/25/2013  . Near syncope 12/25/2013  . CAD (coronary artery disease) 12/25/2013  .  Chronic diastolic heart failure ([place]) 08/07/2013  . Essential hypertension 08/07/2013  . DM2 (diabetes mellitus, type 2) ([place]) 08/07/2013  . Hyperlipidemia 08/07/2013   Past Medical History:  Diagnosis Date  . CHF (congestive heart failure) ([place])   . Diabetes mellitus without complication ([place])   . Dyslipidemia   . Dyspnea on exertion   . History of stress test 07/05/2006   High risk scan cardiac cathe was recommended.  [name] Hx of echocardiogram 04/09/2011    EF 55% Mildly hypertrophic left ventricle with normal systolic function, Moderate (grade II) diastolic dysfunction. Elevated left atrial pressure. No significant valvular abnormalities. No pericardial effusion. Mild to moderate pulmonary arterial hypertension.  . Hypercholesteremia   . Hypertension   . Myocardial infarct     Family History  Problem Relation Age of Onset  . Cancer Mother   . Heart Problems Father   . Cancer Brother     brain  . Cancer Sister     brain    Past Surgical History:  Procedure Laterality Date  . ABDOMINAL HYSTERECTOMY     Social History   Occupational History  . Not on file.   Social History Main Topics  . Smoking status: Never Smoker  . Smokeless tobacco: Never Used  . Alcohol use No  . Drug use: No  . Sexual activity: No

## 2017-01-24 DIAGNOSIS — E782 Mixed hyperlipidemia: Secondary | ICD-10-CM | POA: Diagnosis not present

## 2017-01-24 DIAGNOSIS — E1122 Type 2 diabetes mellitus with diabetic chronic kidney disease: Secondary | ICD-10-CM | POA: Diagnosis not present

## 2017-01-24 DIAGNOSIS — D509 Iron deficiency anemia, unspecified: Secondary | ICD-10-CM | POA: Diagnosis not present

## 2017-01-26 DIAGNOSIS — N183 Chronic kidney disease, stage 3 (moderate): Secondary | ICD-10-CM | POA: Diagnosis not present

## 2017-01-26 DIAGNOSIS — D509 Iron deficiency anemia, unspecified: Secondary | ICD-10-CM | POA: Diagnosis not present

## 2017-01-26 DIAGNOSIS — E782 Mixed hyperlipidemia: Secondary | ICD-10-CM | POA: Diagnosis not present

## 2017-01-26 DIAGNOSIS — E1122 Type 2 diabetes mellitus with diabetic chronic kidney disease: Secondary | ICD-10-CM | POA: Diagnosis not present

## 2017-01-26 DIAGNOSIS — I10 Essential (primary) hypertension: Secondary | ICD-10-CM | POA: Diagnosis not present

## 2017-01-26 DIAGNOSIS — Z6827 Body mass index (BMI) 27.0-27.9, adult: Secondary | ICD-10-CM | POA: Diagnosis not present

## 2017-02-22 DIAGNOSIS — Z7984 Long term (current) use of oral hypoglycemic drugs: Secondary | ICD-10-CM | POA: Diagnosis not present

## 2017-02-22 DIAGNOSIS — H353131 Nonexudative age-related macular degeneration, bilateral, early dry stage: Secondary | ICD-10-CM | POA: Diagnosis not present

## 2017-02-22 DIAGNOSIS — E119 Type 2 diabetes mellitus without complications: Secondary | ICD-10-CM | POA: Diagnosis not present

## 2017-02-22 DIAGNOSIS — H2513 Age-related nuclear cataract, bilateral: Secondary | ICD-10-CM | POA: Diagnosis not present

## 2017-03-08 ENCOUNTER — Encounter (HOSPITAL_COMMUNITY): Payer: Self-pay | Admitting: Emergency Medicine

## 2017-03-08 ENCOUNTER — Emergency Department (HOSPITAL_COMMUNITY): Payer: Medicare Other

## 2017-03-08 ENCOUNTER — Emergency Department (HOSPITAL_COMMUNITY)
Admission: EM | Admit: 2017-03-08 | Discharge: 2017-03-08 | Disposition: A | Discharge status: Home / Self Care | Payer: Medicare Other | Attending: Emergency Medicine | Admitting: Emergency Medicine

## 2017-03-08 DIAGNOSIS — I5032 Chronic diastolic (congestive) heart failure: Secondary | ICD-10-CM | POA: Diagnosis not present

## 2017-03-08 DIAGNOSIS — Z7982 Long term (current) use of aspirin: Secondary | ICD-10-CM | POA: Insufficient documentation

## 2017-03-08 DIAGNOSIS — Z7984 Long term (current) use of oral hypoglycemic drugs: Secondary | ICD-10-CM | POA: Insufficient documentation

## 2017-03-08 DIAGNOSIS — E119 Type 2 diabetes mellitus without complications: Secondary | ICD-10-CM | POA: Insufficient documentation

## 2017-03-08 DIAGNOSIS — Z79899 Other long term (current) drug therapy: Secondary | ICD-10-CM | POA: Insufficient documentation

## 2017-03-08 DIAGNOSIS — R0602 Shortness of breath: Secondary | ICD-10-CM | POA: Insufficient documentation

## 2017-03-08 DIAGNOSIS — I110 Hypertensive heart disease with heart failure: Secondary | ICD-10-CM | POA: Insufficient documentation

## 2017-03-08 LAB — CBC WITH DIFFERENTIAL/PLATELET
BASOS PCT: 0 %
Basophils Absolute: 0 10*3/uL (ref 0.0–0.1)
EOS ABS: 0.1 10*3/uL (ref 0.0–0.7)
EOS PCT: 2 %
HCT: 39.8 % (ref 36.0–46.0)
Hemoglobin: 13 g/dL (ref 12.0–15.0)
LYMPHS ABS: 1.1 10*3/uL (ref 0.7–4.0)
Lymphocytes Relative: 15 %
MCH: 30 pg (ref 26.0–34.0)
MCHC: 32.7 g/dL (ref 30.0–36.0)
MCV: 91.9 fL (ref 78.0–100.0)
MONOS PCT: 7 %
Monocytes Absolute: 0.5 10*3/uL (ref 0.1–1.0)
Neutro Abs: 5.6 10*3/uL (ref 1.7–7.7)
Neutrophils Relative %: 76 %
PLATELETS: 177 10*3/uL (ref 150–400)
RBC: 4.33 MIL/uL (ref 3.87–5.11)
RDW: 14.6 % (ref 11.5–15.5)
WBC: 7.4 10*3/uL (ref 4.0–10.5)

## 2017-03-08 LAB — COMPREHENSIVE METABOLIC PANEL
ALT: 19 U/L (ref 14–54)
ANION GAP: 8 (ref 5–15)
AST: 20 U/L (ref 15–41)
Albumin: 3.8 g/dL (ref 3.5–5.0)
Alkaline Phosphatase: 65 U/L (ref 38–126)
BUN: 19 mg/dL (ref 6–20)
CHLORIDE: 101 mmol/L (ref 101–111)
CO2: 26 mmol/L (ref 22–32)
Calcium: 9.3 mg/dL (ref 8.9–10.3)
Creatinine, Ser: 1.37 mg/dL — ABNORMAL HIGH (ref 0.44–1.00)
GFR calc non Af Amer: 34 mL/min — ABNORMAL LOW (ref >60)
GFR, EST AFRICAN AMERICAN: 39 mL/min — AB (ref >60)
Glucose, Bld: 174 mg/dL — ABNORMAL HIGH (ref 65–99)
POTASSIUM: 3.9 mmol/L (ref 3.5–5.1)
SODIUM: 135 mmol/L (ref 135–145)
Total Bilirubin: 0.5 mg/dL (ref 0.3–1.2)
Total Protein: 7.2 g/dL (ref 6.5–8.1)

## 2017-03-08 LAB — TROPONIN I

## 2017-03-08 LAB — BRAIN NATRIURETIC PEPTIDE: B Natriuretic Peptide: 199 pg/mL — ABNORMAL HIGH (ref 0.0–100.0)

## 2017-03-08 NOTE — ED Provider Notes (Signed)
[facility] DEPT Provider Note   CSN: [id number] Arrival date & time: 03/08/17  2108   By signing my name below, I, Hilbert Odor, attest that this documentation has been prepared under the direction and in the presence of Noemi Chapel, MD. Electronically Signed: Hilbert Odor, Scribe. 03/08/17. 9:39 PM. History   Chief Complaint Chief Complaint  Patient presents with  . Shortness of Breath    The history is provided by the patient and a relative. No language interpreter was used.  HPI Comments: Robin Guzman is a 83 y.o. female with hx of HTN, CHF, HLD, diabetes, and anxiety who presents to the Emergency Department complaining of SOB that began earlier tonight. She states that she was about to go to bed when she began to feel SOB. The patients power had went out a few days ago and she had been staying with family. Today is the first day she has been back home. Per Family: She went back home around 4 hours ago. She was fine earlier today and throughout her entire stay with her family. The patient has a hx of MI. She denies hx of cancer. The patient lives by herself. She denies abdominal pain, nausea, vomiting, or chest pains.  Past Medical History:  Diagnosis Date  . CHF (congestive heart failure) ([place])   . Diabetes mellitus without complication ([place])   . Dyslipidemia   . Dyspnea on exertion   . History of stress test 07/05/2006   High risk scan cardiac cathe was recommended.  [name] Hx of echocardiogram 04/09/2011   EF 55% Mildly hypertrophic left ventricle with normal systolic function, Moderate (grade II) diastolic dysfunction. Elevated left atrial pressure. No significant valvular abnormalities. No pericardial effusion. Mild to moderate pulmonary arterial hypertension.  . Hypercholesteremia   . Hypertension   . Myocardial infarct [facility])     Patient Active Problem List   Diagnosis Date Noted  . Closed 3-part fracture of proximal humerus, left, with routine healing,  subsequent encounter 12/02/2016  . UTI (urinary tract infection) 12/25/2013  . Dyspnea 12/25/2013  . Near syncope 12/25/2013  . CAD (coronary artery disease) 12/25/2013  . Chronic diastolic heart failure ([place]) 08/07/2013  . Essential hypertension 08/07/2013  . DM2 (diabetes mellitus, type 2) ([place]) 08/07/2013  . Hyperlipidemia 08/07/2013    Past Surgical History:  Procedure Laterality Date  . ABDOMINAL HYSTERECTOMY      OB History    Gravida Para Term Preterm AB Living   3 3 3          SAB TAB Ectopic Multiple Live Births                   Home Medications    Prior to Admission medications   Medication Sig Start Date End Date Taking? Authorizing Provider  carvedilol (COREG) 25 MG tablet Take 25 mg by mouth 2 (two) times daily with a meal.    Yes Historical Provider, MD  diltiazem (CARDIZEM) 30 MG tablet Take 1 tablet (30 mg total) by mouth 2 (two) times daily. 02/09/16  Yes Lendon Colonel, NP  enalapril (VASOTEC) 20 MG tablet Take 10 mg by mouth daily.    Yes Historical Provider, MD  ferrous sulfate 325 (65 FE) MG tablet Take 325 mg by mouth daily with breakfast.   Yes Historical Provider, MD  glipiZIDE (GLUCOTROL) 5 MG tablet Take 5 mg by mouth daily before breakfast.   Yes Historical Provider, MD  aspirin EC 81 MG tablet Take 81 mg by mouth daily.  Historical Provider, MD    Family History Family History  Problem Relation Age of Onset  . Cancer Mother   . Heart Problems Father   . Cancer Brother     brain  . Cancer Sister     brain    Social History Social History  Substance Use Topics  . Smoking status: Never Smoker  . Smokeless tobacco: Never Used  . Alcohol use No     Allergies   Advil [ibuprofen] and Daypro [oxaprozin]   Review of Systems Review of Systems  Constitutional: Negative for fever.  Respiratory: Positive for shortness of breath.   Gastrointestinal: Negative for abdominal pain, nausea and vomiting.  All other systems reviewed and  are negative.    Physical Exam Updated Vital Signs BP (!) 144/133   Pulse 65   Temp 97.9 F (36.6 C) (Oral)   Resp (!) 23   SpO2 99%   Physical Exam  Constitutional: She is oriented to person, place, and time. She appears well-developed and well-nourished. No distress.  HENT:  Head: Normocephalic and atraumatic.  Eyes: Conjunctivae and EOM are normal.  Neck: Neck supple. JVD present. No tracheal deviation present.  Mild JVD.  Cardiovascular: Normal rate.   Good radial pulses bilaterally. No edema of legs.  Pulmonary/Chest: Effort normal. No respiratory distress. She has no rales.  No rales. No increased work of breathing.  Abdominal: Soft.  Very soft abdomen. Not focal.  Musculoskeletal: Normal range of motion.  Neurological: She is alert and oriented to person, place, and time.  Skin: Skin is warm and dry.  Psychiatric: She has a normal mood and affect. Her behavior is normal.  Nursing note and vitals reviewed.  ED Treatments / Results  DIAGNOSTIC STUDIES: Oxygen Saturation is 97% on RA, normal by my interpretation.    COORDINATION OF CARE: 9:15 PM Discussed treatment plan with pt at bedside and pt agreed to plan. I will check the patient's CXR and labs.  Labs (all labs ordered are listed, but only abnormal results are displayed) Labs Reviewed  COMPREHENSIVE METABOLIC PANEL - Abnormal; Notable for the following:       Result Value   Glucose, Bld 174 (*)    Creatinine, Ser 1.37 (*)    GFR calc non Af Amer 34 (*)    GFR calc Af Amer 39 (*)    All other components within normal limits  BRAIN NATRIURETIC PEPTIDE - Abnormal; Notable for the following:    B Natriuretic Peptide 199.0 (*)    All other components within normal limits  CBC WITH DIFFERENTIAL/PLATELET  TROPONIN I    EKG  EKG Interpretation  Date/Time:  Tuesday March 08 2017 21:12:27 EDT Ventricular Rate:  100 PR Interval:    QRS Duration: 139 QT Interval:  410 QTC Calculation: 452 R  Axis:   -35 Text Interpretation:  Sinus tachycardia Atrial premature complexes in couplets Left bundle branch block Since last tracing Left bundle branch block has reoccurred PAC's now present Confirmed by Kourtnei Rauber  MD, Crowell ([id number]) on 03/08/2017 10:38:13 PM       Radiology Dg Chest 2 View  Result Date: 03/08/2017 CLINICAL DATA:  Shortness of breath EXAM: CHEST  2 VIEW COMPARISON:  12/05/2016 FINDINGS: There is cardiomegaly with mild central congestion. No focal consolidation or large pleural effusion. Atherosclerosis. No pneumothorax. Old fracture deformity of the proximal left humerus. IMPRESSION: 1. Cardiomegaly ; no edema.  Streaky atelectasis at the lung bases. 2. Old fracture deformity of the proximal left humerus. Electronically  Signed   By: Donavan Foil M.D.   On: 03/08/2017 21:54    Procedures Procedures (including critical care time)  Medications Ordered in ED Medications - No data to display   Initial Impression / Assessment and Plan / ED Course  I have reviewed the triage vital signs and the nursing notes.  Pertinent labs & imaging results that were available during my care of the patient were reviewed by me and considered in my medical decision making (see chart for details).     The patient's history is suggestive that she may have had some type of arrhythmic events, possibly ischemic event though the likelihood is low given her rather normal findings on exam as well as a normal troponin and BNP. I have looked at her chest x-ray which shows no signs of pulmonary edema, her EKG has been compared to multiple prior EKGs without any signs of significant changes as she goes between a left bundle branch block and normal sinus rhythm with a normal QRS back and forth. She has been worked up by cardiology extensively for this. I have discussed her care with cardiology this evening and after discussion with Dr. Harrell Gave who is on-call for cardiology the decision was made that we could  either do observation or she could go home depending on her home situation.  On repeat exam at 11:30 PM the patient is now chest pain and shortness of breath free.  She has been offered admission but she declines and wants to go home.  She agrees to return should sx worsen.  Final Clinical Impressions(s) / ED Diagnoses   Final diagnoses:  Shortness of breath    New Prescriptions New Prescriptions   No medications on file   I personally performed the services described in this documentation, which was scribed in my presence. The recorded information has been reviewed and is accurate.       Noemi Chapel, MD 03/08/17 [phone]

## 2017-03-08 NOTE — ED Triage Notes (Signed)
Per family pt has been weak, and sob. Family states they think her heart is out rhythm and could not get a blood pressure at home.

## 2017-03-08 NOTE — Discharge Instructions (Signed)
I have offered you admission to the hospital but after discussion you have chosen to go home.  Your testing has been normal but you may return at any time for further evaluation, especially if your symptoms worsen.  Call your doctor in the morning for recheck in the next 48 hours.

## 2017-03-11 DIAGNOSIS — N183 Chronic kidney disease, stage 3 (moderate): Secondary | ICD-10-CM | POA: Diagnosis not present

## 2017-03-11 DIAGNOSIS — R0602 Shortness of breath: Secondary | ICD-10-CM | POA: Diagnosis not present

## 2017-03-11 DIAGNOSIS — I10 Essential (primary) hypertension: Secondary | ICD-10-CM | POA: Diagnosis not present

## 2017-03-11 DIAGNOSIS — E1122 Type 2 diabetes mellitus with diabetic chronic kidney disease: Secondary | ICD-10-CM | POA: Diagnosis not present

## 2017-03-19 ENCOUNTER — Other Ambulatory Visit: Payer: Self-pay | Admitting: Adult Health

## 2017-04-01 ENCOUNTER — Ambulatory Visit (INDEPENDENT_AMBULATORY_CARE_PROVIDER_SITE_OTHER): Payer: Medicare Other | Admitting: Adult Health

## 2017-04-01 ENCOUNTER — Encounter: Payer: Self-pay | Admitting: Adult Health

## 2017-04-01 VITALS — BP 140/84 | HR 90 | Ht 60.0 in | Wt 151.0 lb

## 2017-04-01 DIAGNOSIS — I10 Essential (primary) hypertension: Secondary | ICD-10-CM | POA: Diagnosis not present

## 2017-04-01 DIAGNOSIS — I5032 Chronic diastolic (congestive) heart failure: Secondary | ICD-10-CM

## 2017-04-01 MED ORDER — DILTIAZEM HCL 30 MG PO TABS: 30.0000 mg | ORAL_TABLET | Freq: Two times a day (BID) | ORAL | 6 refills | Status: DC

## 2017-04-01 NOTE — Progress Notes (Signed)
Cardiology Office Note   Date:  04/01/2017   ID:  Robin Guzman, DOB [birth year], MRN [id number]  PCP:  Celene Squibb, MD  Cardiologist: Assunta Curtis, NP   Chief Complaint  Patient presents with  . Hypertension  . Coronary Artery Disease  . Congestive Heart Failure      History of Present Illness: Robin Guzman is a 83 y.o. female who presents for ongoing assessment and management of CAD, CHF, SVT, hypertension, with history of diabetes and hypotension. She was last seen 09/09/2016 and clinically stable. She was on two CCB, diltiazem and amlodipine on review of her medications. She was taken off of amlodipine and diltiazem was increased to 30 mg BID. She was seen in the ER for complaints of chest pain and dyspnea on 03/08/2017. She was observed with resolution of symptoms without treatment. Labs were WNL, no acute changes to EKG.  She has not had any complaints since she was in ER. She admits to being very anxious and feels dyspnea or tightness Her daughter who is with her states that she is easily anxious and this causes her to have chest pain. When family and friends are around, she does much better.   Past Medical History:  Diagnosis Date  . CHF (congestive heart failure) ([place])   . Diabetes mellitus without complication ([place])   . Dyslipidemia   . Dyspnea on exertion   . History of stress test 07/05/2006   High risk scan cardiac cathe was recommended.  [name] Hx of echocardiogram 04/09/2011   EF 55% Mildly hypertrophic left ventricle with normal systolic function, Moderate (grade II) diastolic dysfunction. Elevated left atrial pressure. No significant valvular abnormalities. No pericardial effusion. Mild to moderate pulmonary arterial hypertension.  . Hypercholesteremia   . Hypertension   . Myocardial infarct [facility])     Past Surgical History:  Procedure Laterality Date  . ABDOMINAL HYSTERECTOMY       Current Outpatient Prescriptions  Medication Sig Dispense  Refill  . aspirin EC 81 MG tablet Take 81 mg by mouth daily.    . carvedilol (COREG) 25 MG tablet Take 25 mg by mouth 2 (two) times daily with a meal.     . diltiazem (CARDIZEM) 30 MG tablet TAKE ONE TABLET BY MOUTH TWICE A DAY 60 tablet 0  . enalapril (VASOTEC) 20 MG tablet Take 10 mg by mouth daily.     . ferrous sulfate 325 (65 FE) MG tablet Take 325 mg by mouth daily with breakfast.    . glipiZIDE (GLUCOTROL) 5 MG tablet Take 5 mg by mouth daily before breakfast.     No current facility-administered medications for this visit.     Allergies:   Advil [ibuprofen] and Daypro [oxaprozin]    Social History:  The patient  reports that she has never smoked. She has never used smokeless tobacco. She reports that she does not drink alcohol or use drugs.   Family History:  The patient's family history includes Cancer in her brother, mother, and sister; Heart Problems in her father.    ROS: All other systems are reviewed and negative. Unless otherwise mentioned in H&P    PHYSICAL EXAM: VS:  BP 140/84   Pulse 90   Ht 5' (1.524 m)   Wt 151 lb (68.5 kg)   SpO2 96%   BMI 29.49 kg/m  , BMI Body mass index is 29.49 kg/m. GEN: Well nourished, well developed, in no acute distress  HEENT: normal  Neck: no JVD,  carotid bruits, or masses Cardiac: RRR; no murmurs, rubs, or gallops,no edema  Respiratory:  clear to auscultation bilaterally, normal work of breathing GI: soft, nontender, nondistended, + BS MS: no deformity or atrophy  Skin: warm and dry, no rash Neuro:  Strength and sensation are intact very hard of hearing.  Psych: euthymic mood, full affect   Recent Labs: 03/08/2017: ALT 19; B Natriuretic Peptide 199.0; BUN 19; Creatinine, Ser 1.37; Hemoglobin 13.0; Platelets 177; Potassium 3.9; Sodium 135    Lipid Panel No results found for: CHOL, TRIG, HDL, CHOLHDL, VLDL, LDLCALC, LDLDIRECT    Wt Readings from Last 3 Encounters:  04/01/17 151 lb (68.5 kg)  12/16/16 147 lb (66.7 kg)   12/05/16 150 lb (68 kg)      Other studies Reviewed: Echocardiogram 2016-03-04 Left ventricle: The cavity size was normal. Wall thickness was   increased in a pattern of mild LVH. The estimated ejection   fraction was 55%. Doppler parameters are consistent with abnormal   left ventricular relaxation (grade 1 diastolic dysfunction). - Aortic valve: There was mild regurgitation. - Atrial septum: No defect or patent foramen ovale was identified. - Pericardium, extracardiac: Small pericardial effusion with no   tamponade.  ASSESSMENT AND PLAN:  1. Hypertension: Currently well controlled on regimen. No changes to diltiazem dose. She is doing well off of amlodipine.   2. CHF: Repeat echocardiogram reveals normal LV EF. No evidence of decompensation. Continue coreg and enalapril.   3. Anxiety: Causes chest discomfort and some dyspnea. I have advised her daughter to get her out of the house every day and have her be among others her age. She is to find distractions to keep her mind busy and less anxious. Daughter will assist with this and her brother will encourage her to be out as well.    Current medicines are reviewed at length with the patient today.    Labs/ tests ordered today include:  No orders of the defined types were placed in this encounter.    Disposition:   FU with 6 months.   Signed, @Mid  February and now@  04/01/2017 4:38 PM    [facility]  [address] [place], [place] [address] Phone: [phone]; Fax: [phone]

## 2017-04-01 NOTE — Patient Instructions (Addendum)
Your physician wants you to follow-up in: 6 Months.  You will receive a reminder letter in the mail two months in advance. If you don't receive a letter, please call our office to schedule the follow-up appointment.  Your physician recommends that you continue on your current medications as directed. Please refer to the Current Medication list given to you today.  If you need a refill on your cardiac medications before your next appointment, please call your pharmacy.  Thank you for choosing [facility]!   

## 2017-05-09 DIAGNOSIS — H353131 Nonexudative age-related macular degeneration, bilateral, early dry stage: Secondary | ICD-10-CM | POA: Diagnosis not present

## 2017-05-09 DIAGNOSIS — H1851 Endothelial corneal dystrophy: Secondary | ICD-10-CM | POA: Diagnosis not present

## 2017-05-09 DIAGNOSIS — H2511 Age-related nuclear cataract, right eye: Secondary | ICD-10-CM | POA: Diagnosis not present

## 2017-05-09 DIAGNOSIS — H2513 Age-related nuclear cataract, bilateral: Secondary | ICD-10-CM | POA: Diagnosis not present

## 2017-05-13 ENCOUNTER — Encounter (HOSPITAL_COMMUNITY)
Admission: RE | Admit: 2017-05-13 | Discharge: 2017-05-13 | Disposition: A | Discharge status: Home / Self Care | Payer: Medicare Other | Source: Ambulatory Visit | Attending: Ophthalmology | Admitting: Ophthalmology

## 2017-05-13 ENCOUNTER — Encounter (HOSPITAL_COMMUNITY): Payer: Self-pay

## 2017-05-20 ENCOUNTER — Ambulatory Visit (HOSPITAL_COMMUNITY)
Admission: RE | Admit: 2017-05-20 | Discharge: 2017-05-20 | Disposition: A | Discharge status: Home / Self Care | Payer: Medicare Other | Source: Ambulatory Visit | Attending: Ophthalmology | Admitting: Ophthalmology

## 2017-05-20 ENCOUNTER — Encounter (HOSPITAL_COMMUNITY): Payer: Self-pay | Admitting: Ophthalmology

## 2017-05-20 ENCOUNTER — Ambulatory Visit (HOSPITAL_COMMUNITY): Payer: Medicare Other | Admitting: Anesthesiology

## 2017-05-20 ENCOUNTER — Encounter (HOSPITAL_COMMUNITY)
Admission: RE | Disposition: A | Discharge status: Home / Self Care | Payer: Self-pay | Source: Ambulatory Visit | Attending: Ophthalmology

## 2017-05-20 DIAGNOSIS — E1136 Type 2 diabetes mellitus with diabetic cataract: Secondary | ICD-10-CM | POA: Insufficient documentation

## 2017-05-20 DIAGNOSIS — F419 Anxiety disorder, unspecified: Secondary | ICD-10-CM | POA: Diagnosis not present

## 2017-05-20 DIAGNOSIS — E785 Hyperlipidemia, unspecified: Secondary | ICD-10-CM | POA: Diagnosis not present

## 2017-05-20 DIAGNOSIS — H2511 Age-related nuclear cataract, right eye: Secondary | ICD-10-CM | POA: Diagnosis not present

## 2017-05-20 DIAGNOSIS — N390 Urinary tract infection, site not specified: Secondary | ICD-10-CM | POA: Insufficient documentation

## 2017-05-20 DIAGNOSIS — I252 Old myocardial infarction: Secondary | ICD-10-CM | POA: Insufficient documentation

## 2017-05-20 DIAGNOSIS — Z809 Family history of malignant neoplasm, unspecified: Secondary | ICD-10-CM | POA: Diagnosis not present

## 2017-05-20 DIAGNOSIS — Z8249 Family history of ischemic heart disease and other diseases of the circulatory system: Secondary | ICD-10-CM | POA: Diagnosis not present

## 2017-05-20 DIAGNOSIS — I2510 Atherosclerotic heart disease of native coronary artery without angina pectoris: Secondary | ICD-10-CM | POA: Diagnosis not present

## 2017-05-20 DIAGNOSIS — I110 Hypertensive heart disease with heart failure: Secondary | ICD-10-CM | POA: Diagnosis not present

## 2017-05-20 DIAGNOSIS — I5032 Chronic diastolic (congestive) heart failure: Secondary | ICD-10-CM | POA: Insufficient documentation

## 2017-05-20 DIAGNOSIS — Z886 Allergy status to analgesic agent status: Secondary | ICD-10-CM | POA: Insufficient documentation

## 2017-05-20 DIAGNOSIS — E7800 Pure hypercholesterolemia, unspecified: Secondary | ICD-10-CM | POA: Insufficient documentation

## 2017-05-20 DIAGNOSIS — H269 Unspecified cataract: Secondary | ICD-10-CM | POA: Diagnosis not present

## 2017-05-20 HISTORY — PX: CATARACT EXTRACTION W/PHACO: SHX586

## 2017-05-20 LAB — GLUCOSE, CAPILLARY: Glucose-Capillary: 146 mg/dL — ABNORMAL HIGH (ref 65–99)

## 2017-05-20 SURGERY — PHACOEMULSIFICATION, CATARACT, WITH IOL INSERTION
Anesthesia: Monitor Anesthesia Care | Site: Eye | Laterality: Right

## 2017-05-20 MED ORDER — MIDAZOLAM HCL 2 MG/2ML IJ SOLN
1.0000 mg | Freq: Once | INTRAMUSCULAR | Status: AC | PRN
  Administered 2017-05-20: 1 mg via INTRAVENOUS

## 2017-05-20 MED ORDER — PHENYLEPHRINE HCL 2.5 % OP SOLN
1.0000 [drp] | OPHTHALMIC | Status: AC
  Administered 2017-05-20 (×3): 1 [drp] via OPHTHALMIC

## 2017-05-20 MED ORDER — LIDOCAINE HCL 3.5 % OP GEL
1.0000 "application " | Freq: Once | OPHTHALMIC | Status: AC
  Administered 2017-05-20: 1 via OPHTHALMIC

## 2017-05-20 MED ORDER — EPINEPHRINE PF 1 MG/ML IJ SOLN
INTRAMUSCULAR | Status: AC
  Filled 2017-05-20: qty 1

## 2017-05-20 MED ORDER — BSS IO SOLN
INTRAOCULAR | Status: DC | PRN
  Administered 2017-05-20: 15 mL via INTRAOCULAR

## 2017-05-20 MED ORDER — TETRACAINE HCL 0.5 % OP SOLN
1.0000 [drp] | OPHTHALMIC | Status: AC
  Administered 2017-05-20 (×3): 1 [drp] via OPHTHALMIC

## 2017-05-20 MED ORDER — NEOMYCIN-POLYMYXIN-DEXAMETH 3.5-10000-0.1 OP SUSP
OPHTHALMIC | Status: DC | PRN
Start: 2017-05-20 — End: 2017-05-20
  Administered 2017-05-20: 2 [drp] via OPHTHALMIC

## 2017-05-20 MED ORDER — POVIDONE-IODINE 5 % OP SOLN
OPHTHALMIC | Status: DC | PRN
  Administered 2017-05-20: 1 via OPHTHALMIC

## 2017-05-20 MED ORDER — MIDAZOLAM HCL 2 MG/2ML IJ SOLN
INTRAMUSCULAR | Status: AC
  Filled 2017-05-20: qty 2

## 2017-05-20 MED ORDER — CYCLOPENTOLATE-PHENYLEPHRINE 0.2-1 % OP SOLN
1.0000 [drp] | OPHTHALMIC | Status: AC
  Administered 2017-05-20 (×3): 1 [drp] via OPHTHALMIC

## 2017-05-20 MED ORDER — LACTATED RINGERS IV SOLN
INTRAVENOUS | Status: DC
  Administered 2017-05-20: 08:00:00 via INTRAVENOUS

## 2017-05-20 MED ORDER — EPINEPHRINE PF 1 MG/ML IJ SOLN
INTRAMUSCULAR | Status: DC | PRN
  Administered 2017-05-20: 500 mL

## 2017-05-20 MED ORDER — NA HYALUR & NA CHOND-NA HYALUR 0.55-0.5 ML IO KIT
PACK | INTRAOCULAR | Status: DC | PRN
  Administered 2017-05-20: 1 via OPHTHALMIC

## 2017-05-20 MED ORDER — EPINEPHRINE PF 1 MG/ML IJ SOLN
INTRAOCULAR | Status: DC | PRN
  Administered 2017-05-20: .8 mL via OPHTHALMIC

## 2017-05-20 SURGICAL SUPPLY — 12 items

## 2017-05-20 NOTE — Anesthesia Postprocedure Evaluation (Signed)
Anesthesia Post Note  Patient: Robin Guzman  Procedure(s) Performed: Procedure(s) (LRB): CATARACT EXTRACTION PHACO AND INTRAOCULAR LENS PLACEMENT RIGHT EYE (Right)  Anesthesia Type: MAC     Last Vitals:  Vitals:   05/20/17 0806  BP: (!) 144/74  Pulse: 62  Temp: 36.7 C    Last Pain:  Vitals:   05/20/17 0806  TempSrc: Oral                 Aarya Robinson

## 2017-05-20 NOTE — Anesthesia Postprocedure Evaluation (Signed)
Anesthesia Post Note  Patient: ASENETH HACK  Procedure(s) Performed: Procedure(s) (LRB): CATARACT EXTRACTION PHACO AND INTRAOCULAR LENS PLACEMENT RIGHT EYE (Right)  Patient location during evaluation: Short Stay Anesthesia Type: MAC Level of consciousness: awake and alert and oriented Pain management: pain level controlled Vital Signs Assessment: post-procedure vital signs reviewed and stable Respiratory status: spontaneous breathing Cardiovascular status: blood pressure returned to baseline Postop Assessment: no signs of nausea or vomiting Anesthetic complications: no     Last Vitals:  Vitals:   05/20/17 0806  BP: (!) 144/74  Pulse: 62  Temp: 36.7 C    Last Pain:  Vitals:   05/20/17 0806  TempSrc: Oral                 Mallory Schaad

## 2017-05-20 NOTE — H&P (Signed)
I have reviewed the H&P, the patient was re-examined, and I have identified no interval changes in medical condition and plan of care since the history and physical of record  

## 2017-05-20 NOTE — Op Note (Signed)
Date of Admission: 05/20/2017  Date of Surgery: 05/20/2017  Pre-Op Dx: Cataract Right  Eye  Post-Op Dx: Senile Nuclear Cataract  Right  Eye,  Dx Code H25.11  Surgeon: Tonny Branch, M.D.  Assistants: None  Anesthesia: Topical with MAC  Indications: Painless, progressive loss of vision with compromise of daily activities.  Surgery: Cataract Extraction with Intraocular lens Implant Right Eye  Discription: The patient had dilating drops and viscous lidocaine placed into the Right eye in the pre-op holding area. After transfer to the operating room, a time out was performed. The patient was then prepped and draped. Beginning with a 71m blade a paracentesis port was made at the surgeon's 2 o'clock position. The anterior chamber was then filled with 1% non-preserved lidocaine. This was followed by filling the anterior chamber with Provisc.  A 2.433mkeratome blade was used to make a clear corneal incision at the temporal limbus.  A bent cystatome needle was used to create a continuous tear capsulotomy. Hydrodissection was performed with balanced salt solution on a Fine canula. The lens nucleus was then removed using the phacoemulsification handpiece. Residual cortex was removed with the I&A handpiece. The anterior chamber and capsular bag were refilled with Provisc. A posterior chamber intraocular lens was placed into the capsular bag with it's injector. The implant was positioned with the Kuglan hook. The Provisc was then removed from the anterior chamber and capsular bag with the I&A handpiece. Stromal hydration of the main incision and paracentesis port was performed with BSS on a Fine canula. The wounds were tested for leak which was negative. The patient tolerated the procedure well. There were no operative complications. The patient was then transferred to the recovery room in stable condition.  Complications: None  Specimen: None  EBL: None  Prosthetic device: Abbott Technis, PCB00, power 23.0,  SN [id number]

## 2017-05-20 NOTE — Discharge Instructions (Signed)
Moderate Conscious Sedation, Adult, Care After  These instructions provide you with information about caring for yourself after your procedure. Your health care provider may also give you more specific instructions. Your treatment has been planned according to current medical practices, but problems sometimes occur. Call your health care provider if you have any problems or questions after your procedure.  What can I expect after the procedure?  After your procedure, it is common:   To feel sleepy for several hours.   To feel clumsy and have poor balance for several hours.   To have poor judgment for several hours.   To vomit if you eat too soon.    Follow these instructions at home:  For at least 24 hours after the procedure:     Do not:  ? Participate in activities where you could fall or become injured.  ? Drive.  ? Use heavy machinery.  ? Drink alcohol.  ? Take sleeping pills or medicines that cause drowsiness.  ? Make important decisions or sign legal documents.  ? Take care of children on your own.   Rest.  Eating and drinking   Follow the diet recommended by your health care provider.   If you vomit:  ? Drink water, juice, or soup when you can drink without vomiting.  ? Make sure you have little or no nausea before eating solid foods.  General instructions   Have a responsible adult stay with you until you are awake and alert.   Take over-the-counter and prescription medicines only as told by your health care provider.   If you smoke, do not smoke without supervision.   Keep all follow-up visits as told by your health care provider. This is important.  Contact a health care provider if:   You keep feeling nauseous or you keep vomiting.   You feel light-headed.   You develop a rash.   You have a fever.  Get help right away if:   You have trouble breathing.  This information is not intended to replace advice given to you by your health care provider. Make sure you discuss any questions you have  with your health care provider.  Document Released: 08/29/2013 Document Revised: 04/12/2016 Document Reviewed: 02/28/2016  Elsevier Interactive Patient Education  2018 [employer]

## 2017-05-20 NOTE — Anesthesia Preprocedure Evaluation (Signed)
Anesthesia Evaluation  Patient identified by MRN, date of birth, ID band Patient awake    Airway Mallampati: I       Dental  (+) Poor Dentition, Missing   Pulmonary shortness of breath and with exertion,     + decreased breath sounds      Cardiovascular Exercise Tolerance: Poor Pt. on medications and Pt. on home beta blockers + CAD, + Past MI (Stable, no CP) and +CHF  Normal cardiovascular exam Rhythm:Regular Rate:Normal     Neuro/Psych Anxiety    GI/Hepatic   Endo/Other  diabetes, Well Controlled, Type 2  Renal/GU      Musculoskeletal   Abdominal Normal abdominal exam  (+)   Peds  Hematology   Anesthesia Other Findings   Reproductive/Obstetrics                             Anesthesia Physical Anesthesia Plan  ASA: IV  Anesthesia Plan: MAC   Post-op Pain Management:    Induction: Intravenous  PONV Risk Score and Plan:   Airway Management Planned: Nasal Cannula  Additional Equipment:   Intra-op Plan:   Post-operative Plan:   Informed Consent: I have reviewed the patients History and Physical, chart, labs and discussed the procedure including the risks, benefits and alternatives for the proposed anesthesia with the patient or authorized representative who has indicated his/her understanding and acceptance.     Plan Discussed with: CRNA  Anesthesia Plan Comments:         Anesthesia Quick Evaluation

## 2017-05-20 NOTE — Transfer of Care (Signed)
Immediate Anesthesia Transfer of Care Note  Patient: Robin Guzman  Procedure(s) Performed: Procedure(s) with comments: CATARACT EXTRACTION PHACO AND INTRAOCULAR LENS PLACEMENT RIGHT EYE (Right) - CDE: 17.77  Patient Location: PACU  Anesthesia Type:MAC  Level of Consciousness: awake  Airway & Oxygen Therapy: Patient Spontanous Breathing  Post-op Assessment: Report given to RN  Post vital signs: Reviewed and stable  Last Vitals:  Vitals:   05/20/17 0806  BP: (!) 144/74  Pulse: 62  Temp: 36.7 C    Last Pain:  Vitals:   05/20/17 0806  TempSrc: Oral         Complications: No apparent anesthesia complications

## 2017-05-20 NOTE — Addendum Note (Signed)
Addendum  created 05/20/17 1016 by Ollen Bowl, CRNA   Anesthesia Staff edited

## 2017-05-23 ENCOUNTER — Encounter (HOSPITAL_COMMUNITY): Payer: Self-pay | Admitting: Ophthalmology

## 2017-06-08 DIAGNOSIS — H2512 Age-related nuclear cataract, left eye: Secondary | ICD-10-CM | POA: Diagnosis not present

## 2017-06-16 ENCOUNTER — Encounter (HOSPITAL_COMMUNITY)
Admission: RE | Admit: 2017-06-16 | Discharge: 2017-06-16 | Disposition: A | Discharge status: Home / Self Care | Payer: Medicare Other | Source: Ambulatory Visit | Attending: Ophthalmology | Admitting: Ophthalmology

## 2017-06-16 ENCOUNTER — Encounter (HOSPITAL_COMMUNITY): Payer: Self-pay

## 2017-06-20 ENCOUNTER — Other Ambulatory Visit: Payer: Self-pay

## 2017-06-20 MED ORDER — CYCLOPENTOLATE-PHENYLEPHRINE 0.2-1 % OP SOLN
OPHTHALMIC | Status: AC
  Filled 2017-06-20: qty 2

## 2017-06-20 MED ORDER — NEOMYCIN-POLYMYXIN-DEXAMETH 3.5-10000-0.1 OP SUSP
OPHTHALMIC | Status: AC
  Filled 2017-06-20: qty 5

## 2017-06-20 MED ORDER — LIDOCAINE HCL 3.5 % OP GEL
OPHTHALMIC | Status: AC
  Filled 2017-06-20: qty 1

## 2017-06-20 MED ORDER — TETRACAINE HCL 0.5 % OP SOLN
OPHTHALMIC | Status: AC
  Filled 2017-06-20: qty 4

## 2017-06-20 MED ORDER — PHENYLEPHRINE HCL 2.5 % OP SOLN
OPHTHALMIC | Status: AC
  Filled 2017-06-20: qty 15

## 2017-06-20 MED ORDER — LIDOCAINE HCL (PF) 1 % IJ SOLN
INTRAMUSCULAR | Status: AC
  Filled 2017-06-20: qty 2

## 2017-06-21 ENCOUNTER — Encounter (HOSPITAL_COMMUNITY): Payer: Self-pay

## 2017-06-21 ENCOUNTER — Ambulatory Visit (HOSPITAL_COMMUNITY)
Admission: RE | Admit: 2017-06-21 | Discharge: 2017-06-21 | Disposition: A | Discharge status: Home / Self Care | Payer: Medicare Other | Source: Ambulatory Visit | Attending: Ophthalmology | Admitting: Ophthalmology

## 2017-06-21 ENCOUNTER — Ambulatory Visit (HOSPITAL_COMMUNITY): Payer: Medicare Other | Admitting: Anesthesiology

## 2017-06-21 ENCOUNTER — Encounter (HOSPITAL_COMMUNITY)
Admission: RE | Disposition: A | Discharge status: Home / Self Care | Payer: Self-pay | Source: Ambulatory Visit | Attending: Ophthalmology

## 2017-06-21 DIAGNOSIS — E119 Type 2 diabetes mellitus without complications: Secondary | ICD-10-CM | POA: Insufficient documentation

## 2017-06-21 DIAGNOSIS — I2510 Atherosclerotic heart disease of native coronary artery without angina pectoris: Secondary | ICD-10-CM | POA: Diagnosis not present

## 2017-06-21 DIAGNOSIS — R0602 Shortness of breath: Secondary | ICD-10-CM | POA: Insufficient documentation

## 2017-06-21 DIAGNOSIS — I252 Old myocardial infarction: Secondary | ICD-10-CM | POA: Insufficient documentation

## 2017-06-21 DIAGNOSIS — I509 Heart failure, unspecified: Secondary | ICD-10-CM | POA: Insufficient documentation

## 2017-06-21 DIAGNOSIS — I110 Hypertensive heart disease with heart failure: Secondary | ICD-10-CM | POA: Diagnosis not present

## 2017-06-21 DIAGNOSIS — F419 Anxiety disorder, unspecified: Secondary | ICD-10-CM | POA: Diagnosis not present

## 2017-06-21 DIAGNOSIS — H2512 Age-related nuclear cataract, left eye: Secondary | ICD-10-CM | POA: Insufficient documentation

## 2017-06-21 HISTORY — PX: CATARACT EXTRACTION W/PHACO: SHX586

## 2017-06-21 LAB — GLUCOSE, CAPILLARY: GLUCOSE-CAPILLARY: 125 mg/dL — AB (ref 65–99)

## 2017-06-21 SURGERY — PHACOEMULSIFICATION, CATARACT, WITH IOL INSERTION
Anesthesia: Monitor Anesthesia Care | Site: Eye | Laterality: Left

## 2017-06-21 MED ORDER — FENTANYL CITRATE (PF) 100 MCG/2ML IJ SOLN
25.0000 ug | Freq: Once | INTRAMUSCULAR | Status: AC
  Administered 2017-06-21: 25 ug via INTRAVENOUS

## 2017-06-21 MED ORDER — NEOMYCIN-POLYMYXIN-DEXAMETH 3.5-10000-0.1 OP SUSP
OPHTHALMIC | Status: DC | PRN
  Administered 2017-06-21: 2 [drp] via OPHTHALMIC

## 2017-06-21 MED ORDER — TETRACAINE HCL 0.5 % OP SOLN
1.0000 [drp] | OPHTHALMIC | Status: AC
  Administered 2017-06-21 (×3): 1 [drp] via OPHTHALMIC

## 2017-06-21 MED ORDER — FENTANYL CITRATE (PF) 100 MCG/2ML IJ SOLN
INTRAMUSCULAR | Status: AC
  Filled 2017-06-21: qty 2

## 2017-06-21 MED ORDER — MIDAZOLAM HCL 2 MG/2ML IJ SOLN
INTRAMUSCULAR | Status: AC
  Filled 2017-06-21: qty 2

## 2017-06-21 MED ORDER — CYCLOPENTOLATE-PHENYLEPHRINE 0.2-1 % OP SOLN
1.0000 [drp] | OPHTHALMIC | Status: AC
  Administered 2017-06-21 (×3): 1 [drp] via OPHTHALMIC

## 2017-06-21 MED ORDER — BSS IO SOLN
INTRAOCULAR | Status: DC | PRN
  Administered 2017-06-21: 15 mL via INTRAOCULAR

## 2017-06-21 MED ORDER — MIDAZOLAM HCL 2 MG/2ML IJ SOLN
1.0000 mg | INTRAMUSCULAR | Status: AC
  Administered 2017-06-21: 2 mg via INTRAVENOUS

## 2017-06-21 MED ORDER — POVIDONE-IODINE 5 % OP SOLN
OPHTHALMIC | Status: DC | PRN
  Administered 2017-06-21: 1 via OPHTHALMIC

## 2017-06-21 MED ORDER — EPINEPHRINE PF 1 MG/ML IJ SOLN
INTRAOCULAR | Status: DC | PRN
  Administered 2017-06-21: 500 mL

## 2017-06-21 MED ORDER — LIDOCAINE HCL (PF) 1 % IJ SOLN
INTRAMUSCULAR | Status: DC | PRN
  Administered 2017-06-21: .7 mL via OPHTHALMIC

## 2017-06-21 MED ORDER — LACTATED RINGERS IV SOLN
INTRAVENOUS | Status: DC
  Administered 2017-06-21: 08:00:00 via INTRAVENOUS

## 2017-06-21 MED ORDER — NA HYALUR & NA CHOND-NA HYALUR 0.55-0.5 ML IO KIT
PACK | INTRAOCULAR | Status: DC | PRN
  Administered 2017-06-21: 1 via OPHTHALMIC

## 2017-06-21 MED ORDER — LIDOCAINE HCL 3.5 % OP GEL
1.0000 "application " | Freq: Once | OPHTHALMIC | Status: AC
  Administered 2017-06-21: 1 via OPHTHALMIC

## 2017-06-21 MED ORDER — PHENYLEPHRINE HCL 2.5 % OP SOLN
1.0000 [drp] | OPHTHALMIC | Status: AC
  Administered 2017-06-21 (×3): 1 [drp] via OPHTHALMIC

## 2017-06-21 SURGICAL SUPPLY — 13 items
CLOTH BEACON ORANGE TIMEOUT ST (SAFETY) ×1 IMPLANT
EYE SHIELD UNIVERSAL CLEAR (GAUZE/BANDAGES/DRESSINGS) ×1 IMPLANT
GLOVE BIOGEL PI IND STRL 7.0 (GLOVE) IMPLANT
GLOVE BIOGEL PI INDICATOR 7.0 (GLOVE) ×2
LENS ALC ACRYL/TECN (Ophthalmic Related) ×1 IMPLANT
PAD ARMBOARD 7.5X6 YLW CONV (MISCELLANEOUS) ×1 IMPLANT
RETRACTOR IRIS SIGHTPATH (OPHTHALMIC RELATED) IMPLANT
RING MALYGIN (MISCELLANEOUS) IMPLANT
SYRINGE LUER LOK 1CC (MISCELLANEOUS) ×1 IMPLANT
TAPE SURG TRANSPORE 1 IN (GAUZE/BANDAGES/DRESSINGS) IMPLANT
TAPE SURGICAL TRANSPORE 1 IN (GAUZE/BANDAGES/DRESSINGS) ×1
VISCOELASTIC ADDITIONAL (OPHTHALMIC RELATED) IMPLANT
WATER STERILE IRR 250ML POUR (IV SOLUTION) ×1 IMPLANT

## 2017-06-21 NOTE — Transfer of Care (Signed)
Immediate Anesthesia Transfer of Care Note  Patient: Robin Guzman  Procedure(s) Performed: Procedure(s) with comments: CATARACT EXTRACTION PHACO AND INTRAOCULAR LENS PLACEMENT (IOC) (Left) - CDE: 8.90  Patient Location: Short Stay  Anesthesia Type:MAC  Level of Consciousness: awake  Airway & Oxygen Therapy: Patient Spontanous Breathing  Post-op Assessment: Report given to RN  Post vital signs: Reviewed  Last Vitals:  Vitals:   06/21/17 0815 06/21/17 0820  BP: (!) 159/81   Pulse:    Resp: (!) 22 (!) 26  Temp:      Last Pain:  Vitals:   06/21/17 0702  TempSrc: Oral      Patients Stated Pain Goal: 5 (24/09/73 5329)  Complications: No apparent anesthesia complications

## 2017-06-21 NOTE — Discharge Instructions (Signed)

## 2017-06-21 NOTE — Anesthesia Postprocedure Evaluation (Signed)
Anesthesia Post Note  Patient: BRITANY CALLICOTT  Procedure(s) Performed: Procedure(s) (LRB): CATARACT EXTRACTION PHACO AND INTRAOCULAR LENS PLACEMENT (IOC) (Left)  Patient location during evaluation: Short Stay Anesthesia Type: MAC Level of consciousness: awake and alert and oriented Pain management: pain level controlled Vital Signs Assessment: vitals unstable Respiratory status: spontaneous breathing Cardiovascular status: stable Postop Assessment: no signs of nausea or vomiting Anesthetic complications: no     Last Vitals:  Vitals:   06/21/17 0815 06/21/17 0820  BP: (!) 159/81   Pulse:    Resp: (!) 22 (!) 26  Temp:      Last Pain:  Vitals:   06/21/17 0702  TempSrc: Oral                 Deondrea Aguado

## 2017-06-21 NOTE — Anesthesia Preprocedure Evaluation (Signed)
Anesthesia Evaluation  Patient identified by MRN, date of birth, ID band Patient awake    Airway Mallampati: I       Dental  (+) Poor Dentition, Missing   Pulmonary shortness of breath and with exertion,     + decreased breath sounds      Cardiovascular Exercise Tolerance: Poor hypertension, Pt. on medications and Pt. on home beta blockers + CAD, + Past MI (Stable, no CP) and +CHF  Normal cardiovascular exam Rhythm:Regular Rate:Normal     Neuro/Psych Anxiety    GI/Hepatic   Endo/Other  diabetes, Well Controlled, Type 2  Renal/GU      Musculoskeletal   Abdominal Normal abdominal exam  (+)   Peds  Hematology   Anesthesia Other Findings   Reproductive/Obstetrics                             Anesthesia Physical Anesthesia Plan  ASA: IV  Anesthesia Plan: MAC   Post-op Pain Management:    Induction: Intravenous  PONV Risk Score and Plan:   Airway Management Planned: Nasal Cannula  Additional Equipment:   Intra-op Plan:   Post-operative Plan:   Informed Consent: I have reviewed the patients History and Physical, chart, labs and discussed the procedure including the risks, benefits and alternatives for the proposed anesthesia with the patient or authorized representative who has indicated his/her understanding and acceptance.     Plan Discussed with: CRNA  Anesthesia Plan Comments:         Anesthesia Quick Evaluation

## 2017-06-21 NOTE — Op Note (Signed)
Date of Admission: 06/21/2017  Date of Surgery: 06/21/2017  Pre-Op Dx: Cataract Left  Eye  Post-Op Dx: Senile Nuclear Cataract  Left  Eye,  Dx Code H25.12  Surgeon: Tonny Branch, M.D.  Assistants: None  Anesthesia: Topical with MAC  Indications: Painless, progressive loss of vision with compromise of daily activities.  Surgery: Cataract Extraction with Intraocular lens Implant Left Eye  Discription: The patient had dilating drops and viscous lidocaine placed into the Left eye in the pre-op holding area. After transfer to the operating room, a time out was performed. The patient was then prepped and draped. Beginning with a 34m blade a paracentesis port was made at the surgeon's 2 o'clock position. The anterior chamber was then filled with 1% non-preserved lidocaine. This was followed by filling the anterior chamber with Viscoat.  A 2.464mkeratome blade was used to make a clear corneal incision at the temporal limbus.  A bent cystatome needle was used to create a continuous tear capsulotomy. Hydrodissection was performed with balanced salt solution on a Fine canula. The lens nucleus was then removed using the phacoemulsification handpiece. Residual cortex was removed with the I&A handpiece. The anterior chamber and capsular bag were refilled with Provisc. A posterior chamber intraocular lens was placed into the capsular bag with it's injector. The implant was positioned with the Kuglan hook. The Provisc was then removed from the anterior chamber and capsular bag with the I&A handpiece. Stromal hydration of the main incision and paracentesis port was performed with BSS on a Fine canula. The wounds were tested for leak which was negative. The patient tolerated the procedure well. There were no operative complications. The patient was then transferred to the recovery room in stable condition.  Complications: None  Specimen: None  EBL: None  Prosthetic device: Abbott Technis, PCB00, power 22.5, SN  [id number]

## 2017-06-21 NOTE — H&P (Signed)
I have reviewed the H&P, the patient was re-examined, and I have identified no interval changes in medical condition and plan of care since the history and physical of record  

## 2017-07-26 ENCOUNTER — Encounter (HOSPITAL_COMMUNITY): Payer: Self-pay | Admitting: Emergency Medicine

## 2017-07-26 ENCOUNTER — Inpatient Hospital Stay (HOSPITAL_COMMUNITY)
Admission: EM | Admit: 2017-07-26 | Discharge: 2017-07-29 | DRG: 280 | Disposition: A | Discharge status: Home / Self Care | Payer: Medicare Other | Attending: Interventional Cardiology | Admitting: Interventional Cardiology

## 2017-07-26 ENCOUNTER — Emergency Department (HOSPITAL_COMMUNITY): Payer: Medicare Other

## 2017-07-26 DIAGNOSIS — F05 Delirium due to known physiological condition: Secondary | ICD-10-CM | POA: Diagnosis present

## 2017-07-26 DIAGNOSIS — Z9842 Cataract extraction status, left eye: Secondary | ICD-10-CM | POA: Diagnosis not present

## 2017-07-26 DIAGNOSIS — I5021 Acute systolic (congestive) heart failure: Secondary | ICD-10-CM | POA: Diagnosis not present

## 2017-07-26 DIAGNOSIS — I252 Old myocardial infarction: Secondary | ICD-10-CM

## 2017-07-26 DIAGNOSIS — I214 Non-ST elevation (NSTEMI) myocardial infarction: Principal | ICD-10-CM | POA: Diagnosis present

## 2017-07-26 DIAGNOSIS — Z9841 Cataract extraction status, right eye: Secondary | ICD-10-CM

## 2017-07-26 DIAGNOSIS — Z961 Presence of intraocular lens: Secondary | ICD-10-CM | POA: Diagnosis not present

## 2017-07-26 DIAGNOSIS — E1065 Type 1 diabetes mellitus with hyperglycemia: Secondary | ICD-10-CM | POA: Diagnosis not present

## 2017-07-26 DIAGNOSIS — E119 Type 2 diabetes mellitus without complications: Secondary | ICD-10-CM | POA: Diagnosis not present

## 2017-07-26 DIAGNOSIS — E7800 Pure hypercholesterolemia, unspecified: Secondary | ICD-10-CM | POA: Diagnosis not present

## 2017-07-26 DIAGNOSIS — I2721 Secondary pulmonary arterial hypertension: Secondary | ICD-10-CM | POA: Diagnosis not present

## 2017-07-26 DIAGNOSIS — Z886 Allergy status to analgesic agent status: Secondary | ICD-10-CM

## 2017-07-26 DIAGNOSIS — I2510 Atherosclerotic heart disease of native coronary artery without angina pectoris: Secondary | ICD-10-CM | POA: Diagnosis present

## 2017-07-26 DIAGNOSIS — E785 Hyperlipidemia, unspecified: Secondary | ICD-10-CM | POA: Diagnosis present

## 2017-07-26 DIAGNOSIS — I447 Left bundle-branch block, unspecified: Secondary | ICD-10-CM | POA: Diagnosis not present

## 2017-07-26 DIAGNOSIS — Z90710 Acquired absence of both cervix and uterus: Secondary | ICD-10-CM | POA: Diagnosis not present

## 2017-07-26 DIAGNOSIS — I10 Essential (primary) hypertension: Secondary | ICD-10-CM | POA: Diagnosis not present

## 2017-07-26 DIAGNOSIS — I5043 Acute on chronic combined systolic (congestive) and diastolic (congestive) heart failure: Secondary | ICD-10-CM | POA: Diagnosis not present

## 2017-07-26 DIAGNOSIS — R079 Chest pain, unspecified: Secondary | ICD-10-CM | POA: Diagnosis not present

## 2017-07-26 DIAGNOSIS — I110 Hypertensive heart disease with heart failure: Secondary | ICD-10-CM | POA: Diagnosis present

## 2017-07-26 DIAGNOSIS — R0602 Shortness of breath: Secondary | ICD-10-CM | POA: Diagnosis not present

## 2017-07-26 DIAGNOSIS — R071 Chest pain on breathing: Secondary | ICD-10-CM | POA: Diagnosis not present

## 2017-07-26 HISTORY — DX: Unspecified malignant neoplasm of skin, unspecified: C44.90

## 2017-07-26 HISTORY — DX: Unspecified dementia, unspecified severity, without behavioral disturbance, psychotic disturbance, mood disturbance, and anxiety: F03.90

## 2017-07-26 HISTORY — DX: Dyspnea, unspecified: R06.00

## 2017-07-26 HISTORY — DX: Atherosclerotic heart disease of native coronary artery without angina pectoris: I25.10

## 2017-07-26 HISTORY — DX: Unspecified osteoarthritis, unspecified site: M19.90

## 2017-07-26 LAB — COMPREHENSIVE METABOLIC PANEL
ALT: 23 U/L (ref 14–54)
AST: 27 U/L (ref 15–41)
Albumin: 3.7 g/dL (ref 3.5–5.0)
Alkaline Phosphatase: 50 U/L (ref 38–126)
Anion gap: 7 (ref 5–15)
BILIRUBIN TOTAL: 0.5 mg/dL (ref 0.3–1.2)
BUN: 24 mg/dL — AB (ref 6–20)
CALCIUM: 9.4 mg/dL (ref 8.9–10.3)
CO2: 22 mmol/L (ref 22–32)
CREATININE: 1.17 mg/dL — AB (ref 0.44–1.00)
Chloride: 108 mmol/L (ref 101–111)
GFR, EST AFRICAN AMERICAN: 47 mL/min — AB (ref >60)
GFR, EST NON AFRICAN AMERICAN: 41 mL/min — AB (ref >60)
Glucose, Bld: 150 mg/dL — ABNORMAL HIGH (ref 65–99)
Potassium: 4.5 mmol/L (ref 3.5–5.1)
Sodium: 137 mmol/L (ref 135–145)
TOTAL PROTEIN: 6.5 g/dL (ref 6.5–8.1)

## 2017-07-26 LAB — CBC
HCT: 38.1 % (ref 36.0–46.0)
HEMOGLOBIN: 12.3 g/dL (ref 12.0–15.0)
MCH: 30.7 pg (ref 26.0–34.0)
MCHC: 32.3 g/dL (ref 30.0–36.0)
MCV: 95 fL (ref 78.0–100.0)
Platelets: 178 10*3/uL (ref 150–400)
RBC: 4.01 MIL/uL (ref 3.87–5.11)
RDW: 14.3 % (ref 11.5–15.5)
WBC: 9 10*3/uL (ref 4.0–10.5)

## 2017-07-26 LAB — TROPONIN I
TROPONIN I: 0.84 ng/mL — AB (ref <0.03)
Troponin I: 0.31 ng/mL (ref <0.03)

## 2017-07-26 MED ORDER — HEPARIN BOLUS VIA INFUSION
3000.0000 [IU] | Freq: Once | INTRAVENOUS | Status: AC
  Administered 2017-07-26: 3000 [IU] via INTRAVENOUS

## 2017-07-26 MED ORDER — SODIUM CHLORIDE 0.9% FLUSH: 3.0000 mL | INTRAVENOUS | Status: DC | PRN

## 2017-07-26 MED ORDER — SODIUM CHLORIDE 0.9% FLUSH: 3.0000 mL | Freq: Two times a day (BID) | INTRAVENOUS | Status: DC

## 2017-07-26 MED ORDER — SODIUM CHLORIDE 0.9 % IV SOLN: 250.0000 mL | INTRAVENOUS | Status: DC | PRN

## 2017-07-26 MED ORDER — SODIUM CHLORIDE 0.9 % WEIGHT BASED INFUSION
1.0000 mL/kg/h | INTRAVENOUS | Status: DC
  Administered 2017-07-27: 1 mL/kg/h via INTRAVENOUS

## 2017-07-26 MED ORDER — ASPIRIN EC 81 MG PO TBEC: 81.0000 mg | DELAYED_RELEASE_TABLET | Freq: Every day | ORAL | Status: DC

## 2017-07-26 MED ORDER — INSULIN ASPART 100 UNIT/ML ~~LOC~~ SOLN
0.0000 [IU] | Freq: Three times a day (TID) | SUBCUTANEOUS | Status: DC
Start: 2017-07-27 — End: 2017-07-29
  Administered 2017-07-27: 2 [IU] via SUBCUTANEOUS
  Administered 2017-07-27 – 2017-07-28 (×2): 3 [IU] via SUBCUTANEOUS
  Administered 2017-07-29: 2 [IU] via SUBCUTANEOUS

## 2017-07-26 MED ORDER — ASPIRIN 81 MG PO CHEW
243.0000 mg | CHEWABLE_TABLET | Freq: Once | ORAL | Status: DC
  Filled 2017-07-26: qty 3

## 2017-07-26 MED ORDER — ONDANSETRON HCL 4 MG/2ML IJ SOLN: 4.0000 mg | Freq: Four times a day (QID) | INTRAMUSCULAR | Status: DC | PRN

## 2017-07-26 MED ORDER — SODIUM CHLORIDE 0.9 % IV BOLUS (SEPSIS)
500.0000 mL | Freq: Once | INTRAVENOUS | Status: AC
  Administered 2017-07-26: 500 mL via INTRAVENOUS

## 2017-07-26 MED ORDER — ACETAMINOPHEN 325 MG PO TABS: 650.0000 mg | ORAL_TABLET | ORAL | Status: DC | PRN

## 2017-07-26 MED ORDER — ENALAPRIL MALEATE 10 MG PO TABS
10.0000 mg | ORAL_TABLET | Freq: Every day | ORAL | Status: DC
  Administered 2017-07-27: 10 mg via ORAL
  Filled 2017-07-26: qty 1

## 2017-07-26 MED ORDER — ASPIRIN EC 81 MG PO TBEC
81.0000 mg | DELAYED_RELEASE_TABLET | Freq: Every day | ORAL | Status: DC
  Administered 2017-07-27 – 2017-07-29 (×3): 81 mg via ORAL
  Filled 2017-07-26 (×3): qty 1

## 2017-07-26 MED ORDER — DICLOFENAC SODIUM 1 % TD GEL: 2.0000 g | Freq: Every day | TRANSDERMAL | Status: DC | PRN

## 2017-07-26 MED ORDER — SODIUM CHLORIDE 0.9 % WEIGHT BASED INFUSION
3.0000 mL/kg/h | INTRAVENOUS | Status: DC
  Administered 2017-07-27: 3 mL/kg/h via INTRAVENOUS

## 2017-07-26 MED ORDER — CARVEDILOL 25 MG PO TABS
25.0000 mg | ORAL_TABLET | Freq: Two times a day (BID) | ORAL | Status: DC
  Administered 2017-07-27: 25 mg via ORAL
  Filled 2017-07-26: qty 1

## 2017-07-26 MED ORDER — DILTIAZEM HCL 30 MG PO TABS
30.0000 mg | ORAL_TABLET | Freq: Two times a day (BID) | ORAL | Status: DC
  Administered 2017-07-26 – 2017-07-27 (×2): 30 mg via ORAL
  Filled 2017-07-26 (×2): qty 1

## 2017-07-26 MED ORDER — HEPARIN (PORCINE) IN NACL 100-0.45 UNIT/ML-% IJ SOLN
1000.0000 [IU]/h | INTRAMUSCULAR | Status: DC
  Administered 2017-07-26 – 2017-07-27 (×2): 1000 [IU]/h via INTRAVENOUS
  Filled 2017-07-26 (×2): qty 250

## 2017-07-26 MED ORDER — NITROGLYCERIN 0.4 MG SL SUBL
0.4000 mg | SUBLINGUAL_TABLET | SUBLINGUAL | Status: DC | PRN
Start: 2017-07-26 — End: 2017-07-29

## 2017-07-26 MED ORDER — ATORVASTATIN CALCIUM 80 MG PO TABS: 80.0000 mg | ORAL_TABLET | Freq: Every day | ORAL | Status: DC

## 2017-07-26 NOTE — ED Notes (Signed)
Gave EKG to Dr.Long 

## 2017-07-26 NOTE — H&P (Signed)
Primary cardiologist: Dr Sallyanne Kuster Admitting cardiologist: Dr Carlyle Dolly   Clinical Summary Robin Guzman is a 83 y.o.female history fo HTN, PSVT, notes indicate history of CAD unclear details, DM2, chronic LBBB, chronic diastolic HF. From avalable notes appears she had mild CAD by cath in 2007, normal nuclear stress test in 2015  Reports episode of chest pain this AM while driving. Severe pressure midchest with associated SOB. Lasted approximately 30 minutes. Denies any similar symptoms in the past. Currently pain free  Hgb 12.3, Plt 178, K 4.5, Cr 1.17,  Trop 0.31,  CXR possible early pulm edema EKG SR, LBBB, PACs    Allergies  Allergen Reactions  . Daypro [Oxaprozin] Shortness Of Breath and Other (See Comments)    weakness  . Advil [Ibuprofen] Other (See Comments)    weakness    Medications Scheduled Medications:   Infusions: . heparin 1,000 Units/hr (07/26/17 1422)     PRN Medications:     Past Medical History:  Diagnosis Date  . CHF (congestive heart failure) ([place])   . Diabetes mellitus without complication ([place])   . Dyslipidemia   . Dyspnea on exertion   . History of stress test 07/05/2006   High risk scan cardiac cathe was recommended.  [name] Hx of echocardiogram 04/09/2011   EF 55% Mildly hypertrophic left ventricle with normal systolic function, Moderate (grade II) diastolic dysfunction. Elevated left atrial pressure. No significant valvular abnormalities. No pericardial effusion. Mild to moderate pulmonary arterial hypertension.  . Hypercholesteremia   . Hypertension   . Myocardial infarct [facility])     Past Surgical History:  Procedure Laterality Date  . ABDOMINAL HYSTERECTOMY    . CATARACT EXTRACTION W/PHACO Right 05/20/2017   Procedure: CATARACT EXTRACTION PHACO AND INTRAOCULAR LENS PLACEMENT RIGHT EYE;  Surgeon: Tonny Gladyes Kudo, MD;  Location: [facility];  Service: Ophthalmology;  Laterality: Right;  CDE: 17.77  . CATARACT EXTRACTION W/PHACO Left  06/21/2017   Procedure: CATARACT EXTRACTION PHACO AND INTRAOCULAR LENS PLACEMENT (IOC);  Surgeon: Tonny Lawana Hartzell, MD;  Location: [facility];  Service: Ophthalmology;  Laterality: Left;  CDE: 8.90    Family History  Problem Relation Age of Onset  . Cancer Mother   . Heart Problems Father   . Cancer Brother        brain  . Cancer Sister        brain    Social History Robin Guzman reports that she has never smoked. She has never used smokeless tobacco. Robin Guzman reports that she does not drink alcohol.  Review of Systems CONSTITUTIONAL: No weight loss, fever, chills, weakness or fatigue.  HEENT: Eyes: No visual loss, blurred vision, double vision or yellow sclerae. No hearing loss, sneezing, congestion, runny nose or sore throat.  SKIN: No rash or itching.  CARDIOVASCULAR: per hpi RESPIRATORY: per hpi GASTROINTESTINAL: No anorexia, nausea, vomiting or diarrhea. No abdominal pain or blood.  GENITOURINARY: no polyuria, no dysuria NEUROLOGICAL: No headache, dizziness, syncope, paralysis, ataxia, numbness or tingling in the extremities. No change in bowel or bladder control.  MUSCULOSKELETAL: No muscle, back pain, joint pain or stiffness.  HEMATOLOGIC: No anemia, bleeding or bruising.  LYMPHATICS: No enlarged nodes. No history of splenectomy.  PSYCHIATRIC: No history of depression or anxiety.      Physical Examination Blood pressure 130/83, pulse 71, temperature 97.7 F (36.5 C), resp. rate (!) 26, height 5\' 4"  (1.626 m), weight 150 lb (68 kg), SpO2 94 %. No intake or output data in the 24 hours ending 07/26/17 1436  HEENT: sclera clear, throat clear  Cardiovascular: irreg, no m/r/g, no jvd  Respiratory: CTAB  GI: abdomen soft, NT, ND  MSK: no LE edema  Neuro: no focal deficits  Psych: appropriate affect   Lab Results  Basic Metabolic Panel:  Recent Labs Lab 07/26/17 1250  NA 137  K 4.5  CL 108  CO2 22  GLUCOSE 150*  BUN 24*  CREATININE 1.17*  CALCIUM 9.4     Liver Function Tests:  Recent Labs Lab 07/26/17 1250  AST 27  ALT 23  ALKPHOS 50  BILITOT 0.5  PROT 6.5  ALBUMIN 3.7    CBC:  Recent Labs Lab 07/26/17 1247  WBC 9.0  HGB 12.3  HCT 38.1  MCV 95.0  PLT 178    Cardiac Enzymes:  Recent Labs Lab 07/26/17 1247  TROPONINI 0.31*    BNP: Invalid input(s): POCBNP      Impression/Recommendations 1. NSTEMI - acute onset of chest pressure this AM. Troponin elevated to 0.31, has not peaked. EKG chronic LBBB - medical therapy with ASA, beta blocker, ACE-I, heparin gtt. Start high dose statin.  - currently pain free. Transfer to [facility], plan for left heart cath tomorrow  2. DM2 - hold glipizide, cover with SSI with plans to be npo tonight and cath tomorrow   Carlyle Dolly, M.D.

## 2017-07-26 NOTE — ED Notes (Signed)
[facility] has been called.  

## 2017-07-26 NOTE — ED Triage Notes (Signed)
Pt c/o generally not feeling well today with sudden cp and sob x 69min. cbg-355. Pt had 324mg  asa and 1 0.4mg  sl nitro pta.

## 2017-07-26 NOTE — ED Notes (Signed)
CRITICAL VALUE ALERT  Critical Value:  Troponin = 0.31  Date & Time Notied:  07/26/17  Provider Notified: Long  Orders Received/Actions taken:

## 2017-07-26 NOTE — Progress Notes (Signed)
ANTICOAGULATION CONSULT NOTE - Initial Consult  Pharmacy Consult for heparin Indication: chest pain/ACS  Allergies  Allergen Reactions  . Daypro [Oxaprozin] Shortness Of Breath and Other (See Comments)    weakness  . Advil [Ibuprofen] Other (See Comments)    weakness    Patient Measurements: Height: 5\' 4"  (162.6 cm) Weight: 150 lb (68 kg) IBW/kg (Calculated) : 54.7 Heparin Dosing Weight: 68 kg  Vital Signs: Temp: 97.7 F (36.5 C) (09/04 1232) BP: 134/86 (09/04 1300) Pulse Rate: 90 (09/04 1300)  Labs:  Recent Labs  07/26/17 1247 07/26/17 1250  HGB 12.3  --   HCT 38.1  --   PLT 178  --   CREATININE  --  1.17*  TROPONINI 0.31*  --     Estimated Creatinine Clearance: 32.1 mL/min (A) (by C-G formula based on SCr of 1.17 mg/dL (H)).   Medical History: Past Medical History:  Diagnosis Date  . CHF (congestive heart failure) ([place])   . Diabetes mellitus without complication ([place])   . Dyslipidemia   . Dyspnea on exertion   . History of stress test 07/05/2006   High risk scan cardiac cathe was recommended.  [name] Hx of echocardiogram 04/09/2011   EF 55% Mildly hypertrophic left ventricle with normal systolic function, Moderate (grade II) diastolic dysfunction. Elevated left atrial pressure. No significant valvular abnormalities. No pericardial effusion. Mild to moderate pulmonary arterial hypertension.  . Hypercholesteremia   . Hypertension   . Myocardial infarct [facility])     Medications:  See medication history  Assessment: 83 yo lady to start heparin for CP.  She was not on anticoagulation PTA. Goal of Therapy:  Heparin level 0.3-0.7 units/ml Monitor platelets by anticoagulation protocol: Yes   Plan:  Heparin 3000 unit bolus and drip at 1000 units/hr Check heparin level 8 hours after start. Daily HL and CBC while on heparin  Robin Guzman 07/26/2017,1:48 PM

## 2017-07-26 NOTE — ED Provider Notes (Signed)
Emergency Department Provider Note   I have reviewed the triage vital signs and the nursing notes.   HISTORY  Chief Complaint Chest Pain   HPI Robin Guzman is a 83 y.o. female with PMH of CHF, HLD, HTN, and CAD presents to the emergency department for evaluation of sudden onset chest discomfort with shortness of breath. Patient states she felt normal through the morning when symptoms began suddenly. She had some associated nausea. She called family who came to her house and ultimately called EMS. On arrival she was given 324 mg of ASA and SL Nitro which improved pain. No residual symptoms during my evaluation. No fever or chills. No recent medication changes. Family report a distant left heart cath (> 5 years) but deny any stent placement. No radiation of symptoms. No modifying factors. No vomiting and/or diarrhea.    Past Medical History:  Diagnosis Date  . CHF (congestive heart failure) ([place])   . Diabetes mellitus without complication ([place])   . Dyslipidemia   . Dyspnea on exertion   . History of stress test 07/05/2006   High risk scan cardiac cathe was recommended.  [name] Hx of echocardiogram 04/09/2011   EF 55% Mildly hypertrophic left ventricle with normal systolic function, Moderate (grade II) diastolic dysfunction. Elevated left atrial pressure. No significant valvular abnormalities. No pericardial effusion. Mild to moderate pulmonary arterial hypertension.  . Hypercholesteremia   . Hypertension   . Myocardial infarct [facility])     Patient Active Problem List   Diagnosis Date Noted  . Closed 3-part fracture of proximal humerus, left, with routine healing, subsequent encounter 12/02/2016  . UTI (urinary tract infection) 12/25/2013  . Dyspnea 12/25/2013  . Near syncope 12/25/2013  . CAD (coronary artery disease) 12/25/2013  . Chronic diastolic heart failure ([place]) 08/07/2013  . Essential hypertension 08/07/2013  . DM2 (diabetes mellitus, type 2) ([place]) 08/07/2013  .  Hyperlipidemia 08/07/2013    Past Surgical History:  Procedure Laterality Date  . ABDOMINAL HYSTERECTOMY    . CATARACT EXTRACTION W/PHACO Right 05/20/2017   Procedure: CATARACT EXTRACTION PHACO AND INTRAOCULAR LENS PLACEMENT RIGHT EYE;  Surgeon: Tonny Branch, MD;  Location: [facility];  Service: Ophthalmology;  Laterality: Right;  CDE: 17.77  . CATARACT EXTRACTION W/PHACO Left 06/21/2017   Procedure: CATARACT EXTRACTION PHACO AND INTRAOCULAR LENS PLACEMENT (IOC);  Surgeon: Tonny Branch, MD;  Location: [facility];  Service: Ophthalmology;  Laterality: Left;  CDE: 8.90    Current Outpatient Rx  . Order #: [id number] Class: Historical Med  . Order #: [id number] Class: Historical Med  . Order #: [id number] Class: Historical Med  . Order #: [id number] Class: Normal  . Order #: [id number] Class: Historical Med  . Order #: [id number] Class: Historical Med    Allergies Daypro [oxaprozin] and Advil [ibuprofen]  Family History  Problem Relation Age of Onset  . Cancer Mother   . Heart Problems Father   . Cancer Brother        brain  . Cancer Sister        brain    Social History Social History  Substance Use Topics  . Smoking status: Never Smoker  . Smokeless tobacco: Never Used  . Alcohol use No    Review of Systems  Constitutional: No fever/chills Eyes: No visual changes. ENT: No sore throat. Cardiovascular: Positive chest pain. Respiratory: Positive shortness of breath. Gastrointestinal: No abdominal pain. Positive nausea, no vomiting.  No diarrhea.  No constipation. Genitourinary: Negative for dysuria. Musculoskeletal: Negative for back pain. Skin: Negative for rash. Neurological: Negative  for headaches, focal weakness or numbness.  10-point ROS otherwise negative.  ____________________________________________   PHYSICAL EXAM:  VITAL SIGNS: ED Triage Vitals  Enc Vitals Group     BP 07/26/17 1232 (!) 143/98     Pulse Rate 07/26/17 1232 82     Resp 07/26/17 1232 18     Temp 07/26/17  1232 97.7 F (36.5 C)     Temp src --      SpO2 07/26/17 1232 97 %     Weight 07/26/17 1226 150 lb (68 kg)     Height 07/26/17 1226 5\' 4"  (1.626 m)   Constitutional: Alert and oriented. Well appearing and in no acute distress. Hard of hearing.  Eyes: Conjunctivae are normal.  Head: Atraumatic. Nose: No congestion/rhinnorhea. Mouth/Throat: Mucous membranes are moist.  Neck: No stridor.   Cardiovascular: Normal rate, regular rhythm. Good peripheral circulation. Grossly normal heart sounds.   Respiratory: Irregular sinus tachycardia with frequent PACs.  No retractions. Lungs CTAB. Gastrointestinal: Soft and nontender. No distention.  Musculoskeletal: No lower extremity tenderness nor edema. No gross deformities of extremities. Neurologic:  Normal speech and language. No gross focal neurologic deficits are appreciated.  Skin:  Skin is warm, dry and intact. No rash noted.  ____________________________________________   LABS (all labs ordered are listed, but only abnormal results are displayed)  Labs Reviewed  TROPONIN I - Abnormal; Notable for the following:       Result Value   Troponin I 0.31 (*)    All other components within normal limits  COMPREHENSIVE METABOLIC PANEL - Abnormal; Notable for the following:    Glucose, Bld 150 (*)    BUN 24 (*)    Creatinine, Ser 1.17 (*)    GFR calc non Af Amer 41 (*)    GFR calc Af Amer 47 (*)    All other components within normal limits  CBC  HEPARIN LEVEL (UNFRACTIONATED)   ____________________________________________  EKG   EKG Interpretation  Date/Time:  Tuesday July 26 2017 12:37:50 EDT Ventricular Rate:  110 PR Interval:    QRS Duration: 137 QT Interval:  348 QTC Calculation: 399 R Axis:   -51 Text Interpretation:  Sinus tachycardia Atrial premature complexes in couplets Left bundle branch block No STEMI. Similar to prior   Confirmed by Nanda Quinton [phone]) on 07/26/2017 12:44:21 PM        ____________________________________________  RADIOLOGY  Dg Chest 2 View  Result Date: 07/26/2017 CLINICAL DATA:  Sudden onset of chest pain and shortness of breath EXAM: CHEST  2 VIEW COMPARISON:  03/08/2017 FINDINGS: Chronic cardiomegaly. Aortic tortuosity. Mediastinal contours are distorted by rotation. Low volume chest with interstitial crowding. Cannot exclude interstitial opacities and mild edema. No effusion or air bronchogram. Remote proximal left humerus fracture. IMPRESSION: Interstitial coarsening that is likely from low lung volumes. Cannot exclude early pulmonary edema in the appropriate clinical setting. Electronically Signed   By: Monte Fantasia M.D.   On: 07/26/2017 13:19    ____________________________________________   PROCEDURES  Procedure(s) performed:   Procedures  CRITICAL CARE Performed by: Margette Fast Total critical care time: 35 minutes Critical care time was exclusive of separately billable procedures and treating other patients. Critical care was necessary to treat or prevent imminent or life-threatening deterioration. Critical care was time spent personally by me on the following activities: development of treatment plan with patient and/or surrogate as well as nursing, discussions with consultants, evaluation of patient's response to treatment, examination of patient, obtaining history from patient or surrogate,  ordering and performing treatments and interventions, ordering and review of laboratory studies, ordering and review of radiographic studies, pulse oximetry and re-evaluation of patient's condition.  Nanda Quinton, MD Emergency Medicine  ____________________________________________   INITIAL IMPRESSION / ASSESSMENT AND PLAN / ED COURSE  Pertinent labs & imaging results that were available during my care of the patient were reviewed by me and considered in my medical decision making (see chart for details).  Patient resents to the  emergency department for evaluation of chest pain, shortness of breath, fatigue, nausea began suddenly while at home. She was given aspirin and nitroglycerin in route which improved the pain. No active symptoms that the time of my evaluation. No fever or chills. Remainder of exam is unremarkable. Plan for CAD evaluation. EKG abnormal with frequent PACs but underlying rhythm is sinus with no acute ischemic changes.   Troponin elevated to 0.31. No active CP, SOB, or other anginal equivalents at this time. Plan for discussion with Cardiology regarding NSTEMI. Will start Heparin.   Discussed patient's case with Cardiology to request admission. Patient and family (if present) updated with plan. Care transferred to Cardiology service.  I reviewed all nursing notes, vitals, pertinent old records, EKGs, labs, imaging (as available). ____________________________________________  FINAL CLINICAL IMPRESSION(S) / ED DIAGNOSES  Final diagnoses:  NSTEMI (non-ST elevated myocardial infarction) ([place])     MEDICATIONS GIVEN DURING THIS VISIT:  Medications  heparin bolus via infusion 3,000 Units (not administered)  heparin ADULT infusion 100 units/mL (25000 units/262mL sodium chloride 0.45%) (not administered)  sodium chloride 0.9 % bolus 500 mL (500 mLs Intravenous New Bag/Given 07/26/17 1251)   ASA and Nitro given PTA.   NEW OUTPATIENT MEDICATIONS STARTED DURING THIS VISIT:  None   Note:  This document was prepared using Dragon voice recognition software and may include unintentional dictation errors.  Nanda Quinton, MD Emergency Medicine    June Vacha, [facility], MD 07/26/17 [id number]

## 2017-07-27 ENCOUNTER — Encounter (HOSPITAL_COMMUNITY)
Admission: EM | Disposition: A | Discharge status: Home / Self Care | Payer: Self-pay | Source: Home / Self Care | Attending: Cardiology

## 2017-07-27 ENCOUNTER — Encounter (HOSPITAL_COMMUNITY): Payer: Self-pay | Admitting: Cardiovascular Disease

## 2017-07-27 DIAGNOSIS — I2510 Atherosclerotic heart disease of native coronary artery without angina pectoris: Secondary | ICD-10-CM

## 2017-07-27 DIAGNOSIS — I5021 Acute systolic (congestive) heart failure: Secondary | ICD-10-CM

## 2017-07-27 DIAGNOSIS — I5181 Takotsubo syndrome: Secondary | ICD-10-CM

## 2017-07-27 HISTORY — PX: LEFT HEART CATH AND CORONARY ANGIOGRAPHY: CATH118249

## 2017-07-27 LAB — GLUCOSE, CAPILLARY
GLUCOSE-CAPILLARY: 143 mg/dL — AB (ref 65–99)
GLUCOSE-CAPILLARY: 124 mg/dL — AB (ref 65–99)
Glucose-Capillary: 151 mg/dL — ABNORMAL HIGH (ref 65–99)
Glucose-Capillary: 133 mg/dL — ABNORMAL HIGH (ref 65–99)

## 2017-07-27 LAB — PROTIME-INR
INR: 1.11
PROTHROMBIN TIME: 14.2 s (ref 11.4–15.2)

## 2017-07-27 LAB — POCT ACTIVATED CLOTTING TIME: ACTIVATED CLOTTING TIME: 142 s

## 2017-07-27 LAB — HEPARIN LEVEL (UNFRACTIONATED): Heparin Unfractionated: 0.5 IU/mL (ref 0.30–0.70)

## 2017-07-27 SURGERY — LEFT HEART CATH AND CORONARY ANGIOGRAPHY
Anesthesia: LOCAL

## 2017-07-27 MED ORDER — ENOXAPARIN SODIUM 30 MG/0.3ML ~~LOC~~ SOLN: 30.0000 mg | SUBCUTANEOUS | Status: DC

## 2017-07-27 MED ORDER — HEPARIN (PORCINE) IN NACL 100-0.45 UNIT/ML-% IJ SOLN
1000.0000 [IU]/h | INTRAMUSCULAR | Status: DC
  Administered 2017-07-27 (×2): 1000 [IU]/h via INTRAVENOUS

## 2017-07-27 MED ORDER — FENTANYL CITRATE (PF) 100 MCG/2ML IJ SOLN
INTRAMUSCULAR | Status: AC
  Filled 2017-07-27: qty 2

## 2017-07-27 MED ORDER — LIDOCAINE HCL 2 % IJ SOLN
INTRAMUSCULAR | Status: AC
  Filled 2017-07-27: qty 10

## 2017-07-27 MED ORDER — LISINOPRIL 2.5 MG PO TABS
2.5000 mg | ORAL_TABLET | Freq: Every day | ORAL | Status: DC
  Administered 2017-07-27 – 2017-07-29 (×3): 2.5 mg via ORAL
  Filled 2017-07-27 (×3): qty 1

## 2017-07-27 MED ORDER — FENTANYL CITRATE (PF) 100 MCG/2ML IJ SOLN
INTRAMUSCULAR | Status: DC | PRN
  Administered 2017-07-27: 25 ug via INTRAVENOUS

## 2017-07-27 MED ORDER — HEPARIN (PORCINE) IN NACL 2-0.9 UNIT/ML-% IJ SOLN
INTRAMUSCULAR | Status: DC | PRN
  Administered 2017-07-27: 10 mL via INTRA_ARTERIAL

## 2017-07-27 MED ORDER — VERAPAMIL HCL 2.5 MG/ML IV SOLN
INTRAVENOUS | Status: AC
  Filled 2017-07-27: qty 2

## 2017-07-27 MED ORDER — CARVEDILOL 3.125 MG PO TABS
3.1250 mg | ORAL_TABLET | Freq: Two times a day (BID) | ORAL | Status: DC
  Administered 2017-07-27 – 2017-07-29 (×4): 3.125 mg via ORAL
  Filled 2017-07-27 (×4): qty 1

## 2017-07-27 MED ORDER — MIDAZOLAM HCL 2 MG/2ML IJ SOLN
INTRAMUSCULAR | Status: AC
  Filled 2017-07-27: qty 2

## 2017-07-27 MED ORDER — SODIUM CHLORIDE 0.9 % IV SOLN: 250.0000 mL | INTRAVENOUS | Status: DC | PRN

## 2017-07-27 MED ORDER — SODIUM CHLORIDE 0.9% FLUSH: 3.0000 mL | INTRAVENOUS | Status: DC | PRN

## 2017-07-27 MED ORDER — SODIUM CHLORIDE 0.9% FLUSH
3.0000 mL | Freq: Two times a day (BID) | INTRAVENOUS | Status: DC
  Administered 2017-07-27 – 2017-07-29 (×3): 3 mL via INTRAVENOUS

## 2017-07-27 MED ORDER — LIDOCAINE HCL (PF) 1 % IJ SOLN
INTRAMUSCULAR | Status: DC | PRN
  Administered 2017-07-27: 16 mL via INTRADERMAL
  Administered 2017-07-27: 5 mL via INTRADERMAL

## 2017-07-27 MED ORDER — IOPAMIDOL (ISOVUE-370) INJECTION 76%
INTRAVENOUS | Status: AC
  Filled 2017-07-27: qty 100

## 2017-07-27 MED ORDER — HEPARIN (PORCINE) IN NACL 2-0.9 UNIT/ML-% IJ SOLN
INTRAMUSCULAR | Status: AC | PRN
  Administered 2017-07-27: 1000 mL

## 2017-07-27 MED ORDER — IOPAMIDOL (ISOVUE-370) INJECTION 76%
INTRAVENOUS | Status: DC | PRN
  Administered 2017-07-27: 50 mL via INTRAVENOUS

## 2017-07-27 SURGICAL SUPPLY — 19 items
CATH INFINITI 5FR MULTPACK ANG (CATHETERS) ×1 IMPLANT
CATH OPTITORQUE JACKY 4.0 5F (CATHETERS) ×1 IMPLANT
COVER PRB 48X5XTLSCP FOLD TPE (BAG) IMPLANT
COVER PROBE 5X48 (BAG) ×2
DEVICE RAD COMP TR BAND LRG (VASCULAR PRODUCTS) ×1 IMPLANT
GLIDESHEATH SLEND SS 6F .021 (SHEATH) ×1 IMPLANT
GUIDEWIRE ANGLED .035X150CM (WIRE) ×1 IMPLANT
GUIDEWIRE INQWIRE 1.5J.035X260 (WIRE) IMPLANT
INQWIRE 1.5J .035X260CM (WIRE) ×2
KIT ESSENTIALS PG (KITS) ×1 IMPLANT
KIT HEART LEFT (KITS) ×2 IMPLANT
PACK CARDIAC CATHETERIZATION (CUSTOM PROCEDURE TRAY) ×2 IMPLANT
SHEATH PINNACLE 5F 10CM (SHEATH) ×1 IMPLANT
SYR MEDRAD MARK V 150ML (SYRINGE) ×2 IMPLANT
TRANSDUCER W/STOPCOCK (MISCELLANEOUS) ×2 IMPLANT
TUBING CIL FLEX 10 FLL-RA (TUBING) ×2 IMPLANT
TUBING CONTRAST HIGH PRESS 20 (MISCELLANEOUS) ×1 IMPLANT
WIRE EMERALD 3MM-J .035X150CM (WIRE) ×1 IMPLANT
WIRE RUNTHROUGH .014X180CM (WIRE) ×1 IMPLANT

## 2017-07-27 NOTE — Progress Notes (Signed)
Progress Note  Patient Name: Robin Guzman Date of Encounter: 07/27/2017  Primary Cardiologist: Sallyanne Kuster, MD  Subjective   Feels better today. No recurrence of chest discomfort. Has already had coronary angiography performed earlier which did not reveal significant obstructive disease but did identify a pattern consistent with stress cardiomyopathy. LVEDP was elevated.  Inpatient Medications    Scheduled Meds: . aspirin EC  81 mg Oral Daily  . carvedilol  3.125 mg Oral BID WC  . [START ON 07/28/2017] enoxaparin (LOVENOX) injection  30 mg Subcutaneous Q24H  . insulin aspart  0-15 Units Subcutaneous TID WC  . lisinopril  2.5 mg Oral Daily  . sodium chloride flush  3 mL Intravenous Q12H   Continuous Infusions: . sodium chloride     PRN Meds: sodium chloride, acetaminophen, nitroGLYCERIN, ondansetron (ZOFRAN) IV, sodium chloride flush   Vital Signs    Vitals:   07/27/17 1255 07/27/17 1300 07/27/17 1305 07/27/17 1310  BP: (!) 101/53 (!) 107/54 (!) 107/56 (!) 112/52  Pulse: 66 65 70 66  Resp: (!) 21 17 18 19   Temp:      TempSrc:      SpO2: 92% 92% 95% 95%  Weight:      Height:        Intake/Output Summary (Last 24 hours) at 07/27/17 1345 Last data filed at 07/27/17 0700  Gross per 24 hour  Intake               60 ml  Output                0 ml  Net               60 ml   Filed Weights   07/26/17 1226 07/26/17 2023 07/27/17 0704  Weight: 150 lb (68 kg) 153 lb 8 oz (69.6 kg) 152 lb 11.2 oz (69.3 kg)    Telemetry    Sinus rhythm with PACs - Personally Reviewed  ECG    Sinus rhythm with PACs, left bundle branch block, Paul wave progression. - Personally Reviewed  Physical Exam  Frail in appearance consistent with age of 83. GEN: No acute distress.   Neck: No JVD Cardiac: RRR, no murmurs, rubs, or gallops. Cath site is unremarkable for evidence of femoral bleeding or hematoma. Right radial cath site is also unremarkable. Respiratory: Clear to auscultation  bilaterally. GI: Soft, nontender, non-distended  MS: No edema; No deformity. Neuro:  Nonfocal  Psych: Normal affect   Labs    Chemistry Recent Labs Lab 07/26/17 1250  NA 137  K 4.5  CL 108  CO2 22  GLUCOSE 150*  BUN 24*  CREATININE 1.17*  CALCIUM 9.4  PROT 6.5  ALBUMIN 3.7  AST 27  ALT 23  ALKPHOS 50  BILITOT 0.5  GFRNONAA 41*  GFRAA 47*  ANIONGAP 7     Hematology Recent Labs Lab 07/26/17 1247  WBC 9.0  RBC 4.01  HGB 12.3  HCT 38.1  MCV 95.0  MCH 30.7  MCHC 32.3  RDW 14.3  PLT 178    Cardiac Enzymes Recent Labs Lab 07/26/17 1247 07/26/17 1654  TROPONINI 0.31* 0.84*   No results for input(s): TROPIPOC in the last 168 hours.   BNPNo results for input(s): BNP, PROBNP in the last 168 hours.   DDimer No results for input(s): DDIMER in the last 168 hours.   Radiology    Dg Chest 2 View  Result Date: 07/26/2017 CLINICAL DATA:  Sudden onset of chest  pain and shortness of breath EXAM: CHEST  2 VIEW COMPARISON:  03/08/2017 FINDINGS: Chronic cardiomegaly. Aortic tortuosity. Mediastinal contours are distorted by rotation. Low volume chest with interstitial crowding. Cannot exclude interstitial opacities and mild edema. No effusion or air bronchogram. Remote proximal left humerus fracture. IMPRESSION: Interstitial coarsening that is likely from low lung volumes. Cannot exclude early pulmonary edema in the appropriate clinical setting. Electronically Signed   By: Monte Fantasia M.D.   On: 07/26/2017 13:19    Cardiac Studies   Cardiac catheterization 07/27/2017 Conclusion     Mid LAD lesion, 30 %stenosed.  Ost 1st Diag to 1st Diag lesion, 40 %stenosed.  Mid LAD to Dist LAD lesion, 20 %stenosed.  There is severe left ventricular systolic dysfunction.  LV end diastolic pressure is moderately elevated.  The left ventricular ejection fraction is 25-35% by visual estimate.   1. Mild nonobstructive coronary artery disease. 2. Severely reduced LV systolic  function with an EF of 25-30% with wall motion abnormality consistent with stress-induced cardiomyopathy. 3. Moderately elevated left ventricular end-diastolic pressure.   Diagnostic Diagram         Patient Profile     83 y.o. female with history of hypertension, PSVT, nonobstructive coronary disease, diabetes, chronic left bundle, and chronic diastolic heart failure presents with chest pain and mildly elevated troponin levels.  Assessment & Plan    1. Non-ST elevation acute coronary syndrome with catheterization demonstrating widely patent coronary arteries and wall motion abnormality consistent with Stress Cardiomyopathy. Plan guideline directed therapy of left ventricular systolic dysfunction as tolerated by blood pressure to include beta blocker and angiotensin-converting enzyme inhibitor/ARB. Diuretic therapy as tolerated. Continue full dose anticoagulation for an additional 24-48 hours. Will need phase II cardiac rehabilitation. 2. Acute systolic and diastolic heart failure secondary to stress cardiomyopathy. Diuresis as tolerated. 3. Nonobstructive coronary disease 4. Hypertension with relatively low blood pressures currently, related to acute LV ischemic event.  Signed, Sinclair Grooms, MD  07/27/2017, 1:45 PM

## 2017-07-27 NOTE — Progress Notes (Signed)
TR band removed at 1611. A 2x2 guaze with tegaderm was applied. Site remains level 0. R pulses remain 2+, extremity is warm to touch, normal color for ethnicity. Vital signs stable. Pt instructed to not put any pressure or use arm. Pt instructed to call if arm begins to bleed. Pt verbalized understanding. Will continue to monitor pt.  Clarise Cruz RN

## 2017-07-27 NOTE — Progress Notes (Signed)
[place] for heparin Indication: chest pain/ACS  Allergies  Allergen Reactions  . Daypro [Oxaprozin] Shortness Of Breath and Other (See Comments)    weakness  . Advil [Ibuprofen] Other (See Comments)    weakness    Patient Measurements: Height: 5' (152.4 cm) Weight: 153 lb 8 oz (69.6 kg) (a scale) IBW/kg (Calculated) : 45.5 Heparin Dosing Weight: 68 kg  Vital Signs: Temp: 97.8 F (36.6 C) (09/04 2023) Temp Source: Oral (09/04 2023) BP: 151/87 (09/04 2023) Pulse Rate: 93 (09/04 2023)  Labs:  Recent Labs  07/26/17 1247 07/26/17 1250 07/26/17 1654 07/27/17 0039  HGB 12.3  --   --   --   HCT 38.1  --   --   --   PLT 178  --   --   --   LABPROT  --   --   --  14.2  INR  --   --   --  1.11  HEPARINUNFRC  --   --   --  0.50  CREATININE  --  1.17*  --   --   TROPONINI 0.31*  --  0.84*  --     Estimated Creatinine Clearance: 29.5 mL/min (A) (by C-G formula based on SCr of 1.17 mg/dL (H)).  Assessment: 83 y.o. female with chest pain for heparin  Goal of Therapy:  Heparin level 0.3-0.7 units/ml Monitor platelets by anticoagulation protocol: Yes   Plan:  .Continue Heparin at current rate Follow up after cath today   Stone Spirito, Bronson Curb 07/27/2017,1:41 AM

## 2017-07-27 NOTE — Progress Notes (Signed)
Report and Care received from [facility]. Patient resting comfortably at this time. VSS. 48F sheath in Rt Femoral Artery. TR band to Rt Radial site with 14cc of air. Rt/Lt DP pulses palpable. Will continue to monitor patient.

## 2017-07-27 NOTE — Progress Notes (Signed)
67F sheath removed from Rt femoral artery. Manual pressure held for 30 min. Hemostasis achieved. VSS. 4x4 and tegaderm applied to Rt femoral site. Site soft with no active bleeding or hematoma noted. Post procedure bleeding precautions reviewed with patient. TR band remains on Rt Radial site. 8cc of air left in band. Site soft with no active bleeding or hematoma noted. Patient resting comfortably at this time with no complaints. Rt/Lt DP palpable. Will continue to monitor patient.

## 2017-07-27 NOTE — Progress Notes (Signed)
Patient to be transferred back to room at this time. Rt groin and Rt radial remain stable with no active bleeding or hematoma noted.

## 2017-07-27 NOTE — Progress Notes (Signed)
[place] for heparin Indication: chest pain/ACS  Allergies  Allergen Reactions  . Daypro [Oxaprozin] Shortness Of Breath and Other (See Comments)    weakness  . Advil [Ibuprofen] Other (See Comments)    weakness    Patient Measurements: Height: 5' (152.4 cm) Weight: 152 lb 11.2 oz (69.3 kg) (a scale) IBW/kg (Calculated) : 45.5 Heparin Dosing Weight: 68 kg  Vital Signs: Temp: 98 F (36.7 C) (09/05 0900) Temp Source: Oral (09/05 0900) BP: 112/52 (09/05 1310) Pulse Rate: 66 (09/05 1310)  Labs:  Recent Labs  07/26/17 1247 07/26/17 1250 07/26/17 1654 07/27/17 0039  HGB 12.3  --   --   --   HCT 38.1  --   --   --   PLT 178  --   --   --   LABPROT  --   --   --  14.2  INR  --   --   --  1.11  HEPARINUNFRC  --   --   --  0.50  CREATININE  --  1.17*  --   --   TROPONINI 0.31*  --  0.84*  --     Estimated Creatinine Clearance: 29.4 mL/min (A) (by C-G formula based on SCr of 1.17 mg/dL (H)).   Medical History: Past Medical History:  Diagnosis Date  . CHF (congestive heart failure) ([place])   . Diabetes mellitus without complication ([place])   . Dyslipidemia   . Dyspnea on exertion   . History of stress test 07/05/2006   High risk scan cardiac cathe was recommended.  [name] Hx of echocardiogram 04/09/2011   EF 55% Mildly hypertrophic left ventricle with normal systolic function, Moderate (grade II) diastolic dysfunction. Elevated left atrial pressure. No significant valvular abnormalities. No pericardial effusion. Mild to moderate pulmonary arterial hypertension.  . Hypercholesteremia   . Hypertension   . Myocardial infarct (H[facility])     Medications:  See medication history  Assessment: 63 yoF s/p cardiac catheterization to start heparin infusion to prevention of LV apical thrombus. CBC stable with morning labs. Pharmacy will resume heparin 8 hours from sheath removal.   Goal of Therapy:  Heparin level 0.3-0.7 units/ml Monitor  platelets by anticoagulation protocol: Yes   Plan:  Restart heparin gtt at 1000 units/hr Check heparin level 8 hours after start. Daily heparin level and CBC while on heparin  Hurman Ketelsen L Shaniya Tashiro 07/27/2017,2:47 PM

## 2017-07-27 NOTE — Interval H&P Note (Signed)
Cath Lab Visit (complete for each Cath Lab visit)  Clinical Evaluation Leading to the Procedure:   ACS: Yes.    Non-ACS:  n/a  History and Physical Interval Note:  07/27/2017 10:55 AM  Robin Guzman  has presented today for surgery, with the diagnosis of nstemi  The various methods of treatment have been discussed with the patient and family. After consideration of risks, benefits and other options for treatment, the patient has consented to  Procedure(s): LEFT HEART CATH AND CORONARY ANGIOGRAPHY (N/A) as a surgical intervention .  The patient's history has been reviewed, patient examined, no change in status, stable for surgery.  I have reviewed the patient's chart and labs.  Questions were answered to the patient's satisfaction.     Kathlyn Sacramento

## 2017-07-28 ENCOUNTER — Encounter (HOSPITAL_COMMUNITY): Payer: Self-pay | Admitting: General Practice

## 2017-07-28 DIAGNOSIS — I5021 Acute systolic (congestive) heart failure: Secondary | ICD-10-CM

## 2017-07-28 DIAGNOSIS — F05 Delirium due to known physiological condition: Secondary | ICD-10-CM

## 2017-07-28 DIAGNOSIS — I10 Essential (primary) hypertension: Secondary | ICD-10-CM

## 2017-07-28 LAB — CBC
HEMATOCRIT: 36.9 % (ref 36.0–46.0)
Hemoglobin: 12.1 g/dL (ref 12.0–15.0)
MCH: 30.6 pg (ref 26.0–34.0)
MCHC: 32.8 g/dL (ref 30.0–36.0)
MCV: 93.4 fL (ref 78.0–100.0)
Platelets: 157 10*3/uL (ref 150–400)
RBC: 3.95 MIL/uL (ref 3.87–5.11)
RDW: 14.8 % (ref 11.5–15.5)
WBC: 8.5 10*3/uL (ref 4.0–10.5)

## 2017-07-28 LAB — BASIC METABOLIC PANEL
ANION GAP: 8 (ref 5–15)
BUN: 16 mg/dL (ref 6–20)
CO2: 23 mmol/L (ref 22–32)
Calcium: 8.8 mg/dL — ABNORMAL LOW (ref 8.9–10.3)
Chloride: 104 mmol/L (ref 101–111)
Creatinine, Ser: 1.23 mg/dL — ABNORMAL HIGH (ref 0.44–1.00)
GFR calc Af Amer: 44 mL/min — ABNORMAL LOW (ref >60)
GFR, EST NON AFRICAN AMERICAN: 38 mL/min — AB (ref >60)
GLUCOSE: 160 mg/dL — AB (ref 65–99)
POTASSIUM: 4.1 mmol/L (ref 3.5–5.1)
Sodium: 135 mmol/L (ref 135–145)

## 2017-07-28 LAB — GLUCOSE, CAPILLARY
GLUCOSE-CAPILLARY: 121 mg/dL — AB (ref 65–99)
GLUCOSE-CAPILLARY: 182 mg/dL — AB (ref 65–99)
GLUCOSE-CAPILLARY: 133 mg/dL — AB (ref 65–99)
Glucose-Capillary: 78 mg/dL (ref 65–99)

## 2017-07-28 LAB — HEPARIN LEVEL (UNFRACTIONATED): Heparin Unfractionated: <0.1 IU/mL — ABNORMAL LOW (ref 0.30–0.70)

## 2017-07-28 MED ORDER — HEPARIN SODIUM (PORCINE) 5000 UNIT/ML IJ SOLN
5000.0000 [IU] | Freq: Three times a day (TID) | INTRAMUSCULAR | Status: DC
  Administered 2017-07-28 – 2017-07-29 (×2): 5000 [IU] via SUBCUTANEOUS
  Filled 2017-07-28 (×2): qty 1

## 2017-07-28 MED ORDER — LORAZEPAM 2 MG/ML IJ SOLN
0.5000 mg | Freq: Once | INTRAMUSCULAR | Status: AC
  Administered 2017-07-28: 0.5 mg via INTRAVENOUS
  Filled 2017-07-28: qty 1
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
## 2017-07-28 NOTE — Progress Notes (Signed)
Progress Note  Patient Name: Robin Guzman Date of Encounter: 07/28/2017  Primary Cardiologist: Croitoru  Subjective   Difficult night because of confusion and agitation. Received Ativan and now sleeping. Daughter is at bedside. Dyspnea has improved. No chest pain.  Inpatient Medications    Scheduled Meds: . aspirin EC  81 mg Oral Daily  . carvedilol  3.125 mg Oral BID WC  . insulin aspart  0-15 Units Subcutaneous TID WC  . lisinopril  2.5 mg Oral Daily  . sodium chloride flush  3 mL Intravenous Q12H   Continuous Infusions: . sodium chloride    . heparin 1,000 Units/hr (07/28/17 0310)   PRN Meds: sodium chloride, acetaminophen, nitroGLYCERIN, ondansetron (ZOFRAN) IV, sodium chloride flush   Vital Signs    Vitals:   07/27/17 2044 07/28/17 0051 07/28/17 0400 07/28/17 0635  BP: 109/66 (!) 143/83  91/62  Pulse: 79 (!) 103 86 80  Resp: 18 18  16   Temp: 97.8 F (36.6 C) 98.3 F (36.8 C)  98.1 F (36.7 C)  TempSrc: Oral Oral  Oral  SpO2: 98% 96% 100% 96%  Weight:      Height:        Intake/Output Summary (Last 24 hours) at 07/28/17 1014 Last data filed at 07/28/17 0900  Gross per 24 hour  Intake              240 ml  Output              300 ml  Net              -60 ml   Filed Weights   07/26/17 1226 07/26/17 2023 07/27/17 0704  Weight: 150 lb (68 kg) 153 lb 8 oz (69.6 kg) 152 lb 11.2 oz (69.3 kg)    Telemetry    Normal sinus rhythm - Personally Reviewed  ECG    Left bundle branch block, sinus rhythm with PACs, - Personally Reviewed  Physical Exam  Elderly, asleep but arouses. GEN: No acute distress.   Neck: No JVD Cardiac: RRR, no murmurs, rubs, or gallops.  Respiratory: Clear to auscultation bilaterally. GI: Soft, nontender, non-distended  MS: No edema; No deformity. Neuro:   unable to hear without hearing aids. Psych: Agitation and confusion last p.m.  Labs    Chemistry Recent Labs Lab 07/26/17 1250  NA 137  K 4.5  CL 108  CO2 22    GLUCOSE 150*  BUN 24*  CREATININE 1.17*  CALCIUM 9.4  PROT 6.5  ALBUMIN 3.7  AST 27  ALT 23  ALKPHOS 50  BILITOT 0.5  GFRNONAA 41*  GFRAA 47*  ANIONGAP 7     Hematology Recent Labs Lab 07/26/17 1247  WBC 9.0  RBC 4.01  HGB 12.3  HCT 38.1  MCV 95.0  MCH 30.7  MCHC 32.3  RDW 14.3  PLT 178    Cardiac Enzymes Recent Labs Lab 07/26/17 1247 07/26/17 1654  TROPONINI 0.31* 0.84*   No results for input(s): TROPIPOC in the last 168 hours.   BNPNo results for input(s): BNP, PROBNP in the last 168 hours.   DDimer No results for input(s): DDIMER in the last 168 hours.   Radiology    Dg Chest 2 View  Result Date: 07/26/2017 CLINICAL DATA:  Sudden onset of chest pain and shortness of breath EXAM: CHEST  2 VIEW COMPARISON:  03/08/2017 FINDINGS: Chronic cardiomegaly. Aortic tortuosity. Mediastinal contours are distorted by rotation. Low volume chest with interstitial crowding. Cannot exclude interstitial opacities  and mild edema. No effusion or air bronchogram. Remote proximal left humerus fracture. IMPRESSION: Interstitial coarsening that is likely from low lung volumes. Cannot exclude early pulmonary edema in the appropriate clinical setting. Electronically Signed   By: Monte Fantasia M.D.   On: 07/26/2017 13:19    Cardiac Studies   Cardiac catheterization 07/27/2017: Conclusion     Mid LAD lesion, 30 %stenosed.  Ost 1st Diag to 1st Diag lesion, 40 %stenosed.  Mid LAD to Dist LAD lesion, 20 %stenosed.  There is severe left ventricular systolic dysfunction.  LV end diastolic pressure is moderately elevated.  The left ventricular ejection fraction is 25-35% by visual estimate.  1. Mild nonobstructive coronary artery disease. 2. Severely reduced LV systolic function with an EF of 25-30% with wall motion abnormality consistent with stress-induced cardiomyopathy. 3. Moderately elevated left ventricular end-diastolic pressure.      Patient Profile     83  y.o. female with history of hypertension, PSVT, nonobstructive coronary disease, diabetes, chronic left bundle, and chronic diastolic heart failure presents with chest pain and mildly elevated troponin levels.Cardiac catheterization demonstrated widely patent coronary arteries and suggested the diagnosis of stress cardiomyopathy as the etiology for acute coronary syndrome. Acute systolic heart failure also detected based on LVEDP elevation.  Assessment & Plan    1. Acute coronary syndrome likely all related to stress cardiomyopathy. Coronary angina did not reveal any significant obstructive coronary disease. Stop IV heparin. Subcutaneous Lovenox. Ambulate today. Likely discharge in a.m., or later today depending upon how she is doing after she awakens from her nap 2. Acute systolic heart failure without signs or symptoms of volume overload currently. 3. Confusion related to age and probable sundowning  Signed, Sinclair Grooms, MD  07/28/2017, 10:14 AM

## 2017-07-29 ENCOUNTER — Encounter (HOSPITAL_COMMUNITY): Payer: Self-pay | Admitting: Cardiology

## 2017-07-29 LAB — GLUCOSE, CAPILLARY
Glucose-Capillary: 124 mg/dL — ABNORMAL HIGH (ref 65–99)
Glucose-Capillary: 106 mg/dL — ABNORMAL HIGH (ref 65–99)

## 2017-07-29 LAB — CBC
HEMATOCRIT: 33.7 % — AB (ref 36.0–46.0)
Hemoglobin: 10.8 g/dL — ABNORMAL LOW (ref 12.0–15.0)
MCH: 30.1 pg (ref 26.0–34.0)
MCHC: 32 g/dL (ref 30.0–36.0)
MCV: 93.9 fL (ref 78.0–100.0)
Platelets: 143 10*3/uL — ABNORMAL LOW (ref 150–400)
RBC: 3.59 MIL/uL — ABNORMAL LOW (ref 3.87–5.11)
RDW: 14.6 % (ref 11.5–15.5)
WBC: 6.4 10*3/uL (ref 4.0–10.5)

## 2017-07-29 MED ORDER — CARVEDILOL 3.125 MG PO TABS: 3.1250 mg | ORAL_TABLET | Freq: Two times a day (BID) | ORAL | 2 refills | Status: DC

## 2017-07-29 MED ORDER — LISINOPRIL 2.5 MG PO TABS: 2.5000 mg | ORAL_TABLET | Freq: Every day | ORAL | 2 refills | Status: DC

## 2017-07-29 NOTE — Progress Notes (Signed)
Pt is being d/c and she stated that she will take her bath when she gets home

## 2017-07-29 NOTE — Progress Notes (Signed)
Pt has orders to be discharged. Discharge instructions given and pt has no additional questions at this time. Medication regimen reviewed and pt educated. Pt verbalized understanding and has no additional questions. Telemetry box removed. IV removed and site in good condition. Pt stable and waiting for transportation.  Kanden Carey RN 

## 2017-07-29 NOTE — Discharge Summary (Addendum)
Discharge Summary    Patient ID: Robin Guzman,  MRN: [id number], DOB/AGE: 1928/10/20 83 y.o.  Admit date: 07/26/2017 Discharge date: 07/29/2017  Primary Care Provider: Celene Squibb Primary Cardiologist: Croitoru/Lawrence Linna Hoff)  Discharge Diagnoses    Active Problems:   Essential hypertension   CAD (coronary artery disease)   NSTEMI (non-ST elevated myocardial infarction) ([facility])   Acute systolic heart failure (HCC)   Sundowning   Allergies Allergies  Allergen Reactions  . Daypro [Oxaprozin] Shortness Of Breath and Other (See Comments)    weakness  . Advil [Ibuprofen] Other (See Comments)    weakness    Diagnostic Studies/Procedures    LHC: 07/27/17  Conclusion     Mid LAD lesion, 30 %stenosed.  Ost 1st Diag to 1st Diag lesion, 40 %stenosed.  Mid LAD to Dist LAD lesion, 20 %stenosed.  There is severe left ventricular systolic dysfunction.  LV end diastolic pressure is moderately elevated.  The left ventricular ejection fraction is 25-35% by visual estimate.   1. Mild nonobstructive coronary artery disease. 2. Severely reduced LV systolic function with an EF of 25-30% with wall motion abnormality consistent with stress-induced cardiomyopathy. 3. Moderately elevated left ventricular end-diastolic pressure.  Recommendations: Recommend medical therapy for stress-induced cardiomyopathy. I resumed small dose carvedilol and ACE inhibitor. If renal function is stable, consider gentle diuresis. Avoid catheterization via the right radial artery in the future due to significant right radial loop that could not be straightened out with the catheter.   _____________   History of Present Illness     83 yo female with PMH of HTN, PSVT, DM, chronic LBBB, chronic diastolic HF who presented with chest pain to [facility] ED. Troponin was noted to be elevated at 0.31 and given symptoms was transferred to [facility] for further work up and cardiac cath.   Hospital Course        Underwent cardiac cath with Dr. Fletcher Anon noted above with non obstructive CAD, but significantly reduced EF at 25-35% on LV gram. Pattern was felt to be consistent with stress cardiomyopathy and had an elevated LVEDP. She was started on GDT with metoprolol and Lisinopril post cath. Also continued on IV heparin for 48 hours post cath. No s/s of volume overloaded noted this admission. Labs were stable with the addition of medications post cath. Will need an echocardiogram done in 6-8 weeks to re-documented LV function. Anticipated that LV function will improve. No driving. Plan to stay with family for at least 7-10 days before attempting to reside at home independently.  Robin Guzman was seen by Dr. Tamala Julian and determined stable for discharge home. Follow up in the office has been arranged. Medications are listed below. Plan for BMET at follow up appt.   _____________  Discharge Vitals Blood pressure 126/66, pulse 75, temperature 97.8 F (36.6 C), temperature source Oral, resp. rate 18, height 5' (1.524 m), weight 152 lb 4.8 oz (69.1 kg), SpO2 97 %.  Filed Weights   07/26/17 2023 07/27/17 0704 07/29/17 0424  Weight: 153 lb 8 oz (69.6 kg) 152 lb 11.2 oz (69.3 kg) 152 lb 4.8 oz (69.1 kg)    Labs & Radiologic Studies    CBC  Recent Labs  07/28/17 1038 07/29/17 0421  WBC 8.5 6.4  HGB 12.1 10.8*  HCT 36.9 33.7*  MCV 93.4 93.9  PLT 157 660*   Basic Metabolic Panel  Recent Labs  07/26/17 1250 07/28/17 1038  NA 137 135  K 4.5 4.1  CL 108  104  CO2 22 23  GLUCOSE 150* 160*  BUN 24* 16  CREATININE 1.17* 1.23*  CALCIUM 9.4 8.8*   Liver Function Tests  Recent Labs  07/26/17 1250  AST 27  ALT 23  ALKPHOS 50  BILITOT 0.5  PROT 6.5  ALBUMIN 3.7   No results for input(s): LIPASE, AMYLASE in the last 72 hours. Cardiac Enzymes  Recent Labs  07/26/17 1247 07/26/17 1654  TROPONINI 0.31* 0.84*   BNP Invalid input(s): POCBNP D-Dimer No results for input(s): DDIMER in the  last 72 hours. Hemoglobin A1C No results for input(s): HGBA1C in the last 72 hours. Fasting Lipid Panel No results for input(s): CHOL, HDL, LDLCALC, TRIG, CHOLHDL, LDLDIRECT in the last 72 hours. Thyroid Function Tests No results for input(s): TSH, T4TOTAL, T3FREE, THYROIDAB in the last 72 hours.  Invalid input(s): FREET3 _____________  Dg Chest 2 View  Result Date: 07/26/2017 CLINICAL DATA:  Sudden onset of chest pain and shortness of breath EXAM: CHEST  2 VIEW COMPARISON:  03/08/2017 FINDINGS: Chronic cardiomegaly. Aortic tortuosity. Mediastinal contours are distorted by rotation. Low volume chest with interstitial crowding. Cannot exclude interstitial opacities and mild edema. No effusion or air bronchogram. Remote proximal left humerus fracture. IMPRESSION: Interstitial coarsening that is likely from low lung volumes. Cannot exclude early pulmonary edema in the appropriate clinical setting. Electronically Signed   By: Monte Fantasia M.D.   On: 07/26/2017 13:19   Disposition   Pt is being discharged home today in good condition.  Follow-up Plans & Appointments    Follow-up Information    Lendon Colonel, NP Follow up on 08/04/2017.   Specialties:  Nurse Practitioner, Radiology, Cardiology Why:  at 3pm for your follow up appt.  Contact information: [place] [id number] [phone]          Discharge Instructions    (HEART FAILURE PATIENTS) Call MD:  Anytime you have any of the following symptoms: 1) 3 pound weight gain in 24 hours or 5 pounds in 1 week 2) shortness of breath, with or without a dry hacking cough 3) swelling in the hands, feet or stomach 4) if you have to sleep on extra pillows at night in order to breathe.    Complete by:  As directed    Diet - low sodium heart healthy    Complete by:  As directed    Discharge instructions    Complete by:  As directed    Groin Site Care Refer to this sheet in the next few weeks. These instructions provide  you with information on caring for yourself after your procedure. Your caregiver may also give you more specific instructions. Your treatment has been planned according to current medical practices, but problems sometimes occur. Call your caregiver if you have any problems or questions after your procedure. HOME CARE INSTRUCTIONS You may shower 24 hours after the procedure. Remove the bandage (dressing) and gently wash the site with plain soap and water. Gently pat the site dry.  Do not apply powder or lotion to the site.  Do not sit in a bathtub, swimming pool, or whirlpool for 5 to 7 days.  No bending, squatting, or lifting anything over 10 pounds (4.5 kg) as directed by your caregiver.  Inspect the site at least twice daily.  Do not drive home if you are discharged the same day of the procedure. Have someone else drive you.  You may drive 24 hours after the procedure unless otherwise instructed by your  caregiver.  What to expect: Any bruising will usually fade within 1 to 2 weeks.  Blood that collects in the tissue (hematoma) may be painful to the touch. It should usually decrease in size and tenderness within 1 to 2 weeks.  SEEK IMMEDIATE MEDICAL CARE IF: You have unusual pain at the groin site or down the affected leg.  You have redness, warmth, swelling, or pain at the groin site.  You have drainage (other than a small amount of blood on the dressing).  You have chills.  You have a fever or persistent symptoms for more than 72 hours.  You have a fever and your symptoms suddenly get worse.  Your leg becomes pale, cool, tingly, or numb.  You have heavy bleeding from the site. Hold pressure on the site. .   Increase activity slowly    Complete by:  As directed       Discharge Medications     Medication List    STOP taking these medications   diltiazem 30 MG tablet Commonly known as:  CARDIZEM   enalapril 20 MG tablet Commonly known as:  VASOTEC     TAKE these medications     aspirin EC 81 MG tablet Take 81 mg by mouth daily.   carvedilol 3.125 MG tablet Commonly known as:  COREG Take 1 tablet (3.125 mg total) by mouth 2 (two) times daily with a meal. What changed:  medication strength  how much to take   diclofenac sodium 1 % Gel Commonly known as:  VOLTAREN Apply 2 g topically daily as needed (pain).   glipiZIDE 5 MG tablet Commonly known as:  GLUCOTROL Take 5 mg by mouth 2 (two) times daily before a meal.   lisinopril 2.5 MG tablet Commonly known as:  PRINIVIL,ZESTRIL Take 1 tablet (2.5 mg total) by mouth daily.          Outstanding Labs/Studies   BMET at follow up, and echo in 6-8 weeks for LV function.   Duration of Discharge Encounter   Greater than 30 minutes including physician time.  Signed, Reino Bellis NP-C 07/29/2017, 10:59 AM    The patient has been seen in conjunction with Reino Bellis, [facility]. All aspects of care have been considered and discussed. The patient has been personally interviewed, examined, and all clinical data has been reviewed.  1. Stress cardiomyopathy with acute systolic heart failure, resolving on current medical regimen including low-dose beta blocker and ACE 2. Acute systolic heart failure with no clinical shortness of breath or evidence of volume overload on exam today. She is currently on guideline based therapy including an ARB and beta blocker. 3. Normotensive on current medical regimen 4. Nonobstructive coronary disease 5. Sundowning resolved.   Plan discharge today on low-dose therapy as mentioned above. Needs transition of care follow-up in 7-10 days. A basic metabolic panel should be performed at that time. Will need an echocardiogram done in 6-8 weeks to re-documented LV function. Anticipated that LV function will improve. No driving. Should stay with family for at least 7-10 days before attempting to reside at home independently.

## 2017-07-29 NOTE — Progress Notes (Addendum)
Progress Note  Patient Name: Robin Guzman Date of Encounter: 07/29/2017  Primary Cardiologist: Croitoru  Subjective   Better night. Slept well. Sitting at bedside. No dyspnea. No chest pain. Hopeful that she will be allowed to go home.  Inpatient Medications    Scheduled Meds: . aspirin EC  81 mg Oral Daily  . carvedilol  3.125 mg Oral BID WC  . heparin subcutaneous  5,000 Units Subcutaneous Q8H  . insulin aspart  0-15 Units Subcutaneous TID WC  . lisinopril  2.5 mg Oral Daily  . sodium chloride flush  3 mL Intravenous Q12H   Continuous Infusions: . sodium chloride     PRN Meds: sodium chloride, acetaminophen, nitroGLYCERIN, ondansetron (ZOFRAN) IV, sodium chloride flush   Vital Signs    Vitals:   07/28/17 0635 07/28/17 1151 07/28/17 2116 07/29/17 0424  BP: 91/62 (!) 100/50 113/62 126/66  Pulse: 80 75 80 75  Resp: 16  18 18   Temp: 98.1 F (36.7 C)  98.5 F (36.9 C) 97.8 F (36.6 C)  TempSrc: Oral  Oral Oral  SpO2: 96% 98% 96% 97%  Weight:    152 lb 4.8 oz (69.1 kg)  Height:        Intake/Output Summary (Last 24 hours) at 07/29/17 0941 Last data filed at 07/29/17 0903  Gross per 24 hour  Intake              360 ml  Output                0 ml  Net              360 ml   Filed Weights   07/26/17 2023 07/27/17 0704 07/29/17 0424  Weight: 153 lb 8 oz (69.6 kg) 152 lb 11.2 oz (69.3 kg) 152 lb 4.8 oz (69.1 kg)    Telemetry    Normal sinus rhythm without ectopy - Personally Reviewed  ECG    Normal sinus rhythm, left bundle branch block, no change compared to the prior tracing on 07/27/2017 - Personally Reviewed  Physical Exam  Elderly, sitting, in no distress. GEN: No acute distress.   Neck: No JVD Cardiac: RRR, no murmurs, rubs, or gallops.  Respiratory: Clear to auscultation bilaterally. GI: Soft, nontender, non-distended  MS: No edema; No deformity. Neuro:   decreased hearing Psych: Normal affect   Labs    Chemistry Recent Labs Lab  07/26/17 1250 07/28/17 1038  NA 137 135  K 4.5 4.1  CL 108 104  CO2 22 23  GLUCOSE 150* 160*  BUN 24* 16  CREATININE 1.17* 1.23*  CALCIUM 9.4 8.8*  PROT 6.5  --   ALBUMIN 3.7  --   AST 27  --   ALT 23  --   ALKPHOS 50  --   BILITOT 0.5  --   GFRNONAA 41* 38*  GFRAA 47* 44*  ANIONGAP 7 8     Hematology Recent Labs Lab 07/26/17 1247 07/28/17 1038 07/29/17 0421  WBC 9.0 8.5 6.4  RBC 4.01 3.95 3.59*  HGB 12.3 12.1 10.8*  HCT 38.1 36.9 33.7*  MCV 95.0 93.4 93.9  MCH 30.7 30.6 30.1  MCHC 32.3 32.8 32.0  RDW 14.3 14.8 14.6  PLT 178 157 143*    Cardiac Enzymes Recent Labs Lab 07/26/17 1247 07/26/17 1654  TROPONINI 0.31* 0.84*   No results for input(s): TROPIPOC in the last 168 hours.   BNPNo results for input(s): BNP, PROBNP in the last 168 hours.  DDimer No results for input(s): DDIMER in the last 168 hours.   Radiology    No results found.  Cardiac Studies   Apical ballooning noted on ventriculography, EF 25-30%. Nonobstructive coronary disease. Elevated EDP.  Patient Profile     83 y.o. female with history of hypertension, PSVT, nonobstructive coronary disease, diabetes, chronic left bundle, and chronic diastolic heart failure presents with chest pain and mildly elevated troponin levels.Cardiac catheterization demonstrated widely patent coronary arteries and suggested the diagnosis of stress cardiomyopathy as the etiology for acute coronary syndrome. Acute systolic heart failure also detected based on LVEDP elevation.  Assessment & Plan    1. Stress cardiomyopathy with acute systolic heart failure, resolving on current medical regimen including low-dose beta blocker and ACE 2. Acute systolic heart failure with no clinical shortness of breath or evidence of volume overload on exam today. She is currently on guideline based therapy including an ARB and beta blocker. 3. Normotensive on current medical regimen 4. Nonobstructive coronary disease 5.  Sundowning resolved.   Plan discharge today on low-dose therapy as mentioned above. Needs transition of care follow-up in 7-10 days. A basic metabolic panel should be performed at that time. Will need an echocardiogram done in 6-8 weeks to re-documented LV function. Anticipated that LV function will improve. No driving. Should stay with family for at least 7-10 days before attempting to reside at home independently.  Signed, Sinclair Grooms, MD  07/29/2017, 9:41 AM

## 2017-07-30 ENCOUNTER — Emergency Department (HOSPITAL_COMMUNITY): Payer: Medicare Other

## 2017-07-30 ENCOUNTER — Encounter (HOSPITAL_COMMUNITY): Payer: Self-pay | Admitting: *Deleted

## 2017-07-30 ENCOUNTER — Emergency Department (HOSPITAL_COMMUNITY)
Admission: EM | Admit: 2017-07-30 | Discharge: 2017-07-30 | Disposition: A | Discharge status: Home / Self Care | Payer: Medicare Other | Attending: Emergency Medicine | Admitting: Emergency Medicine

## 2017-07-30 DIAGNOSIS — Z7982 Long term (current) use of aspirin: Secondary | ICD-10-CM | POA: Insufficient documentation

## 2017-07-30 DIAGNOSIS — E119 Type 2 diabetes mellitus without complications: Secondary | ICD-10-CM | POA: Insufficient documentation

## 2017-07-30 DIAGNOSIS — I110 Hypertensive heart disease with heart failure: Secondary | ICD-10-CM | POA: Insufficient documentation

## 2017-07-30 DIAGNOSIS — R079 Chest pain, unspecified: Secondary | ICD-10-CM | POA: Insufficient documentation

## 2017-07-30 DIAGNOSIS — I2510 Atherosclerotic heart disease of native coronary artery without angina pectoris: Secondary | ICD-10-CM | POA: Insufficient documentation

## 2017-07-30 DIAGNOSIS — I252 Old myocardial infarction: Secondary | ICD-10-CM | POA: Diagnosis not present

## 2017-07-30 DIAGNOSIS — Z79899 Other long term (current) drug therapy: Secondary | ICD-10-CM | POA: Diagnosis not present

## 2017-07-30 DIAGNOSIS — I5040 Unspecified combined systolic (congestive) and diastolic (congestive) heart failure: Secondary | ICD-10-CM | POA: Diagnosis not present

## 2017-07-30 DIAGNOSIS — R0789 Other chest pain: Secondary | ICD-10-CM | POA: Diagnosis not present

## 2017-07-30 DIAGNOSIS — Z7984 Long term (current) use of oral hypoglycemic drugs: Secondary | ICD-10-CM | POA: Insufficient documentation

## 2017-07-30 DIAGNOSIS — R061 Stridor: Secondary | ICD-10-CM | POA: Diagnosis not present

## 2017-07-30 DIAGNOSIS — R0602 Shortness of breath: Secondary | ICD-10-CM | POA: Diagnosis not present

## 2017-07-30 LAB — CBC
HCT: 35.8 % — ABNORMAL LOW (ref 36.0–46.0)
HEMOGLOBIN: 11.7 g/dL — AB (ref 12.0–15.0)
MCH: 30.8 pg (ref 26.0–34.0)
MCHC: 32.7 g/dL (ref 30.0–36.0)
MCV: 94.2 fL (ref 78.0–100.0)
Platelets: 177 10*3/uL (ref 150–400)
RBC: 3.8 MIL/uL — ABNORMAL LOW (ref 3.87–5.11)
RDW: 14.5 % (ref 11.5–15.5)
WBC: 7.3 10*3/uL (ref 4.0–10.5)

## 2017-07-30 LAB — BASIC METABOLIC PANEL
ANION GAP: 6 (ref 5–15)
BUN: 21 mg/dL — ABNORMAL HIGH (ref 6–20)
CHLORIDE: 108 mmol/L (ref 101–111)
CO2: 25 mmol/L (ref 22–32)
CREATININE: 1.29 mg/dL — AB (ref 0.44–1.00)
Calcium: 8.6 mg/dL — ABNORMAL LOW (ref 8.9–10.3)
GFR calc non Af Amer: 36 mL/min — ABNORMAL LOW (ref >60)
GFR, EST AFRICAN AMERICAN: 42 mL/min — AB (ref >60)
GLUCOSE: 165 mg/dL — AB (ref 65–99)
Potassium: 4.4 mmol/L (ref 3.5–5.1)
Sodium: 139 mmol/L (ref 135–145)

## 2017-07-30 LAB — I-STAT TROPONIN, ED: Troponin i, poc: 0.19 ng/mL (ref 0.00–0.08)

## 2017-07-30 LAB — BRAIN NATRIURETIC PEPTIDE: B Natriuretic Peptide: 1434.4 pg/mL — ABNORMAL HIGH (ref 0.0–100.0)

## 2017-07-30 MED ORDER — FUROSEMIDE 10 MG/ML IJ SOLN
40.0000 mg | Freq: Once | INTRAMUSCULAR | Status: AC
  Administered 2017-07-30: 40 mg via INTRAVENOUS
  Filled 2017-07-30: qty 4

## 2017-07-30 NOTE — ED Triage Notes (Signed)
Pt reports She had CP This AM . CP resolved on arrival to ED. Pt also reports feeling SHOB this AM  And on arrival to ED.

## 2017-07-30 NOTE — ED Provider Notes (Signed)
Medical screening examination/treatment/procedure(s) were conducted as a shared visit with non-physician practitioner(s) and myself.  I personally evaluated the patient during the encounter.   EKG Interpretation  Date/Time:  Saturday July 30 2017 10:57:59 EDT Ventricular Rate:  83 PR Interval:    QRS Duration: 132 QT Interval:  411 QTC Calculation: 483 R Axis:   -59 Text Interpretation:  Sinus rhythm Left bundle branch block Confirmed by Fredia Sorrow [phone]) on 07/30/2017 11:03:41 AM Also confirmed by Fredia Sorrow [phone]), editor Drema Pry [phone])  on 07/30/2017 11:22:28 AM       Results for orders placed or performed during the hospital encounter of 89/38/10  Basic metabolic panel  Result Value Ref Range   Sodium 139 135 - 145 mmol/L   Potassium 4.4 3.5 - 5.1 mmol/L   Chloride 108 101 - 111 mmol/L   CO2 25 22 - 32 mmol/L   Glucose, Bld 165 (H) 65 - 99 mg/dL   BUN 21 (H) 6 - 20 mg/dL   Creatinine, Ser 1.29 (H) 0.44 - 1.00 mg/dL   Calcium 8.6 (L) 8.9 - 10.3 mg/dL   GFR calc non Af Amer 36 (L) >60 mL/min   GFR calc Af Amer 42 (L) >60 mL/min   Anion gap 6 5 - 15  CBC  Result Value Ref Range   WBC 7.3 4.0 - 10.5 K/uL   RBC 3.80 (L) 3.87 - 5.11 MIL/uL   Hemoglobin 11.7 (L) 12.0 - 15.0 g/dL   HCT 35.8 (L) 36.0 - 46.0 %   MCV 94.2 78.0 - 100.0 fL   MCH 30.8 26.0 - 34.0 pg   MCHC 32.7 30.0 - 36.0 g/dL   RDW 14.5 11.5 - 15.5 %   Platelets 177 150 - 400 K/uL  Brain natriuretic peptide  Result Value Ref Range   B Natriuretic Peptide 1,434.4 (H) 0.0 - 100.0 pg/mL  I-stat troponin, ED  Result Value Ref Range   Troponin i, poc 0.19 (HH) 0.00 - 0.08 ng/mL   Comment 3           Dg Chest 2 View  Result Date: 07/30/2017 CLINICAL DATA:  Patient with chest pain.  Shortness of breath. EXAM: CHEST  2 VIEW COMPARISON:  Chest radiograph 07/26/2017. FINDINGS: Monitoring leads overlie the patient. Stable cardiomegaly. Minimal heterogeneous opacities within the lung bases  bilaterally. Small bilateral pleural effusions. Midthoracic spine degenerative changes. Aortic atherosclerosis. No pneumothorax. IMPRESSION: Small bilateral pleural effusions with bibasilar heterogeneous opacities which may represent atelectasis. Mild interstitial edema is a consideration. Electronically Signed   By: Lovey Newcomer M.D.   On: 07/30/2017 11:48   Dg Chest 2 View  Result Date: 07/26/2017 CLINICAL DATA:  Sudden onset of chest pain and shortness of breath EXAM: CHEST  2 VIEW COMPARISON:  03/08/2017 FINDINGS: Chronic cardiomegaly. Aortic tortuosity. Mediastinal contours are distorted by rotation. Low volume chest with interstitial crowding. Cannot exclude interstitial opacities and mild edema. No effusion or air bronchogram. Remote proximal left humerus fracture. IMPRESSION: Interstitial coarsening that is likely from low lung volumes. Cannot exclude early pulmonary edema in the appropriate clinical setting. Electronically Signed   By: Monte Fantasia M.D.   On: 07/26/2017 13:19    The patient seen by me along with the physician assistant. Patient with recent discharge from hospital. Patient had cardiac cath at that time with no significant coronary artery disease. Did have a very poor ejection fraction. We discussed her presentation basically coming back in with chest pain with cardiology. Initial troponin was elevated.  But improved from where she was. They do not recommend repeat troponin setting safe for discharge home. This was expressed out to the patient and her family. They understand and are in agreement. Patient we discharged home with follow-up with cardiology. Here patient in no acute distress. Chest x-ray here today with some small bilateral pleural effusions but no other significant findings.   Fredia Sorrow, MD 07/30/17 1505

## 2017-07-30 NOTE — Discharge Instructions (Signed)
Please call and follow up with cardiology if continue to have chest pains and shortness of breath next week. Return if worsening symptoms.

## 2017-07-30 NOTE — ED Provider Notes (Signed)
[facility] DEPT Provider Note   CSN: [id number] Arrival date & time: 07/30/17  1041     History   Chief Complaint Chief Complaint  Patient presents with  . Chest Pain    HPI Robin Guzman is a 83 y.o. female.  HPI Robin Guzman is a 83 y.o. femalewith history of CHF, coronary artery disease, diabetes, hypertension, presents to emergency department complaining of chest pain. Patient was just admitted to the hospital for the same and discharged yesterday. She states today she was sitting on the couch, when suddenly develop sharp central chest pain with associated shortness of breath. She states "I just fell over onto the couch because of pain." She denies feeling dizzy or lightheaded. Denies any shortness of breath. She states symptoms very similar to her pain that she had when she was here just a few days ago. She denies any pain or swelling in her legs. She denies any cough or congestion. Her pain has now resolved. While in the hospital, patient had a full workup including cardiac cath which showed mild nonobstructive coronary artery disease and severe reduced left ventricular systolic function with EF of 25-30%. Patient received no treatment prior to coming in.  Past Medical History:  Diagnosis Date  . Arthritis   . CAD (coronary artery disease), native coronary artery    07/27/17 nonobstructive, EF 25-35% by LV gram  . CHF (congestive heart failure) ([place])   . Dementia   . Diabetes mellitus without complication ([place])   . Dyslipidemia   . Dyspnea   . Dyspnea on exertion   . History of stress test 07/05/2006   High risk scan cardiac cathe was recommended.  [name] Hx of echocardiogram 04/09/2011   EF 55% Mildly hypertrophic left ventricle with normal systolic function, Moderate (grade II) diastolic dysfunction. Elevated left atrial pressure. No significant valvular abnormalities. No pericardial effusion. Mild to moderate pulmonary arterial hypertension.  . Hypercholesteremia   .  Hypertension   . Myocardial infarct ([place])   . Skin cancer    HISTORY OF     Patient Active Problem List   Diagnosis Date Noted  . Acute systolic heart failure ([place]) 07/28/2017  . Sundowning 07/28/2017  . NSTEMI (non-ST elevated myocardial infarction) ([place]) 07/26/2017  . Closed 3-part fracture of proximal humerus, left, with routine healing, subsequent encounter 12/02/2016  . UTI (urinary tract infection) 12/25/2013  . Dyspnea 12/25/2013  . Near syncope 12/25/2013  . CAD (coronary artery disease) 12/25/2013  . Chronic diastolic heart failure ([place]) 08/07/2013  . Essential hypertension 08/07/2013  . DM2 (diabetes mellitus, type 2) ([place]) 08/07/2013  . Hyperlipidemia 08/07/2013    Past Surgical History:  Procedure Laterality Date  . ABDOMINAL HYSTERECTOMY    . CATARACT EXTRACTION W/PHACO Right 05/20/2017   Procedure: CATARACT EXTRACTION PHACO AND INTRAOCULAR LENS PLACEMENT RIGHT EYE;  Surgeon: Tonny Branch, MD;  Location: [facility];  Service: Ophthalmology;  Laterality: Right;  CDE: 17.77  . CATARACT EXTRACTION W/PHACO Left 06/21/2017   Procedure: CATARACT EXTRACTION PHACO AND INTRAOCULAR LENS PLACEMENT (IOC);  Surgeon: Tonny Branch, MD;  Location: [facility];  Service: Ophthalmology;  Laterality: Left;  CDE: 8.90  . LEFT HEART CATH AND CORONARY ANGIOGRAPHY N/A 07/27/2017   Procedure: LEFT HEART CATH AND CORONARY ANGIOGRAPHY;  Surgeon: Wellington Hampshire, MD;  Location: [facility];  Service: Cardiovascular;  Laterality: N/A;    OB History    Gravida Para Term Preterm AB Living   3 3 3  SAB TAB Ectopic Multiple Live Births                   Home Medications    Prior to Admission medications   Medication Sig Start Date End Date Taking? Authorizing Provider  aspirin EC 81 MG tablet Take 81 mg by mouth daily.   Yes [provider]  carvedilol (COREG) 3.125 MG tablet Take 1 tablet (3.125 mg total) by mouth 2 (two) times daily with a meal. 07/29/17  Yes Reino Bellis B,  NP  diclofenac sodium (VOLTAREN) 1 % GEL Apply 2 g topically daily as needed (for pain).    Yes [provider]  glipiZIDE (GLUCOTROL) 5 MG tablet Take 5 mg by mouth 2 (two) times daily before a meal.    Yes [provider]  lisinopril (PRINIVIL,ZESTRIL) 2.5 MG tablet Take 1 tablet (2.5 mg total) by mouth daily. 07/30/17  Yes Cheryln Manly, NP    Family History Family History  Problem Relation Age of Onset  . Cancer Mother   . Heart Problems Father   . Cancer Brother        brain  . Cancer Sister        brain    Social History Social History  Substance Use Topics  . Smoking status: Never Smoker  . Smokeless tobacco: Never Used  . Alcohol use No     Allergies   Daypro [oxaprozin] and Advil [ibuprofen]   Review of Systems Review of Systems  Constitutional: Negative for chills and fever.  Respiratory: Positive for chest tightness. Negative for cough and shortness of breath.   Cardiovascular: Positive for chest pain. Negative for palpitations and leg swelling.  Gastrointestinal: Negative for abdominal pain, diarrhea, nausea and vomiting.  Genitourinary: Negative for dysuria, flank pain and pelvic pain.  Musculoskeletal: Negative for arthralgias, myalgias, neck pain and neck stiffness.  Skin: Negative for rash.  Neurological: Negative for dizziness, weakness and headaches.  All other systems reviewed and are negative.    Physical Exam Updated Vital Signs BP (!) 145/90   Pulse 84   Resp 20   Ht 5' (1.524 m)   Wt 68.9 kg (152 lb)   SpO2 100%   BMI 29.69 kg/m   Physical Exam  Constitutional: She is oriented to person, place, and time. She appears well-developed and well-nourished. No distress.  HENT:  Head: Normocephalic.  Eyes: Conjunctivae are normal.  Neck: Neck supple.  Cardiovascular: Normal rate, regular rhythm and normal heart sounds.   Pulmonary/Chest: Effort normal and breath sounds normal. No respiratory distress. She has no  wheezes. She has no rales.  Abdominal: Soft. Bowel sounds are normal. She exhibits no distension. There is no tenderness. There is no rebound.  Musculoskeletal: She exhibits no edema.  Neurological: She is alert and oriented to person, place, and time.  Skin: Skin is warm and dry.  Psychiatric: She has a normal mood and affect. Her behavior is normal.  Nursing note and vitals reviewed.    ED Treatments / Results  Labs (all labs ordered are listed, but only abnormal results are displayed) Labs Reviewed  I-STAT TROPONIN, ED - Abnormal; Notable for the following:       Result Value   Troponin i, poc 0.19 (*)    All other components within normal limits  BASIC METABOLIC PANEL  CBC  BRAIN NATRIURETIC PEPTIDE    EKG  EKG Interpretation  Date/Time:  Saturday July 30 2017 10:57:59 EDT Ventricular Rate:  83 PR Interval:  QRS Duration: 132 QT Interval:  411 QTC Calculation: 483 R Axis:   -59 Text Interpretation:  Sinus rhythm Left bundle branch block Confirmed by Fredia Sorrow [phone]) on 07/30/2017 11:03:41 AM Also confirmed by Fredia Sorrow [phone]), editor Drema Pry [phone])  on 07/30/2017 11:22:28 AM       Radiology Dg Chest 2 View  Result Date: 07/30/2017 CLINICAL DATA:  Patient with chest pain.  Shortness of breath. EXAM: CHEST  2 VIEW COMPARISON:  Chest radiograph 07/26/2017. FINDINGS: Monitoring leads overlie the patient. Stable cardiomegaly. Minimal heterogeneous opacities within the lung bases bilaterally. Small bilateral pleural effusions. Midthoracic spine degenerative changes. Aortic atherosclerosis. No pneumothorax. IMPRESSION: Small bilateral pleural effusions with bibasilar heterogeneous opacities which may represent atelectasis. Mild interstitial edema is a consideration. Electronically Signed   By: Lovey Newcomer M.D.   On: 07/30/2017 11:48    Procedures Procedures (including critical care time)  Medications Ordered in ED Medications - No data to  display   Initial Impression / Assessment and Plan / ED Course  I have reviewed the triage vital signs and the nursing notes.  Pertinent labs & imaging results that were available during my care of the patient were reviewed by me and considered in my medical decision making (see chart for details).     Patient with episode of chest pain, now resolved. Just discharged from the hospital yesterday for the same. We'll recheck labs, EKG, chest x-ray. Patient is currently in no acute distress.  1:46 PM Discussed with cardiology. Advised to give 40mg  of Lasix IV. OK to dc home with outpatient follow up.   Vitals:   07/30/17 1345 07/30/17 1417 07/30/17 1430 07/30/17 1500  BP: (!) 131/98 140/87 (!) 145/83 139/76  Pulse: 77 82 81 78  Resp: 12 18 (!) 22 17  SpO2: 97% 96% 96% 96%  Weight:      Height:         Final Clinical Impressions(s) / ED Diagnoses   Final diagnoses:  Chest pain, unspecified type    New Prescriptions New Prescriptions   No medications on file     Jeannett Senior, PA-C 07/31/17 [place], [place], MD 08/03/17 2217

## 2017-08-04 ENCOUNTER — Ambulatory Visit (INDEPENDENT_AMBULATORY_CARE_PROVIDER_SITE_OTHER): Payer: Medicare Other | Admitting: Adult Health

## 2017-08-04 ENCOUNTER — Encounter: Payer: Self-pay | Admitting: Adult Health

## 2017-08-04 VITALS — BP 122/68 | HR 68 | Ht 60.0 in | Wt 151.0 lb

## 2017-08-04 DIAGNOSIS — I425 Other restrictive cardiomyopathy: Secondary | ICD-10-CM | POA: Diagnosis not present

## 2017-08-04 DIAGNOSIS — I2510 Atherosclerotic heart disease of native coronary artery without angina pectoris: Secondary | ICD-10-CM | POA: Diagnosis not present

## 2017-08-04 MED ORDER — FUROSEMIDE 20 MG PO TABS: 20.0000 mg | ORAL_TABLET | Freq: Every day | ORAL | 3 refills | Status: DC | PRN

## 2017-08-04 NOTE — Progress Notes (Signed)
Cardiology Office Note   Date:  08/04/2017   ID:  PREETHI SCANTLEBURY, DOB 05/31/1928, MRN [id number]  PCP:  Celene Squibb, MD  Cardiologist:  [place] Chief Complaint  Patient presents with  . Coronary Artery Disease  . Cardiomyopathy    Taku Tsubo   . Congestive Heart Failure   History of Present Illness: Robin Guzman is a 83 y.o. female who presents for posthospitalization follow-up after admission for coronary artery disease with cardiac catheterization completed on 07/27/2017 revealing mild nonobstructive CAD, severely reduced LV systolic function of 52% to 30%, wall motion abnormality consistent with stress-induced cardiomyopathy, other history includes hypertension. On discharge the patient was started on medical regimen to include low-dose beta blocker and ACE inhibitor. She was taken off of diltiazem.  She is very anxious and has had confusion while hospitalized. She is now living with her daughter. She is no longer driving. She is very hard of hearing.  She had recurrent chest pain 4 days ago and was seen again in the ER. She was found to have mild fluid retention and was given diureses.   She has a very attentive daughter and granddaughter. They are making sure she is medically compliant.   Past Medical History:  Diagnosis Date  . Arthritis   . CAD (coronary artery disease), native coronary artery    07/27/17 nonobstructive, EF 25-35% by LV gram  . CHF (congestive heart failure) ([place])   . Dementia   . Diabetes mellitus without complication ([place])   . Dyslipidemia   . Dyspnea   . Dyspnea on exertion   . History of stress test 07/05/2006   High risk scan cardiac cathe was recommended.  [name] Hx of echocardiogram 04/09/2011   EF 55% Mildly hypertrophic left ventricle with normal systolic function, Moderate (grade II) diastolic dysfunction. Elevated left atrial pressure. No significant valvular abnormalities. No pericardial effusion. Mild to moderate pulmonary arterial hypertension.   . Hypercholesteremia   . Hypertension   . Myocardial infarct ([place])   . Skin cancer    HISTORY OF     Past Surgical History:  Procedure Laterality Date  . ABDOMINAL HYSTERECTOMY    . CATARACT EXTRACTION W/PHACO Right 05/20/2017   Procedure: CATARACT EXTRACTION PHACO AND INTRAOCULAR LENS PLACEMENT RIGHT EYE;  Surgeon: Tonny Branch, MD;  Location: [facility];  Service: Ophthalmology;  Laterality: Right;  CDE: 17.77  . CATARACT EXTRACTION W/PHACO Left 06/21/2017   Procedure: CATARACT EXTRACTION PHACO AND INTRAOCULAR LENS PLACEMENT (IOC);  Surgeon: Tonny Branch, MD;  Location: [facility];  Service: Ophthalmology;  Laterality: Left;  CDE: 8.90  . LEFT HEART CATH AND CORONARY ANGIOGRAPHY N/A 07/27/2017   Procedure: LEFT HEART CATH AND CORONARY ANGIOGRAPHY;  Surgeon: Wellington Hampshire, MD;  Location: [facility];  Service: Cardiovascular;  Laterality: N/A;     Current Outpatient Prescriptions  Medication Sig Dispense Refill  . aspirin EC 81 MG tablet Take 81 mg by mouth daily.    . carvedilol (COREG) 3.125 MG tablet Take 1 tablet (3.125 mg total) by mouth 2 (two) times daily with a meal. 60 tablet 2  . diclofenac sodium (VOLTAREN) 1 % GEL Apply 2 g topically daily as needed (for pain).     [name] glipiZIDE (GLUCOTROL) 5 MG tablet Take 5 mg by mouth 2 (two) times daily before a meal.     . lisinopril (PRINIVIL,ZESTRIL) 2.5 MG tablet Take 1 tablet (2.5 mg total) by mouth daily. 30 tablet 2  . furosemide (LASIX) 20 MG tablet  Take 1 tablet (20 mg total) by mouth daily as needed. 90 tablet 3   No current facility-administered medications for this visit.     Allergies:   Daypro [oxaprozin] and Advil [ibuprofen]    Social History:  The patient  reports that she has never smoked. She has never used smokeless tobacco. She reports that she does not drink alcohol or use drugs.   Family History:  The patient's family history includes Cancer in her brother, mother, and sister; Heart Problems in her father.     ROS: All other systems are reviewed and negative. Unless otherwise mentioned in H&P    PHYSICAL EXAM: VS:  BP 122/68 (BP Location: Right Arm)   Pulse 68   Ht 5' (1.524 m)   Wt 151 lb (68.5 kg)   SpO2 98%   BMI 29.49 kg/m  , BMI Body mass index is 29.49 kg/m. GEN: Well nourished, well developed, in no acute distress  HEENT: normal  Neck: no JVD, carotid bruits, or masses Cardiac: RRR; no murmurs, rubs, or gallops,no edema  Respiratory:  clear to auscultation bilaterally, normal work of breathing GI: soft, nontender, nondistended, + BS MS: no deformity or atrophy  Skin: warm and dry, no rash Neuro:  Strength and sensation are intact Psych: euthymic mood, full affect   Recent Labs: 07/26/2017: ALT 23 07/30/2017: B Natriuretic Peptide 1,434.4; BUN 21; Creatinine, Ser 1.29; Hemoglobin 11.7; Platelets 177; Potassium 4.4; Sodium 139    Lipid Panel No results found for: CHOL, TRIG, HDL, CHOLHDL, VLDL, LDLCALC, LDLDIRECT    Wt Readings from Last 3 Encounters:  08/04/17 151 lb (68.5 kg)  07/30/17 152 lb (68.9 kg)  07/29/17 152 lb 4.8 oz (69.1 kg)      Other studies Reviewed: Catheterization 07/27/2017 Conclusion     Mid LAD lesion, 30 %stenosed.  Ost 1st Diag to 1st Diag lesion, 40 %stenosed.  Mid LAD to Dist LAD lesion, 20 %stenosed.  There is severe left ventricular systolic dysfunction.  LV end diastolic pressure is moderately elevated.  The left ventricular ejection fraction is 25-35% by visual estimate.   1. Mild nonobstructive coronary artery disease. 2. Severely reduced LV systolic function with an EF of 25-30% with wall motion abnormality consistent with stress-induced cardiomyopathy. 3. Moderately elevated left ventricular end-diastolic pressure.   Echocardiogram 02/11/2016 Left ventricle: The cavity size was normal. Wall thickness was   increased in a pattern of mild LVH. The estimated ejection   fraction was 55%. Doppler parameters are consistent with  abnormal   left ventricular relaxation (grade 1 diastolic dysfunction). - Aortic valve: There was mild regurgitation. - Atrial septum: No defect or patent foramen ovale was identified. - Pericardium, extracardiac: Small pericardial effusion with no   tamponade.  ASSESSMENT AND PLAN:  1. CAD: Recent cardiac cath with mild non-obstructive CAD. Continue ASA,.   2. Systolic Dysfunction with Takotsubo Cardiomyopathy: EF of 25%-30 % per cath She is now on coreg and lisinopril. I have given her an Rx for lasix to use prn fluid retention or worsening dyspnea. Will not start Entresto at this time. She is advised on low sodium diet.   3. Diabetes: Followed by PCP.    Current medicines are reviewed at length with the patient today.    Labs/ tests ordered today include:  Robin Guzman, ANP, AACC   08/04/2017 3:23 PM    [facility] [address] [place], [place] [address] Phone: [phone]; Fax: [phone]

## 2017-08-04 NOTE — Patient Instructions (Signed)
Medication Instructions:  Your physician has recommended you make the following change in your medication: Start Lasix 20 mg As Needed    Labwork: NONE   Testing/Procedures: NONE  Follow-Up: Your physician recommends that you schedule a follow-up appointment in: 1 month    Any Other Special Instructions Will Be Listed Below (If Applicable).     If you need a refill on your cardiac medications before your next appointment, please call your pharmacy. Thank you for choosing [facility]!

## 2017-08-12 DIAGNOSIS — F411 Generalized anxiety disorder: Secondary | ICD-10-CM | POA: Diagnosis not present

## 2017-08-12 DIAGNOSIS — Z6827 Body mass index (BMI) 27.0-27.9, adult: Secondary | ICD-10-CM | POA: Diagnosis not present

## 2017-08-12 DIAGNOSIS — Z712 Person consulting for explanation of examination or test findings: Secondary | ICD-10-CM | POA: Diagnosis not present

## 2017-08-12 DIAGNOSIS — M6281 Muscle weakness (generalized): Secondary | ICD-10-CM | POA: Diagnosis not present

## 2017-08-12 DIAGNOSIS — E1122 Type 2 diabetes mellitus with diabetic chronic kidney disease: Secondary | ICD-10-CM | POA: Diagnosis not present

## 2017-08-12 DIAGNOSIS — I428 Other cardiomyopathies: Secondary | ICD-10-CM | POA: Diagnosis not present

## 2017-08-12 DIAGNOSIS — I214 Non-ST elevation (NSTEMI) myocardial infarction: Secondary | ICD-10-CM | POA: Diagnosis not present

## 2017-08-16 ENCOUNTER — Encounter (HOSPITAL_COMMUNITY): Payer: Self-pay | Admitting: *Deleted

## 2017-08-16 ENCOUNTER — Emergency Department (HOSPITAL_COMMUNITY): Payer: Medicare Other

## 2017-08-16 ENCOUNTER — Emergency Department (HOSPITAL_COMMUNITY)
Admission: EM | Admit: 2017-08-16 | Discharge: 2017-08-16 | Disposition: A | Discharge status: Home / Self Care | Payer: Medicare Other | Attending: Emergency Medicine | Admitting: Emergency Medicine

## 2017-08-16 DIAGNOSIS — I509 Heart failure, unspecified: Secondary | ICD-10-CM | POA: Diagnosis not present

## 2017-08-16 DIAGNOSIS — Z7984 Long term (current) use of oral hypoglycemic drugs: Secondary | ICD-10-CM | POA: Insufficient documentation

## 2017-08-16 DIAGNOSIS — I110 Hypertensive heart disease with heart failure: Secondary | ICD-10-CM | POA: Insufficient documentation

## 2017-08-16 DIAGNOSIS — E119 Type 2 diabetes mellitus without complications: Secondary | ICD-10-CM | POA: Insufficient documentation

## 2017-08-16 DIAGNOSIS — F0390 Unspecified dementia without behavioral disturbance: Secondary | ICD-10-CM | POA: Insufficient documentation

## 2017-08-16 DIAGNOSIS — I10 Essential (primary) hypertension: Secondary | ICD-10-CM | POA: Diagnosis not present

## 2017-08-16 DIAGNOSIS — R079 Chest pain, unspecified: Secondary | ICD-10-CM | POA: Insufficient documentation

## 2017-08-16 DIAGNOSIS — R0789 Other chest pain: Secondary | ICD-10-CM | POA: Diagnosis not present

## 2017-08-16 DIAGNOSIS — Z79899 Other long term (current) drug therapy: Secondary | ICD-10-CM | POA: Diagnosis not present

## 2017-08-16 DIAGNOSIS — Z7982 Long term (current) use of aspirin: Secondary | ICD-10-CM | POA: Insufficient documentation

## 2017-08-16 LAB — TROPONIN I
TROPONIN I: 0.03 ng/mL — AB (ref <0.03)
Troponin I: 0.03 ng/mL (ref <0.03)

## 2017-08-16 LAB — BASIC METABOLIC PANEL
Anion gap: 9 (ref 5–15)
BUN: 23 mg/dL — ABNORMAL HIGH (ref 6–20)
CO2: 25 mmol/L (ref 22–32)
Calcium: 9.2 mg/dL (ref 8.9–10.3)
Chloride: 101 mmol/L (ref 101–111)
Creatinine, Ser: 1.21 mg/dL — ABNORMAL HIGH (ref 0.44–1.00)
GFR calc Af Amer: 45 mL/min — ABNORMAL LOW (ref >60)
GFR, EST NON AFRICAN AMERICAN: 39 mL/min — AB (ref >60)
GLUCOSE: 222 mg/dL — AB (ref 65–99)
Potassium: 4.4 mmol/L (ref 3.5–5.1)
SODIUM: 135 mmol/L (ref 135–145)

## 2017-08-16 LAB — CBC
HEMATOCRIT: 39.3 % (ref 36.0–46.0)
HEMOGLOBIN: 12.8 g/dL (ref 12.0–15.0)
MCH: 30.5 pg (ref 26.0–34.0)
MCHC: 32.6 g/dL (ref 30.0–36.0)
MCV: 93.8 fL (ref 78.0–100.0)
Platelets: 172 10*3/uL (ref 150–400)
RBC: 4.19 MIL/uL (ref 3.87–5.11)
RDW: 14.1 % (ref 11.5–15.5)
WBC: 5.7 10*3/uL (ref 4.0–10.5)

## 2017-08-16 LAB — BRAIN NATRIURETIC PEPTIDE: B NATRIURETIC PEPTIDE 5: 297 pg/mL — AB (ref 0.0–100.0)

## 2017-08-16 NOTE — ED Provider Notes (Signed)
[facility] DEPT Provider Note   CSN: [id number] Arrival date & time: 08/16/17  1117     History   Chief Complaint Chief Complaint  Patient presents with  . Chest Pain    HPI Robin Guzman is a 83 y.o. female.  HPI Pt states she was at home today, shortly prior to arrival when she started to feel weak in her legs and then began having chest discomfort with a sensation of shortness of breath.  She told her family and they called 911.  She was given asa by family and oxygen by EMS.  Her symptoms improved but she does not feel like her breathing is entirely normal.  No other complaints of leg swelling, cough, congestion, vomiting, diarrhea, fever.  Pt was in the hospital twice recently for similar sx.  She was admitted after a visit on 9/4 .  She had a cardiac cath.  No obstructive disease noted but she was diagnosed with stress induced cardiomyopathy.  She was also seen on 9/8 after a similar episode.  She was seen in the ED and released.  Since then she has been doing well until today. Past Medical History:  Diagnosis Date  . Arthritis   . CAD (coronary artery disease), native coronary artery    07/27/17 nonobstructive, EF 25-35% by LV gram  . CHF (congestive heart failure) ([place])   . Dementia   . Diabetes mellitus without complication ([place])   . Dyslipidemia   . Dyspnea   . Dyspnea on exertion   . History of stress test 07/05/2006   High risk scan cardiac cathe was recommended.  [name] Hx of echocardiogram 04/09/2011   EF 55% Mildly hypertrophic left ventricle with normal systolic function, Moderate (grade II) diastolic dysfunction. Elevated left atrial pressure. No significant valvular abnormalities. No pericardial effusion. Mild to moderate pulmonary arterial hypertension.  . Hypercholesteremia   . Hypertension   . Myocardial infarct ([place])   . Skin cancer    HISTORY OF     Patient Active Problem List   Diagnosis Date Noted  . Acute systolic heart failure ([place]) 07/28/2017    . Sundowning 07/28/2017  . NSTEMI (non-ST elevated myocardial infarction) ([place]) 07/26/2017  . Closed 3-part fracture of proximal humerus, left, with routine healing, subsequent encounter 12/02/2016  . UTI (urinary tract infection) 12/25/2013  . Dyspnea 12/25/2013  . Near syncope 12/25/2013  . CAD (coronary artery disease) 12/25/2013  . Chronic diastolic heart failure ([place]) 08/07/2013  . Essential hypertension 08/07/2013  . DM2 (diabetes mellitus, type 2) ([place]) 08/07/2013  . Hyperlipidemia 08/07/2013    Past Surgical History:  Procedure Laterality Date  . ABDOMINAL HYSTERECTOMY    . CATARACT EXTRACTION W/PHACO Right 05/20/2017   Procedure: CATARACT EXTRACTION PHACO AND INTRAOCULAR LENS PLACEMENT RIGHT EYE;  Surgeon: Tonny Branch, MD;  Location: [facility];  Service: Ophthalmology;  Laterality: Right;  CDE: 17.77  . CATARACT EXTRACTION W/PHACO Left 06/21/2017   Procedure: CATARACT EXTRACTION PHACO AND INTRAOCULAR LENS PLACEMENT (IOC);  Surgeon: Tonny Branch, MD;  Location: [facility];  Service: Ophthalmology;  Laterality: Left;  CDE: 8.90  . LEFT HEART CATH AND CORONARY ANGIOGRAPHY N/A 07/27/2017   Procedure: LEFT HEART CATH AND CORONARY ANGIOGRAPHY;  Surgeon: Wellington Hampshire, MD;  Location: [facility];  Service: Cardiovascular;  Laterality: N/A;    OB History    Gravida Para Term Preterm AB Living   3 3 3          SAB TAB Ectopic Multiple Live Births  Home Medications    Prior to Admission medications   Medication Sig Start Date End Date Taking? Authorizing Provider  aspirin EC 81 MG tablet Take 81 mg by mouth daily.   Yes [provider]  carvedilol (COREG) 3.125 MG tablet Take 1 tablet (3.125 mg total) by mouth 2 (two) times daily with a meal. 07/29/17  Yes Reino Bellis B, NP  diclofenac sodium (VOLTAREN) 1 % GEL Apply 2 g topically daily as needed (for pain).    Yes [provider]  furosemide (LASIX) 20 MG tablet Take 1 tablet (20 mg total)  by mouth daily as needed. 08/04/17 11/02/17 Yes Lendon Colonel, NP  glipiZIDE (GLUCOTROL) 5 MG tablet Take 5 mg by mouth 2 (two) times daily before a meal.    Yes [provider]  lisinopril (PRINIVIL,ZESTRIL) 2.5 MG tablet Take 1 tablet (2.5 mg total) by mouth daily. 07/30/17  Yes Cheryln Manly, NP    Family History Family History  Problem Relation Age of Onset  . Cancer Mother   . Heart Problems Father   . Cancer Brother        brain  . Cancer Sister        brain    Social History Social History  Substance Use Topics  . Smoking status: Never Smoker  . Smokeless tobacco: Never Used  . Alcohol use No     Allergies   Daypro [oxaprozin] and Advil [ibuprofen]   Review of Systems Review of Systems  All other systems reviewed and are negative.    Physical Exam Updated Vital Signs BP (!) 151/79   Pulse 60   Temp 98 F (36.7 C) (Oral)   Resp (!) 23   SpO2 95%   Physical Exam  Constitutional: No distress.  Elderly, frail  HENT:  Head: Normocephalic and atraumatic.  Right Ear: External ear normal.  Left Ear: External ear normal.  Eyes: Conjunctivae are normal. Right eye exhibits no discharge. Left eye exhibits no discharge. No scleral icterus.  Neck: Neck supple. No tracheal deviation present.  Cardiovascular: Normal rate, regular rhythm and intact distal pulses.   Pulmonary/Chest: Effort normal and breath sounds normal. No stridor. No respiratory distress. She has no wheezes. She has no rales.  Abdominal: Soft. Bowel sounds are normal. She exhibits no distension. There is no tenderness. There is no rebound and no guarding.  Musculoskeletal: She exhibits no edema or tenderness.  Neurological: She is alert. She has normal strength. No cranial nerve deficit (no facial droop, extraocular movements intact, no slurred speech) or sensory deficit. She exhibits normal muscle tone. She displays no seizure activity. Coordination normal.  Skin: Skin is warm and  dry. No rash noted. She is not diaphoretic.  Psychiatric: She has a normal mood and affect.  Nursing note and vitals reviewed.    ED Treatments / Results  Labs (all labs ordered are listed, but only abnormal results are displayed) Labs Reviewed  BASIC METABOLIC PANEL - Abnormal; Notable for the following:       Result Value   Glucose, Bld 222 (*)    BUN 23 (*)    Creatinine, Ser 1.21 (*)    GFR calc non Af Amer 39 (*)    GFR calc Af Amer 45 (*)    All other components within normal limits  TROPONIN I - Abnormal; Notable for the following:    Troponin I 0.03 (*)    All other components within normal limits  BRAIN NATRIURETIC PEPTIDE - Abnormal;  Notable for the following:    B Natriuretic Peptide 297.0 (*)    All other components within normal limits  TROPONIN I - Abnormal; Notable for the following:    Troponin I 0.03 (*)    All other components within normal limits  CBC    EKG  EKG Interpretation  Date/Time:  Tuesday August 16 2017 11:23:00 EDT Ventricular Rate:  76 PR Interval:    QRS Duration: 130 QT Interval:  430 QTC Calculation: 484 R Axis:   -37 Text Interpretation:  Sinus rhythm Left bundle branch block No significant change since last tracing Confirmed by Dorie Rank [phone]) on 08/16/2017 12:06:16 PM       Radiology Dg Chest 2 View  Result Date: 08/16/2017 CLINICAL DATA:  Chest pain. EXAM: CHEST  2 VIEW COMPARISON:  Radiographs of October 29, 2017. FINDINGS: Stable cardiomediastinal silhouette. Atherosclerosis of thoracic aorta is noted. No pneumothorax or pleural effusion is noted. Both lungs are clear. The visualized skeletal structures are unremarkable. IMPRESSION: No active cardiopulmonary disease.  Aortic atherosclerosis. Electronically Signed   By: Marijo Conception, M.D.   On: 08/16/2017 12:37    Procedures Procedures (including critical care time)  Medications Ordered in ED Medications - No data to display   Initial Impression / Assessment and  Plan / ED Course  I have reviewed the triage vital signs and the nursing notes.  Pertinent labs & imaging results that were available during my care of the patient were reviewed by me and considered in my medical decision making (see chart for details).   Patient presented to emergency room with recurrent chest pain. She was recently in the hospital and had a cardiac catheterization that did not show any evidence of significant obstructive disease. Diagnosed with stress-induced cardiomyopathy. Patient's laboratory tests today reassuring. Troponin was 0.03.  Patient's symptoms improved. She is feeling better and is ready for discharge. I think she can safely follow-up with her cardiologist.  Final Clinical Impressions(s) / ED Diagnoses   Final diagnoses:  Chest pain, unspecified type    New Prescriptions New Prescriptions   No medications on file     Dorie Rank, MD 08/16/17 1646

## 2017-08-16 NOTE — ED Notes (Signed)
Have given EKG to Dr. Tomi Bamberger

## 2017-08-16 NOTE — ED Notes (Signed)
CRITICAL VALUE ALERT  Critical Value:  Troponin 0.03  Date & Time Notied:  08/16/2017 1217  Provider Notified: Dr. Tomi Bamberger  Orders Received/Actions taken: Notified Dr. Tomi Bamberger

## 2017-08-16 NOTE — Discharge Instructions (Signed)
Follow up with your cardiologist, return to the ED as needed for worsening symptoms

## 2017-08-16 NOTE — ED Triage Notes (Signed)
Per EMS pt woke up and was eating breakfast and she began having chest pain. Pt was given 4 - 81 ASA by family. Pt is pain free now.

## 2017-09-05 NOTE — Progress Notes (Signed)
Cardiology Office Note   Date:  09/06/2017   ID:  Robin Guzman, DOB [birth year], MRN [id number]  PCP:  Celene Squibb, MD  Cardiologist:  Sanda Klein. MD  Chief Complaint  Patient presents with  . Coronary Artery Disease  . Cardiomyopathy  . Congestive Heart Failure  . Hypertension      History of Present Illness: Robin Guzman is a 83 y.o. female who presents for ongoing assessment and management of coronary artery disease, nonobstructive per cardiac catheterization in September 2018, severely reduced LV systolic function at 77% to 30%, wall motion amount was consistent with stress-induced cardiomyopathy, hypertension. She has very attentive family members who mixture she takes her medications and is eating properly.on last office visit she was doing well. She was given a prescription for Lasix 20 mg to use when necessary fluid retention only.  She comes today on follow up. She is doing really well, has been going to the grocery store and being compliant with medications. She still gets tired sometimes but otherwise is doing well.   Past Medical History:  Diagnosis Date  . Arthritis   . CAD (coronary artery disease), native coronary artery    07/27/17 nonobstructive, EF 25-35% by LV gram  . CHF (congestive heart failure) ([place])   . Dementia   . Diabetes mellitus without complication ([place])   . Dyslipidemia   . Dyspnea   . Dyspnea on exertion   . History of stress test 07/05/2006   High risk scan cardiac cathe was recommended.  [name] Hx of echocardiogram 04/09/2011   EF 55% Mildly hypertrophic left ventricle with normal systolic function, Moderate (grade II) diastolic dysfunction. Elevated left atrial pressure. No significant valvular abnormalities. No pericardial effusion. Mild to moderate pulmonary arterial hypertension.  . Hypercholesteremia   . Hypertension   . Myocardial infarct ([place])   . Skin cancer    HISTORY OF     Past Surgical History:  Procedure Laterality Date    . ABDOMINAL HYSTERECTOMY    . CATARACT EXTRACTION W/PHACO Right 05/20/2017   Procedure: CATARACT EXTRACTION PHACO AND INTRAOCULAR LENS PLACEMENT RIGHT EYE;  Surgeon: Tonny Branch, MD;  Location: [facility];  Service: Ophthalmology;  Laterality: Right;  CDE: 17.77  . CATARACT EXTRACTION W/PHACO Left 06/21/2017   Procedure: CATARACT EXTRACTION PHACO AND INTRAOCULAR LENS PLACEMENT (IOC);  Surgeon: Tonny Branch, MD;  Location: [facility];  Service: Ophthalmology;  Laterality: Left;  CDE: 8.90  . LEFT HEART CATH AND CORONARY ANGIOGRAPHY N/A 07/27/2017   Procedure: LEFT HEART CATH AND CORONARY ANGIOGRAPHY;  Surgeon: Wellington Hampshire, MD;  Location: [facility];  Service: Cardiovascular;  Laterality: N/A;     Current Outpatient Prescriptions  Medication Sig Dispense Refill  . aspirin EC 81 MG tablet Take 81 mg by mouth daily.    . carvedilol (COREG) 3.125 MG tablet Take 1 tablet (3.125 mg total) by mouth 2 (two) times daily with a meal. 60 tablet 2  . diclofenac sodium (VOLTAREN) 1 % GEL Apply 2 g topically daily as needed (for pain).     . furosemide (LASIX) 20 MG tablet Take 1 tablet (20 mg total) by mouth daily as needed. 90 tablet 3  . glipiZIDE (GLUCOTROL) 5 MG tablet Take 5 mg by mouth 2 (two) times daily before a meal.     . lisinopril (PRINIVIL,ZESTRIL) 2.5 MG tablet Take 1 tablet (2.5 mg total) by mouth daily. 30 tablet 2   No current facility-administered medications for this visit.  Allergies:   Daypro [oxaprozin] and Advil [ibuprofen]    Social History:  The patient  reports that she has never smoked. She has never used smokeless tobacco. She reports that she does not drink alcohol or use drugs.   Family History:  The patient's family history includes Cancer in her brother, mother, and sister; Heart Problems in her father.    ROS: All other systems are reviewed and negative. Unless otherwise mentioned in H&P    PHYSICAL EXAM: VS:  BP 118/70 (BP Location: Right Arm)   Pulse 75    Ht 5' (1.524 m)   Wt 153 lb (69.4 kg)   SpO2 96%   BMI 29.88 kg/m  , BMI Body mass index is 29.88 kg/m. GEN: Well nourished, well developed, in no acute distress  HEENT: normal  Neck: no JVD, carotid bruits, or masses Cardiac:RRR; no murmurs, rubs, or gallops,no edema  Respiratory:  clear to auscultation bilaterally, normal work of breathing GI: soft, nontender, nondistended, + BS MS: no deformity or atrophy  Skin: warm and dry, no rash Neuro:  Strength and sensation are intact Psych: euthymic mood, full affect    Recent Labs: 07/26/2017: ALT 23 08/16/2017: B Natriuretic Peptide 297.0; BUN 23; Creatinine, Ser 1.21; Hemoglobin 12.8; Platelets 172; Potassium 4.4; Sodium 135    Lipid Panel No results found for: CHOL, TRIG, HDL, CHOLHDL, VLDL, LDLCALC, LDLDIRECT    Wt Readings from Last 3 Encounters:  09/06/17 153 lb (69.4 kg)  08/04/17 151 lb (68.5 kg)  07/30/17 152 lb (68.9 kg)      Other studies Reviewed: Other studies Reviewed: Catheterization 07/27/2017 Conclusion     Mid LAD lesion, 30 %stenosed.  Ost 1st Diag to 1st Diag lesion, 40 %stenosed.  Mid LAD to Dist LAD lesion, 20 %stenosed.  There is severe left ventricular systolic dysfunction.  LV end diastolic pressure is moderately elevated.  The left ventricular ejection fraction is 25-35% by visual estimate.  1. Mild nonobstructive coronary artery disease. 2. Severely reduced LV systolic function with an EF of 25-30% with wall motion abnormality consistent with stress-induced cardiomyopathy. 3. Moderately elevated left ventricular end-diastolic pressure.   Echocardiogram 02/11/2016 Left ventricle: The cavity size was normal. Wall thickness was increased in a pattern of mild LVH. The estimated ejection fraction was 55%. Doppler parameters are consistent with abnormal left ventricular relaxation (grade 1 diastolic dysfunction). - Aortic valve: There was mild regurgitation. - Atrial septum: No  defect or patent foramen ovale was identified. - Pericardium, extracardiac: Small pericardial effusion with no tamponade.    Current medicines are reviewed at length with the patient today. Refills are provided for all cardiac medications.    Labs/ tests ordered today include:  1. CAD; No complaints of angina. She is remains active within her own stamina range. She is going to the grocery store and and has recently returned to her own home. No changes in medication regimen.  2. Chronic Diastolic CHF: She is euvolemic. Only uses lasix prn. Has not needed to use over the last month.   3. Hypertension: BP is well controlled. No changes in regimen   Phill Myron. West Pugh, ANP, AACC   09/06/2017 12:59 PM    [facility] [address] [place], [place] [address] Phone: [phone]; Fax: [phone]

## 2017-09-06 ENCOUNTER — Ambulatory Visit (INDEPENDENT_AMBULATORY_CARE_PROVIDER_SITE_OTHER): Payer: Medicare Other | Admitting: Adult Health

## 2017-09-06 ENCOUNTER — Encounter: Payer: Self-pay | Admitting: Adult Health

## 2017-09-06 VITALS — BP 118/70 | HR 75 | Ht 60.0 in | Wt 153.0 lb

## 2017-09-06 DIAGNOSIS — I10 Essential (primary) hypertension: Secondary | ICD-10-CM

## 2017-09-06 DIAGNOSIS — I2510 Atherosclerotic heart disease of native coronary artery without angina pectoris: Secondary | ICD-10-CM | POA: Diagnosis not present

## 2017-09-06 DIAGNOSIS — Z23 Encounter for immunization: Secondary | ICD-10-CM

## 2017-09-06 DIAGNOSIS — I5032 Chronic diastolic (congestive) heart failure: Secondary | ICD-10-CM

## 2017-09-06 MED ORDER — FUROSEMIDE 20 MG PO TABS: 20.0000 mg | ORAL_TABLET | Freq: Every day | ORAL | 3 refills | Status: DC | PRN

## 2017-09-06 MED ORDER — LISINOPRIL 2.5 MG PO TABS: 2.5000 mg | ORAL_TABLET | Freq: Every day | ORAL | 3 refills | Status: DC

## 2017-09-06 MED ORDER — CARVEDILOL 3.125 MG PO TABS: 3.1250 mg | ORAL_TABLET | Freq: Two times a day (BID) | ORAL | 11 refills | Status: DC

## 2017-09-06 NOTE — Patient Instructions (Signed)
Medication Instructions:  Your physician recommends that you continue on your current medications as directed. Please refer to the Current Medication list given to you today.   Labwork: NONE   Testing/Procedures: NONE   Follow-Up: Your physician wants you to follow-up in: 6 Months. You will receive a reminder letter in the mail two months in advance. If you don't receive a letter, please call our office to schedule the follow-up appointment.   Any Other Special Instructions Will Be Listed Below (If Applicable).     If you need a refill on your cardiac medications before your next appointment, please call your pharmacy.  Thank you for choosing [facility]!   

## 2017-09-06 NOTE — Progress Notes (Signed)
Thank you [facility] 

## 2017-09-08 DIAGNOSIS — E1122 Type 2 diabetes mellitus with diabetic chronic kidney disease: Secondary | ICD-10-CM | POA: Diagnosis not present

## 2017-09-08 DIAGNOSIS — I10 Essential (primary) hypertension: Secondary | ICD-10-CM | POA: Diagnosis not present

## 2017-09-13 DIAGNOSIS — E782 Mixed hyperlipidemia: Secondary | ICD-10-CM | POA: Diagnosis not present

## 2017-09-13 DIAGNOSIS — I10 Essential (primary) hypertension: Secondary | ICD-10-CM | POA: Diagnosis not present

## 2017-09-13 DIAGNOSIS — F419 Anxiety disorder, unspecified: Secondary | ICD-10-CM | POA: Diagnosis not present

## 2017-09-13 DIAGNOSIS — D649 Anemia, unspecified: Secondary | ICD-10-CM | POA: Diagnosis not present

## 2017-09-13 DIAGNOSIS — I5181 Takotsubo syndrome: Secondary | ICD-10-CM | POA: Diagnosis not present

## 2017-09-13 DIAGNOSIS — N183 Chronic kidney disease, stage 3 (moderate): Secondary | ICD-10-CM | POA: Diagnosis not present

## 2017-09-13 DIAGNOSIS — I214 Non-ST elevation (NSTEMI) myocardial infarction: Secondary | ICD-10-CM | POA: Diagnosis not present

## 2017-09-13 DIAGNOSIS — Z6830 Body mass index (BMI) 30.0-30.9, adult: Secondary | ICD-10-CM | POA: Diagnosis not present

## 2017-09-13 DIAGNOSIS — E1165 Type 2 diabetes mellitus with hyperglycemia: Secondary | ICD-10-CM | POA: Diagnosis not present

## 2017-09-13 DIAGNOSIS — M6281 Muscle weakness (generalized): Secondary | ICD-10-CM | POA: Diagnosis not present

## 2018-01-16 DIAGNOSIS — E1165 Type 2 diabetes mellitus with hyperglycemia: Secondary | ICD-10-CM | POA: Diagnosis not present

## 2018-01-16 DIAGNOSIS — I10 Essential (primary) hypertension: Secondary | ICD-10-CM | POA: Diagnosis not present

## 2018-01-16 DIAGNOSIS — E782 Mixed hyperlipidemia: Secondary | ICD-10-CM | POA: Diagnosis not present

## 2018-01-16 DIAGNOSIS — N183 Chronic kidney disease, stage 3 (moderate): Secondary | ICD-10-CM | POA: Diagnosis not present

## 2018-01-18 DIAGNOSIS — E875 Hyperkalemia: Secondary | ICD-10-CM | POA: Diagnosis not present

## 2018-01-18 DIAGNOSIS — I10 Essential (primary) hypertension: Secondary | ICD-10-CM | POA: Diagnosis not present

## 2018-01-18 DIAGNOSIS — E782 Mixed hyperlipidemia: Secondary | ICD-10-CM | POA: Diagnosis not present

## 2018-01-18 DIAGNOSIS — D509 Iron deficiency anemia, unspecified: Secondary | ICD-10-CM | POA: Diagnosis not present

## 2018-01-18 DIAGNOSIS — I428 Other cardiomyopathies: Secondary | ICD-10-CM | POA: Diagnosis not present

## 2018-01-18 DIAGNOSIS — M170 Bilateral primary osteoarthritis of knee: Secondary | ICD-10-CM | POA: Diagnosis not present

## 2018-01-18 DIAGNOSIS — N183 Chronic kidney disease, stage 3 (moderate): Secondary | ICD-10-CM | POA: Diagnosis not present

## 2018-02-09 ENCOUNTER — Emergency Department (HOSPITAL_COMMUNITY)
Admission: EM | Admit: 2018-02-09 | Discharge: 2018-02-09 | Disposition: A | Discharge status: Home / Self Care | Payer: Medicare Other | Attending: Emergency Medicine | Admitting: Emergency Medicine

## 2018-02-09 ENCOUNTER — Emergency Department (HOSPITAL_COMMUNITY): Payer: Medicare Other

## 2018-02-09 ENCOUNTER — Other Ambulatory Visit: Payer: Self-pay

## 2018-02-09 ENCOUNTER — Encounter (HOSPITAL_COMMUNITY): Payer: Self-pay | Admitting: Emergency Medicine

## 2018-02-09 DIAGNOSIS — Z7982 Long term (current) use of aspirin: Secondary | ICD-10-CM | POA: Insufficient documentation

## 2018-02-09 DIAGNOSIS — F0390 Unspecified dementia without behavioral disturbance: Secondary | ICD-10-CM | POA: Diagnosis not present

## 2018-02-09 DIAGNOSIS — Z7984 Long term (current) use of oral hypoglycemic drugs: Secondary | ICD-10-CM | POA: Insufficient documentation

## 2018-02-09 DIAGNOSIS — I110 Hypertensive heart disease with heart failure: Secondary | ICD-10-CM | POA: Diagnosis not present

## 2018-02-09 DIAGNOSIS — E119 Type 2 diabetes mellitus without complications: Secondary | ICD-10-CM | POA: Diagnosis not present

## 2018-02-09 DIAGNOSIS — Z79899 Other long term (current) drug therapy: Secondary | ICD-10-CM | POA: Diagnosis not present

## 2018-02-09 DIAGNOSIS — I5032 Chronic diastolic (congestive) heart failure: Secondary | ICD-10-CM | POA: Insufficient documentation

## 2018-02-09 DIAGNOSIS — F419 Anxiety disorder, unspecified: Secondary | ICD-10-CM

## 2018-02-09 DIAGNOSIS — I2510 Atherosclerotic heart disease of native coronary artery without angina pectoris: Secondary | ICD-10-CM | POA: Insufficient documentation

## 2018-02-09 DIAGNOSIS — R0600 Dyspnea, unspecified: Secondary | ICD-10-CM

## 2018-02-09 DIAGNOSIS — R0602 Shortness of breath: Secondary | ICD-10-CM | POA: Diagnosis not present

## 2018-02-09 DIAGNOSIS — Z85828 Personal history of other malignant neoplasm of skin: Secondary | ICD-10-CM | POA: Diagnosis not present

## 2018-02-09 LAB — COMPREHENSIVE METABOLIC PANEL
ALBUMIN: 3.4 g/dL — AB (ref 3.5–5.0)
ALK PHOS: 49 U/L (ref 38–126)
ALT: 120 U/L — ABNORMAL HIGH (ref 14–54)
ANION GAP: 11 (ref 5–15)
AST: 114 U/L — ABNORMAL HIGH (ref 15–41)
BILIRUBIN TOTAL: 0.9 mg/dL (ref 0.3–1.2)
BUN: 33 mg/dL — ABNORMAL HIGH (ref 6–20)
CALCIUM: 8.3 mg/dL — AB (ref 8.9–10.3)
CO2: 23 mmol/L (ref 22–32)
Chloride: 100 mmol/L — ABNORMAL LOW (ref 101–111)
Creatinine, Ser: 1.34 mg/dL — ABNORMAL HIGH (ref 0.44–1.00)
GFR, EST AFRICAN AMERICAN: 40 mL/min — AB (ref >60)
GFR, EST NON AFRICAN AMERICAN: 34 mL/min — AB (ref >60)
GLUCOSE: 163 mg/dL — AB (ref 65–99)
Potassium: 4.1 mmol/L (ref 3.5–5.1)
Sodium: 134 mmol/L — ABNORMAL LOW (ref 135–145)
TOTAL PROTEIN: 6.6 g/dL (ref 6.5–8.1)

## 2018-02-09 LAB — CBC WITH DIFFERENTIAL/PLATELET
Basophils Absolute: 0 10*3/uL (ref 0.0–0.1)
Basophils Relative: 0 %
Eosinophils Absolute: 0 10*3/uL (ref 0.0–0.7)
Eosinophils Relative: 1 %
HEMATOCRIT: 43.7 % (ref 36.0–46.0)
HEMOGLOBIN: 13.8 g/dL (ref 12.0–15.0)
LYMPHS PCT: 8 %
Lymphs Abs: 0.3 10*3/uL — ABNORMAL LOW (ref 0.7–4.0)
MCH: 30.1 pg (ref 26.0–34.0)
MCHC: 31.6 g/dL (ref 30.0–36.0)
MCV: 95.4 fL (ref 78.0–100.0)
MONOS PCT: 10 %
Monocytes Absolute: 0.4 10*3/uL (ref 0.1–1.0)
NEUTROS ABS: 3.4 10*3/uL (ref 1.7–7.7)
NEUTROS PCT: 81 %
Platelets: 143 10*3/uL — ABNORMAL LOW (ref 150–400)
RBC: 4.58 MIL/uL (ref 3.87–5.11)
RDW: 14.3 % (ref 11.5–15.5)
WBC: 4.2 10*3/uL (ref 4.0–10.5)

## 2018-02-09 LAB — BRAIN NATRIURETIC PEPTIDE: B Natriuretic Peptide: 59 pg/mL (ref 0.0–100.0)

## 2018-02-09 LAB — TROPONIN I: Troponin I: <0.03 ng/mL (ref <0.03)

## 2018-02-09 MED ORDER — ALBUTEROL SULFATE (2.5 MG/3ML) 0.083% IN NEBU
5.0000 mg | INHALATION_SOLUTION | Freq: Once | RESPIRATORY_TRACT | Status: AC
  Administered 2018-02-09: 5 mg via RESPIRATORY_TRACT
  Filled 2018-02-09: qty 6

## 2018-02-09 MED ORDER — FUROSEMIDE 10 MG/ML IJ SOLN
40.0000 mg | Freq: Once | INTRAMUSCULAR | Status: AC
  Administered 2018-02-09: 40 mg via INTRAVENOUS
  Filled 2018-02-09: qty 4

## 2018-02-09 NOTE — ED Triage Notes (Signed)
Pt c/o of sob starting 6 am.  HX of CHF.  Denies wearing oxygen at home.

## 2018-02-09 NOTE — ED Provider Notes (Signed)
[facility] EMERGENCY DEPARTMENT Provider Note   CSN: [id number] Arrival date & time: 02/09/18  8299     History   Chief Complaint Chief Complaint  Patient presents with  . Shortness of Breath    HPI Robin Guzman is a 84 y.o. female.  Complaint is shortness of breath  HPI: 84 year old female.  History of CAD, CHF, mild dementia, anxiety dyspnea, hypertension.  She awakened this morning feeling normal.  States that she went into her kitchen.  States she started feeling "closed in".  She went to walk back to her bedroom.  She started feeling short of breath.  States she made it and had to sit down in her living room.  She called her daughter.  Her daughter arrived and felt that the mother was very very anxious.  She was more dyspneic though, and they called 911  On arrival she states her symptoms have "pretty much gone away".  Past Medical History:  Diagnosis Date  . Arthritis   . CAD (coronary artery disease), native coronary artery    07/27/17 nonobstructive, EF 25-35% by LV gram  . CHF (congestive heart failure) ([place])   . Dementia   . Diabetes mellitus without complication ([place])   . Dyslipidemia   . Dyspnea   . Dyspnea on exertion   . History of stress test 07/05/2006   High risk scan cardiac cathe was recommended.  [name] Hx of echocardiogram 04/09/2011   EF 55% Mildly hypertrophic left ventricle with normal systolic function, Moderate (grade II) diastolic dysfunction. Elevated left atrial pressure. No significant valvular abnormalities. No pericardial effusion. Mild to moderate pulmonary arterial hypertension.  . Hypercholesteremia   . Hypertension   . Myocardial infarct ([place])   . Skin cancer    HISTORY OF     Patient Active Problem List   Diagnosis Date Noted  . Acute systolic heart failure ([place]) 07/28/2017  . Sundowning 07/28/2017  . NSTEMI (non-ST elevated myocardial infarction) ([place]) 07/26/2017  . Closed 3-part fracture of proximal humerus, left, with routine  healing, subsequent encounter 12/02/2016  . UTI (urinary tract infection) 12/25/2013  . Dyspnea 12/25/2013  . Near syncope 12/25/2013  . CAD (coronary artery disease) 12/25/2013  . Chronic diastolic heart failure ([place]) 08/07/2013  . Essential hypertension 08/07/2013  . DM2 (diabetes mellitus, type 2) ([place]) 08/07/2013  . Hyperlipidemia 08/07/2013    Past Surgical History:  Procedure Laterality Date  . ABDOMINAL HYSTERECTOMY    . CATARACT EXTRACTION W/PHACO Right 05/20/2017   Procedure: CATARACT EXTRACTION PHACO AND INTRAOCULAR LENS PLACEMENT RIGHT EYE;  Surgeon: Tonny Branch, MD;  Location: [facility];  Service: Ophthalmology;  Laterality: Right;  CDE: 17.77  . CATARACT EXTRACTION W/PHACO Left 06/21/2017   Procedure: CATARACT EXTRACTION PHACO AND INTRAOCULAR LENS PLACEMENT (IOC);  Surgeon: Tonny Branch, MD;  Location: [facility];  Service: Ophthalmology;  Laterality: Left;  CDE: 8.90  . LEFT HEART CATH AND CORONARY ANGIOGRAPHY N/A 07/27/2017   Procedure: LEFT HEART CATH AND CORONARY ANGIOGRAPHY;  Surgeon: Wellington Hampshire, MD;  Location: [place] [facility];  Service: Cardiovascular;  Laterality: N/A;    OB History    Gravida  3   Para  3   Term  3   Preterm      AB      Living        SAB      TAB      Ectopic      Multiple      Live Births  Home Medications    Prior to Admission medications   Medication Sig Start Date End Date Taking? Authorizing Provider  aspirin EC 81 MG tablet Take 81 mg by mouth daily.   Yes [provider]  carvedilol (COREG) 3.125 MG tablet Take 1 tablet (3.125 mg total) by mouth 2 (two) times daily with a meal. 09/06/17  Yes Lendon Colonel, NP  diclofenac sodium (VOLTAREN) 1 % GEL Apply 2 g topically daily as needed (for pain).    Yes [provider]  glipiZIDE (GLUCOTROL) 5 MG tablet Take 5 mg by mouth 2 (two) times daily before a meal.    Yes [provider]  lisinopril (PRINIVIL,ZESTRIL) 2.5 MG  tablet Take 1 tablet (2.5 mg total) by mouth daily. 09/06/17  Yes Lendon Colonel, NP    Family History Family History  Problem Relation Age of Onset  . Cancer Mother   . Heart Problems Father   . Cancer Brother        brain  . Cancer Sister        brain    Social History Social History   Tobacco Use  . Smoking status: Never Smoker  . Smokeless tobacco: Never Used  Substance Use Topics  . Alcohol use: No  . Drug use: No     Allergies   Daypro [oxaprozin] and Advil [ibuprofen]   Review of Systems Review of Systems  Constitutional: Negative for appetite change, chills, diaphoresis, fatigue and fever.  HENT: Negative for mouth sores, sore throat and trouble swallowing.   Eyes: Negative for visual disturbance.  Respiratory: Positive for shortness of breath. Negative for cough, chest tightness and wheezing.   Cardiovascular: Negative for chest pain.  Gastrointestinal: Negative for abdominal distention, abdominal pain, diarrhea, nausea and vomiting.  Endocrine: Negative for polydipsia, polyphagia and polyuria.  Genitourinary: Negative for dysuria, frequency and hematuria.  Musculoskeletal: Negative for gait problem.  Skin: Negative for color change, pallor and rash.  Neurological: Negative for dizziness, syncope, light-headedness and headaches.  Hematological: Does not bruise/bleed easily.  Psychiatric/Behavioral: Negative for behavioral problems and confusion. The patient is nervous/anxious.      Physical Exam Updated Vital Signs BP 106/78   Pulse 85   Temp 98 F (36.7 C) (Oral)   Resp 19   SpO2 97%   Physical Exam  Constitutional: She appears well-developed and well-nourished. No distress.  She appears in no distress.  She states she feels better.  She is well oxygenated.  Afebrile, not hypoxemic.  HENT:  Head: Normocephalic and atraumatic.  Eyes: Conjunctivae are normal.  Neck: Neck supple.  Cardiovascular: Normal rate and regular rhythm.  No murmur  heard. Pulmonary/Chest: Effort normal and breath sounds normal. No respiratory distress.  Clear bilateral breath sounds.  No wheezing rales or rhonchi.  Abdominal: Soft. There is no tenderness.  Musculoskeletal: She exhibits no edema.  Neurological: She is alert.  Skin: Skin is warm and dry.  Psychiatric: She has a normal mood and affect.  Nursing note and vitals reviewed.    ED Treatments / Results  Labs (all labs ordered are listed, but only abnormal results are displayed) Labs Reviewed  CBC WITH DIFFERENTIAL/PLATELET - Abnormal; Notable for the following components:      Result Value   Platelets 143 (*)    Lymphs Abs 0.3 (*)    All other components within normal limits  COMPREHENSIVE METABOLIC PANEL - Abnormal; Notable for the following components:   Sodium 134 (*)    Chloride 100 (*)  Glucose, Bld 163 (*)    BUN 33 (*)    Creatinine, Ser 1.34 (*)    Calcium 8.3 (*)    Albumin 3.4 (*)    AST 114 (*)    ALT 120 (*)    GFR calc non Af Amer 34 (*)    GFR calc Af Amer 40 (*)    All other components within normal limits  BRAIN NATRIURETIC PEPTIDE  TROPONIN I  I-STAT TROPONIN, ED    EKG  EKG Interpretation  Date/Time:  Thursday February 09 2018 09:01:27 EDT Ventricular Rate:  88 PR Interval:    QRS Duration: 131 QT Interval:  397 QTC Calculation: 481 R Axis:   -29 Text Interpretation:  sinus based rhythm  with recurrent undetermined rhythm Intermittent AF vs marked sinus arrythmia Left bundle branch block Confirmed by Tanna Furry [id number]) on 02/09/2018 9:06:36 AM       Radiology Dg Chest 2 View  Result Date: 02/09/2018 CLINICAL DATA:  Shortness of Breath EXAM: CHEST - 2 VIEW COMPARISON:  08/16/2017 FINDINGS: Cardiac shadow is enlarged but stable. Aortic calcifications are again seen. Lungs are clear bilaterally. No acute bony abnormality is noted. Old left humeral fracture is noted. IMPRESSION: No active cardiopulmonary disease. Electronically Signed   By: Inez Catalina M.D.   On: 02/09/2018 09:49    Procedures Procedures (including critical care time)  Medications Ordered in ED Medications  albuterol (PROVENTIL) (2.5 MG/3ML) 0.083% nebulizer solution 5 mg (5 mg Nebulization Given 02/09/18 0952)  furosemide (LASIX) injection 40 mg (40 mg Intravenous Given 02/09/18 1015)     Initial Impression / Assessment and Plan / ED Course  I have reviewed the triage vital signs and the nursing notes.  Pertinent labs & imaging results that were available during my care of the patient were reviewed by me and considered in my medical decision making (see chart for details).   I think her symptoms are in line with anxiety.  No symptoms or findings here to suggest ACS, DVT, PE, CHF.  She is ambulatory here without hypoxemia or symptoms.  Discharged home.  Final Clinical Impressions(s) / ED Diagnoses   Final diagnoses:  Dyspnea, unspecified type  Anxiety    ED Discharge Orders    None       Tanna Furry, MD 02/09/18 1429

## 2018-02-09 NOTE — ED Notes (Signed)
EDP at bedside  

## 2018-02-09 NOTE — Discharge Instructions (Addendum)
Recheck with your physician as needed.  Return to ER as needed for new or worsening.

## 2018-02-09 NOTE — ED Notes (Signed)
RT notified for neb tx. 

## 2018-02-09 NOTE — ED Notes (Signed)
Pt returned from xray

## 2018-03-12 NOTE — Progress Notes (Signed)
Cardiology Office Note   Date:  03/13/2018   ID:  Robin Guzman, DOB 1928/01/06, MRN [id number]  PCP:  Celene Squibb, MD  Cardiologist: Dr. Sallyanne Kuster  Chief Complaint  Patient presents with  . Follow-up    pt states no sx   . Hypertension     History of Present Illness: Robin Guzman is a 84 y.o. female who presents for ongoing assessment and management of coronary artery disease, cardiomyopathy, CHF, and hypertension.  The patient was last seen in the office on 09/06/2017 in [place].  That time the patient was doing well and was without any complaints.  Unfortunately, the patient was seen in the emergency room on 02/09/2018 for complaints of shortness of breath.  She was diagnosed with CHF, with other history of mild dementia anxiety dyspnea and hypertension at the time of ER visit.  The patient was finally diagnosed with anxiety at no lab work or radiology reports demonstrating CHF, ACS, PE or DVT.  She comes today without any complaints.  Her daughter who is with her states that she is not wearing her life alert necklace, nor is she consistently wearing her hearing aids.  The patient has been medically compliant as her daughter does fill her medication tray, and she states she takes her medicine every morning after her breakfast.  She denies chest pain or trouble breathing.  Past Medical History:  Diagnosis Date  . Arthritis   . CAD (coronary artery disease), native coronary artery    07/27/17 nonobstructive, EF 25-35% by LV gram  . CHF (congestive heart failure) ([place])   . Dementia   . Diabetes mellitus without complication ([place])   . Dyslipidemia   . Dyspnea   . Dyspnea on exertion   . History of stress test 07/05/2006   High risk scan cardiac cathe was recommended.  [name] Hx of echocardiogram 04/09/2011   EF 55% Mildly hypertrophic left ventricle with normal systolic function, Moderate (grade II) diastolic dysfunction. Elevated left atrial pressure. No significant valvular  abnormalities. No pericardial effusion. Mild to moderate pulmonary arterial hypertension.  . Hypercholesteremia   . Hypertension   . Myocardial infarct ([place])   . Skin cancer    HISTORY OF     Past Surgical History:  Procedure Laterality Date  . ABDOMINAL HYSTERECTOMY    . CATARACT EXTRACTION W/PHACO Right 05/20/2017   Procedure: CATARACT EXTRACTION PHACO AND INTRAOCULAR LENS PLACEMENT RIGHT EYE;  Surgeon: Tonny Branch, MD;  Location: [facility];  Service: Ophthalmology;  Laterality: Right;  CDE: 17.77  . CATARACT EXTRACTION W/PHACO Left 06/21/2017   Procedure: CATARACT EXTRACTION PHACO AND INTRAOCULAR LENS PLACEMENT (IOC);  Surgeon: Tonny Branch, MD;  Location: [facility];  Service: Ophthalmology;  Laterality: Left;  CDE: 8.90  . LEFT HEART CATH AND CORONARY ANGIOGRAPHY N/A 07/27/2017   Procedure: LEFT HEART CATH AND CORONARY ANGIOGRAPHY;  Surgeon: Wellington Hampshire, MD;  Location: [place] [facility];  Service: Cardiovascular;  Laterality: N/A;     Current Outpatient Medications  Medication Sig Dispense Refill  . aspirin EC 81 MG tablet Take 81 mg by mouth daily.    . carvedilol (COREG) 3.125 MG tablet Take 1 tablet (3.125 mg total) by mouth 2 (two) times daily with a meal. 60 tablet 11  . diclofenac sodium (VOLTAREN) 1 % GEL Apply 2 g topically daily as needed (for pain).     [name] glipiZIDE (GLUCOTROL) 5 MG tablet Take 5 mg by mouth 2 (two) times daily before a meal.     .  lisinopril (PRINIVIL,ZESTRIL) 2.5 MG tablet Take 2 tablets (5 mg total) by mouth daily. 90 tablet 3   No current facility-administered medications for this visit.     Allergies:   Daypro [oxaprozin] and Advil [ibuprofen]    Social History:  The patient  reports that she has never smoked. She has never used smokeless tobacco. She reports that she does not drink alcohol or use drugs.   Family History:  The patient's family history includes Cancer in her brother, mother, and sister; Heart Problems in her father.    ROS: All  other systems are reviewed and negative. Unless otherwise mentioned in H&P    PHYSICAL EXAM: VS:  BP (!) 168/82   Pulse 75   Ht 5' (1.524 m)   Wt 155 lb 6.4 oz (70.5 kg)   BMI 30.35 kg/m  , BMI Body mass index is 30.35 kg/m. GEN: Well nourished, well developed, in no acute distress  HEENT: normal  Neck: no JVD, carotid bruits, or masses Cardiac: RRR; no murmurs, rubs, or gallops,no edema  Respiratory:  Clear to auscultation bilaterally, normal work of breathing GI: soft, nontender, nondistended, + BS MS: no deformity or atrophy  Skin: warm and dry, no rash Neuro:  Strength and sensation are intact Psych: euthymic mood, full affect   EKG:  Not completed today in the office.   Recent Labs: 02/09/2018: ALT 120; B Natriuretic Peptide 59.0; BUN 33; Creatinine, Ser 1.34; Hemoglobin 13.8; Platelets 143; Potassium 4.1; Sodium 134    Lipid Panel No results found for: CHOL, TRIG, HDL, CHOLHDL, VLDL, LDLCALC, LDLDIRECT    Wt Readings from Last 3 Encounters:  03/13/18 155 lb 6.4 oz (70.5 kg)  09/06/17 153 lb (69.4 kg)  08/04/17 151 lb (68.5 kg)      Other studies Reviewed: Left ventricle: The cavity size was normal. Wall thickness was   increased in a pattern of mild LVH. The estimated ejection   fraction was 55%. Doppler parameters are consistent with abnormal   left ventricular relaxation (grade 1 diastolic dysfunction). - Aortic valve: There was mild regurgitation. - Atrial septum: No defect or patent foramen ovale was identified. - Pericardium, extracardiac: Small pericardial effusion with no   tamponade.  Cardiac Cath Conclusion     Mid LAD lesion, 30 %stenosed.  Ost 1st Diag to 1st Diag lesion, 40 %stenosed.  Mid LAD to Dist LAD lesion, 20 %stenosed.  There is severe left ventricular systolic dysfunction.  LV end diastolic pressure is moderately elevated.  The left ventricular ejection fraction is 25-35% by visual estimate.   1. Mild nonobstructive  coronary artery disease. 2. Severely reduced LV systolic function with an EF of 25-30% with wall motion abnormality consistent with stress-induced cardiomyopathy. 3. Moderately elevated left ventricular end-diastolic pressure.  Recommendations: Recommend medical therapy for stress-induced cardiomyopathy. I resumed small dose carvedilol and ACE inhibitor. If renal function is stable, consider gentle diuresis. Avoid catheterization via the right radial artery in the future due to significant right radial loop that could not be straightened out with the catheter.      ASSESSMENT AND PLAN:  1.  Hypertension: Blood pressure is not well controlled today.  I have repeated the blood pressure in clinic today myself and she still remains elevated at 176/98.  My plan is to increase her lisinopril to 5 mg daily.  He may need to consider increasing carvedilol and next office visit.  She is going to come back in a week to have her blood pressure checked and  review her medication.  BMET will be completed on that follow-up.  2.  Stress-induced cardiomyopathy: Continue medical management with beta blockers and reassurance.  I have reminded her to please wear her life alert necklace at all times as she has trouble breathing and is easily anxious.  3.  Chronic diastolic heart failure: No evidence of fluid overload.  Current medicines are reviewed at length with the patient today.    Labs/ tests ordered today include: None   Phill Myron. West Pugh, ANP, AACC   03/13/2018 10:46 AM    [place]  [address] [place] [employer] [place] [employer] [place], [place] [address] Phone: [phone]; Fax: [phone]

## 2018-03-13 ENCOUNTER — Encounter: Payer: Self-pay | Admitting: Adult Health

## 2018-03-13 ENCOUNTER — Ambulatory Visit (INDEPENDENT_AMBULATORY_CARE_PROVIDER_SITE_OTHER): Payer: Medicare Other | Admitting: Adult Health

## 2018-03-13 VITALS — BP 168/82 | HR 75 | Ht 60.0 in | Wt 155.4 lb

## 2018-03-13 DIAGNOSIS — I5032 Chronic diastolic (congestive) heart failure: Secondary | ICD-10-CM | POA: Diagnosis not present

## 2018-03-13 DIAGNOSIS — Z79899 Other long term (current) drug therapy: Secondary | ICD-10-CM

## 2018-03-13 DIAGNOSIS — I10 Essential (primary) hypertension: Secondary | ICD-10-CM

## 2018-03-13 DIAGNOSIS — I43 Cardiomyopathy in diseases classified elsewhere: Secondary | ICD-10-CM | POA: Diagnosis not present

## 2018-03-13 MED ORDER — LISINOPRIL 2.5 MG PO TABS: 5.0000 mg | ORAL_TABLET | Freq: Every day | ORAL | 3 refills | Status: DC

## 2018-03-13 NOTE — Patient Instructions (Signed)
Medication Instructions:  INCREASE LISINOPRIL 5MG  DAILY  If you need a refill on your cardiac medications before your next appointment, please call your pharmacy.  Labwork: BMET IN 1 WEEK HERE IN OUR OFFICE AT [facility]  Take the provided lab slips with you to the lab for your blood draw.   You will NOT need to fast   Special Instructions: BP CHECK WITH PHARMACIST IN 1 WEEK  Follow-Up: Your physician wants you to follow-up in: [place].   Thank you for choosing [facility] at [facility]!!

## 2018-03-22 ENCOUNTER — Ambulatory Visit (INDEPENDENT_AMBULATORY_CARE_PROVIDER_SITE_OTHER): Payer: Medicare Other | Admitting: Pharmacist

## 2018-03-22 VITALS — BP 172/90 | HR 78

## 2018-03-22 DIAGNOSIS — I10 Essential (primary) hypertension: Secondary | ICD-10-CM

## 2018-03-22 MED ORDER — CARVEDILOL 6.25 MG PO TABS: 6.2500 mg | ORAL_TABLET | Freq: Two times a day (BID) | ORAL | 1 refills | Status: DC

## 2018-03-22 NOTE — Patient Instructions (Signed)
Return for a follow up appointment in 4 weeks with Dr Sallyanne Kuster  Check your blood pressure at home daily (if able) and keep record of the readings.  Take your BP meds as follows:   Morning   INCREASE carvedilol dose to 6.25mg     TAKE lisinopril 5mg  (2 tablets of 2.5mg )   Evening   INCREASE carvedilol dose to 6.25mg       Bring all of your meds, your BP cuff and your record of home blood pressures to your next appointment.  Exercise as you're able, try to walk approximately 30 minutes per day.  Keep salt intake to a minimum, especially watch canned and prepared boxed foods.  Eat more fresh fruits and vegetables and fewer canned items.  Avoid eating in fast food restaurants.    HOW TO TAKE YOUR BLOOD PRESSURE: . Rest 5 minutes before taking your blood pressure. .  Don't smoke or drink caffeinated beverages for at least 30 minutes before. . Take your blood pressure before (not after) you eat. . Sit comfortably with your back supported and both feet on the floor (don't cross your legs). . Elevate your arm to heart level on a table or a desk. . Use the proper sized cuff. It should fit smoothly and snugly around your bare upper arm. There should be enough room to slip a fingertip under the cuff. The bottom edge of the cuff should be 1 inch above the crease of the elbow. . Ideally, take 3 measurements at one sitting and record the average.

## 2018-03-22 NOTE — Progress Notes (Signed)
Patient ID: CORTLYN CANNELL                 DOB: September 03, 1928                      MRN: [id number]     HPI: Robin Guzman is a 84 y.o. female patient of Dr Sallyanne Kuster referred to HTN clinic by Vilinda Flake NP. PMH includes uncontrolled hypertension, CAD s/p MI, HF, dementia, dyslipidemia, and diabetes. During most recent OV on 03/13/18, her BP was found to be 176/98 and lisinopril dose was increased fom 2.5mg  daily 5mg  daily. Patient presents today for BP monitoring and medication titration. Patient denies problems with current therapy, denies dizziness, swelling, or headaches.   Current HTN meds:  Carvedilol 3.125mg  twice daily Lisinopril 5mg  daily  BP goal: 130/80 if able to tolerate (140/90 acceptable for age and comorbidities)  Family History: The patient's family history includes Cancer in her brother, mother, and sister; Heart Problems in her father.  Social History: The patient  reports that she has never smoked. She has never used smokeless tobacco. She reports that she does not drink alcohol or use drugs  Exercise: only activities of daily living. Complete most housework by herself  Home BP readings: none available for assessment  Wt Readings from Last 3 Encounters:  03/13/18 155 lb 6.4 oz (70.5 kg)  09/06/17 153 lb (69.4 kg)  08/04/17 151 lb (68.5 kg)   BP Readings from Last 3 Encounters:  03/22/18 (!) 172/90  03/13/18 (!) 168/82  02/09/18 106/78   Pulse Readings from Last 3 Encounters:  03/22/18 78  03/13/18 75  02/09/18 85    Past Medical History:  Diagnosis Date  . Arthritis   . CAD (coronary artery disease), native coronary artery    07/27/17 nonobstructive, EF 25-35% by LV gram  . CHF (congestive heart failure) ([place])   . Dementia   . Diabetes mellitus without complication ([place])   . Dyslipidemia   . Dyspnea   . Dyspnea on exertion   . History of stress test 07/05/2006   High risk scan cardiac cathe was recommended.  [name] Hx of echocardiogram 04/09/2011   EF 55% Mildly hypertrophic left ventricle with normal systolic function, Moderate (grade II) diastolic dysfunction. Elevated left atrial pressure. No significant valvular abnormalities. No pericardial effusion. Mild to moderate pulmonary arterial hypertension.  . Hypercholesteremia   . Hypertension   . Myocardial infarct ([place])   . Skin cancer    HISTORY OF     Current Outpatient Medications on File Prior to Visit  Medication Sig Dispense Refill  . aspirin EC 81 MG tablet Take 81 mg by mouth daily.    . diclofenac sodium (VOLTAREN) 1 % GEL Apply 2 g topically daily as needed (for pain).     [name] glipiZIDE (GLUCOTROL) 5 MG tablet Take 5 mg by mouth 2 (two) times daily before a meal.     . lisinopril (PRINIVIL,ZESTRIL) 2.5 MG tablet Take 2 tablets (5 mg total) by mouth daily. 90 tablet 3   No current facility-administered medications on file prior to visit.     Allergies  Allergen Reactions  . Daypro [Oxaprozin] Shortness Of Breath and Other (See Comments)    weakness  . Advil [Ibuprofen] Other (See Comments)    weakness    Blood pressure (!) 172/90, pulse 78, SpO2 98 %.  Essential hypertension Blood pressure remains elevated above goal of 130/80 and unchanged since lisinopril dose was increased to 5mg   daily. Will repeat BMET today to re-assess renal function after ACEi dose increased. Noted HR remains above 70bpm; therefore will increase carvedilol to 6.25mg  twice daily and follow up in 4 weeks. Plan to titrate lisinopril further if renal function remains appropriate.   Valon Glasscock Rodriguez-Guzman PharmD, BCPS, [place] [address] 03/23/2018 11:51 AM

## 2018-03-23 ENCOUNTER — Encounter: Payer: Self-pay | Admitting: Pharmacist

## 2018-03-23 LAB — BASIC METABOLIC PANEL
BUN / CREAT RATIO: 20 (ref 12–28)
BUN: 25 mg/dL (ref 8–27)
CALCIUM: 9.7 mg/dL (ref 8.7–10.3)
CHLORIDE: 102 mmol/L (ref 96–106)
CO2: 25 mmol/L (ref 20–29)
CREATININE: 1.23 mg/dL — AB (ref 0.57–1.00)
GFR calc Af Amer: 45 mL/min/{1.73_m2} — ABNORMAL LOW (ref >59)
GFR calc non Af Amer: 39 mL/min/{1.73_m2} — ABNORMAL LOW (ref >59)
GLUCOSE: 95 mg/dL (ref 65–99)
Potassium: 4.9 mmol/L (ref 3.5–5.2)
Sodium: 142 mmol/L (ref 134–144)

## 2018-03-23 NOTE — Progress Notes (Signed)
Thank you [facility] 

## 2018-03-23 NOTE — Assessment & Plan Note (Addendum)
Blood pressure remains elevated above goal of 130/80 and unchanged since lisinopril dose was increased to 5mg  daily. Will repeat BMET today to re-assess renal function after ACEi dose increased. Noted HR remains above 70bpm; therefore will increase carvedilol to 6.25mg  twice daily and follow up in 4 weeks. Plan to titrate lisinopril further if renal function remains appropriate.

## 2018-04-16 ENCOUNTER — Emergency Department (HOSPITAL_COMMUNITY): Payer: Medicare Other

## 2018-04-16 ENCOUNTER — Encounter (HOSPITAL_COMMUNITY): Payer: Self-pay | Admitting: Emergency Medicine

## 2018-04-16 ENCOUNTER — Emergency Department (HOSPITAL_COMMUNITY)
Admission: EM | Admit: 2018-04-16 | Discharge: 2018-04-16 | Disposition: A | Discharge status: Home / Self Care | Payer: Medicare Other | Attending: Emergency Medicine | Admitting: Emergency Medicine

## 2018-04-16 DIAGNOSIS — I252 Old myocardial infarction: Secondary | ICD-10-CM | POA: Insufficient documentation

## 2018-04-16 DIAGNOSIS — I471 Supraventricular tachycardia: Secondary | ICD-10-CM | POA: Diagnosis not present

## 2018-04-16 DIAGNOSIS — I110 Hypertensive heart disease with heart failure: Secondary | ICD-10-CM | POA: Diagnosis not present

## 2018-04-16 DIAGNOSIS — I2510 Atherosclerotic heart disease of native coronary artery without angina pectoris: Secondary | ICD-10-CM | POA: Insufficient documentation

## 2018-04-16 DIAGNOSIS — I5032 Chronic diastolic (congestive) heart failure: Secondary | ICD-10-CM | POA: Diagnosis not present

## 2018-04-16 DIAGNOSIS — E119 Type 2 diabetes mellitus without complications: Secondary | ICD-10-CM | POA: Diagnosis not present

## 2018-04-16 DIAGNOSIS — Z79899 Other long term (current) drug therapy: Secondary | ICD-10-CM | POA: Insufficient documentation

## 2018-04-16 DIAGNOSIS — Z7984 Long term (current) use of oral hypoglycemic drugs: Secondary | ICD-10-CM | POA: Diagnosis not present

## 2018-04-16 DIAGNOSIS — R0682 Tachypnea, not elsewhere classified: Secondary | ICD-10-CM | POA: Diagnosis not present

## 2018-04-16 DIAGNOSIS — F0390 Unspecified dementia without behavioral disturbance: Secondary | ICD-10-CM | POA: Diagnosis not present

## 2018-04-16 DIAGNOSIS — R002 Palpitations: Secondary | ICD-10-CM | POA: Diagnosis not present

## 2018-04-16 DIAGNOSIS — Z85828 Personal history of other malignant neoplasm of skin: Secondary | ICD-10-CM | POA: Insufficient documentation

## 2018-04-16 DIAGNOSIS — Z7982 Long term (current) use of aspirin: Secondary | ICD-10-CM | POA: Diagnosis not present

## 2018-04-16 DIAGNOSIS — R0602 Shortness of breath: Secondary | ICD-10-CM | POA: Diagnosis not present

## 2018-04-16 LAB — COMPREHENSIVE METABOLIC PANEL
ALBUMIN: 3.4 g/dL — AB (ref 3.5–5.0)
ALT: 46 U/L (ref 14–54)
AST: 54 U/L — AB (ref 15–41)
Alkaline Phosphatase: 47 U/L (ref 38–126)
Anion gap: 8 (ref 5–15)
BUN: 24 mg/dL — AB (ref 6–20)
CO2: 26 mmol/L (ref 22–32)
CREATININE: 1.27 mg/dL — AB (ref 0.44–1.00)
Calcium: 8.7 mg/dL — ABNORMAL LOW (ref 8.9–10.3)
Chloride: 103 mmol/L (ref 101–111)
GFR calc Af Amer: 42 mL/min — ABNORMAL LOW (ref >60)
GFR calc non Af Amer: 37 mL/min — ABNORMAL LOW (ref >60)
Glucose, Bld: 215 mg/dL — ABNORMAL HIGH (ref 65–99)
Potassium: 3.9 mmol/L (ref 3.5–5.1)
SODIUM: 137 mmol/L (ref 135–145)
Total Bilirubin: 0.8 mg/dL (ref 0.3–1.2)
Total Protein: 6.3 g/dL — ABNORMAL LOW (ref 6.5–8.1)

## 2018-04-16 LAB — CBC WITH DIFFERENTIAL/PLATELET
Basophils Absolute: 0 10*3/uL (ref 0.0–0.1)
Basophils Relative: 0 %
Eosinophils Absolute: 0.1 10*3/uL (ref 0.0–0.7)
Eosinophils Relative: 2 %
HEMATOCRIT: 37.9 % (ref 36.0–46.0)
HEMOGLOBIN: 12 g/dL (ref 12.0–15.0)
LYMPHS ABS: 0.7 10*3/uL (ref 0.7–4.0)
Lymphocytes Relative: 10 %
MCH: 30.2 pg (ref 26.0–34.0)
MCHC: 31.7 g/dL (ref 30.0–36.0)
MCV: 95.2 fL (ref 78.0–100.0)
MONO ABS: 0.5 10*3/uL (ref 0.1–1.0)
MONOS PCT: 7 %
NEUTROS ABS: 5.2 10*3/uL (ref 1.7–7.7)
NEUTROS PCT: 81 %
Platelets: 150 10*3/uL (ref 150–400)
RBC: 3.98 MIL/uL (ref 3.87–5.11)
RDW: 14 % (ref 11.5–15.5)
WBC: 6.5 10*3/uL (ref 4.0–10.5)

## 2018-04-16 LAB — TROPONIN I

## 2018-04-16 NOTE — ED Triage Notes (Signed)
Pt reports going to church this morning feeling fine, came home and laid down to rest.  Began feeling like "breath was short".  Denies complaint currently other than stating she just does not feel well.

## 2018-04-16 NOTE — ED Provider Notes (Signed)
[facility] EMERGENCY DEPARTMENT Provider Note   CSN: [id number] Arrival date & time: 04/16/18  1553     History   Chief Complaint Chief Complaint  Patient presents with  . Shortness of Breath    HPI Robin Guzman is a 84 y.o. female.  Patient had an episode today where she was feeling short of breath.  When she arrived in the emergency department she had a heart rate of 140 but then it quickly changed back down to the in the 70s.  This is fairly happened to her several times according to her daughter.  The history is provided by the patient and a relative. No language interpreter was used.  Shortness of Breath  This is a new problem. The problem occurs continuously.The current episode started less than 1 hour ago. The problem has not changed since onset.Pertinent negatives include no fever, no headaches, no cough, no chest pain, no abdominal pain and no rash. It is unknown what precipitated the problem. Risk factors: History of SVT. She has tried nothing for the symptoms. The treatment provided significant relief. She has had prior hospitalizations. She has had prior ED visits. Associated medical issues do not include asthma.    Past Medical History:  Diagnosis Date  . Arthritis   . CAD (coronary artery disease), native coronary artery    07/27/17 nonobstructive, EF 25-35% by LV gram  . CHF (congestive heart failure) ([place])   . Dementia   . Diabetes mellitus without complication ([place])   . Dyslipidemia   . Dyspnea   . Dyspnea on exertion   . History of stress test 07/05/2006   High risk scan cardiac cathe was recommended.  [name] Hx of echocardiogram 04/09/2011   EF 55% Mildly hypertrophic left ventricle with normal systolic function, Moderate (grade II) diastolic dysfunction. Elevated left atrial pressure. No significant valvular abnormalities. No pericardial effusion. Mild to moderate pulmonary arterial hypertension.  . Hypercholesteremia   . Hypertension   . Myocardial  infarct ([place])   . Skin cancer    HISTORY OF     Patient Active Problem List   Diagnosis Date Noted  . Acute systolic heart failure ([place]) 07/28/2017  . Sundowning 07/28/2017  . NSTEMI (non-ST elevated myocardial infarction) ([place]) 07/26/2017  . Closed 3-part fracture of proximal humerus, left, with routine healing, subsequent encounter 12/02/2016  . UTI (urinary tract infection) 12/25/2013  . Dyspnea 12/25/2013  . Near syncope 12/25/2013  . CAD (coronary artery disease) 12/25/2013  . Chronic diastolic heart failure ([place]) 08/07/2013  . Essential hypertension 08/07/2013  . DM2 (diabetes mellitus, type 2) ([place]) 08/07/2013  . Hyperlipidemia 08/07/2013    Past Surgical History:  Procedure Laterality Date  . ABDOMINAL HYSTERECTOMY    . CATARACT EXTRACTION W/PHACO Right 05/20/2017   Procedure: CATARACT EXTRACTION PHACO AND INTRAOCULAR LENS PLACEMENT RIGHT EYE;  Surgeon: Tonny Branch, MD;  Location: [facility];  Service: Ophthalmology;  Laterality: Right;  CDE: 17.77  . CATARACT EXTRACTION W/PHACO Left 06/21/2017   Procedure: CATARACT EXTRACTION PHACO AND INTRAOCULAR LENS PLACEMENT (IOC);  Surgeon: Tonny Branch, MD;  Location: [facility];  Service: Ophthalmology;  Laterality: Left;  CDE: 8.90  . LEFT HEART CATH AND CORONARY ANGIOGRAPHY N/A 07/27/2017   Procedure: LEFT HEART CATH AND CORONARY ANGIOGRAPHY;  Surgeon: Wellington Hampshire, MD;  Location: [facility];  Service: Cardiovascular;  Laterality: N/A;     OB History    Gravida  3   Para  3   Term  3  Preterm      AB      Living        SAB      TAB      Ectopic      Multiple      Live Births               Home Medications    Prior to Admission medications   Medication Sig Start Date End Date Taking? Authorizing Provider  aspirin EC 81 MG tablet Take 81 mg by mouth daily.   Yes [provider]  carvedilol (COREG) 6.25 MG tablet Take 1 tablet (6.25 mg total) by mouth 2 (two) times daily with a meal. Take  place of carvedilol 3.125mg  03/22/18  Yes Croitoru, Mihai, MD  glipiZIDE (GLUCOTROL) 5 MG tablet Take 5 mg by mouth 2 (two) times daily before a meal.    Yes [provider]  lisinopril (PRINIVIL,ZESTRIL) 2.5 MG tablet Take 2 tablets (5 mg total) by mouth daily. 03/13/18  Yes Lendon Colonel, NP    Family History Family History  Problem Relation Age of Onset  . Cancer Mother   . Heart Problems Father   . Cancer Brother        brain  . Cancer Sister        brain    Social History Social History   Tobacco Use  . Smoking status: Never Smoker  . Smokeless tobacco: Never Used  Substance Use Topics  . Alcohol use: No  . Drug use: No     Allergies   Daypro [oxaprozin] and Advil [ibuprofen]   Review of Systems Review of Systems  Constitutional: Negative for appetite change, fatigue and fever.  HENT: Negative for congestion, ear discharge and sinus pressure.   Eyes: Negative for discharge.  Respiratory: Positive for shortness of breath. Negative for cough.   Cardiovascular: Positive for palpitations. Negative for chest pain.  Gastrointestinal: Negative for abdominal pain and diarrhea.  Genitourinary: Negative for frequency and hematuria.  Musculoskeletal: Negative for back pain.  Skin: Negative for rash.  Neurological: Negative for seizures and headaches.  Psychiatric/Behavioral: Negative for hallucinations.     Physical Exam Updated Vital Signs BP 134/76   Pulse 73   Temp 98.1 F (36.7 C) (Oral)   Resp (!) 24   Ht 5' (1.524 m)   Wt 70.8 kg (156 lb)   SpO2 96%   BMI 30.47 kg/m   Physical Exam  Constitutional: She is oriented to person, place, and time. She appears well-developed.  HENT:  Head: Normocephalic.  Eyes: Conjunctivae and EOM are normal. No scleral icterus.  Neck: Neck supple. No thyromegaly present.  Cardiovascular: Exam reveals no gallop and no friction rub.  No murmur heard. Rapid regular rate  Pulmonary/Chest: No stridor. She has no  wheezes. She has no rales. She exhibits no tenderness.  Abdominal: She exhibits no distension. There is no tenderness. There is no rebound.  Musculoskeletal: Normal range of motion. She exhibits no edema.  Lymphadenopathy:    She has no cervical adenopathy.  Neurological: She is oriented to person, place, and time. She exhibits normal muscle tone. Coordination normal.  Skin: No rash noted. No erythema.  Psychiatric: She has a normal mood and affect. Her behavior is normal.     ED Treatments / Results  Labs (all labs ordered are listed, but only abnormal results are displayed) Labs Reviewed  COMPREHENSIVE METABOLIC PANEL - Abnormal; Notable for the following components:      Result  Value   Glucose, Bld 215 (*)    BUN 24 (*)    Creatinine, Ser 1.27 (*)    Calcium 8.7 (*)    Total Protein 6.3 (*)    Albumin 3.4 (*)    AST 54 (*)    GFR calc non Af Amer 37 (*)    GFR calc Af Amer 42 (*)    All other components within normal limits  CBC WITH DIFFERENTIAL/PLATELET  TROPONIN I  TROPONIN I    EKG None  Radiology Dg Chest 2 View  Result Date: 04/16/2018 CLINICAL DATA:  Shortness of breath EXAM: CHEST - 2 VIEW COMPARISON:  February 09, 2018 FINDINGS: There is no edema or consolidation. The heart size and pulmonary vascularity are normal. No adenopathy. There is aortic atherosclerosis. There is degenerative change in the thoracic spine and in each shoulder. IMPRESSION: No edema or consolidation.  There is aortic atherosclerosis. Aortic Atherosclerosis (ICD10-I70.0). Electronically Signed   By: Lowella Grip III M.D.   On: 04/16/2018 16:50    Procedures Procedures (including critical care time)  Medications Ordered in ED Medications - No data to display   Initial Impression / Assessment and Plan / ED Course  I have reviewed the triage vital signs and the nursing notes.  Pertinent labs & imaging results that were available during my care of the patient were reviewed by me and  considered in my medical decision making (see chart for details).    CBC chemistries unremarkable except for elevated glucose.  Troponin x2 is negative X-ray x-ray unremarkable EKG shows normal sinus rhythm rate around 70.  Initially her rate was 140.  Patient was instructed if it starts again to take an extra Coreg and come to the hospital.  She is to follow-up with her cardiologist next week and discuss this episode with him.  According to the daughter this is happened several times Final Clinical Impressions(s) / ED Diagnoses   Final diagnoses:  SVT (supraventricular tachycardia) [facility])    ED Discharge Orders    None       Milton Ferguson, MD 04/16/18 2052

## 2018-04-16 NOTE — Discharge Instructions (Addendum)
If you have an episode where your heart beats fast again you should go ahead and take an extra 1 of your Coreg 6.25 mg tablets.  Then come directly to the emergency department.  When he see a cardiologist this week make sure they know that you had an episode of a fast heart rate and came to the emergency department

## 2018-04-16 NOTE — ED Notes (Signed)
Pt arrived to ED with initial SVT on monitor.  While conversing with pt, noted to convert to SR.

## 2018-04-21 ENCOUNTER — Encounter: Payer: Self-pay | Admitting: Cardiovascular Disease

## 2018-04-21 ENCOUNTER — Ambulatory Visit (INDEPENDENT_AMBULATORY_CARE_PROVIDER_SITE_OTHER): Payer: Medicare Other | Admitting: Cardiovascular Disease

## 2018-04-21 VITALS — BP 158/82 | HR 82 | Ht 60.0 in | Wt 154.0 lb

## 2018-04-21 DIAGNOSIS — E1159 Type 2 diabetes mellitus with other circulatory complications: Secondary | ICD-10-CM | POA: Diagnosis not present

## 2018-04-21 DIAGNOSIS — I447 Left bundle-branch block, unspecified: Secondary | ICD-10-CM

## 2018-04-21 DIAGNOSIS — I10 Essential (primary) hypertension: Secondary | ICD-10-CM | POA: Diagnosis not present

## 2018-04-21 DIAGNOSIS — I5032 Chronic diastolic (congestive) heart failure: Secondary | ICD-10-CM | POA: Diagnosis not present

## 2018-04-21 DIAGNOSIS — I2510 Atherosclerotic heart disease of native coronary artery without angina pectoris: Secondary | ICD-10-CM

## 2018-04-21 DIAGNOSIS — I700 Atherosclerosis of aorta: Secondary | ICD-10-CM | POA: Diagnosis not present

## 2018-04-21 DIAGNOSIS — I471 Supraventricular tachycardia: Secondary | ICD-10-CM | POA: Diagnosis not present

## 2018-04-21 DIAGNOSIS — E785 Hyperlipidemia, unspecified: Secondary | ICD-10-CM

## 2018-04-21 MED ORDER — LISINOPRIL 2.5 MG PO TABS: 2.5000 mg | ORAL_TABLET | Freq: Two times a day (BID) | ORAL | 3 refills | Status: DC

## 2018-04-21 MED ORDER — LISINOPRIL 10 MG PO TABS: 10.0000 mg | ORAL_TABLET | Freq: Every day | ORAL | 3 refills | Status: DC

## 2018-04-21 MED ORDER — CARVEDILOL 12.5 MG PO TABS: 12.5000 mg | ORAL_TABLET | Freq: Two times a day (BID) | ORAL | 3 refills | Status: DC

## 2018-04-21 NOTE — Progress Notes (Signed)
Patient ID: Robin Guzman, female   DOB: [birth year], 84 y.o.   MRN: [id number]     Cardiology Office Note   Date:  04/23/2018   ID:  Robin Guzman, DOB [birth year], MRN [id number]  PCP:  Celene Squibb, MD  Cardiologist:   Sanda Klein, MD   Chief Complaint  Patient presents with  . Follow-up    1 month      History of Present Illness: Robin Guzman is a 84 y.o. female who presents for follow up for CAD and HFPEF with background HTN, DM and hyperlipidemia and LBBB. She had a normal nuclear study in February 2015. Echo in March 2017 was showed LVH, normal LVEF and diastolic dysfunction. Her event monitor showed only PACs and a brief 10-beat run of atrial tachycardia.  More recently, the problem has been BP control. She has had episodes of sudden shortness of breath associated with recent infection with his eyes often initiate montelukast and that exam positive he had a severe cannot bilateral intervention is a bilateral canal without any orthotic he has been having met and has been a problem for the most recent that has bothered with his global "anxiety", some of which have led to ER visits. On 04/16/2018, her heart rate was around 140 bpm on admission, but was 70 bpm by the time she had an ECG.  The telemetry strip shows abrupt onset of tachycardia, likely PAT. The 12 lead ECG shows NSR.  Her labs and CXR were normal and she was discharged from the emergency room.  Her blood pressure when she first checked it was 158/82 when I rechecked it 15 minutes later was even higher at 176/88.  Heart rate at home is typically in the 60s and 70s.  She has no cardiac complaints on most days.  She denies angina.  She is not aware of palpitations.. She develops dyspnea only "if she rushes".  Her memory is deteriorating.  Her granddaughters help her with her medications and will not let her drive anymore.    Past Medical History:  Diagnosis Date  . Arthritis   . CAD (coronary artery disease),  native coronary artery    07/27/17 nonobstructive, EF 25-35% by LV gram  . CHF (congestive heart failure) ([place])   . Dementia   . Diabetes mellitus without complication ([place])   . Dyslipidemia   . Dyspnea   . Dyspnea on exertion   . History of stress test 07/05/2006   High risk scan cardiac cathe was recommended.  [name] Hx of echocardiogram 04/09/2011   EF 55% Mildly hypertrophic left ventricle with normal systolic function, Moderate (grade II) diastolic dysfunction. Elevated left atrial pressure. No significant valvular abnormalities. No pericardial effusion. Mild to moderate pulmonary arterial hypertension.  . Hypercholesteremia   . Hypertension   . Myocardial infarct ([place])   . Skin cancer    HISTORY OF     Past Surgical History:  Procedure Laterality Date  . ABDOMINAL HYSTERECTOMY    . CATARACT EXTRACTION W/PHACO Right 05/20/2017   Procedure: CATARACT EXTRACTION PHACO AND INTRAOCULAR LENS PLACEMENT RIGHT EYE;  Surgeon: Tonny Branch, MD;  Location: [facility];  Service: Ophthalmology;  Laterality: Right;  CDE: 17.77  . CATARACT EXTRACTION W/PHACO Left 06/21/2017   Procedure: CATARACT EXTRACTION PHACO AND INTRAOCULAR LENS PLACEMENT (IOC);  Surgeon: Tonny Branch, MD;  Location: [facility];  Service: Ophthalmology;  Laterality: Left;  CDE: 8.90  . LEFT HEART CATH AND CORONARY ANGIOGRAPHY N/A 07/27/2017   Procedure: LEFT HEART  CATH AND CORONARY ANGIOGRAPHY;  Surgeon: Wellington Hampshire, MD;  Location: [facility];  Service: Cardiovascular;  Laterality: N/A;     Current Outpatient Medications  Medication Sig Dispense Refill  . aspirin EC 81 MG tablet Take 81 mg by mouth daily.    . carvedilol (COREG) 12.5 MG tablet Take 1 tablet (12.5 mg total) by mouth 2 (two) times daily with a meal. 180 tablet 3  . glipiZIDE (GLUCOTROL) 5 MG tablet Take 5 mg by mouth 2 (two) times daily before a meal.     . lisinopril (PRINIVIL,ZESTRIL) 2.5 MG tablet Take 1 tablet (2.5 mg total) by mouth 2 (two) times daily. 180  tablet 3   No current facility-administered medications for this visit.     Allergies:   Daypro [oxaprozin] and Advil [ibuprofen]    Social History:  The patient  reports that she has never smoked. She has never used smokeless tobacco. She reports that she does not drink alcohol or use drugs.   Family History:  The patient's family history includes Cancer in her brother, mother, and sister; Heart Problems in her father.    ROS:  Please see the history of present illness.    Otherwise, review of systems positive for none.   All other systems are reviewed and negative.    PHYSICAL EXAM: VS:  BP (!) 158/82 (BP Location: Left Arm, Patient Position: Sitting, Cuff Size: Normal)   Pulse 82   Ht 5' (1.524 m)   Wt 154 lb (69.9 kg)   BMI 30.08 kg/m  , BMI Body mass index is 30.08 kg/m.   General: Alert, oriented x3, no distress, appears well Head: no evidence of trauma, PERRL, EOMI, no exophtalmos or lid lag, no myxedema, no xanthelasma; normal ears, nose and oropharynx Neck: normal jugular venous pulsations and no hepatojugular reflux; brisk carotid pulses without delay and no carotid bruits Chest: clear to auscultation, no signs of consolidation by percussion or palpation, normal fremitus, symmetrical and full respiratory excursions Cardiovascular: normal position and quality of the apical impulse, regular rhythm, normal first and paradoxically split second heart sounds, no murmurs, rubs or gallops Abdomen: no tenderness or distention, no masses by palpation, no abnormal pulsatility or arterial bruits, normal bowel sounds, no hepatosplenomegaly Extremities: no clubbing, cyanosis or edema; 2+ radial, ulnar and brachial pulses bilaterally; 2+ right femoral, posterior tibial and dorsalis pedis pulses; 2+ left femoral, posterior tibial and dorsalis pedis pulses; no subclavian or femoral bruits Neurological: grossly nonfocal, hard of hearing Psych: Normal mood and affect  EKG:  EKG is not  ordered today. The ekg ordered 04/16/2018 shows NSR, LBBB   Recent Labs: 02/09/2018: B Natriuretic Peptide 59.0 04/16/2018: ALT 46; BUN 24; Creatinine, Ser 1.27; Hemoglobin 12.0; Platelets 150; Potassium 3.9; Sodium 137    Lipid Panel Lipid Panel  No results found for: CHOL, TRIG, HDL, CHOLHDL, VLDL, LDLCALC, LDLDIRECT   Wt Readings from Last 3 Encounters:  04/21/18 154 lb (69.9 kg)  04/16/18 156 lb (70.8 kg)  03/13/18 155 lb 6.4 oz (70.5 kg)      Other studies Reviewed: Additional studies/ records that were reviewed today include: ED records Apr 16 2018.  ASSESSMENT AND PLAN:  1. PAT (paroxysmal atrial tachycardia) ([place])   2. Coronary artery disease involving native coronary artery of native heart without angina pectoris   3. Chronic diastolic heart failure ([place])   4. LBBB (left bundle branch block)   5. Atherosclerosis of aorta ([place])   6. Dyslipidemia  7. Essential hypertension      1.  SVT: This may explain her episodes of "anxiety and dyspnea.  We will increase her beta-blocker dose. 2. CAD:  mild by caoronary angio 2007, normal perfusion study 2015.  She does not have angina pectoris. 3. Diast HF: Suspect the tachycardia triggers worsening diastolic dysfunction which causes her dyspnea.  She is otherwise well compensated, NYHA functional class I.  Euvolemic by exam today, without diuretics. 4. LBBB: She has not had symptoms of high-grade AV block.  She has preserved left ventricular systolic function. 5. Aortic atherosclerosis: Described on chest x-ray from recent ER visit.   6. HLP: No recent lipid profile and she is not on a statin.  We will try to get records from PCP.  Target LDL less than 100, preferably less than 70.   7. HTN: Not well controlled.  Hopefully will improve with increased dose of beta-blocker.  She used to be on amlodipine and I am not sure why this was discontinued.  8. DM: Reportedly with good control.    Current medicines are reviewed at length  with the patient today.  The patient does not have concerns regarding medicines.  The following changes have been made:  no change  Labs/ tests ordered today include:  No orders of the defined types were placed in this encounter.    Patient Instructions  Dr Sallyanne Kuster has recommended making the following medication changes: 1. INCREASE Carvedilol to 12.5 mg twice daily  Your physician has requested that you regularly monitor your blood pressure at home. Please use the same machine to check your blood pressure daily. Keep a record of your blood pressures using the log sheet provided. In 2 weeks, please report your readings back to Dr C. You may use our online patient portal 'MyChart' or you can call the office to speak with a nurse.  Your physician recommends that you schedule an appointment in 4-6 weeks with one of our clinical pharmacists for a blood pressure chec.  Dr Sallyanne Kuster recommends that you schedule a follow-up appointment in 6 months. You will receive a reminder letter in the mail two months in advance. If you don't receive a letter, please call our office to schedule the follow-up appointment.  If you need a refill on your cardiac medications before your next appointment, please call your pharmacy.   Signed, Sanda Klein, MD  04/23/2018 3:02 PM    Sanda Klein, MD, [facility] [phone] office [phone] pager

## 2018-04-21 NOTE — Patient Instructions (Signed)
Dr Sallyanne Kuster has recommended making the following medication changes: 1. INCREASE Carvedilol to 12.5 mg twice daily  Your physician has requested that you regularly monitor your blood pressure at home. Please use the same machine to check your blood pressure daily. Keep a record of your blood pressures using the log sheet provided. In 2 weeks, please report your readings back to Dr C. You may use our online patient portal 'MyChart' or you can call the office to speak with a nurse.  Your physician recommends that you schedule an appointment in 4-6 weeks with one of our clinical pharmacists for a blood pressure chec.  Dr Sallyanne Kuster recommends that you schedule a follow-up appointment in 6 months. You will receive a reminder letter in the mail two months in advance. If you don't receive a letter, please call our office to schedule the follow-up appointment.  If you need a refill on your cardiac medications before your next appointment, please call your pharmacy.

## 2018-04-23 DIAGNOSIS — I471 Supraventricular tachycardia: Secondary | ICD-10-CM | POA: Insufficient documentation

## 2018-04-23 DIAGNOSIS — I447 Left bundle-branch block, unspecified: Secondary | ICD-10-CM | POA: Insufficient documentation

## 2018-04-23 DIAGNOSIS — E785 Hyperlipidemia, unspecified: Secondary | ICD-10-CM | POA: Insufficient documentation

## 2018-04-23 DIAGNOSIS — I700 Atherosclerosis of aorta: Secondary | ICD-10-CM | POA: Insufficient documentation

## 2018-04-25 DIAGNOSIS — E1165 Type 2 diabetes mellitus with hyperglycemia: Secondary | ICD-10-CM | POA: Diagnosis not present

## 2018-04-25 DIAGNOSIS — E782 Mixed hyperlipidemia: Secondary | ICD-10-CM | POA: Diagnosis not present

## 2018-05-15 DIAGNOSIS — H9193 Unspecified hearing loss, bilateral: Secondary | ICD-10-CM | POA: Diagnosis not present

## 2018-05-15 DIAGNOSIS — D509 Iron deficiency anemia, unspecified: Secondary | ICD-10-CM | POA: Diagnosis not present

## 2018-05-15 DIAGNOSIS — M6281 Muscle weakness (generalized): Secondary | ICD-10-CM | POA: Diagnosis not present

## 2018-05-15 DIAGNOSIS — E1122 Type 2 diabetes mellitus with diabetic chronic kidney disease: Secondary | ICD-10-CM | POA: Diagnosis not present

## 2018-05-15 DIAGNOSIS — R0789 Other chest pain: Secondary | ICD-10-CM | POA: Diagnosis not present

## 2018-05-15 DIAGNOSIS — I10 Essential (primary) hypertension: Secondary | ICD-10-CM | POA: Diagnosis not present

## 2018-05-15 DIAGNOSIS — I428 Other cardiomyopathies: Secondary | ICD-10-CM | POA: Diagnosis not present

## 2018-05-15 DIAGNOSIS — N183 Chronic kidney disease, stage 3 (moderate): Secondary | ICD-10-CM | POA: Diagnosis not present

## 2018-05-15 DIAGNOSIS — F419 Anxiety disorder, unspecified: Secondary | ICD-10-CM | POA: Diagnosis not present

## 2018-05-15 DIAGNOSIS — E782 Mixed hyperlipidemia: Secondary | ICD-10-CM | POA: Diagnosis not present

## 2018-05-24 ENCOUNTER — Ambulatory Visit (INDEPENDENT_AMBULATORY_CARE_PROVIDER_SITE_OTHER): Payer: Medicare Other | Admitting: Pharmacist Clinician (PhC)/ Clinical Pharmacy Specialist

## 2018-05-24 DIAGNOSIS — I10 Essential (primary) hypertension: Secondary | ICD-10-CM

## 2018-05-24 DIAGNOSIS — I2510 Atherosclerotic heart disease of native coronary artery without angina pectoris: Secondary | ICD-10-CM | POA: Diagnosis not present

## 2018-05-24 MED ORDER — AMLODIPINE BESYLATE 5 MG PO TABS: 5.0000 mg | ORAL_TABLET | Freq: Every day | ORAL | 3 refills | Status: DC

## 2018-05-24 NOTE — Patient Instructions (Addendum)
Return for a a follow up appointment in 1 month  Your blood pressure today is 144/72   Check your blood pressure at home daily and keep record of the readings.  Take your BP meds as follows:  Continue with all your current medications  Start amlodipine 5 mg once daily  Bring all of your meds, your BP cuff and your record of home blood pressures to your next appointment.  Exercise as you're able, try to walk approximately 30 minutes per day.  Keep salt intake to a minimum, especially watch canned and prepared boxed foods.  Eat more fresh fruits and vegetables and fewer canned items.  Avoid eating in fast food restaurants.    HOW TO TAKE YOUR BLOOD PRESSURE: . Rest 5 minutes before taking your blood pressure. .  Don't smoke or drink caffeinated beverages for at least 30 minutes before. . Take your blood pressure before (not after) you eat. . Sit comfortably with your back supported and both feet on the floor (don't cross your legs). . Elevate your arm to heart level on a table or a desk. . Use the proper sized cuff. It should fit smoothly and snugly around your bare upper arm. There should be enough room to slip a fingertip under the cuff. The bottom edge of the cuff should be 1 inch above the crease of the elbow. . Ideally, take 3 measurements at one sitting and record the average.

## 2018-05-24 NOTE — Progress Notes (Signed)
05/24/2018 Robin Guzman [birth year] [id number]   HPI:  Robin Guzman is a 84 y.o. female patient of Dr Sallyanne Kuster, with a PMH below who presents today for hypertension clinic evaluation.  In addition to hypertension, her medical history is significant for CAD, HFpEF, DM2, hyperlipidemia and paroxysmal atrial tachycardia.      She is quite hard of hearing and did not wear her hearing aids.  She reads lips to a certain extent, but otherwise will have her daughter repeat almost everything.  She has no complaints of chest pain, SOB, edema or dizziness.  She has had a few suspected panic attacks, but the daughter has found that if they just sit her down and do some deep breathing, she responds quickly.    Blood Pressure Goal:  130/80  Current Medications:  Carvedilol 12.5 mg bid  Lisinopril 2.5 mg bid  Family Hx:  Father with heart disease  Three children, all with hypertension  Social Hx:  No tobacco or alcohol; coffee each morning, occasional soda during the week  Diet:  Eats mostly at home, adds some salt with cooking, but not at table  Exercise:  Only activities of daily living, is able to do most housework by herself  Home BP readings:  Granddaughter has been checking, did not bring any readings with her today  Labs:   03/2018:  Na 137, K 3.9, Glu 215, BUN 24, SCr 1.27, CrCl 33.8     Wt Readings from Last 3 Encounters:  04/21/18 154 lb (69.9 kg)  04/16/18 156 lb (70.8 kg)  03/13/18 155 lb 6.4 oz (70.5 kg)   BP Readings from Last 3 Encounters:  05/24/18 (!) 144/72  04/21/18 (!) 158/82  04/16/18 (!) 156/76   Pulse Readings from Last 3 Encounters:  05/24/18 70  04/21/18 82  04/16/18 71    Current Outpatient Medications  Medication Sig Dispense Refill  . amLODipine (NORVASC) 5 MG tablet Take 1 tablet (5 mg total) by mouth daily. 30 tablet 3  . aspirin EC 81 MG tablet Take 81 mg by mouth daily.    . carvedilol (COREG) 12.5 MG tablet Take 1 tablet (12.5 mg  total) by mouth 2 (two) times daily with a meal. 180 tablet 3  . glipiZIDE (GLUCOTROL) 5 MG tablet Take 5 mg by mouth 2 (two) times daily before a meal.     . lisinopril (PRINIVIL,ZESTRIL) 2.5 MG tablet Take 1 tablet (2.5 mg total) by mouth 2 (two) times daily. 180 tablet 3   No current facility-administered medications for this visit.     Allergies  Allergen Reactions  . Daypro [Oxaprozin] Shortness Of Breath and Other (See Comments)    weakness  . Advil [Ibuprofen] Other (See Comments)    weakness    Past Medical History:  Diagnosis Date  . Arthritis   . CAD (coronary artery disease), native coronary artery    07/27/17 nonobstructive, EF 25-35% by LV gram  . CHF (congestive heart failure) ([place])   . Dementia   . Diabetes mellitus without complication ([place])   . Dyslipidemia   . Dyspnea   . Dyspnea on exertion   . History of stress test 07/05/2006   High risk scan cardiac cathe was recommended.  [name] Hx of echocardiogram 04/09/2011   EF 55% Mildly hypertrophic left ventricle with normal systolic function, Moderate (grade II) diastolic dysfunction. Elevated left atrial pressure. No significant valvular abnormalities. No pericardial effusion. Mild to moderate pulmonary arterial hypertension.  . Hypercholesteremia   .  Hypertension   . Myocardial infarct ([place])   . Skin cancer    HISTORY OF     Blood pressure (!) 144/72, pulse 70.  Standing 128/82  Essential hypertension Patient with still uncontrolled blood pressure, doing better with the recent increase in carvedilol.   Will add amlodipine 5 mg daily and have her continue with all other medications.   She should continue with daily home BP checks (by her granddaughter) and return in one month for follow up.   She should bring her home cuff as well as list of readings to her next appointment.  There was a 16 point drop in systolic pressure when she stood up (after 1 minute) that was asymptomatic, but nevertheless will need to keep an  eye out for any orthostatic problems.     Tommy Medal PharmD CPP [facility] [address] [place], [place] [address] [phone]

## 2018-05-24 NOTE — Progress Notes (Signed)
Thanks, [employer]

## 2018-05-24 NOTE — Assessment & Plan Note (Addendum)
Patient with still uncontrolled blood pressure, doing better with the recent increase in carvedilol.   Will add amlodipine 5 mg daily and have her continue with all other medications.   She should continue with daily home BP checks (by her granddaughter) and return in one month for follow up.   She should bring her home cuff as well as list of readings to her next appointment.  There was a 16 point drop in systolic pressure when she stood up (after 1 minute) that was asymptomatic, but nevertheless will need to keep an eye out for any orthostatic problems.

## 2018-05-27 DIAGNOSIS — E119 Type 2 diabetes mellitus without complications: Secondary | ICD-10-CM | POA: Diagnosis not present

## 2018-05-27 DIAGNOSIS — R0789 Other chest pain: Secondary | ICD-10-CM | POA: Diagnosis not present

## 2018-05-27 DIAGNOSIS — F0390 Unspecified dementia without behavioral disturbance: Secondary | ICD-10-CM | POA: Diagnosis not present

## 2018-05-27 DIAGNOSIS — E1165 Type 2 diabetes mellitus with hyperglycemia: Secondary | ICD-10-CM | POA: Diagnosis not present

## 2018-05-27 DIAGNOSIS — I499 Cardiac arrhythmia, unspecified: Secondary | ICD-10-CM | POA: Diagnosis not present

## 2018-05-27 DIAGNOSIS — R0602 Shortness of breath: Secondary | ICD-10-CM | POA: Diagnosis not present

## 2018-05-27 DIAGNOSIS — N390 Urinary tract infection, site not specified: Secondary | ICD-10-CM | POA: Diagnosis not present

## 2018-05-27 DIAGNOSIS — R079 Chest pain, unspecified: Secondary | ICD-10-CM | POA: Diagnosis not present

## 2018-05-27 DIAGNOSIS — R0902 Hypoxemia: Secondary | ICD-10-CM | POA: Diagnosis not present

## 2018-05-27 DIAGNOSIS — E785 Hyperlipidemia, unspecified: Secondary | ICD-10-CM | POA: Diagnosis not present

## 2018-05-27 DIAGNOSIS — I10 Essential (primary) hypertension: Secondary | ICD-10-CM | POA: Diagnosis not present

## 2018-05-28 DIAGNOSIS — Z7984 Long term (current) use of oral hypoglycemic drugs: Secondary | ICD-10-CM | POA: Diagnosis not present

## 2018-05-28 DIAGNOSIS — N390 Urinary tract infection, site not specified: Secondary | ICD-10-CM | POA: Diagnosis present

## 2018-05-28 DIAGNOSIS — E785 Hyperlipidemia, unspecified: Secondary | ICD-10-CM | POA: Diagnosis present

## 2018-05-28 DIAGNOSIS — R0602 Shortness of breath: Secondary | ICD-10-CM | POA: Diagnosis not present

## 2018-05-28 DIAGNOSIS — Z7982 Long term (current) use of aspirin: Secondary | ICD-10-CM | POA: Diagnosis not present

## 2018-05-28 DIAGNOSIS — R079 Chest pain, unspecified: Secondary | ICD-10-CM | POA: Diagnosis present

## 2018-05-28 DIAGNOSIS — I252 Old myocardial infarction: Secondary | ICD-10-CM | POA: Diagnosis not present

## 2018-05-28 DIAGNOSIS — Z79899 Other long term (current) drug therapy: Secondary | ICD-10-CM | POA: Diagnosis not present

## 2018-05-28 DIAGNOSIS — E119 Type 2 diabetes mellitus without complications: Secondary | ICD-10-CM | POA: Diagnosis present

## 2018-05-28 DIAGNOSIS — I10 Essential (primary) hypertension: Secondary | ICD-10-CM | POA: Diagnosis present

## 2018-05-28 DIAGNOSIS — F0390 Unspecified dementia without behavioral disturbance: Secondary | ICD-10-CM | POA: Diagnosis present

## 2018-05-29 IMAGING — DX DG CHEST 2V
2 series · 2 of 2 positions shown · non-contrast
Comparison: Radiographs October 29, 2017.

CLINICAL DATA: Chest pain.

EXAM:
CHEST  2 VIEW

[chest lat]
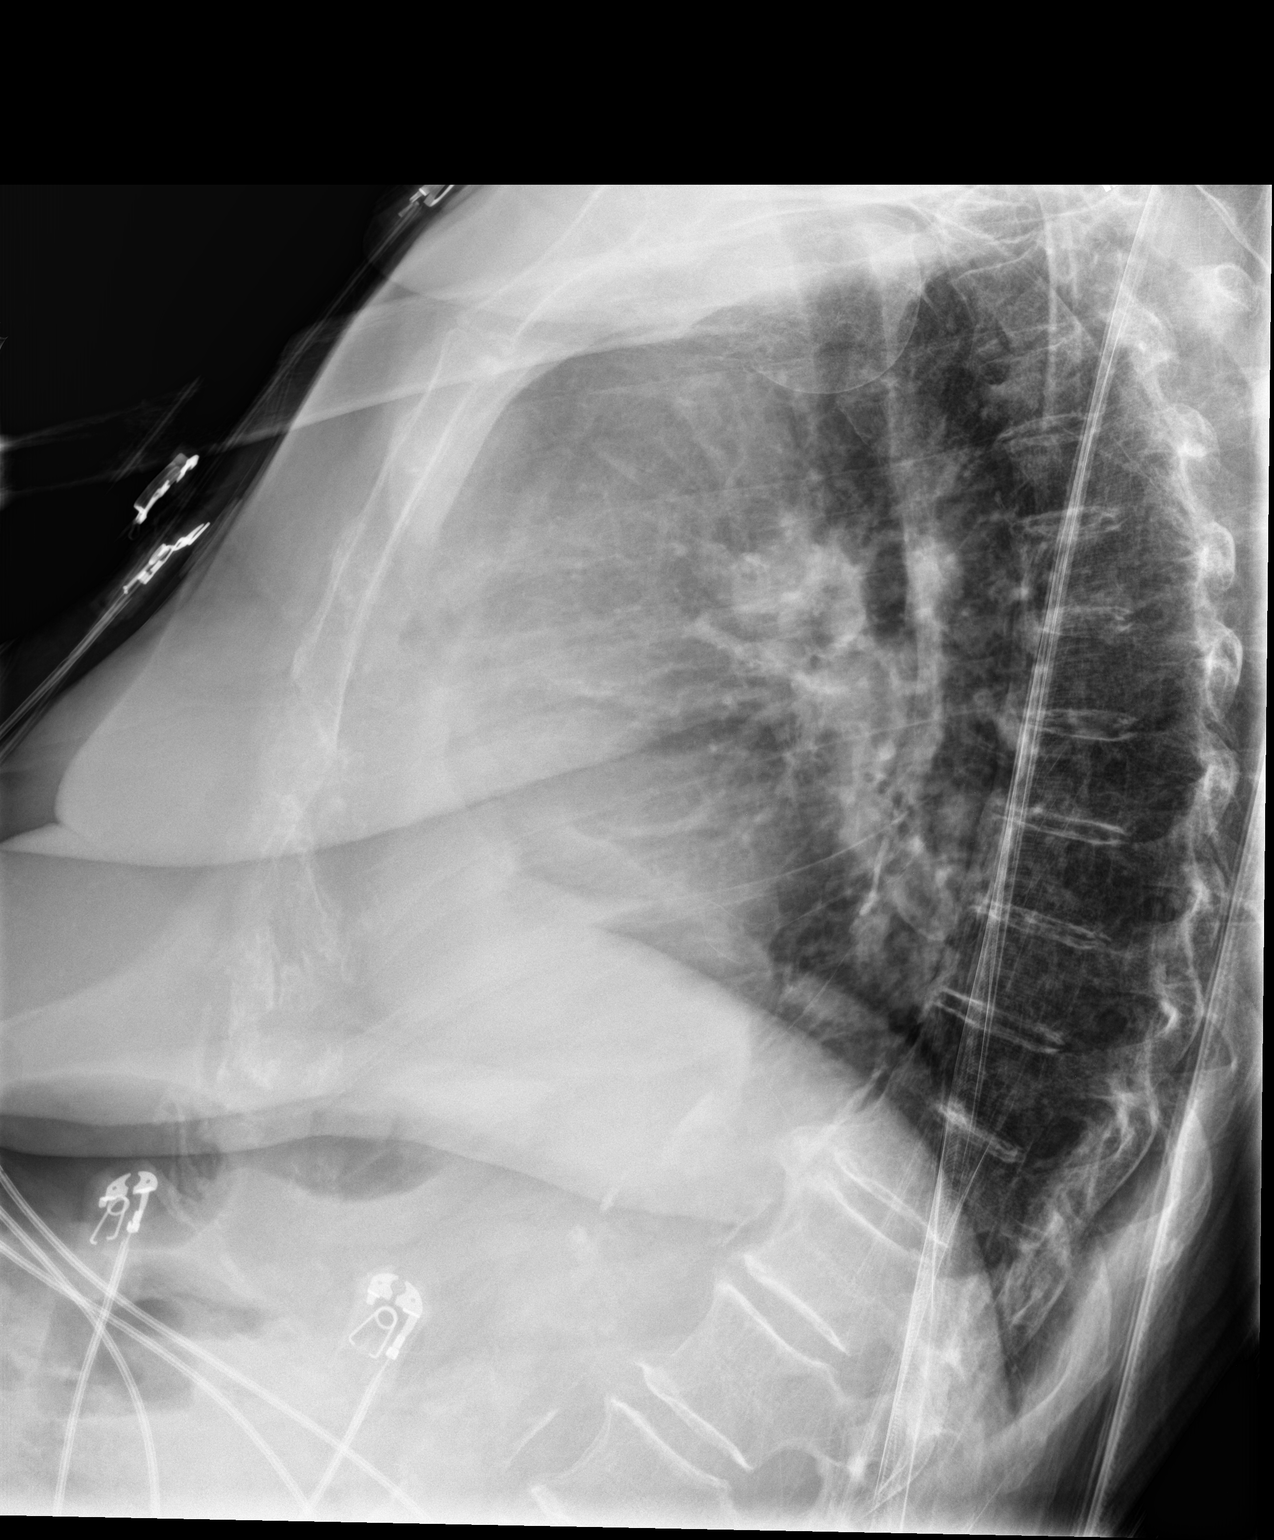

[chest ap]
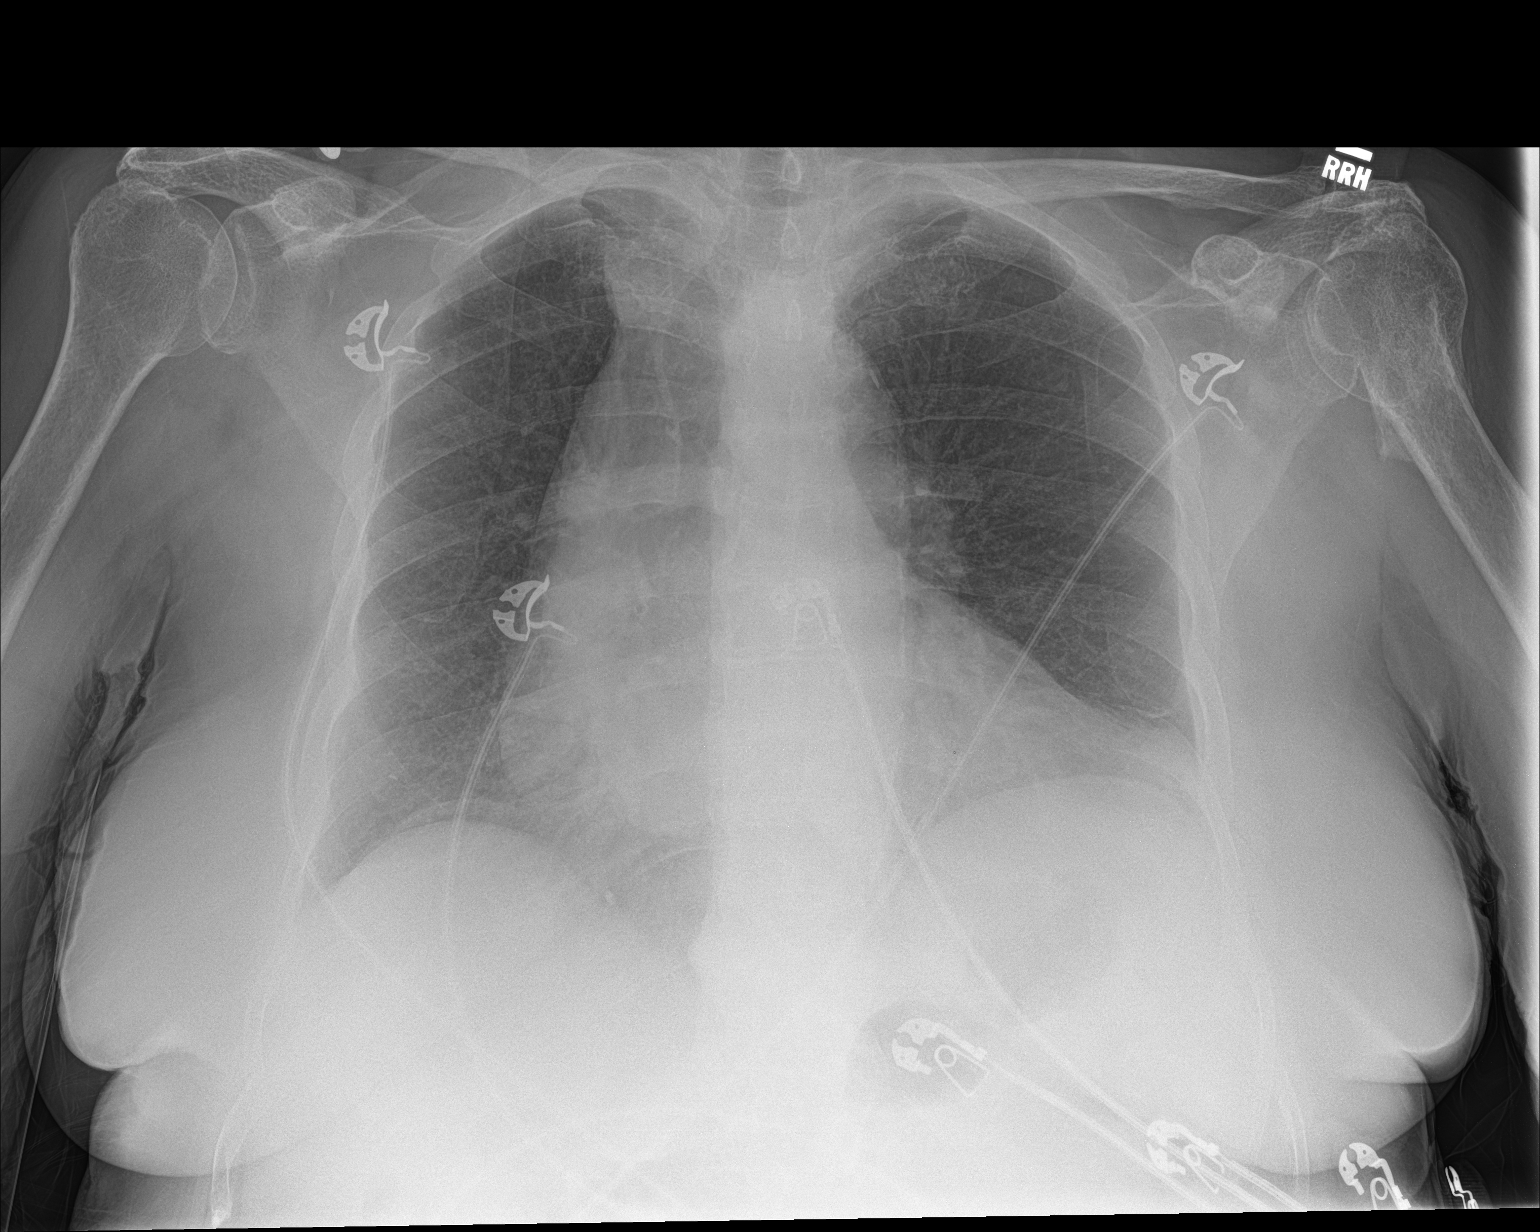

[2 of 2 positions shown; findings below may reference images not displayed]

FINDINGS: Stable cardiomediastinal silhouette. Atherosclerosis of thoracic
aorta is noted. No pneumothorax or pleural effusion is noted. Both
lungs are clear. The visualized skeletal structures are
unremarkable.
IMPRESSION: No active cardiopulmonary disease.  Aortic atherosclerosis.

## 2018-06-02 DIAGNOSIS — R0602 Shortness of breath: Secondary | ICD-10-CM | POA: Diagnosis not present

## 2018-06-02 DIAGNOSIS — F419 Anxiety disorder, unspecified: Secondary | ICD-10-CM | POA: Diagnosis not present

## 2018-06-02 DIAGNOSIS — Z6830 Body mass index (BMI) 30.0-30.9, adult: Secondary | ICD-10-CM | POA: Diagnosis not present

## 2018-06-02 DIAGNOSIS — N390 Urinary tract infection, site not specified: Secondary | ICD-10-CM | POA: Diagnosis not present

## 2018-06-02 DIAGNOSIS — I10 Essential (primary) hypertension: Secondary | ICD-10-CM | POA: Diagnosis not present

## 2018-06-19 NOTE — Progress Notes (Signed)
06/21/2018 Robin Guzman 1928/03/31 [id number]   HPI:  Robin Guzman is a 84 y.o. female patient of Dr Sallyanne Kuster, with a PMH below who presents today for hypertension clinic evaluation.  In addition to hypertension, her medical history is significant for CAD, HFpEF, DM2, hyperlipidemia and paroxysmal atrial tachycardia.      She is quite hard of hearing, but does not wear her hearing aids.  She reads lips to a certain extent, but otherwise will have her daughter repeat almost everything.  She has no complaints of chest pain, SOB, edema or dizziness.  At her last visit we added amlodipine 5 mg daily.  She has not had any problems or concerns with this.  Patient was hospitalized overnight in early July for a UTI, but has otherwise been doing well.   At her last visit we noted a 16 point drop in blood pressure from sitting to standing 1 minute.    Blood Pressure Goal:  130/80  Current Medications:  Carvedilol 12.5 mg bid  Lisinopril 2.5 mg bid  Amlodipine 5 mg qd  Family Hx:  Father with heart disease  Three children, all with hypertension  Social Hx:  No tobacco or alcohol; coffee each morning, occasional soda during the week  Diet:  Eats mostly at home, adds some salt with cooking, but not at table  Exercise:  Only activities of daily living, is able to do most housework by herself  Home BP readings:  Checked twice daily most days in past month.  Average AM (20 readings) 130/71, PM 138/75.  Overall range was 101-165/56-80  Labs:   03/2018:  Na 137, K 3.9, Glu 215, BUN 24, SCr 1.27, CrCl 33.8     Wt Readings from Last 3 Encounters:  04/21/18 154 lb (69.9 kg)  04/16/18 156 lb (70.8 kg)  03/13/18 155 lb 6.4 oz (70.5 kg)   BP Readings from Last 3 Encounters:  06/20/18 132/70  05/24/18 (!) 144/72  04/21/18 (!) 158/82   Pulse Readings from Last 3 Encounters:  06/20/18 68  05/24/18 70  04/21/18 82    Current Outpatient Medications  Medication Sig Dispense  Refill  . atorvastatin (LIPITOR) 10 MG tablet Take 10 mg by mouth daily.    [name] amLODipine (NORVASC) 5 MG tablet Take 1 tablet (5 mg total) by mouth daily. 30 tablet 3  . aspirin EC 81 MG tablet Take 81 mg by mouth daily.    . carvedilol (COREG) 12.5 MG tablet Take 1 tablet (12.5 mg total) by mouth 2 (two) times daily with a meal. 180 tablet 3  . glipiZIDE (GLUCOTROL) 5 MG tablet Take 5 mg by mouth 2 (two) times daily before a meal.     . lisinopril (PRINIVIL,ZESTRIL) 2.5 MG tablet Take 1 tablet (2.5 mg total) by mouth 2 (two) times daily. 180 tablet 3   No current facility-administered medications for this visit.     Allergies  Allergen Reactions  . Daypro [Oxaprozin] Shortness Of Breath and Other (See Comments)    weakness  . Advil [Ibuprofen] Other (See Comments)    weakness    Past Medical History:  Diagnosis Date  . Arthritis   . CAD (coronary artery disease), native coronary artery    07/27/17 nonobstructive, EF 25-35% by LV gram  . CHF (congestive heart failure) ([place])   . Dementia   . Diabetes mellitus without complication ([place])   . Dyslipidemia   . Dyspnea   . Dyspnea on exertion   .  History of stress test 07/05/2006   High risk scan cardiac cathe was recommended.  [name] Hx of echocardiogram 04/09/2011   EF 55% Mildly hypertrophic left ventricle with normal systolic function, Moderate (grade II) diastolic dysfunction. Elevated left atrial pressure. No significant valvular abnormalities. No pericardial effusion. Mild to moderate pulmonary arterial hypertension.  . Hypercholesteremia   . Hypertension   . Myocardial infarct ([place])   . Skin cancer    HISTORY OF     Blood pressure 132/70, pulse 68.    Essential hypertension Patient with essential hypertension, now looking much better with addition of amlodipine.   While she still has some elevated readings (5/37 were > 734 systolic), she is doing much better.  Because of some drop with positional changes, I am not going to make  any changes at this time.  We will see her back in 2 months to touch base and make sure she is still doing well.    Tommy Medal PharmD CPP [facility] [address] [place], [place] [address] [phone]

## 2018-06-20 ENCOUNTER — Ambulatory Visit (INDEPENDENT_AMBULATORY_CARE_PROVIDER_SITE_OTHER): Payer: Medicare Other | Admitting: Pharmacist Clinician (PhC)/ Clinical Pharmacy Specialist

## 2018-06-20 DIAGNOSIS — I10 Essential (primary) hypertension: Secondary | ICD-10-CM

## 2018-06-20 DIAGNOSIS — I2510 Atherosclerotic heart disease of native coronary artery without angina pectoris: Secondary | ICD-10-CM

## 2018-06-20 NOTE — Patient Instructions (Signed)
Return for a a follow up appointment in 2 months  Your blood pressure today is 132/70  Check your blood pressure at home daily and keep record of the readings.  Take your BP meds as follows:  Continue with all current medications  Bring all of your meds, your BP cuff and your record of home blood pressures to your next appointment.  Exercise as you're able, try to walk approximately 30 minutes per day.  Keep salt intake to a minimum, especially watch canned and prepared boxed foods.  Eat more fresh fruits and vegetables and fewer canned items.  Avoid eating in fast food restaurants.    HOW TO TAKE YOUR BLOOD PRESSURE: . Rest 5 minutes before taking your blood pressure. .  Don't smoke or drink caffeinated beverages for at least 30 minutes before. . Take your blood pressure before (not after) you eat. . Sit comfortably with your back supported and both feet on the floor (don't cross your legs). . Elevate your arm to heart level on a table or a desk. . Use the proper sized cuff. It should fit smoothly and snugly around your bare upper arm. There should be enough room to slip a fingertip under the cuff. The bottom edge of the cuff should be 1 inch above the crease of the elbow. . Ideally, take 3 measurements at one sitting and record the average.

## 2018-06-21 ENCOUNTER — Encounter: Payer: Self-pay | Admitting: Pharmacist Clinician (PhC)/ Clinical Pharmacy Specialist

## 2018-06-21 NOTE — Assessment & Plan Note (Signed)
Patient with essential hypertension, now looking much better with addition of amlodipine.   While she still has some elevated readings (5/37 were > 014 systolic), she is doing much better.  Because of some drop with positional changes, I am not going to make any changes at this time.  We will see her back in 2 months to touch base and make sure she is still doing well.

## 2018-07-14 DIAGNOSIS — E782 Mixed hyperlipidemia: Secondary | ICD-10-CM | POA: Diagnosis not present

## 2018-07-14 DIAGNOSIS — N183 Chronic kidney disease, stage 3 (moderate): Secondary | ICD-10-CM | POA: Diagnosis not present

## 2018-07-14 DIAGNOSIS — E875 Hyperkalemia: Secondary | ICD-10-CM | POA: Diagnosis not present

## 2018-07-14 DIAGNOSIS — E1165 Type 2 diabetes mellitus with hyperglycemia: Secondary | ICD-10-CM | POA: Diagnosis not present

## 2018-07-14 DIAGNOSIS — R0602 Shortness of breath: Secondary | ICD-10-CM | POA: Diagnosis not present

## 2018-07-14 DIAGNOSIS — R399 Unspecified symptoms and signs involving the genitourinary system: Secondary | ICD-10-CM | POA: Diagnosis not present

## 2018-07-14 DIAGNOSIS — D649 Anemia, unspecified: Secondary | ICD-10-CM | POA: Diagnosis not present

## 2018-07-14 DIAGNOSIS — R531 Weakness: Secondary | ICD-10-CM | POA: Diagnosis not present

## 2018-07-14 DIAGNOSIS — D509 Iron deficiency anemia, unspecified: Secondary | ICD-10-CM | POA: Diagnosis not present

## 2018-07-14 DIAGNOSIS — N390 Urinary tract infection, site not specified: Secondary | ICD-10-CM | POA: Diagnosis not present

## 2018-07-14 DIAGNOSIS — I214 Non-ST elevation (NSTEMI) myocardial infarction: Secondary | ICD-10-CM | POA: Diagnosis not present

## 2018-07-14 DIAGNOSIS — I10 Essential (primary) hypertension: Secondary | ICD-10-CM | POA: Diagnosis not present

## 2018-07-14 DIAGNOSIS — I5181 Takotsubo syndrome: Secondary | ICD-10-CM | POA: Diagnosis not present

## 2018-07-14 DIAGNOSIS — I428 Other cardiomyopathies: Secondary | ICD-10-CM | POA: Diagnosis not present

## 2018-08-07 DIAGNOSIS — Z23 Encounter for immunization: Secondary | ICD-10-CM | POA: Diagnosis not present

## 2018-08-11 DIAGNOSIS — D509 Iron deficiency anemia, unspecified: Secondary | ICD-10-CM | POA: Diagnosis not present

## 2018-08-11 DIAGNOSIS — E1122 Type 2 diabetes mellitus with diabetic chronic kidney disease: Secondary | ICD-10-CM | POA: Diagnosis not present

## 2018-08-11 DIAGNOSIS — I10 Essential (primary) hypertension: Secondary | ICD-10-CM | POA: Diagnosis not present

## 2018-08-11 DIAGNOSIS — D649 Anemia, unspecified: Secondary | ICD-10-CM | POA: Diagnosis not present

## 2018-08-11 DIAGNOSIS — E1165 Type 2 diabetes mellitus with hyperglycemia: Secondary | ICD-10-CM | POA: Diagnosis not present

## 2018-08-15 ENCOUNTER — Ambulatory Visit (INDEPENDENT_AMBULATORY_CARE_PROVIDER_SITE_OTHER): Payer: Medicare Other | Admitting: Pharmacist Clinician (PhC)/ Clinical Pharmacy Specialist

## 2018-08-15 DIAGNOSIS — I10 Essential (primary) hypertension: Secondary | ICD-10-CM | POA: Diagnosis not present

## 2018-08-15 DIAGNOSIS — I2510 Atherosclerotic heart disease of native coronary artery without angina pectoris: Secondary | ICD-10-CM | POA: Diagnosis not present

## 2018-08-15 NOTE — Assessment & Plan Note (Signed)
Patient with essential hypertension.  While her home readings are ranging from 595-638 systolic, we do not know the accuracy of her home wrist cuff.  I have asked her to take all of her readings as well as her cuff, to her PCP in [place] on Friday.  Because she sees him more frequently we will defer to him to monitor her BP and adjust medications.  Her family is happy with this, as they have more difficulty getting her down to [place].   They know they can call at any time with concerns or questions.

## 2018-08-15 NOTE — Progress Notes (Signed)
08/15/2018 Aram Beecham [place] Dec 28, 1927 [id number]   HPI:  Robin Guzman is a 84 y.o. female patient of Dr Sallyanne Kuster, with a PMH below who presents today for hypertension clinic follow up.  In addition to hypertension, her medical history is significant for CAD, HFpEF, DM2, hyperlipidemia and paroxysmal atrial tachycardia.      She is quite hard of hearing, but does not wear her hearing aids.  She reads lips to a certain extent, but otherwise will have her daughter repeat almost everything.  She has no complaints of chest pain, SOB, edema or dizziness.  At her last visit we added amlodipine 5 mg daily.  She has not had any problems or concerns with this.  Patient was hospitalized overnight in early July for a UTI, but has otherwise been doing well. At her visit shortly after that she was noted to have a 16 point drop in BP from sitting to standing x 1 minute.    At her last visit her pressure was stable at 132/70.  She was asked to continue with home monitoring and her current medications and return in 2 months to be sure she was doing well.  Since then she saw her PCP and he discontinued her amlodipine because of leg weakness.  She notes feeling better since stopping this, however I notice that her evening blood pressures are running more in the 503T' systolic.    Blood Pressure Goal:  130/80  Current Medications:  Carvedilol 12.5 mg bid  Lisinopril 2.5 mg bid  Family Hx:  Father with heart disease  Three children, all with hypertension  Social Hx:  No tobacco or alcohol; coffee each morning, occasional soda during the week  Diet:  Eats mostly at home, adds some salt with cooking, but not at table  Exercise:  Only activities of daily living, is able to do most housework by herself  Home BP readings:  Checked twice daily most days in past month.  Range was 465-681 systolic.  Most were 130-150 range.  Gave the information back to patient, as she will see PCP Friday and we will  have him follow.   Labs:   03/2018:  Na 137, K 3.9, Glu 215, BUN 24, SCr 1.27, CrCl 33.8     Wt Readings from Last 3 Encounters:  04/21/18 154 lb (69.9 kg)  04/16/18 156 lb (70.8 kg)  03/13/18 155 lb 6.4 oz (70.5 kg)   BP Readings from Last 3 Encounters:  08/15/18 (!) 158/80  06/20/18 132/70  05/24/18 (!) 144/72   Pulse Readings from Last 3 Encounters:  08/15/18 70  06/20/18 68  05/24/18 70    Current Outpatient Medications  Medication Sig Dispense Refill  . aspirin EC 81 MG tablet Take 81 mg by mouth daily.    [name] atorvastatin (LIPITOR) 10 MG tablet Take 10 mg by mouth daily.    . carvedilol (COREG) 12.5 MG tablet Take 1 tablet (12.5 mg total) by mouth 2 (two) times daily with a meal. 180 tablet 3  . glipiZIDE (GLUCOTROL) 5 MG tablet Take 5 mg by mouth 2 (two) times daily before a meal.     . lisinopril (PRINIVIL,ZESTRIL) 2.5 MG tablet Take 1 tablet (2.5 mg total) by mouth 2 (two) times daily. 180 tablet 3   No current facility-administered medications for this visit.     Allergies  Allergen Reactions  . Daypro [Oxaprozin] Shortness Of Breath and Other (See Comments)    weakness  . Advil [Ibuprofen]  Other (See Comments)    weakness    Past Medical History:  Diagnosis Date  . Arthritis   . CAD (coronary artery disease), native coronary artery    07/27/17 nonobstructive, EF 25-35% by LV gram  . CHF (congestive heart failure) ([place])   . Dementia   . Diabetes mellitus without complication ([place])   . Dyslipidemia   . Dyspnea   . Dyspnea on exertion   . History of stress test 07/05/2006   High risk scan cardiac cathe was recommended.  [name] Hx of echocardiogram 04/09/2011   EF 55% Mildly hypertrophic left ventricle with normal systolic function, Moderate (grade II) diastolic dysfunction. Elevated left atrial pressure. No significant valvular abnormalities. No pericardial effusion. Mild to moderate pulmonary arterial hypertension.  . Hypercholesteremia   . Hypertension   .  Myocardial infarct ([place])   . Skin cancer    HISTORY OF     Blood pressure (!) 158/80, pulse 70.    Essential hypertension Patient with essential hypertension.  While her home readings are ranging from 119-417 systolic, we do not know the accuracy of her home wrist cuff.  I have asked her to take all of her readings as well as her cuff, to her PCP in [place] on Friday.  Because she sees him more frequently we will defer to him to monitor her BP and adjust medications.  Her family is happy with this, as they have more difficulty getting her down to [place].   They know they can call at any time with concerns or questions.     Tommy Medal PharmD CPP [facility] [address] [place], [place] [address] [phone]

## 2018-08-15 NOTE — Patient Instructions (Addendum)
Follow up with Dr. Nevada Crane in [place] for your blood pressure.   Your blood pressure today is 158/80  Check your blood pressure at home daily and keep record of the readings.  Please take your BP cuff to Dr. Nevada Crane on Friday to check it for accuracy.    We will defer to Dr. Nevada Crane to monitor your blood pressure as it is easier for you to get to his office.  Take your BP meds as follows:  Continue all current medication  Bring all of your meds, your BP cuff and your record of home blood pressures to your next appointment.  Exercise as you're able, try to walk approximately 30 minutes per day.  Keep salt intake to a minimum, especially watch canned and prepared boxed foods.  Eat more fresh fruits and vegetables and fewer canned items.  Avoid eating in fast food restaurants.    HOW TO TAKE YOUR BLOOD PRESSURE: . Rest 5 minutes before taking your blood pressure. .  Don't smoke or drink caffeinated beverages for at least 30 minutes before. . Take your blood pressure before (not after) you eat. . Sit comfortably with your back supported and both feet on the floor (don't cross your legs). . Elevate your arm to heart level on a table or a desk. . Use the proper sized cuff. It should fit smoothly and snugly around your bare upper arm. There should be enough room to slip a fingertip under the cuff. The bottom edge of the cuff should be 1 inch above the crease of the elbow. . Ideally, take 3 measurements at one sitting and record the average.

## 2018-08-18 DIAGNOSIS — D509 Iron deficiency anemia, unspecified: Secondary | ICD-10-CM | POA: Diagnosis not present

## 2018-08-18 DIAGNOSIS — Z0000 Encounter for general adult medical examination without abnormal findings: Secondary | ICD-10-CM | POA: Diagnosis not present

## 2018-08-18 DIAGNOSIS — E1165 Type 2 diabetes mellitus with hyperglycemia: Secondary | ICD-10-CM | POA: Diagnosis not present

## 2018-08-18 DIAGNOSIS — F419 Anxiety disorder, unspecified: Secondary | ICD-10-CM | POA: Diagnosis not present

## 2018-08-18 DIAGNOSIS — E782 Mixed hyperlipidemia: Secondary | ICD-10-CM | POA: Diagnosis not present

## 2018-08-18 DIAGNOSIS — M6281 Muscle weakness (generalized): Secondary | ICD-10-CM | POA: Diagnosis not present

## 2018-08-18 DIAGNOSIS — Z6829 Body mass index (BMI) 29.0-29.9, adult: Secondary | ICD-10-CM | POA: Diagnosis not present

## 2018-08-18 DIAGNOSIS — I214 Non-ST elevation (NSTEMI) myocardial infarction: Secondary | ICD-10-CM | POA: Diagnosis not present

## 2018-08-18 DIAGNOSIS — I5020 Unspecified systolic (congestive) heart failure: Secondary | ICD-10-CM | POA: Diagnosis not present

## 2018-08-18 DIAGNOSIS — I10 Essential (primary) hypertension: Secondary | ICD-10-CM | POA: Diagnosis not present

## 2018-08-18 DIAGNOSIS — N183 Chronic kidney disease, stage 3 (moderate): Secondary | ICD-10-CM | POA: Diagnosis not present

## 2018-08-18 DIAGNOSIS — I428 Other cardiomyopathies: Secondary | ICD-10-CM | POA: Diagnosis not present

## 2018-10-23 ENCOUNTER — Encounter: Payer: Self-pay | Admitting: Cardiovascular Disease

## 2018-10-23 ENCOUNTER — Ambulatory Visit (INDEPENDENT_AMBULATORY_CARE_PROVIDER_SITE_OTHER): Payer: Medicare Other | Admitting: Cardiovascular Disease

## 2018-10-23 ENCOUNTER — Encounter (INDEPENDENT_AMBULATORY_CARE_PROVIDER_SITE_OTHER): Payer: Self-pay

## 2018-10-23 VITALS — BP 136/78 | HR 71 | Ht 60.0 in | Wt 152.2 lb

## 2018-10-23 DIAGNOSIS — I5032 Chronic diastolic (congestive) heart failure: Secondary | ICD-10-CM

## 2018-10-23 DIAGNOSIS — E7800 Pure hypercholesterolemia, unspecified: Secondary | ICD-10-CM | POA: Diagnosis not present

## 2018-10-23 DIAGNOSIS — I447 Left bundle-branch block, unspecified: Secondary | ICD-10-CM | POA: Diagnosis not present

## 2018-10-23 DIAGNOSIS — I2510 Atherosclerotic heart disease of native coronary artery without angina pectoris: Secondary | ICD-10-CM | POA: Diagnosis not present

## 2018-10-23 DIAGNOSIS — E119 Type 2 diabetes mellitus without complications: Secondary | ICD-10-CM | POA: Diagnosis not present

## 2018-10-23 DIAGNOSIS — I700 Atherosclerosis of aorta: Secondary | ICD-10-CM

## 2018-10-23 DIAGNOSIS — I471 Supraventricular tachycardia: Secondary | ICD-10-CM

## 2018-10-23 DIAGNOSIS — I10 Essential (primary) hypertension: Secondary | ICD-10-CM

## 2018-10-23 MED ORDER — CARVEDILOL 25 MG PO TABS: 25.0000 mg | ORAL_TABLET | Freq: Two times a day (BID) | ORAL | 3 refills | Status: AC

## 2018-10-23 NOTE — Patient Instructions (Signed)
Medication Instructions:  Dr Sallyanne Kuster has recommended making the following medication changes: 1. INCREASE Carvedilol to 25 mg twice daily 2. STOP Lisinopril  If you need a refill on your cardiac medications before your next appointment, please call your pharmacy.   Follow-Up: At [facility], you and your health needs are our priority.  As part of our continuing mission to provide you with exceptional heart care, we have created designated Provider Care Teams.  These Care Teams include your primary Cardiologist (physician) and Advanced Practice Providers (APPs -  Physician Assistants and Nurse Practitioners) who all work together to provide you with the care you need, when you need it. You will need a follow up appointment in 6 months.  Please call our office 2 months in advance to schedule this appointment.  You may see Sanda Klein, MD or one of the following Advanced Practice Providers on your designated Care Team: [place], [place] . Fabian Sharp, PA-C

## 2018-10-23 NOTE — Progress Notes (Signed)
Patient ID: Robin Guzman, female   DOB: 04/24/1928, 84 y.o.   MRN: [id number]     Cardiology Office Note   Date:  10/23/2018   ID:  Robin Guzman, DOB 26-Jul-1928, MRN [id number]  PCP:  Celene Squibb, MD  Cardiologist:   Sanda Klein, MD   Chief Complaint  Patient presents with  . Shortness of Breath  . Irregular Heart Beat    SVT      History of Present Illness: Robin Guzman is a 84 y.o. female who presents for follow up for CAD and HFPEF with background HTN, DM and hyperlipidemia and LBBB. She had a normal nuclear study in February 2015. Echo in March 2017 was showed LVH, normal LVEF and diastolic dysfunction. Her event monitor showed PACs and a brief 10-beat run of atrial tachycardia.  In May 2019 she was in the emergency room when she had an episode of paroxysmal atrial tachycardia  She continues to have occasional episodes of abrupt onset of dyspnea and anxiety, probably episodes of PAT.  These often happen at night.  They resolve spontaneously and never lasting more than a few minutes.  The overall frequency seems to have diminished after we increase the dose of carvedilol.  She has not needed additional emergency room visits.  Otherwise she does well, continues to live alone in her own home, with her daughter living essentially next-door.  Takes care of her own housework for the most part.  Only develops dyspnea "if she rushes".  Denies angina, syncope, leg edema, palpitations, focal neurological complaints, claudication.   Past Medical History:  Diagnosis Date  . Arthritis   . CAD (coronary artery disease), native coronary artery    07/27/17 nonobstructive, EF 25-35% by LV gram  . CHF (congestive heart failure) ([place])   . Dementia ([place])   . Diabetes mellitus without complication ([place])   . Dyslipidemia   . Dyspnea   . Dyspnea on exertion   . History of stress test 07/05/2006   High risk scan cardiac cathe was recommended.  [name] Hx of echocardiogram 04/09/2011   EF 55%  Mildly hypertrophic left ventricle with normal systolic function, Moderate (grade II) diastolic dysfunction. Elevated left atrial pressure. No significant valvular abnormalities. No pericardial effusion. Mild to moderate pulmonary arterial hypertension.  . Hypercholesteremia   . Hypertension   . Myocardial infarct ([place])   . Skin cancer    HISTORY OF     Past Surgical History:  Procedure Laterality Date  . ABDOMINAL HYSTERECTOMY    . CATARACT EXTRACTION W/PHACO Right 05/20/2017   Procedure: CATARACT EXTRACTION PHACO AND INTRAOCULAR LENS PLACEMENT RIGHT EYE;  Surgeon: Tonny Branch, MD;  Location: [facility];  Service: Ophthalmology;  Laterality: Right;  CDE: 17.77  . CATARACT EXTRACTION W/PHACO Left 06/21/2017   Procedure: CATARACT EXTRACTION PHACO AND INTRAOCULAR LENS PLACEMENT (IOC);  Surgeon: Tonny Branch, MD;  Location: [facility];  Service: Ophthalmology;  Laterality: Left;  CDE: 8.90  . LEFT HEART CATH AND CORONARY ANGIOGRAPHY N/A 07/27/2017   Procedure: LEFT HEART CATH AND CORONARY ANGIOGRAPHY;  Surgeon: Wellington Hampshire, MD;  Location: [facility];  Service: Cardiovascular;  Laterality: N/A;     Current Outpatient Medications  Medication Sig Dispense Refill  . aspirin EC 81 MG tablet Take 81 mg by mouth daily.    [name] atorvastatin (LIPITOR) 10 MG tablet Take 10 mg by mouth daily.    . carvedilol (COREG) 25 MG tablet Take 1 tablet (25 mg total) by mouth  2 (two) times daily with a meal. 180 tablet 3  . glipiZIDE (GLUCOTROL) 5 MG tablet Take 5 mg by mouth 2 (two) times daily before a meal.      No current facility-administered medications for this visit.     Allergies:   Daypro [oxaprozin] and Advil [ibuprofen]    Social History:  The patient  reports that she has never smoked. She has never used smokeless tobacco. She reports that she does not drink alcohol or use drugs.   Family History:  The patient's family history includes Cancer in her brother, mother, and sister; Heart Problems in  her father.    ROS:  Please see the history of present illness.    Otherwise, review of systems positive for none.   All other systems are reviewed and are negative  PHYSICAL EXAM: VS:  BP 136/78   Pulse 71   Ht 5' (1.524 m)   Wt 152 lb 3.2 oz (69 kg)   BMI 29.72 kg/m  , BMI Body mass index is 29.72 kg/m.    General: Alert, oriented x3, no distress, extremely hard of hearing, overweight Head: no evidence of trauma, PERRL, EOMI, no exophtalmos or lid lag, no myxedema, no xanthelasma; normal ears, nose and oropharynx Neck: normal jugular venous pulsations and no hepatojugular reflux; brisk carotid pulses without delay and no carotid bruits Chest: clear to auscultation, no signs of consolidation by percussion or palpation, normal fremitus, symmetrical and full respiratory excursions Cardiovascular: normal position and quality of the apical impulse, regular rhythm, normal first and second heart sounds, no murmurs, rubs or gallops Abdomen: no tenderness or distention, no masses by palpation, no abnormal pulsatility or arterial bruits, normal bowel sounds, no hepatosplenomegaly Extremities: no clubbing, cyanosis or edema; 2+ radial, ulnar and brachial pulses bilaterally; 2+ right femoral, posterior tibial and dorsalis pedis pulses; 2+ left femoral, posterior tibial and dorsalis pedis pulses; no subclavian or femoral bruits Neurological: grossly nonfocal except for hearing Psych: Normal mood and affect   EKG:  EKG is ordered today, shows normal sinus rhythm, bundle branch block  Recent Labs: 02/09/2018: B Natriuretic Peptide 59.0 04/16/2018: ALT 46; BUN 24; Creatinine, Ser 1.27; Hemoglobin 12.0; Platelets 150; Potassium 3.9; Sodium 137    Lipid Panel No results found for: CHOL, TRIG, HDL, CHOLHDL, VLDL, LDLCALC, LDLDIRECT   Wt Readings from Last 3 Encounters:  10/23/18 152 lb 3.2 oz (69 kg)  04/21/18 154 lb (69.9 kg)  04/16/18 156 lb (70.8 kg)     Other studies  Reviewed:   ASSESSMENT AND PLAN:  1. SVT (supraventricular tachycardia) ([place])   2. Essential hypertension   3. Coronary artery disease involving native coronary artery of native heart without angina pectoris   4. Chronic diastolic heart failure ([place])   5. LBBB (left bundle branch block)   6. Atherosclerosis of aorta ([place])   7. Hypercholesteremia   8. Controlled type 2 diabetes mellitus without complication, without long-term current use of insulin ([place])      1. SVT: This may explain her episodes of "anxiety and dyspnea".  Seemed to improve after higher dose of beta-blocker and will try to increase this further to maximum dose of 25 mg twice daily. 2. HTN: Decrease lisinopril to avoid hypotension on the higher dose of beta-blocker. 3. CAD: Asymptomatic.  Mild by coronary angio 2007, normal perfusion study 2015.   4. Diast HF: Suspect the tachycardia triggers worsening diastolic dysfunction which causes her dyspnea.  Otherwise euvolemic with NYHA functional class I-2 without need  for loop diuretics. 5. LBBB: No syncope/near syncope.  She has not had symptoms of high-grade AV block.  She has preserved left ventricular systolic function. 6. Aortic atherosclerosis: Described on chest x-ray.  No history of stroke.  On statin. 7. HLP: Target LDL less than 100, preferably less than 70.   8. DM: Reportedly with good control.    Current medicines are reviewed at length with the patient today.  The patient does not have concerns regarding medicines.  The following changes have been made:  no change  Labs/ tests ordered today include:   Orders Placed This Encounter  Procedures  . EKG 12-Lead     Patient Instructions  Medication Instructions:  Dr Sallyanne Kuster has recommended making the following medication changes: 1. INCREASE Carvedilol to 25 mg twice daily 2. STOP Lisinopril  If you need a refill on your cardiac medications before your next appointment, please call your pharmacy.    Follow-Up: At [facility], you and your health needs are our priority.  As part of our continuing mission to provide you with exceptional heart care, we have created designated Provider Care Teams.  These Care Teams include your primary Cardiologist (physician) and Advanced Practice Providers (APPs -  Physician Assistants and Nurse Practitioners) who all work together to provide you with the care you need, when you need it. You will need a follow up appointment in 6 months.  Please call our office 2 months in advance to schedule this appointment.  You may see Sanda Klein, MD or one of the following Advanced Practice Providers on your designated Care Team: [place], [place] . Fabian Sharp, PA-C   Signed, Sanda Klein, MD  10/23/2018 10:19 AM    Sanda Klein, MD, [facility] [phone] office [phone] pager

## 2018-11-16 DIAGNOSIS — I10 Essential (primary) hypertension: Secondary | ICD-10-CM | POA: Diagnosis not present

## 2018-11-16 DIAGNOSIS — N183 Chronic kidney disease, stage 3 (moderate): Secondary | ICD-10-CM | POA: Diagnosis not present

## 2018-11-16 DIAGNOSIS — E782 Mixed hyperlipidemia: Secondary | ICD-10-CM | POA: Diagnosis not present

## 2018-11-16 DIAGNOSIS — D509 Iron deficiency anemia, unspecified: Secondary | ICD-10-CM | POA: Diagnosis not present

## 2018-11-16 DIAGNOSIS — R531 Weakness: Secondary | ICD-10-CM | POA: Diagnosis not present

## 2018-11-16 DIAGNOSIS — D649 Anemia, unspecified: Secondary | ICD-10-CM | POA: Diagnosis not present

## 2018-11-16 DIAGNOSIS — E875 Hyperkalemia: Secondary | ICD-10-CM | POA: Diagnosis not present

## 2018-11-16 DIAGNOSIS — E1122 Type 2 diabetes mellitus with diabetic chronic kidney disease: Secondary | ICD-10-CM | POA: Diagnosis not present

## 2018-11-16 DIAGNOSIS — E1165 Type 2 diabetes mellitus with hyperglycemia: Secondary | ICD-10-CM | POA: Diagnosis not present

## 2018-11-21 DIAGNOSIS — N183 Chronic kidney disease, stage 3 (moderate): Secondary | ICD-10-CM | POA: Diagnosis not present

## 2018-11-21 DIAGNOSIS — I2510 Atherosclerotic heart disease of native coronary artery without angina pectoris: Secondary | ICD-10-CM | POA: Diagnosis not present

## 2018-11-21 DIAGNOSIS — F411 Generalized anxiety disorder: Secondary | ICD-10-CM | POA: Diagnosis not present

## 2018-11-21 DIAGNOSIS — I10 Essential (primary) hypertension: Secondary | ICD-10-CM | POA: Diagnosis not present

## 2018-11-21 DIAGNOSIS — E782 Mixed hyperlipidemia: Secondary | ICD-10-CM | POA: Diagnosis not present

## 2018-11-21 DIAGNOSIS — E1122 Type 2 diabetes mellitus with diabetic chronic kidney disease: Secondary | ICD-10-CM | POA: Diagnosis not present

## 2018-11-21 DIAGNOSIS — I5020 Unspecified systolic (congestive) heart failure: Secondary | ICD-10-CM | POA: Diagnosis not present

## 2018-11-21 DIAGNOSIS — H9193 Unspecified hearing loss, bilateral: Secondary | ICD-10-CM | POA: Diagnosis not present

## 2018-11-21 DIAGNOSIS — D631 Anemia in chronic kidney disease: Secondary | ICD-10-CM | POA: Diagnosis not present

## 2019-01-04 ENCOUNTER — Telehealth: Payer: Self-pay | Admitting: Cardiovascular Disease

## 2019-01-04 NOTE — Telephone Encounter (Signed)
I understand.  No objections.  Any one over 3 providers in [facility] can provide her with excellent care. MCr

## 2019-01-04 NOTE — Telephone Encounter (Signed)
Patient's Daughter  wants to transfer care from Dr. Sallyanne Kuster to  Any available Doctor at the [facility]. The Pt has limited mobility, and would like to be seen closer to home

## 2019-01-04 NOTE — Telephone Encounter (Signed)
Returned call to patient's daughter [name].She wanted to ask Dr.Croitoru if ok to transfer care to the [place] office.Stated it will be more convenient for her.Advised I will send message to Dr.Croitoru.

## 2019-01-04 NOTE — Telephone Encounter (Signed)
Spoke to patient's daughter [name] Dr.Croitoru's advice given.Scheduler will call back with appointment.

## 2019-03-07 ENCOUNTER — Telehealth: Payer: Self-pay | Admitting: *Deleted

## 2019-03-07 NOTE — Telephone Encounter (Signed)
   Cardiac Questionnaire:    Since your last visit or hospitalization:    1. Have you been having new or worsening chest pain? NO   2. Have you been having new or worsening shortness of breath? NO 3. Have you been having new or worsening leg swelling, wt gain, or increase in abdominal girth (pants fitting more tightly)? NO   4. Have you had any passing out spells?NO    *A YES to any of these questions would result in the appointment being kept. *If all the answers to these questions are NO, we should indicate that given the current situation regarding the worldwide coronarvirus pandemic, at the recommendation of the CDC, we are looking to limit gatherings in our waiting area, and thus will reschedule their appointment beyond four weeks from today.   _____________   Spoke with daughter who states that patient is doing fine and prefers to reschedule appt.

## 2019-03-14 ENCOUNTER — Ambulatory Visit: Payer: Medicare Other | Admitting: Cardiology

## 2019-04-10 DIAGNOSIS — N183 Chronic kidney disease, stage 3 (moderate): Secondary | ICD-10-CM | POA: Diagnosis not present

## 2019-04-10 DIAGNOSIS — E1165 Type 2 diabetes mellitus with hyperglycemia: Secondary | ICD-10-CM | POA: Diagnosis not present

## 2019-04-10 DIAGNOSIS — I10 Essential (primary) hypertension: Secondary | ICD-10-CM | POA: Diagnosis not present

## 2019-04-10 DIAGNOSIS — E782 Mixed hyperlipidemia: Secondary | ICD-10-CM | POA: Diagnosis not present

## 2019-04-13 DIAGNOSIS — Z0000 Encounter for general adult medical examination without abnormal findings: Secondary | ICD-10-CM | POA: Diagnosis not present

## 2019-05-17 DIAGNOSIS — Z0000 Encounter for general adult medical examination without abnormal findings: Secondary | ICD-10-CM | POA: Diagnosis not present

## 2019-05-21 ENCOUNTER — Emergency Department (HOSPITAL_COMMUNITY): Payer: Medicare Other

## 2019-05-21 ENCOUNTER — Emergency Department (HOSPITAL_COMMUNITY)
Admission: EM | Admit: 2019-05-21 | Discharge: 2019-05-21 | Disposition: A | Discharge status: Home / Self Care | Payer: Medicare Other | Attending: Emergency Medicine | Admitting: Emergency Medicine

## 2019-05-21 ENCOUNTER — Other Ambulatory Visit: Payer: Self-pay

## 2019-05-21 ENCOUNTER — Encounter (HOSPITAL_COMMUNITY): Payer: Self-pay

## 2019-05-21 DIAGNOSIS — I10 Essential (primary) hypertension: Secondary | ICD-10-CM | POA: Diagnosis not present

## 2019-05-21 DIAGNOSIS — F0390 Unspecified dementia without behavioral disturbance: Secondary | ICD-10-CM | POA: Diagnosis not present

## 2019-05-21 DIAGNOSIS — R0602 Shortness of breath: Secondary | ICD-10-CM | POA: Diagnosis not present

## 2019-05-21 DIAGNOSIS — N3000 Acute cystitis without hematuria: Secondary | ICD-10-CM | POA: Diagnosis not present

## 2019-05-21 DIAGNOSIS — I252 Old myocardial infarction: Secondary | ICD-10-CM | POA: Insufficient documentation

## 2019-05-21 DIAGNOSIS — Z794 Long term (current) use of insulin: Secondary | ICD-10-CM | POA: Diagnosis not present

## 2019-05-21 DIAGNOSIS — I5043 Acute on chronic combined systolic (congestive) and diastolic (congestive) heart failure: Secondary | ICD-10-CM | POA: Diagnosis not present

## 2019-05-21 DIAGNOSIS — I110 Hypertensive heart disease with heart failure: Secondary | ICD-10-CM | POA: Insufficient documentation

## 2019-05-21 DIAGNOSIS — I2510 Atherosclerotic heart disease of native coronary artery without angina pectoris: Secondary | ICD-10-CM | POA: Diagnosis not present

## 2019-05-21 DIAGNOSIS — Z79899 Other long term (current) drug therapy: Secondary | ICD-10-CM | POA: Insufficient documentation

## 2019-05-21 DIAGNOSIS — R5381 Other malaise: Secondary | ICD-10-CM | POA: Diagnosis not present

## 2019-05-21 DIAGNOSIS — I447 Left bundle-branch block, unspecified: Secondary | ICD-10-CM | POA: Insufficient documentation

## 2019-05-21 DIAGNOSIS — E119 Type 2 diabetes mellitus without complications: Secondary | ICD-10-CM | POA: Diagnosis not present

## 2019-05-21 DIAGNOSIS — R0902 Hypoxemia: Secondary | ICD-10-CM | POA: Diagnosis not present

## 2019-05-21 DIAGNOSIS — Z7982 Long term (current) use of aspirin: Secondary | ICD-10-CM | POA: Diagnosis not present

## 2019-05-21 DIAGNOSIS — R112 Nausea with vomiting, unspecified: Secondary | ICD-10-CM

## 2019-05-21 DIAGNOSIS — R5383 Other fatigue: Secondary | ICD-10-CM | POA: Diagnosis not present

## 2019-05-21 DIAGNOSIS — R109 Unspecified abdominal pain: Secondary | ICD-10-CM | POA: Diagnosis not present

## 2019-05-21 LAB — CBC WITH DIFFERENTIAL/PLATELET
Abs Immature Granulocytes: 0.03 10*3/uL (ref 0.00–0.07)
Basophils Absolute: 0 10*3/uL (ref 0.0–0.1)
Basophils Relative: 0 %
Eosinophils Absolute: 0.1 10*3/uL (ref 0.0–0.5)
Eosinophils Relative: 1 %
HCT: 35 % — ABNORMAL LOW (ref 36.0–46.0)
Hemoglobin: 11 g/dL — ABNORMAL LOW (ref 12.0–15.0)
Immature Granulocytes: 0 %
Lymphocytes Relative: 7 %
Lymphs Abs: 0.5 10*3/uL — ABNORMAL LOW (ref 0.7–4.0)
MCH: 30.9 pg (ref 26.0–34.0)
MCHC: 31.4 g/dL (ref 30.0–36.0)
MCV: 98.3 fL (ref 80.0–100.0)
Monocytes Absolute: 0.3 10*3/uL (ref 0.1–1.0)
Monocytes Relative: 4 %
Neutro Abs: 6.1 10*3/uL (ref 1.7–7.7)
Neutrophils Relative %: 88 %
Platelets: 111 10*3/uL — ABNORMAL LOW (ref 150–400)
RBC: 3.56 MIL/uL — ABNORMAL LOW (ref 3.87–5.11)
RDW: 14.2 % (ref 11.5–15.5)
WBC: 7 10*3/uL (ref 4.0–10.5)
nRBC: 0 % (ref 0.0–0.2)

## 2019-05-21 LAB — TROPONIN I (HIGH SENSITIVITY)
Troponin I (High Sensitivity): 9 ng/L (ref <18)
Troponin I (High Sensitivity): 10 ng/L (ref <18)

## 2019-05-21 LAB — URINALYSIS, ROUTINE W REFLEX MICROSCOPIC
Bilirubin Urine: NEGATIVE
Glucose, UA: >=500 mg/dL — AB
Hgb urine dipstick: NEGATIVE
Ketones, ur: NEGATIVE mg/dL
Leukocytes,Ua: NEGATIVE
Nitrite: POSITIVE — AB
Protein, ur: NEGATIVE mg/dL
Specific Gravity, Urine: 1.013 (ref 1.005–1.030)
pH: 6 (ref 5.0–8.0)

## 2019-05-21 LAB — COMPREHENSIVE METABOLIC PANEL
ALT: 51 U/L — ABNORMAL HIGH (ref 0–44)
AST: 93 U/L — ABNORMAL HIGH (ref 15–41)
Albumin: 3.3 g/dL — ABNORMAL LOW (ref 3.5–5.0)
Alkaline Phosphatase: 98 U/L (ref 38–126)
Anion gap: 12 (ref 5–15)
BUN: 24 mg/dL — ABNORMAL HIGH (ref 8–23)
CO2: 23 mmol/L (ref 22–32)
Calcium: 8.9 mg/dL (ref 8.9–10.3)
Chloride: 103 mmol/L (ref 98–111)
Creatinine, Ser: 1.22 mg/dL — ABNORMAL HIGH (ref 0.44–1.00)
GFR calc Af Amer: 45 mL/min — ABNORMAL LOW (ref >60)
GFR calc non Af Amer: 39 mL/min — ABNORMAL LOW (ref >60)
Glucose, Bld: 299 mg/dL — ABNORMAL HIGH (ref 70–99)
Potassium: 4.1 mmol/L (ref 3.5–5.1)
Sodium: 138 mmol/L (ref 135–145)
Total Bilirubin: 1.5 mg/dL — ABNORMAL HIGH (ref 0.3–1.2)
Total Protein: 6.6 g/dL (ref 6.5–8.1)

## 2019-05-21 LAB — LIPASE, BLOOD: Lipase: 30 U/L (ref 11–51)

## 2019-05-21 MED ORDER — ONDANSETRON 4 MG PO TBDP: 4.0000 mg | ORAL_TABLET | Freq: Three times a day (TID) | ORAL | 0 refills | Status: DC | PRN

## 2019-05-21 MED ORDER — SODIUM CHLORIDE 0.9 % IV BOLUS
500.0000 mL | Freq: Once | INTRAVENOUS | Status: AC
  Administered 2019-05-21: 500 mL via INTRAVENOUS

## 2019-05-21 MED ORDER — ONDANSETRON HCL 4 MG/2ML IJ SOLN
4.0000 mg | Freq: Once | INTRAMUSCULAR | Status: AC
  Administered 2019-05-21: 4 mg via INTRAVENOUS
  Filled 2019-05-21: qty 2

## 2019-05-21 MED ORDER — CEPHALEXIN 250 MG PO CAPS
250.0000 mg | ORAL_CAPSULE | Freq: Once | ORAL | Status: AC
  Administered 2019-05-21: 250 mg via ORAL
  Filled 2019-05-21 (×2): qty 1

## 2019-05-21 MED ORDER — ACETAMINOPHEN 325 MG PO TABS
650.0000 mg | ORAL_TABLET | Freq: Once | ORAL | Status: AC
  Administered 2019-05-21: 650 mg via ORAL
  Filled 2019-05-21: qty 2

## 2019-05-21 MED ORDER — CEPHALEXIN 250 MG PO CAPS: 500.0000 mg | ORAL_CAPSULE | Freq: Two times a day (BID) | ORAL | 0 refills | Status: DC

## 2019-05-21 MED ORDER — ONDANSETRON HCL 4 MG/2ML IJ SOLN
4.0000 mg | Freq: Once | INTRAMUSCULAR | Status: AC
Start: 2019-05-21 — End: 2019-05-21
  Administered 2019-05-21: 4 mg via INTRAVENOUS
  Filled 2019-05-21: qty 2

## 2019-05-21 NOTE — ED Provider Notes (Signed)
[facility] EMERGENCY DEPARTMENT Provider Note   CSN: [id number] Arrival date & time: 05/21/19  1422   History   Chief Complaint Chief Complaint  Patient presents with  . Emesis    HPI Robin Guzman is a 85 y.o. female with a PMH of CHF, Diabetes, HLD, CAD, MI, SVT, Dementia, and UTI presenting with nausea and vomiting onset today at 11am after eating lunch. Patient arrived via EMS from home. Patient reports 2 episodes of vomiting. Patient lives alone and grandson lives next door. Patient reports shortness of breath and fatigue. Patient states nothing makes symptoms better or worse. Patient denies chest pain. Patient denies fever, cough, chills, abdominal pain, diarrhea, or constipation. Patient denies sick exposures or recent travel.   Daughter is a contributing historian and is at bedtime. Daughter states patient has not had any symptoms prior to today. Daughter states patient lives by herself, but family is close by to help.      HPI  Past Medical History:  Diagnosis Date  . Arthritis   . CAD (coronary artery disease), native coronary artery    07/27/17 nonobstructive, EF 25-35% by LV gram  . CHF (congestive heart failure) ([place])   . Dementia ([place])   . Diabetes mellitus without complication ([place])   . Dyslipidemia   . Dyspnea   . Dyspnea on exertion   . History of stress test 07/05/2006   High risk scan cardiac cathe was recommended.  [name] Hx of echocardiogram 04/09/2011   EF 55% Mildly hypertrophic left ventricle with normal systolic function, Moderate (grade II) diastolic dysfunction. Elevated left atrial pressure. No significant valvular abnormalities. No pericardial effusion. Mild to moderate pulmonary arterial hypertension.  . Hypercholesteremia   . Hypertension   . Myocardial infarct ([place])   . Skin cancer    HISTORY OF     Patient Active Problem List   Diagnosis Date Noted  . SVT (supraventricular tachycardia) ([place]) 10/23/2018  . PAT (paroxysmal atrial  tachycardia) ([place]) 04/23/2018  . LBBB (left bundle branch block) 04/23/2018  . Dyslipidemia 04/23/2018  . Atherosclerosis of aorta ([place]) 04/23/2018  . Acute systolic heart failure ([place]) 07/28/2017  . Sundowning 07/28/2017  . NSTEMI (non-ST elevated myocardial infarction) ([place]) 07/26/2017  . Closed 3-part fracture of proximal humerus, left, with routine healing, subsequent encounter 12/02/2016  . UTI (urinary tract infection) 12/25/2013  . Dyspnea 12/25/2013  . Near syncope 12/25/2013  . CAD (coronary artery disease) 12/25/2013  . Chronic diastolic heart failure ([place]) 08/07/2013  . Essential hypertension 08/07/2013  . Controlled type 2 diabetes mellitus without complication, without long-term current use of insulin ([place]) 08/07/2013  . Hypercholesteremia 08/07/2013    Past Surgical History:  Procedure Laterality Date  . ABDOMINAL HYSTERECTOMY    . CATARACT EXTRACTION W/PHACO Right 05/20/2017   Procedure: CATARACT EXTRACTION PHACO AND INTRAOCULAR LENS PLACEMENT RIGHT EYE;  Surgeon: Tonny Branch, MD;  Location: [facility];  Service: Ophthalmology;  Laterality: Right;  CDE: 17.77  . CATARACT EXTRACTION W/PHACO Left 06/21/2017   Procedure: CATARACT EXTRACTION PHACO AND INTRAOCULAR LENS PLACEMENT (IOC);  Surgeon: Tonny Branch, MD;  Location: [facility];  Service: Ophthalmology;  Laterality: Left;  CDE: 8.90  . LEFT HEART CATH AND CORONARY ANGIOGRAPHY N/A 07/27/2017   Procedure: LEFT HEART CATH AND CORONARY ANGIOGRAPHY;  Surgeon: Wellington Hampshire, MD;  Location: [facility];  Service: Cardiovascular;  Laterality: N/A;     OB History    Gravida  3   Para  3   Term  3  Preterm      AB      Living        SAB      TAB      Ectopic      Multiple      Live Births               Home Medications    Prior to Admission medications   Medication Sig Start Date End Date Taking? Authorizing Provider  aspirin EC 81 MG tablet Take 81 mg by mouth daily.   Yes [provider]   atorvastatin (LIPITOR) 10 MG tablet Take 10 mg by mouth daily.   Yes [provider]  carvedilol (COREG) 25 MG tablet Take 1 tablet (25 mg total) by mouth 2 (two) times daily with a meal. 10/23/18  Yes Croitoru, Mihai, MD  glipiZIDE (GLUCOTROL) 5 MG tablet Take 5 mg by mouth 2 (two) times daily before a meal.    Yes [provider]  cephALEXin (KEFLEX) 250 MG capsule Take 2 capsules (500 mg total) by mouth 2 (two) times daily. 05/21/19   Darlin Drop P, PA-C  ondansetron (ZOFRAN ODT) 4 MG disintegrating tablet Take 1 tablet (4 mg total) by mouth every 8 (eight) hours as needed for nausea or vomiting. 05/21/19   Arville Lime, PA-C    Family History Family History  Problem Relation Age of Onset  . Cancer Mother   . Heart Problems Father   . Cancer Brother        brain  . Cancer Sister        brain    Social History Social History   Tobacco Use  . Smoking status: Never Smoker  . Smokeless tobacco: Never Used  Substance Use Topics  . Alcohol use: No  . Drug use: No     Allergies   Daypro [oxaprozin] and Advil [ibuprofen]   Review of Systems Review of Systems  Constitutional: Positive for fatigue. Negative for chills, diaphoresis and fever.  Respiratory: Positive for shortness of breath. Negative for cough.   Cardiovascular: Negative for chest pain.  Gastrointestinal: Positive for nausea and vomiting. Negative for abdominal pain, blood in stool, constipation and diarrhea.  Endocrine: Negative for cold intolerance and heat intolerance.  Genitourinary: Negative for dysuria.  Musculoskeletal: Negative for back pain.  Skin: Negative for rash.  Allergic/Immunologic: Negative for immunocompromised state.  Neurological: Negative for dizziness, syncope, facial asymmetry, weakness and headaches.  Hematological: Negative for adenopathy.    Physical Exam Updated Vital Signs BP 118/62 (BP Location: Right Arm)   Pulse 91   Temp 98.6 F (37 C) (Oral)   Resp  20   SpO2 94%   Physical Exam Vitals signs and nursing note reviewed.  Constitutional:      General: She is not in acute distress.    Appearance: She is well-developed. She is not diaphoretic.     Comments: Patient is hard of hearing, but is able to communicate with loud speaking.  HENT:     Head: Normocephalic and atraumatic.     Mouth/Throat:     Mouth: Mucous membranes are moist.     Pharynx: No posterior oropharyngeal erythema.  Eyes:     Extraocular Movements: Extraocular movements intact.     Conjunctiva/sclera: Conjunctivae normal.     Pupils: Pupils are equal, round, and reactive to light.  Neck:     Musculoskeletal: Normal range of motion.  Cardiovascular:     Rate and Rhythm: Normal rate and regular  rhythm.     Heart sounds: Normal heart sounds. No murmur. No friction rub. No gallop.   Pulmonary:     Effort: Pulmonary effort is normal. No respiratory distress.     Breath sounds: Normal breath sounds. No wheezing or rales.     Comments: Patient is speaking in full sentences without difficulty. Oxygen saturation is 97% on room air.  Abdominal:     Palpations: Abdomen is soft.     Tenderness: There is no abdominal tenderness. There is no right CVA tenderness, left CVA tenderness, guarding or rebound.  Musculoskeletal: Normal range of motion.  Skin:    General: Skin is warm.     Findings: No erythema or rash.  Neurological:     Mental Status: She is alert.     ED Treatments / Results  Labs (all labs ordered are listed, but only abnormal results are displayed) Labs Reviewed  COMPREHENSIVE METABOLIC PANEL - Abnormal; Notable for the following components:      Result Value   Glucose, Bld 299 (*)    BUN 24 (*)    Creatinine, Ser 1.22 (*)    Albumin 3.3 (*)    AST 93 (*)    ALT 51 (*)    Total Bilirubin 1.5 (*)    GFR calc non Af Amer 39 (*)    GFR calc Af Amer 45 (*)    All other components within normal limits  CBC WITH DIFFERENTIAL/PLATELET - Abnormal;  Notable for the following components:   RBC 3.56 (*)    Hemoglobin 11.0 (*)    HCT 35.0 (*)    Platelets 111 (*)    Lymphs Abs 0.5 (*)    All other components within normal limits  URINALYSIS, ROUTINE W REFLEX MICROSCOPIC - Abnormal; Notable for the following components:   Glucose, UA >=500 (*)    Nitrite POSITIVE (*)    Bacteria, UA FEW (*)    All other components within normal limits  URINE CULTURE  TROPONIN I (HIGH SENSITIVITY)  TROPONIN I (HIGH SENSITIVITY)  LIPASE, BLOOD    EKG EKG Interpretation  Date/Time:  Monday May 21 2019 14:35:05 EDT Ventricular Rate:  72 PR Interval:    QRS Duration: 138 QT Interval:  427 QTC Calculation: 468 R Axis:   -26 Text Interpretation:  Sinus rhythm Left bundle branch block no significant change since May 2019 Confirmed by Sherwood Gambler [phone]) on 05/21/2019 2:59:05 PM   Radiology Dg Abdomen 1 View  Result Date: 05/21/2019 CLINICAL DATA:  85 year old female abdominal pain nausea and vomiting today. Questionable free intraperitoneal air on portable chest today. EXAM: ABDOMEN - 1 VIEW COMPARISON:  Portable chest today reported separately. FINDINGS: PA upright view at 1623 hours. There appears to be mild eventration of the right hemidiaphragm (normal variant) rather than pneumoperitoneum. No convincing free air. Negative visible bowel gas pattern. Improved lung volumes. Stable visible mediastinum. No acute osseous abnormality identified. IMPRESSION: No pneumoperitoneum suspected.  Normal visible bowel gas pattern. Electronically Signed   By: Genevie Ann M.D.   On: 05/21/2019 16:48   Dg Chest Port 1 View  Result Date: 05/21/2019 CLINICAL DATA:  Shortness of breath. EXAM: PORTABLE CHEST 1 VIEW COMPARISON:  05/28/2018. FINDINGS: Cardiomegaly with normal pulmonary vascularity. No focal infiltrate. Low lung volumes with bibasilar atelectasis. Linear density over the right lung base is most likely atelectasis. Abdominal series suggested to exclude free  intraperitoneal air. No pleural effusion or pneumothorax. IMPRESSION: 1.  Cardiomegaly with normal pulmonary vascularity. 2. Low lung  volumes with mild bibasilar atelectasis. Linear density noted over the right lung base most likely atelectasis. Abdominal series suggested to exclude intraperitoneal air. Critical Value/emergent results were called by telephone at the time of interpretation on 05/21/2019 at 3:37 pm to nurse Wells Guiles, who verbally acknowledged these results. Electronically Signed   By: Marcello Moores  Register   On: 05/21/2019 15:40    Procedures Procedures (including critical care time)  Medications Ordered in ED Medications  ondansetron (ZOFRAN) injection 4 mg (4 mg Intravenous Given 05/21/19 1532)  sodium chloride 0.9 % bolus 500 mL (0 mLs Intravenous Stopped 05/21/19 1745)  ondansetron (ZOFRAN) injection 4 mg (4 mg Intravenous Given 05/21/19 1835)  acetaminophen (TYLENOL) tablet 650 mg (650 mg Oral Given 05/21/19 1928)  cephALEXin (KEFLEX) capsule 250 mg (250 mg Oral Given 05/21/19 2100)     Initial Impression / Assessment and Plan / ED Course  I have reviewed the triage vital signs and the nursing notes.  Pertinent labs & imaging results that were available during my care of the patient were reviewed by me and considered in my medical decision making (see chart for details).  Clinical Course as of May 20 2120  Mon May 21, 2019  1549 WBCs are within normal limits.  WBC: 7.0 [AH]  1549 Low hemoglobin noted at 11.   Hemoglobin(!): 11.0 [AH]  1549 Low platelets noted at 111.   Platelets(!): 111 [AH]  1550 1. Cardiomegaly with normal pulmonary vascularity. 2. Low lung volumes with mild bibasilar atelectasis. Linear density noted over the right lung base most likely atelectasis. Abdominal series suggested to exclude intraperitoneal air.    DG Chest Port 1 View [AH]  1607 UA is positive for nitrite, few bacteria, and glucose. Negative leukocytes, WBCs, or RBCs.  Nitrite(!): POSITIVE  [AH]  1608 Creatinine elevated at 1.22. This appears similar to previous values.  Creatinine(!): 1.22 [AH]  1608 Glucose elevated at 299. No ketones and no anion gap noted.  Glucose(!): 299 [AH]  1813 Troponin <18 and change <5. Initial troponin is 9 and second troponin is 10. Patient has not had chest pain while in the ER.  Troponin I (High Sensitivity) [AH]  1906 Patient had an episode of vomiting. Ordered additional zofran. Patient is now febrile.    [AH]  1956 Reassessed patient. Patient continues to endorse no pain. Patient reports nausea has resolved with antiemetics. Patient was able to tolerate food and water without difficulty.   [AH]    Clinical Course User Index [AH] Arville Lime, [place]      Patient presents with nausea and vomiting. Labs, vitals, and imaging reviewed. Patient was given gentle IVF and zofran with improvement. Patient became febrile while in the ER with a rectal temperature of 101F. Provided tylenol and additional zofran. Patient was able to tolerate PO intake without difficulty. WBCs are within normal limits. UA reveals nitrites and few bacteria. Ordered urine culture. Patient denies urinary symptoms at this time. Shared decision making discussed with daughter and patient. Daughter prefers to start UTI treatment at this time. Will provide antibiotics in the ER due to fever. Prescribed antibiotics and antiemetics. Discussed return precautions with patient and daughter. Advised patient to follow up with PCP in 2 days. Patient and daughter state they understand and agree with plan.   Findings and plan of care discussed with supervising physician Dr. Eulis Foster who personally evaluated and examined this patient.  Final Clinical Impressions(s) / ED Diagnoses   Final diagnoses:  Nausea and vomiting, intractability of vomiting not  specified, unspecified vomiting type  Acute cystitis without hematuria    ED Discharge Orders         Ordered    cephALEXin (KEFLEX) 250 MG  capsule  2 times daily     05/21/19 2120    ondansetron (ZOFRAN ODT) 4 MG disintegrating tablet  Every 8 hours PRN     05/21/19 2120           Arville Lime, [place] 05/21/19 2121    Daleen Bo, MD 05/22/19 1714

## 2019-05-21 NOTE — ED Notes (Signed)
PT vomiting at this time. Daughter at bedside. PA made aware and new order for zofran.

## 2019-05-21 NOTE — ED Notes (Signed)
Water given with Tylenol

## 2019-05-21 NOTE — ED Provider Notes (Signed)
  Face-to-face evaluation   History: Patient reportedly here for vomiting.  Gave her at 4:55 PM.  She is completely deaf, and does not have her hearing aids with her.  I have asked nursing to get someone to bring her hearing aids so we can communicate with her.  Physical exam: Alert elderly female.  She is in no apparent distress.  She states that she wants to go home.  Heart regular rate and rhythm without murmur, lungs clear to auscultation.  Abdomen soft and nontender to palpation.  Medical screening examination/treatment/procedure(s) were conducted as a shared visit with non-physician practitioner(s) and myself.  I personally evaluated the patient during the encounter    Daleen Bo, MD 05/22/19 1714

## 2019-05-21 NOTE — ED Triage Notes (Signed)
EMS says pt started feeling nauseated and vomited twice after lunch today.  C/O feeling "blah."  Denies cough or fever.  Per ems, pt lives at home alone and grandson lives next door.  Denies pain.  Denies diarrhea.

## 2019-05-21 NOTE — Discharge Instructions (Addendum)
You have been seen today for nausea and vomiting. Please read and follow all provided instructions.   1. Medications: Keflex (antibiotic), zofran (for nausea), usual home medications 2. Treatment: rest, drink plenty of fluids 3. Follow Up: Please follow up with your primary doctor in 2 days for discussion of your diagnoses and further evaluation after today's visit; if you do not have a primary care doctor use the resource guide provided to find one; Please return to the ER for any new or worsening symptoms. Please obtain all of your results from medical records or have your doctors office obtain the results - share them with your doctor - you should be seen at your doctors office. Call today to arrange your follow up.   Take medications as prescribed. Please review all of the medicines and only take them if you do not have an allergy to them. Return to the emergency room for worsening condition or new concerning symptoms. Follow up with your regular doctor. If you don't have a regular doctor use one of the numbers below to establish a primary care doctor.  Please be aware that if you are taking birth control pills, taking other prescriptions, ESPECIALLY ANTIBIOTICS may make the birth control ineffective - if this is the case, either do not engage in sexual activity or use alternative methods of birth control such as condoms until you have finished the medicine and your family doctor says it is OK to restart them. If you are on a blood thinner such as COUMADIN, be aware that any other medicine that you take may cause the coumadin to either work too much, or not enough - you should have your coumadin level rechecked in next 7 days if this is the case.  ?  It is also a possibility that you have an allergic reaction to any of the medicines that you have been prescribed - Everybody reacts differently to medications and while MOST people have no trouble with most medicines, you may have a reaction such as  nausea, vomiting, rash, swelling, shortness of breath. If this is the case, please stop taking the medicine immediately and contact your physician.  ?  You should return to the ER if you develop severe or worsening symptoms.   Emergency Department Resource Guide 1) Find a Doctor and Pay Out of Pocket Although you won't have to find out who is covered by your insurance plan, it is a good idea to ask around and get recommendations. You will then need to call the office and see if the doctor you have chosen will accept you as a new patient and what types of options they offer for patients who are self-pay. Some doctors offer discounts or will set up payment plans for their patients who do not have insurance, but you will need to ask so you aren't surprised when you get to your appointment.  2) Contact Your Local Health Department Not all health departments have doctors that can see patients for sick visits, but many do, so it is worth a call to see if yours does. If you don't know where your local health department is, you can check in your phone book. The CDC also has a tool to help you locate your state's health department, and many state websites also have listings of all of their local health departments.  3) Find a [facility] If your illness is not likely to be very severe or complicated, you may want to try a walk in clinic.  These are popping up all over the country in pharmacies, drugstores, and shopping centers. They're usually staffed by nurse practitioners or physician assistants that have been trained to treat common illnesses and complaints. They're usually fairly quick and inexpensive. However, if you have serious medical issues or chronic medical problems, these are probably not your best option.  No Primary Care Doctor: Call Health Connect at  [phone] - they can help you locate a primary care doctor that  accepts your insurance, provides certain services, etc. Physician Referral  Service- [phone]  Chronic Pain Problems: Organization         Address  Phone   Notes  [facility]  [phone] Patients need to be referred by their primary care doctor.   Medication Assistance: Organization         Address  Phone   Notes  [facility] Medication [facility] [address] [place], [place] [address] [phone] --Must be a resident of [facility] -- Must have NO insurance coverage whatsoever (no Medicaid/ Medicare, etc.) -- The pt. MUST have a primary care doctor that directs their care regularly and follows them in the community   [facility]  [phone]   [employer]  [phone]    Agencies that provide inexpensive medical care: Organization         Address  Phone   Notes  [place]  [phone]   [facility]    [phone]   [facility] [place],  [address] [phone]   [place] [address], [place] [phone]   [facility]    [phone]   [facility]    [phone]   [place] and [place] [address], [place] Phone:  [phone], Fax:  [phone] Hours of Operation:  9 am - 6 pm, M-F.  Also accepts Medicaid/Medicare and self-pay.  [facility] for [place] [address], [address], [place] Phone: [phone], Fax: [phone]. Hours of Operation:  8:30 am - 5:30 pm, M-F.  Also accepts Medicaid and self-pay.  [facility] [address] [place] Phone: [phone]   [place], [place], [place] [phone], Ext. [phone] Mondays & Thursdays: 7-9 AM.  First 15 patients are seen on a first come, first serve basis.    [place] Providers:  Organization         Address  Phone   Notes  [facility] [address], [place] [phone] Also accepts self-pay patients.  [facility] [address], [place]  [phone]   [place], [address], [place] [phone]   [facility] [address], [place] [phone]   Lucianne Lei [address], [place]   [phone] Only accepts [place] patients after they have their name applied to their card.   Self-Pay (no insurance) in [facility]:  Organization         Address  Phone   Notes  Sickle Cell Patients, [facility] [place] [phone]   [facility] [place] [phone]   [facility] [place]  [place], [address], [place] [phone]   [facility]/Dr. Osei-Bonsu  [address] [place] or [address], [place] [phone] Phone number for both [place] and [place] locations is the same.  Urgent Medical and [facility] [address] [place] [phone]   [facility] [address] [place] or [address] [phone]   [facility] [address], [place] [phone], phone; [phone], fax Sees patients 1st and 3rd Saturday of every month.  Must not qualify for public or private insurance (i.e. Medicaid, Medicare, [place] Health Choice, Veterans' Benefits)  Household income should be no more than 200% of the poverty level The clinic cannot treat you if you are pregnant or think you are pregnant  Sexually transmitted diseases are not treated at the clinic.

## 2019-05-23 LAB — URINE CULTURE: Culture: >=100000 — AB

## 2019-05-24 ENCOUNTER — Telehealth: Payer: Self-pay

## 2019-05-24 NOTE — Telephone Encounter (Signed)
Post ED Visit - Positive Culture Follow-up  Culture report reviewed by antimicrobial stewardship pharmacist: [facility] [facility] Team []  Elenor Quinones, Pharm.D. []  Heide Guile, Pharm.D., BCPS AQ-ID []  Parks Neptune, Pharm.D., BCPS []  Alycia Rossetti, Pharm.D., BCPS []  [place], Pharm.D., BCPS, AAHIVP []  Legrand Como, Pharm.D., BCPS, AAHIVP []  Salome Arnt, PharmD, BCPS []  Johnnette Gourd, PharmD, BCPS []  Hughes Better, PharmD, BCPS []  Leeroy Cha, PharmD []  Laqueta Linden, PharmD, BCPS []  Albertina Parr, PharmD [place] Team []  Leodis Sias, PharmD []  Lindell Spar, PharmD []  Royetta Asal, PharmD []  Graylin Shiver, Rph []  Rema Fendt) Glennon Mac, PharmD []  Arlyn Dunning, PharmD []  Netta Cedars, PharmD []  Dia Sitter, PharmD []  Leone Haven, PharmD []  Gretta Arab, PharmD []  Theodis Shove, PharmD []  Peggyann Juba, PharmD []  Reuel Boom, PharmD   Positive urine culture Treated with Cephalexin, organism sensitive to the same and no further patient follow-up is required at this time.  Genia Del 05/24/2019, 10:22 AM

## 2019-05-27 ENCOUNTER — Emergency Department (HOSPITAL_COMMUNITY): Payer: Medicare Other

## 2019-05-27 ENCOUNTER — Encounter (HOSPITAL_COMMUNITY): Payer: Self-pay | Admitting: Emergency Medicine

## 2019-05-27 ENCOUNTER — Other Ambulatory Visit: Payer: Self-pay

## 2019-05-27 ENCOUNTER — Emergency Department (HOSPITAL_COMMUNITY)
Admission: EM | Admit: 2019-05-27 | Discharge: 2019-05-27 | Disposition: A | Discharge status: Home / Self Care | Payer: Medicare Other | Attending: Emergency Medicine | Admitting: Emergency Medicine

## 2019-05-27 DIAGNOSIS — R0602 Shortness of breath: Secondary | ICD-10-CM | POA: Insufficient documentation

## 2019-05-27 DIAGNOSIS — I2510 Atherosclerotic heart disease of native coronary artery without angina pectoris: Secondary | ICD-10-CM | POA: Diagnosis not present

## 2019-05-27 DIAGNOSIS — Z7982 Long term (current) use of aspirin: Secondary | ICD-10-CM | POA: Diagnosis not present

## 2019-05-27 DIAGNOSIS — I509 Heart failure, unspecified: Secondary | ICD-10-CM | POA: Diagnosis not present

## 2019-05-27 DIAGNOSIS — I110 Hypertensive heart disease with heart failure: Secondary | ICD-10-CM | POA: Insufficient documentation

## 2019-05-27 DIAGNOSIS — Z79899 Other long term (current) drug therapy: Secondary | ICD-10-CM | POA: Diagnosis not present

## 2019-05-27 DIAGNOSIS — Z20828 Contact with and (suspected) exposure to other viral communicable diseases: Secondary | ICD-10-CM | POA: Diagnosis not present

## 2019-05-27 DIAGNOSIS — Z85828 Personal history of other malignant neoplasm of skin: Secondary | ICD-10-CM | POA: Insufficient documentation

## 2019-05-27 DIAGNOSIS — R0689 Other abnormalities of breathing: Secondary | ICD-10-CM | POA: Diagnosis not present

## 2019-05-27 DIAGNOSIS — E1165 Type 2 diabetes mellitus with hyperglycemia: Secondary | ICD-10-CM | POA: Diagnosis not present

## 2019-05-27 DIAGNOSIS — Z209 Contact with and (suspected) exposure to unspecified communicable disease: Secondary | ICD-10-CM | POA: Diagnosis not present

## 2019-05-27 DIAGNOSIS — I447 Left bundle-branch block, unspecified: Secondary | ICD-10-CM | POA: Diagnosis not present

## 2019-05-27 LAB — HEPATIC FUNCTION PANEL
ALT: 50 U/L — ABNORMAL HIGH (ref 0–44)
AST: 43 U/L — ABNORMAL HIGH (ref 15–41)
Albumin: 3.2 g/dL — ABNORMAL LOW (ref 3.5–5.0)
Alkaline Phosphatase: 156 U/L — ABNORMAL HIGH (ref 38–126)
Bilirubin, Direct: 0.8 mg/dL — ABNORMAL HIGH (ref 0.0–0.2)
Indirect Bilirubin: 1.1 mg/dL — ABNORMAL HIGH (ref 0.3–0.9)
Total Bilirubin: 1.9 mg/dL — ABNORMAL HIGH (ref 0.3–1.2)
Total Protein: 6.5 g/dL (ref 6.5–8.1)

## 2019-05-27 LAB — BASIC METABOLIC PANEL
Anion gap: 10 (ref 5–15)
BUN: 18 mg/dL (ref 8–23)
CO2: 23 mmol/L (ref 22–32)
Calcium: 8.7 mg/dL — ABNORMAL LOW (ref 8.9–10.3)
Chloride: 106 mmol/L (ref 98–111)
Creatinine, Ser: 1.35 mg/dL — ABNORMAL HIGH (ref 0.44–1.00)
GFR calc Af Amer: 40 mL/min — ABNORMAL LOW (ref >60)
GFR calc non Af Amer: 35 mL/min — ABNORMAL LOW (ref >60)
Glucose, Bld: 263 mg/dL — ABNORMAL HIGH (ref 70–99)
Potassium: 4.2 mmol/L (ref 3.5–5.1)
Sodium: 139 mmol/L (ref 135–145)

## 2019-05-27 LAB — DIFFERENTIAL
Abs Immature Granulocytes: 0.06 10*3/uL (ref 0.00–0.07)
Basophils Absolute: 0 10*3/uL (ref 0.0–0.1)
Basophils Relative: 0 %
Eosinophils Absolute: 0.1 10*3/uL (ref 0.0–0.5)
Eosinophils Relative: 1 %
Immature Granulocytes: 1 %
Lymphocytes Relative: 8 %
Lymphs Abs: 0.6 10*3/uL — ABNORMAL LOW (ref 0.7–4.0)
Monocytes Absolute: 0.4 10*3/uL (ref 0.1–1.0)
Monocytes Relative: 5 %
Neutro Abs: 5.6 10*3/uL (ref 1.7–7.7)
Neutrophils Relative %: 85 %

## 2019-05-27 LAB — TROPONIN I (HIGH SENSITIVITY): Troponin I (High Sensitivity): 7 ng/L (ref <18)

## 2019-05-27 LAB — CBC
HCT: 33.4 % — ABNORMAL LOW (ref 36.0–46.0)
Hemoglobin: 10.5 g/dL — ABNORMAL LOW (ref 12.0–15.0)
MCH: 31.1 pg (ref 26.0–34.0)
MCHC: 31.4 g/dL (ref 30.0–36.0)
MCV: 98.8 fL (ref 80.0–100.0)
Platelets: 126 10*3/uL — ABNORMAL LOW (ref 150–400)
RBC: 3.38 MIL/uL — ABNORMAL LOW (ref 3.87–5.11)
RDW: 14.6 % (ref 11.5–15.5)
WBC: 6.7 10*3/uL (ref 4.0–10.5)
nRBC: 0 % (ref 0.0–0.2)

## 2019-05-27 LAB — SARS CORONAVIRUS 2 BY RT PCR (HOSPITAL ORDER, PERFORMED IN ~~LOC~~ HOSPITAL LAB): SARS Coronavirus 2: NEGATIVE

## 2019-05-27 LAB — D-DIMER, QUANTITATIVE: D-Dimer, Quant: 0.56 ug/mL-FEU — ABNORMAL HIGH (ref 0.00–0.50)

## 2019-05-27 NOTE — ED Provider Notes (Signed)
[facility] EMERGENCY DEPARTMENT Provider Note   CSN: [id number] Arrival date & time: 05/27/19  1949     History   Chief Complaint Chief Complaint  Patient presents with  . Shortness of Breath    HPI Robin Guzman is a 86 y.o. female.     Patient states she was short of breath and had a near syncopal episode.  The history is provided by the patient. No language interpreter was used.  Shortness of Breath Severity:  Moderate Onset quality:  Sudden Timing:  Intermittent Progression:  Resolved Chronicity:  New Context: not activity   Relieved by:  Nothing Worsened by:  Nothing Ineffective treatments:  None tried Associated symptoms: no abdominal pain, no chest pain, no cough, no headaches and no rash     Past Medical History:  Diagnosis Date  . Arthritis   . CAD (coronary artery disease), native coronary artery    07/27/17 nonobstructive, EF 25-35% by LV gram  . CHF (congestive heart failure) ([place])   . Dementia ([place])   . Diabetes mellitus without complication ([place])   . Dyslipidemia   . Dyspnea   . Dyspnea on exertion   . History of stress test 07/05/2006   High risk scan cardiac cathe was recommended.  [name] Hx of echocardiogram 04/09/2011   EF 55% Mildly hypertrophic left ventricle with normal systolic function, Moderate (grade II) diastolic dysfunction. Elevated left atrial pressure. No significant valvular abnormalities. No pericardial effusion. Mild to moderate pulmonary arterial hypertension.  . Hypercholesteremia   . Hypertension   . Myocardial infarct ([place])   . Skin cancer    HISTORY OF     Patient Active Problem List   Diagnosis Date Noted  . SVT (supraventricular tachycardia) ([place]) 10/23/2018  . PAT (paroxysmal atrial tachycardia) ([place]) 04/23/2018  . LBBB (left bundle branch block) 04/23/2018  . Dyslipidemia 04/23/2018  . Atherosclerosis of aorta ([place]) 04/23/2018  . Acute systolic heart failure ([place]) 07/28/2017  . Sundowning 07/28/2017  . NSTEMI  (non-ST elevated myocardial infarction) ([place]) 07/26/2017  . Closed 3-part fracture of proximal humerus, left, with routine healing, subsequent encounter 12/02/2016  . UTI (urinary tract infection) 12/25/2013  . Dyspnea 12/25/2013  . Near syncope 12/25/2013  . CAD (coronary artery disease) 12/25/2013  . Chronic diastolic heart failure ([place]) 08/07/2013  . Essential hypertension 08/07/2013  . Controlled type 2 diabetes mellitus without complication, without long-term current use of insulin ([place]) 08/07/2013  . Hypercholesteremia 08/07/2013    Past Surgical History:  Procedure Laterality Date  . ABDOMINAL HYSTERECTOMY    . CATARACT EXTRACTION W/PHACO Right 05/20/2017   Procedure: CATARACT EXTRACTION PHACO AND INTRAOCULAR LENS PLACEMENT RIGHT EYE;  Surgeon: Tonny Branch, MD;  Location: [facility];  Service: Ophthalmology;  Laterality: Right;  CDE: 17.77  . CATARACT EXTRACTION W/PHACO Left 06/21/2017   Procedure: CATARACT EXTRACTION PHACO AND INTRAOCULAR LENS PLACEMENT (IOC);  Surgeon: Tonny Branch, MD;  Location: [facility];  Service: Ophthalmology;  Laterality: Left;  CDE: 8.90  . LEFT HEART CATH AND CORONARY ANGIOGRAPHY N/A 07/27/2017   Procedure: LEFT HEART CATH AND CORONARY ANGIOGRAPHY;  Surgeon: Wellington Hampshire, MD;  Location: [place] [facility];  Service: Cardiovascular;  Laterality: N/A;     OB History    Gravida  3   Para  3   Term  3   Preterm      AB      Living        SAB      TAB  Ectopic      Multiple      Live Births               Home Medications    Prior to Admission medications   Medication Sig Start Date End Date Taking? Authorizing Provider  aspirin EC 81 MG tablet Take 81 mg by mouth daily.   Yes [provider]  atorvastatin (LIPITOR) 10 MG tablet Take 10 mg by mouth daily.   Yes [provider]  carvedilol (COREG) 25 MG tablet Take 1 tablet (25 mg total) by mouth 2 (two) times daily with a meal. 10/23/18  Yes Croitoru, Mihai, MD   cephALEXin (KEFLEX) 250 MG capsule Take 2 capsules (500 mg total) by mouth 2 (two) times daily. 05/21/19  Yes Hernandez, Ana P, PA-C  glipiZIDE (GLUCOTROL) 5 MG tablet Take 5 mg by mouth 2 (two) times daily before a meal.    Yes [provider]  ondansetron (ZOFRAN ODT) 4 MG disintegrating tablet Take 1 tablet (4 mg total) by mouth every 8 (eight) hours as needed for nausea or vomiting. 05/21/19  Yes Arville Lime, PA-C    Family History Family History  Problem Relation Age of Onset  . Cancer Mother   . Heart Problems Father   . Cancer Brother        brain  . Cancer Sister        brain    Social History Social History   Tobacco Use  . Smoking status: Never Smoker  . Smokeless tobacco: Never Used  Substance Use Topics  . Alcohol use: No  . Drug use: No     Allergies   Daypro [oxaprozin] and Advil [ibuprofen]   Review of Systems Review of Systems  Constitutional: Negative for appetite change and fatigue.  HENT: Negative for congestion, ear discharge and sinus pressure.   Eyes: Negative for discharge.  Respiratory: Positive for shortness of breath. Negative for cough.   Cardiovascular: Negative for chest pain.  Gastrointestinal: Negative for abdominal pain and diarrhea.  Genitourinary: Negative for frequency and hematuria.  Musculoskeletal: Negative for back pain.  Skin: Negative for rash.  Neurological: Negative for seizures and headaches.  Psychiatric/Behavioral: Negative for hallucinations.     Physical Exam Updated Vital Signs BP (!) 162/75 (BP Location: Left Arm)   Pulse 91   Temp 98.3 F (36.8 C) (Oral)   Resp 18   Ht 5' (1.524 m)   Wt 69 kg   SpO2 96%   BMI 29.71 kg/m   Physical Exam Vitals signs and nursing note reviewed.  Constitutional:      Appearance: She is well-developed.  HENT:     Head: Normocephalic.     Nose: Nose normal.  Eyes:     General: No scleral icterus.    Conjunctiva/sclera: Conjunctivae normal.  Neck:      Musculoskeletal: Neck supple.     Thyroid: No thyromegaly.  Cardiovascular:     Rate and Rhythm: Normal rate and regular rhythm.     Heart sounds: No murmur. No friction rub. No gallop.   Pulmonary:     Breath sounds: No stridor. No wheezing or rales.  Chest:     Chest wall: No tenderness.  Abdominal:     General: There is no distension.     Tenderness: There is no abdominal tenderness. There is no rebound.  Musculoskeletal: Normal range of motion.  Lymphadenopathy:     Cervical: No cervical adenopathy.  Skin:    Findings: No  erythema or rash.  Neurological:     Mental Status: She is oriented to person, place, and time.     Motor: No abnormal muscle tone.     Coordination: Coordination normal.  Psychiatric:        Behavior: Behavior normal.      ED Treatments / Results  Labs (all labs ordered are listed, but only abnormal results are displayed) Labs Reviewed  BASIC METABOLIC PANEL - Abnormal; Notable for the following components:      Result Value   Glucose, Bld 263 (*)    Creatinine, Ser 1.35 (*)    Calcium 8.7 (*)    GFR calc non Af Amer 35 (*)    GFR calc Af Amer 40 (*)    All other components within normal limits  CBC - Abnormal; Notable for the following components:   RBC 3.38 (*)    Hemoglobin 10.5 (*)    HCT 33.4 (*)    Platelets 126 (*)    All other components within normal limits  DIFFERENTIAL - Abnormal; Notable for the following components:   Lymphs Abs 0.6 (*)    All other components within normal limits  HEPATIC FUNCTION PANEL - Abnormal; Notable for the following components:   Albumin 3.2 (*)    AST 43 (*)    ALT 50 (*)    Alkaline Phosphatase 156 (*)    Total Bilirubin 1.9 (*)    Bilirubin, Direct 0.8 (*)    Indirect Bilirubin 1.1 (*)    All other components within normal limits  D-DIMER, QUANTITATIVE (NOT AT [facility]) - Abnormal; Notable for the following components:   D-Dimer, Quant 0.56 (*)    All other components within normal limits  SARS  CORONAVIRUS 2 (HOSPITAL ORDER, [place] [facility])  TROPONIN I (HIGH SENSITIVITY)  TROPONIN I (HIGH SENSITIVITY)  URINALYSIS, ROUTINE W REFLEX MICROSCOPIC    EKG None  Radiology Dg Chest 1 View  Result Date: 05/27/2019 CLINICAL DATA:  Shortness of breath.  Weakness. EXAM: CHEST  1 VIEW COMPARISON:  05/21/2019 FINDINGS: Patient rotated right. Mild cardiomegaly. No pleural effusion or pneumothorax. No congestive failure. Clear lungs. Numerous leads and wires project over the chest. IMPRESSION: Mild cardiomegaly, without acute disease. Electronically Signed   By: Abigail Miyamoto M.D.   On: 05/27/2019 20:42    Procedures Procedures (including critical care time)  Medications Ordered in ED Medications - No data to display   Initial Impression / Assessment and Plan / ED Course  I have reviewed the triage vital signs and the nursing notes.  Pertinent labs & imaging results that were available during my care of the patient were reviewed by me and considered in my medical decision making (see chart for details).       Patient with shortness of breath and near syncopal episode.  Patient states that she does not want to stay any longer and is leaving AMA  Final Clinical Impressions(s) / ED Diagnoses   Final diagnoses:  SOB (shortness of breath)    ED Discharge Orders    None       Milton Ferguson, MD 05/27/19 2208

## 2019-05-27 NOTE — ED Triage Notes (Signed)
Presents with complaint of weakness as well as shortness of breath for the last 30 minutes  [facility] IV 20 g L AC  Fluid bolus NS 450

## 2019-05-27 NOTE — ED Notes (Signed)
Pt reports at home, became short of breath  Then became anxious   Made it worse   Recent UTI   Here several weeks ago

## 2019-05-27 NOTE — ED Notes (Signed)
Pt told nurse to take off leads and take out IV pt states "I havent heart anything and I want to go some where else" Nurse told patient "we have done several testes and the doctor will be seeing you shortly about those tests" Pt stated she "did not care and that she wanted to leave now with or without a wheelchair." Pt ambulated to the bathroom, IV removed and Leads taken off.  Pt requested to go to waiting room. Nurse contacted daughter and told her of situation. Pt states she does not want to go back to her room to wait for her daughter.

## 2019-05-27 NOTE — ED Notes (Signed)
Pt refusing to sign AMA. Pt threatening to walk out without assistance

## 2019-05-30 DIAGNOSIS — Z0000 Encounter for general adult medical examination without abnormal findings: Secondary | ICD-10-CM | POA: Diagnosis not present

## 2019-06-12 DIAGNOSIS — I5020 Unspecified systolic (congestive) heart failure: Secondary | ICD-10-CM | POA: Diagnosis not present

## 2019-06-12 DIAGNOSIS — M25511 Pain in right shoulder: Secondary | ICD-10-CM | POA: Diagnosis not present

## 2019-06-12 DIAGNOSIS — D631 Anemia in chronic kidney disease: Secondary | ICD-10-CM | POA: Diagnosis not present

## 2019-06-12 DIAGNOSIS — F411 Generalized anxiety disorder: Secondary | ICD-10-CM | POA: Diagnosis not present

## 2019-06-12 DIAGNOSIS — N183 Chronic kidney disease, stage 3 (moderate): Secondary | ICD-10-CM | POA: Diagnosis not present

## 2019-06-12 DIAGNOSIS — E782 Mixed hyperlipidemia: Secondary | ICD-10-CM | POA: Diagnosis not present

## 2019-06-12 DIAGNOSIS — I129 Hypertensive chronic kidney disease with stage 1 through stage 4 chronic kidney disease, or unspecified chronic kidney disease: Secondary | ICD-10-CM | POA: Diagnosis not present

## 2019-06-12 DIAGNOSIS — H9193 Unspecified hearing loss, bilateral: Secondary | ICD-10-CM | POA: Diagnosis not present

## 2019-06-12 DIAGNOSIS — E1122 Type 2 diabetes mellitus with diabetic chronic kidney disease: Secondary | ICD-10-CM | POA: Diagnosis not present

## 2019-06-12 DIAGNOSIS — I10 Essential (primary) hypertension: Secondary | ICD-10-CM | POA: Diagnosis not present

## 2019-06-12 DIAGNOSIS — I2510 Atherosclerotic heart disease of native coronary artery without angina pectoris: Secondary | ICD-10-CM | POA: Diagnosis not present

## 2019-06-25 ENCOUNTER — Ambulatory Visit: Payer: Medicare Other | Admitting: Cardiology

## 2019-07-05 DIAGNOSIS — N183 Chronic kidney disease, stage 3 (moderate): Secondary | ICD-10-CM | POA: Diagnosis not present

## 2019-07-05 DIAGNOSIS — I10 Essential (primary) hypertension: Secondary | ICD-10-CM | POA: Diagnosis not present

## 2019-07-05 DIAGNOSIS — D649 Anemia, unspecified: Secondary | ICD-10-CM | POA: Diagnosis not present

## 2019-07-05 DIAGNOSIS — D509 Iron deficiency anemia, unspecified: Secondary | ICD-10-CM | POA: Diagnosis not present

## 2019-07-05 DIAGNOSIS — E1165 Type 2 diabetes mellitus with hyperglycemia: Secondary | ICD-10-CM | POA: Diagnosis not present

## 2019-07-05 DIAGNOSIS — D631 Anemia in chronic kidney disease: Secondary | ICD-10-CM | POA: Diagnosis not present

## 2019-07-05 DIAGNOSIS — E1122 Type 2 diabetes mellitus with diabetic chronic kidney disease: Secondary | ICD-10-CM | POA: Diagnosis not present

## 2019-07-10 DIAGNOSIS — N183 Chronic kidney disease, stage 3 (moderate): Secondary | ICD-10-CM | POA: Diagnosis not present

## 2019-07-10 DIAGNOSIS — D631 Anemia in chronic kidney disease: Secondary | ICD-10-CM | POA: Diagnosis not present

## 2019-07-10 DIAGNOSIS — F411 Generalized anxiety disorder: Secondary | ICD-10-CM | POA: Diagnosis not present

## 2019-07-10 DIAGNOSIS — E1122 Type 2 diabetes mellitus with diabetic chronic kidney disease: Secondary | ICD-10-CM | POA: Diagnosis not present

## 2019-07-10 DIAGNOSIS — I2510 Atherosclerotic heart disease of native coronary artery without angina pectoris: Secondary | ICD-10-CM | POA: Diagnosis not present

## 2019-07-10 DIAGNOSIS — E782 Mixed hyperlipidemia: Secondary | ICD-10-CM | POA: Diagnosis not present

## 2019-07-10 DIAGNOSIS — D696 Thrombocytopenia, unspecified: Secondary | ICD-10-CM | POA: Diagnosis not present

## 2019-07-10 DIAGNOSIS — I5020 Unspecified systolic (congestive) heart failure: Secondary | ICD-10-CM | POA: Diagnosis not present

## 2019-07-10 DIAGNOSIS — I10 Essential (primary) hypertension: Secondary | ICD-10-CM | POA: Diagnosis not present

## 2019-07-10 DIAGNOSIS — I129 Hypertensive chronic kidney disease with stage 1 through stage 4 chronic kidney disease, or unspecified chronic kidney disease: Secondary | ICD-10-CM | POA: Diagnosis not present

## 2019-07-10 DIAGNOSIS — H9193 Unspecified hearing loss, bilateral: Secondary | ICD-10-CM | POA: Diagnosis not present

## 2019-07-11 ENCOUNTER — Encounter: Payer: Self-pay | Admitting: Internal Medicine

## 2019-07-11 ENCOUNTER — Ambulatory Visit (INDEPENDENT_AMBULATORY_CARE_PROVIDER_SITE_OTHER): Payer: Medicare Other | Admitting: Cardiology

## 2019-07-11 ENCOUNTER — Encounter: Payer: Self-pay | Admitting: Cardiology

## 2019-07-11 VITALS — BP 180/75 | HR 74 | Temp 98.7°F | Ht 60.0 in | Wt 149.0 lb

## 2019-07-11 DIAGNOSIS — I5022 Chronic systolic (congestive) heart failure: Secondary | ICD-10-CM | POA: Diagnosis not present

## 2019-07-11 DIAGNOSIS — Z8679 Personal history of other diseases of the circulatory system: Secondary | ICD-10-CM

## 2019-07-11 DIAGNOSIS — I2510 Atherosclerotic heart disease of native coronary artery without angina pectoris: Secondary | ICD-10-CM

## 2019-07-11 DIAGNOSIS — I471 Supraventricular tachycardia: Secondary | ICD-10-CM | POA: Diagnosis not present

## 2019-07-11 NOTE — Progress Notes (Signed)
Clinical Summary Robin Guzman is a 85 y.o.female seen today for follow up of the following medical problems, last seen by Dr Robin Guzman, our office is closer and she is estbalishing here.   1. CAD -admit 07/2017 with NSTEMI - cath with mild nonobstructive disease, LVgram LVEF 25-30% with WMA suggesting stress induced CM and elevated LVEDP. Did not have echo - medical therapy limited by soft bp's   - no recent chest pains. Had some fatigue and SOB 1 month ago that has resolved.   2.PSVT - prior monitor showed atach - no recent palpitations.    3. HTN - compliant with meds  4. Chronic LBBB  5. SOB - seen in ER 05/2019 for SOB and presyncope. Left AMA - COVID neg.  Hs trop neg x 1, Ddimer 0.56 elevated, EKG SR chronic LBBB CXR no acute process  - no recurrent SOB. - no recent edema.     Past Medical History:  Diagnosis Date  . Arthritis   . CAD (coronary artery disease), native coronary artery    07/27/17 nonobstructive, EF 25-35% by LV gram  . CHF (congestive heart failure) ([place])   . Dementia ([place])   . Diabetes mellitus without complication ([place])   . Dyslipidemia   . Dyspnea   . Dyspnea on exertion   . History of stress test 07/05/2006   High risk scan cardiac cathe was recommended.  [name] Hx of echocardiogram 04/09/2011   EF 55% Mildly hypertrophic left ventricle with normal systolic function, Moderate (grade II) diastolic dysfunction. Elevated left atrial pressure. No significant valvular abnormalities. No pericardial effusion. Mild to moderate pulmonary arterial hypertension.  . Hypercholesteremia   . Hypertension   . Myocardial infarct ([place])   . Skin cancer    HISTORY OF      Allergies  Allergen Reactions  . Daypro [Oxaprozin] Shortness Of Breath and Other (See Comments)    weakness  . Advil [Ibuprofen] Other (See Comments)    weakness     Current Outpatient Medications  Medication Sig Dispense Refill  . aspirin EC 81 MG tablet Take 81 mg by mouth  daily.    [name] atorvastatin (LIPITOR) 10 MG tablet Take 10 mg by mouth daily.    . carvedilol (COREG) 25 MG tablet Take 1 tablet (25 mg total) by mouth 2 (two) times daily with a meal. 180 tablet 3  . cephALEXin (KEFLEX) 250 MG capsule Take 2 capsules (500 mg total) by mouth 2 (two) times daily. 28 capsule 0  . glipiZIDE (GLUCOTROL) 5 MG tablet Take 5 mg by mouth 2 (two) times daily before a meal.     . ondansetron (ZOFRAN ODT) 4 MG disintegrating tablet Take 1 tablet (4 mg total) by mouth every 8 (eight) hours as needed for nausea or vomiting. 20 tablet 0   No current facility-administered medications for this visit.      Past Surgical History:  Procedure Laterality Date  . ABDOMINAL HYSTERECTOMY    . CATARACT EXTRACTION W/PHACO Right 05/20/2017   Procedure: CATARACT EXTRACTION PHACO AND INTRAOCULAR LENS PLACEMENT RIGHT EYE;  Surgeon: Tonny , MD;  Location: [facility];  Service: Ophthalmology;  Laterality: Right;  CDE: 17.77  . CATARACT EXTRACTION W/PHACO Left 06/21/2017   Procedure: CATARACT EXTRACTION PHACO AND INTRAOCULAR LENS PLACEMENT (IOC);  Surgeon: Tonny , MD;  Location: [facility];  Service: Ophthalmology;  Laterality: Left;  CDE: 8.90  . LEFT HEART CATH AND CORONARY ANGIOGRAPHY N/A 07/27/2017   Procedure: LEFT HEART CATH AND CORONARY  ANGIOGRAPHY;  Surgeon: Wellington Hampshire, MD;  Location: [place] [facility];  Service: Cardiovascular;  Laterality: N/A;     Allergies  Allergen Reactions  . Daypro [Oxaprozin] Shortness Of Breath and Other (See Comments)    weakness  . Advil [Ibuprofen] Other (See Comments)    weakness      Family History  Problem Relation Age of Onset  . Cancer Mother   . Heart Problems Father   . Cancer Brother        brain  . Cancer Sister        brain     Social History Robin Guzman reports that she has never smoked. She has never used smokeless tobacco. Robin Guzman reports no history of alcohol use.   Review of Systems CONSTITUTIONAL: No  weight loss, fever, chills, weakness or fatigue.  HEENT: Eyes: No visual loss, blurred vision, double vision or yellow sclerae.No hearing loss, sneezing, congestion, runny nose or sore throat.  SKIN: No rash or itching.  CARDIOVASCULAR: per hpi RESPIRATORY: No shortness of breath, cough or sputum.  GASTROINTESTINAL: No anorexia, nausea, vomiting or diarrhea. No abdominal pain or blood.  GENITOURINARY: No burning on urination, no polyuria NEUROLOGICAL: No headache, dizziness, syncope, paralysis, ataxia, numbness or tingling in the extremities. No change in bowel or bladder control.  MUSCULOSKELETAL: No muscle, back pain, joint pain or stiffness.  LYMPHATICS: No enlarged nodes. No history of splenectomy.  PSYCHIATRIC: No history of depression or anxiety.  ENDOCRINOLOGIC: No reports of sweating, cold or heat intolerance. No polyuria or polydipsia.  [name]   Physical Examination Today's Vitals   07/11/19 1321  BP: (!) 180/75  Pulse: 74  Temp: 98.7 F (37.1 C)  SpO2: 98%  Weight: 149 lb (67.6 kg)  Height: 5' (1.524 m)   Body mass index is 29.1 kg/m.  Gen: resting comfortably, no acute distress HEENT: no scleral icterus, pupils equal round and reactive, no palptable cervical adenopathy,  CV: RRR, no m/r/g no jvd Resp: Clear to auscultation bilaterally GI: abdomen is soft, non-tender, non-distended, normal bowel sounds, no hepatosplenomegaly MSK: extremities are warm, no edema.  Skin: warm, no rash Neuro:  no focal deficits Psych: appropriate affect   Assessment and Plan  1. CAD - mild nonobstructive disease by last cath - no recent symptoms, continue current meds  2. History of cardiomyopathy/Systolic HF - probable stress induced CM, I do no see where she had a f/u echo to evaluate for resolution, will obtan echo  3. HTN - manual repeat 160/80, above goal - they will submit a bp log in 1 week - looks like lisinopril was stopped in 10/2018 to allow room to increase coreg for  palpitations, consider adding back pending home bp's Would have a lenient bp goal for her given advanced age, intermittent dizziness. I think SBPs in 150s would be acceptable  4. PSVT - no recent symptoms, continue current meds         Arnoldo Lenis, M.D.

## 2019-07-11 NOTE — Patient Instructions (Addendum)
Medication Instructions: Your physician recommends that you continue on your current medications as directed. Please refer to the Current Medication list given to you today.   Labwork: none  Procedures/Testing: Your physician has requested that you have an echocardiogram. Echocardiography is a painless test that uses sound waves to create images of your heart. It provides your doctor with information about the size and shape of your heart and how well your heart's chambers and valves are working. This procedure takes approximately one hour. There are no restrictions for this procedure.    Follow-Up: 6 months with Dr,Branch  Any Additional Special Instructions Will Be Listed Below (If Applicable).   Keep BP log for 1 week and call with numbers If you need a refill on your cardiac medications before your next appointment, please call your pharmacy.

## 2019-07-11 NOTE — Addendum Note (Signed)
Addended byDebbora Lacrosse R on: 07/11/2019 02:03 PM   Modules accepted: Orders

## 2019-07-12 DIAGNOSIS — H9193 Unspecified hearing loss, bilateral: Secondary | ICD-10-CM | POA: Diagnosis not present

## 2019-07-12 DIAGNOSIS — I10 Essential (primary) hypertension: Secondary | ICD-10-CM | POA: Diagnosis not present

## 2019-07-12 DIAGNOSIS — F411 Generalized anxiety disorder: Secondary | ICD-10-CM | POA: Diagnosis not present

## 2019-07-12 DIAGNOSIS — N183 Chronic kidney disease, stage 3 (moderate): Secondary | ICD-10-CM | POA: Diagnosis not present

## 2019-07-12 DIAGNOSIS — I5020 Unspecified systolic (congestive) heart failure: Secondary | ICD-10-CM | POA: Diagnosis not present

## 2019-07-12 DIAGNOSIS — E782 Mixed hyperlipidemia: Secondary | ICD-10-CM | POA: Diagnosis not present

## 2019-07-12 DIAGNOSIS — I129 Hypertensive chronic kidney disease with stage 1 through stage 4 chronic kidney disease, or unspecified chronic kidney disease: Secondary | ICD-10-CM | POA: Diagnosis not present

## 2019-07-12 DIAGNOSIS — D631 Anemia in chronic kidney disease: Secondary | ICD-10-CM | POA: Diagnosis not present

## 2019-07-12 DIAGNOSIS — I2510 Atherosclerotic heart disease of native coronary artery without angina pectoris: Secondary | ICD-10-CM | POA: Diagnosis not present

## 2019-07-12 DIAGNOSIS — E1122 Type 2 diabetes mellitus with diabetic chronic kidney disease: Secondary | ICD-10-CM | POA: Diagnosis not present

## 2019-07-12 DIAGNOSIS — D696 Thrombocytopenia, unspecified: Secondary | ICD-10-CM | POA: Diagnosis not present

## 2019-07-24 ENCOUNTER — Ambulatory Visit (HOSPITAL_COMMUNITY): Payer: Medicare Other | Attending: Cardiology

## 2019-07-26 ENCOUNTER — Emergency Department (HOSPITAL_COMMUNITY)
Admission: EM | Admit: 2019-07-26 | Discharge: 2019-07-26 | Disposition: A | Discharge status: Home / Self Care | Payer: Medicare Other | Attending: Emergency Medicine | Admitting: Emergency Medicine

## 2019-07-26 ENCOUNTER — Encounter (HOSPITAL_COMMUNITY): Payer: Self-pay

## 2019-07-26 ENCOUNTER — Emergency Department (HOSPITAL_COMMUNITY): Payer: Medicare Other

## 2019-07-26 ENCOUNTER — Other Ambulatory Visit: Payer: Self-pay

## 2019-07-26 DIAGNOSIS — I447 Left bundle-branch block, unspecified: Secondary | ICD-10-CM | POA: Diagnosis not present

## 2019-07-26 DIAGNOSIS — Z85828 Personal history of other malignant neoplasm of skin: Secondary | ICD-10-CM | POA: Insufficient documentation

## 2019-07-26 DIAGNOSIS — R0902 Hypoxemia: Secondary | ICD-10-CM | POA: Diagnosis not present

## 2019-07-26 DIAGNOSIS — Z7984 Long term (current) use of oral hypoglycemic drugs: Secondary | ICD-10-CM | POA: Diagnosis not present

## 2019-07-26 DIAGNOSIS — S20211A Contusion of right front wall of thorax, initial encounter: Secondary | ICD-10-CM | POA: Diagnosis not present

## 2019-07-26 DIAGNOSIS — F0390 Unspecified dementia without behavioral disturbance: Secondary | ICD-10-CM | POA: Insufficient documentation

## 2019-07-26 DIAGNOSIS — Y9389 Activity, other specified: Secondary | ICD-10-CM | POA: Diagnosis not present

## 2019-07-26 DIAGNOSIS — R0602 Shortness of breath: Secondary | ICD-10-CM | POA: Diagnosis not present

## 2019-07-26 DIAGNOSIS — Z7982 Long term (current) use of aspirin: Secondary | ICD-10-CM | POA: Insufficient documentation

## 2019-07-26 DIAGNOSIS — W010XXA Fall on same level from slipping, tripping and stumbling without subsequent striking against object, initial encounter: Secondary | ICD-10-CM | POA: Diagnosis not present

## 2019-07-26 DIAGNOSIS — S299XXA Unspecified injury of thorax, initial encounter: Secondary | ICD-10-CM | POA: Diagnosis present

## 2019-07-26 DIAGNOSIS — E119 Type 2 diabetes mellitus without complications: Secondary | ICD-10-CM | POA: Insufficient documentation

## 2019-07-26 DIAGNOSIS — Y92019 Unspecified place in single-family (private) house as the place of occurrence of the external cause: Secondary | ICD-10-CM | POA: Diagnosis not present

## 2019-07-26 DIAGNOSIS — I5021 Acute systolic (congestive) heart failure: Secondary | ICD-10-CM | POA: Insufficient documentation

## 2019-07-26 DIAGNOSIS — R140 Abdominal distension (gaseous): Secondary | ICD-10-CM | POA: Insufficient documentation

## 2019-07-26 DIAGNOSIS — I5032 Chronic diastolic (congestive) heart failure: Secondary | ICD-10-CM | POA: Insufficient documentation

## 2019-07-26 DIAGNOSIS — R0781 Pleurodynia: Secondary | ICD-10-CM | POA: Diagnosis not present

## 2019-07-26 DIAGNOSIS — R069 Unspecified abnormalities of breathing: Secondary | ICD-10-CM | POA: Diagnosis not present

## 2019-07-26 DIAGNOSIS — I259 Chronic ischemic heart disease, unspecified: Secondary | ICD-10-CM | POA: Diagnosis not present

## 2019-07-26 DIAGNOSIS — Y999 Unspecified external cause status: Secondary | ICD-10-CM | POA: Diagnosis not present

## 2019-07-26 DIAGNOSIS — I110 Hypertensive heart disease with heart failure: Secondary | ICD-10-CM | POA: Diagnosis not present

## 2019-07-26 DIAGNOSIS — R52 Pain, unspecified: Secondary | ICD-10-CM | POA: Diagnosis not present

## 2019-07-26 DIAGNOSIS — R531 Weakness: Secondary | ICD-10-CM | POA: Diagnosis not present

## 2019-07-26 LAB — URINALYSIS, ROUTINE W REFLEX MICROSCOPIC
Bilirubin Urine: NEGATIVE
Glucose, UA: NEGATIVE mg/dL
Hgb urine dipstick: NEGATIVE
Ketones, ur: NEGATIVE mg/dL
Leukocytes,Ua: NEGATIVE
Nitrite: NEGATIVE
Protein, ur: NEGATIVE mg/dL
Specific Gravity, Urine: 1.013 (ref 1.005–1.030)
pH: 5 (ref 5.0–8.0)

## 2019-07-26 LAB — BASIC METABOLIC PANEL
Anion gap: 11 (ref 5–15)
BUN: 21 mg/dL (ref 8–23)
CO2: 23 mmol/L (ref 22–32)
Calcium: 9.5 mg/dL (ref 8.9–10.3)
Chloride: 105 mmol/L (ref 98–111)
Creatinine, Ser: 1.17 mg/dL — ABNORMAL HIGH (ref 0.44–1.00)
GFR calc Af Amer: 48 mL/min — ABNORMAL LOW (ref >60)
GFR calc non Af Amer: 41 mL/min — ABNORMAL LOW (ref >60)
Glucose, Bld: 103 mg/dL — ABNORMAL HIGH (ref 70–99)
Potassium: 4.8 mmol/L (ref 3.5–5.1)
Sodium: 139 mmol/L (ref 135–145)

## 2019-07-26 LAB — CBC
HCT: 36.4 % (ref 36.0–46.0)
Hemoglobin: 11.2 g/dL — ABNORMAL LOW (ref 12.0–15.0)
MCH: 31.2 pg (ref 26.0–34.0)
MCHC: 30.8 g/dL (ref 30.0–36.0)
MCV: 101.4 fL — ABNORMAL HIGH (ref 80.0–100.0)
Platelets: 117 10*3/uL — ABNORMAL LOW (ref 150–400)
RBC: 3.59 MIL/uL — ABNORMAL LOW (ref 3.87–5.11)
RDW: 15.1 % (ref 11.5–15.5)
WBC: 5.9 10*3/uL (ref 4.0–10.5)
nRBC: 0 % (ref 0.0–0.2)

## 2019-07-26 NOTE — Discharge Instructions (Signed)
Your testing is normal today other than low platelets - this has been seen on prior blood work but should be followed up with your doctor in the next 2 weeks Xray normal - normal lungs and ribs ER for worsening symptoms.

## 2019-07-26 NOTE — ED Triage Notes (Signed)
EMS reports was called out for sob and anxiety.  Reports grand daughter was at home with pt and called ems.  Pt told ems she "just feels sick" and c/o abd pain.  EMS reports pt has some tenderness in rlq  Reports fell recently and hit abd.   Pt has history of dementia and ems says pt has history of leaving AMA.  CBG 138.   co2 34.  Recently treated for uti.

## 2019-07-26 NOTE — ED Provider Notes (Signed)
[facility] EMERGENCY DEPARTMENT Provider Note   CSN: [id number] Arrival date & time: 07/26/19  1009     History   Chief Complaint Chief Complaint  Patient presents with  . Shortness of Breath    HPI Robin Guzman is a 85 y.o. female.     HPI  This patient is a very pleasant 85 year old female who lives by herself.  She reportedly this morning saw that she had some bruising on her abdomen, felt like her abdomen was tight and distended and then had some brief shortness of breath.  The paramedics were called because of this and when they arrived they found the patient to be in no distress with very little complaint.  The daughter who lives a couple of doors down is here at the bedside as well and reports that a couple of days ago after having a argument with family members she stumbled and as she went to fall the family members caught her with her arms around her ribs from the back and probably because the bruising from that.  The patient denies having any pain over those bruising areas on the right.  Paramedics report that the patient's oxygen was normal, vital signs were unremarkable as was her cardiac tracing.  The patient has recently had a urine infection and was treated for it.  She denies constipation diarrhea chest pain headache neck pain numbness weakness or any other complaints.  She has had a normal appetite recently.  Past Medical History:  Diagnosis Date  . Arthritis   . CAD (coronary artery disease), native coronary artery    07/27/17 nonobstructive, EF 25-35% by LV gram  . CHF (congestive heart failure) ([place])   . Dementia ([place])   . Diabetes mellitus without complication ([place])   . Dyslipidemia   . Dyspnea   . Dyspnea on exertion   . History of stress test 07/05/2006   High risk scan cardiac cathe was recommended.  [name] Hx of echocardiogram 04/09/2011   EF 55% Mildly hypertrophic left ventricle with normal systolic function, Moderate (grade II) diastolic dysfunction.  Elevated left atrial pressure. No significant valvular abnormalities. No pericardial effusion. Mild to moderate pulmonary arterial hypertension.  . Hypercholesteremia   . Hypertension   . Myocardial infarct ([place])   . Skin cancer    HISTORY OF     Patient Active Problem List   Diagnosis Date Noted  . SVT (supraventricular tachycardia) ([place]) 10/23/2018  . PAT (paroxysmal atrial tachycardia) ([place]) 04/23/2018  . LBBB (left bundle branch block) 04/23/2018  . Dyslipidemia 04/23/2018  . Atherosclerosis of aorta ([place]) 04/23/2018  . Acute systolic heart failure ([place]) 07/28/2017  . Sundowning 07/28/2017  . NSTEMI (non-ST elevated myocardial infarction) ([place]) 07/26/2017  . Closed 3-part fracture of proximal humerus, left, with routine healing, subsequent encounter 12/02/2016  . UTI (urinary tract infection) 12/25/2013  . Dyspnea 12/25/2013  . Near syncope 12/25/2013  . CAD (coronary artery disease) 12/25/2013  . Chronic diastolic heart failure ([place]) 08/07/2013  . Essential hypertension 08/07/2013  . Controlled type 2 diabetes mellitus without complication, without long-term current use of insulin ([place]) 08/07/2013  . Hypercholesteremia 08/07/2013    Past Surgical History:  Procedure Laterality Date  . ABDOMINAL HYSTERECTOMY    . CATARACT EXTRACTION W/PHACO Right 05/20/2017   Procedure: CATARACT EXTRACTION PHACO AND INTRAOCULAR LENS PLACEMENT RIGHT EYE;  Surgeon: Tonny Branch, MD;  Location: [facility];  Service: Ophthalmology;  Laterality: Right;  CDE: 17.77  . CATARACT EXTRACTION W/PHACO Left 06/21/2017  Procedure: CATARACT EXTRACTION PHACO AND INTRAOCULAR LENS PLACEMENT (IOC);  Surgeon: Tonny Branch, MD;  Location: [facility];  Service: Ophthalmology;  Laterality: Left;  CDE: 8.90  . LEFT HEART CATH AND CORONARY ANGIOGRAPHY N/A 07/27/2017   Procedure: LEFT HEART CATH AND CORONARY ANGIOGRAPHY;  Surgeon: Wellington Hampshire, MD;  Location: [facility];  Service: Cardiovascular;  Laterality: N/A;      OB History    Gravida  3   Para  3   Term  3   Preterm      AB      Living        SAB      TAB      Ectopic      Multiple      Live Births               Home Medications    Prior to Admission medications   Medication Sig Start Date End Date Taking? Authorizing Provider  aspirin EC 81 MG tablet Take 81 mg by mouth daily.   Yes [provider]  atorvastatin (LIPITOR) 10 MG tablet Take 10 mg by mouth daily.   Yes [provider]  carvedilol (COREG) 25 MG tablet Take 1 tablet (25 mg total) by mouth 2 (two) times daily with a meal. 10/23/18  Yes Croitoru, Mihai, MD  glipiZIDE (GLUCOTROL) 5 MG tablet Take 5 mg by mouth 2 (two) times daily before a meal.    Yes [provider]  metFORMIN (GLUCOPHAGE) 500 MG tablet Take 500 mg by mouth 2 (two) times daily with a meal.   Yes [provider]  cephALEXin (KEFLEX) 250 MG capsule Take 2 capsules (500 mg total) by mouth 2 (two) times daily. Patient not taking: Reported on 07/26/2019 05/21/19   Darlin Drop P, PA-C  ondansetron (ZOFRAN ODT) 4 MG disintegrating tablet Take 1 tablet (4 mg total) by mouth every 8 (eight) hours as needed for nausea or vomiting. Patient not taking: Reported on 07/26/2019 05/21/19   Arville Lime, PA-C    Family History Family History  Problem Relation Age of Onset  . Cancer Mother   . Heart Problems Father   . Cancer Brother        brain  . Cancer Sister        brain    Social History Social History   Tobacco Use  . Smoking status: Never Smoker  . Smokeless tobacco: Never Used  Substance Use Topics  . Alcohol use: No  . Drug use: No     Allergies   Daypro [oxaprozin] and Advil [ibuprofen]   Review of Systems Review of Systems  All other systems reviewed and are negative.    Physical Exam Updated Vital Signs BP (!) 155/99 (BP Location: Right Arm)   Pulse 73   Temp 98.8 F (37.1 C) (Oral)   Resp 20   SpO2 98%   Physical Exam Vitals  signs and nursing note reviewed.  Constitutional:      General: She is not in acute distress.    Appearance: She is well-developed.  HENT:     Head: Normocephalic and atraumatic.     Mouth/Throat:     Mouth: Mucous membranes are moist.     Pharynx: No oropharyngeal exudate.  Eyes:     General: No scleral icterus.       Right eye: No discharge.        Left eye: No discharge.     Conjunctiva/sclera: Conjunctivae  normal.     Pupils: Pupils are equal, round, and reactive to light.  Neck:     Musculoskeletal: Normal range of motion and neck supple.     Thyroid: No thyromegaly.     Vascular: No JVD.  Cardiovascular:     Rate and Rhythm: Normal rate and regular rhythm.     Heart sounds: Normal heart sounds. No murmur. No friction rub. No gallop.   Pulmonary:     Effort: Pulmonary effort is normal. No respiratory distress.     Breath sounds: Normal breath sounds. No wheezing or rales.  Chest:     Chest wall: Tenderness ( mild ttp over teh R lower anterior chest with bruising) present. No mass, deformity, crepitus or edema.  Abdominal:     General: Bowel sounds are normal. There is no distension.     Palpations: Abdomen is soft. There is no mass.     Tenderness: There is no abdominal tenderness.     Comments: Abdomen obese - milb bruising - purple - no ttp in these areas - no tympanitic sounds to percussion - normal BS, no ttp to deep palaption - no palpable pulsating mass  Musculoskeletal: Normal range of motion.        General: No tenderness.     Right lower leg: She exhibits no tenderness. No edema.     Left lower leg: She exhibits no tenderness. No edema.     Comments: 4 extremities without deformity or edema - moves with supple coordination  Lymphadenopathy:     Cervical: No cervical adenopathy.  Skin:    General: Skin is warm and dry.     Findings: No erythema or rash.  Neurological:     General: No focal deficit present.     Mental Status: She is alert. Mental status is at  baseline.     Coordination: Coordination normal.     Comments: Normal strength in all 4 extremities  Psychiatric:        Behavior: Behavior normal.      ED Treatments / Results  Labs (all labs ordered are listed, but only abnormal results are displayed) Labs Reviewed  CBC - Abnormal; Notable for the following components:      Result Value   RBC 3.59 (*)    Hemoglobin 11.2 (*)    MCV 101.4 (*)    Platelets 117 (*)    All other components within normal limits  BASIC METABOLIC PANEL - Abnormal; Notable for the following components:   Glucose, Bld 103 (*)    Creatinine, Ser 1.17 (*)    GFR calc non Af Amer 41 (*)    GFR calc Af Amer 48 (*)    All other components within normal limits  URINE CULTURE  URINALYSIS, ROUTINE W REFLEX MICROSCOPIC    EKG EKG Interpretation  Date/Time:  Thursday July 26 2019 10:41:40 EDT Ventricular Rate:  68 PR Interval:    QRS Duration: 134 QT Interval:  428 QTC Calculation: 456 R Axis:   -37 Text Interpretation:  Sinus rhythm Left bundle branch block since last tracing no significant change Confirmed by Noemi Chapel [phone]) on 07/26/2019 10:48:28 AM   Radiology Dg Ribs Unilateral W/chest Right  Result Date: 07/26/2019 CLINICAL DATA:  Right rib pain, shortness of breath EXAM: RIGHT RIBS AND CHEST - 3+ VIEW COMPARISON:  05/27/2019 FINDINGS: Motion artifact slightly degrades image quality. No fracture or other bone lesions are seen involving the ribs. There is no evidence of pneumothorax or pleural effusion.  Both lungs are clear. Heart size and mediastinal contours are stable. Chronic degenerative changes of the left shoulder. IMPRESSION: No displaced right-sided rib fracture identified. Electronically Signed   By: Davina Poke M.D.   On: 07/26/2019 11:39    Procedures Procedures (including critical care time)  Medications Ordered in ED Medications - No data to display   Initial Impression / Assessment and Plan / ED Course  I have  reviewed the triage vital signs and the nursing notes.  Pertinent labs & imaging results that were available during my care of the patient were reviewed by me and considered in my medical decision making (see chart for details).  Clinical Course as of Jul 25 1236  Thu Jul 26, 2019  1204 Lab work reviewed, the patient does have thrombocytopenia but this has been seen on multiple prior lab exams.  Metabolic panel is unremarkable   [BM]  1205 I have personally viewed the x-ray of the chest and the ribs, there is no signs of acute fractures, there is no signs of pneumothorax or underlying lung disease.  Laboratory work-up also unremarkable.   [BM]  1206 I agree with the radiologist interpretation of the x-rays   [BM]  1233 UA negative, stable for d/c.   [BM]    Clinical Course User Index [BM] Noemi Chapel, MD       Family states pt is now at baseline abd pain - bruising - r/o fracture - likely some anxiety component Not SOB at this time - normal VS, no hypoxia, normal lung sounds.  Final Clinical Impressions(s) / ED Diagnoses   Final diagnoses:  Contusion of right chest wall, initial encounter  Abdominal distension    ED Discharge Orders    None       Noemi Chapel, MD 07/26/19 1237

## 2019-07-28 LAB — URINE CULTURE: Culture: >=100000 — AB

## 2019-07-29 ENCOUNTER — Telehealth: Payer: Self-pay

## 2019-07-29 NOTE — Telephone Encounter (Signed)
No treatment for UC ED 07/26/2019  Symptom check  F/U with PCP  Per Nuala Alpha PA

## 2019-08-01 DIAGNOSIS — F411 Generalized anxiety disorder: Secondary | ICD-10-CM | POA: Diagnosis not present

## 2019-08-01 DIAGNOSIS — I5020 Unspecified systolic (congestive) heart failure: Secondary | ICD-10-CM | POA: Diagnosis not present

## 2019-08-01 DIAGNOSIS — I129 Hypertensive chronic kidney disease with stage 1 through stage 4 chronic kidney disease, or unspecified chronic kidney disease: Secondary | ICD-10-CM | POA: Diagnosis not present

## 2019-08-01 DIAGNOSIS — H9193 Unspecified hearing loss, bilateral: Secondary | ICD-10-CM | POA: Diagnosis not present

## 2019-08-01 DIAGNOSIS — E1122 Type 2 diabetes mellitus with diabetic chronic kidney disease: Secondary | ICD-10-CM | POA: Diagnosis not present

## 2019-08-01 DIAGNOSIS — E782 Mixed hyperlipidemia: Secondary | ICD-10-CM | POA: Diagnosis not present

## 2019-08-01 DIAGNOSIS — D631 Anemia in chronic kidney disease: Secondary | ICD-10-CM | POA: Diagnosis not present

## 2019-08-01 DIAGNOSIS — I10 Essential (primary) hypertension: Secondary | ICD-10-CM | POA: Diagnosis not present

## 2019-08-01 DIAGNOSIS — I2510 Atherosclerotic heart disease of native coronary artery without angina pectoris: Secondary | ICD-10-CM | POA: Diagnosis not present

## 2019-08-01 DIAGNOSIS — N183 Chronic kidney disease, stage 3 (moderate): Secondary | ICD-10-CM | POA: Diagnosis not present

## 2019-08-01 DIAGNOSIS — D696 Thrombocytopenia, unspecified: Secondary | ICD-10-CM | POA: Diagnosis not present

## 2019-08-07 DIAGNOSIS — Z23 Encounter for immunization: Secondary | ICD-10-CM | POA: Diagnosis not present

## 2019-08-07 DIAGNOSIS — E1122 Type 2 diabetes mellitus with diabetic chronic kidney disease: Secondary | ICD-10-CM | POA: Diagnosis not present

## 2019-09-11 DIAGNOSIS — E782 Mixed hyperlipidemia: Secondary | ICD-10-CM | POA: Diagnosis not present

## 2019-09-11 DIAGNOSIS — D696 Thrombocytopenia, unspecified: Secondary | ICD-10-CM | POA: Diagnosis not present

## 2019-09-11 DIAGNOSIS — I5020 Unspecified systolic (congestive) heart failure: Secondary | ICD-10-CM | POA: Diagnosis not present

## 2019-09-11 DIAGNOSIS — D631 Anemia in chronic kidney disease: Secondary | ICD-10-CM | POA: Diagnosis not present

## 2019-09-11 DIAGNOSIS — I129 Hypertensive chronic kidney disease with stage 1 through stage 4 chronic kidney disease, or unspecified chronic kidney disease: Secondary | ICD-10-CM | POA: Diagnosis not present

## 2019-09-11 DIAGNOSIS — F411 Generalized anxiety disorder: Secondary | ICD-10-CM | POA: Diagnosis not present

## 2019-09-11 DIAGNOSIS — H9193 Unspecified hearing loss, bilateral: Secondary | ICD-10-CM | POA: Diagnosis not present

## 2019-09-11 DIAGNOSIS — E1122 Type 2 diabetes mellitus with diabetic chronic kidney disease: Secondary | ICD-10-CM | POA: Diagnosis not present

## 2019-09-11 DIAGNOSIS — I10 Essential (primary) hypertension: Secondary | ICD-10-CM | POA: Diagnosis not present

## 2019-09-11 DIAGNOSIS — I2510 Atherosclerotic heart disease of native coronary artery without angina pectoris: Secondary | ICD-10-CM | POA: Diagnosis not present

## 2019-10-02 DIAGNOSIS — I129 Hypertensive chronic kidney disease with stage 1 through stage 4 chronic kidney disease, or unspecified chronic kidney disease: Secondary | ICD-10-CM | POA: Diagnosis not present

## 2019-10-02 DIAGNOSIS — D631 Anemia in chronic kidney disease: Secondary | ICD-10-CM | POA: Diagnosis not present

## 2019-10-02 DIAGNOSIS — E1122 Type 2 diabetes mellitus with diabetic chronic kidney disease: Secondary | ICD-10-CM | POA: Diagnosis not present

## 2019-10-02 DIAGNOSIS — I5020 Unspecified systolic (congestive) heart failure: Secondary | ICD-10-CM | POA: Diagnosis not present

## 2019-10-02 DIAGNOSIS — F411 Generalized anxiety disorder: Secondary | ICD-10-CM | POA: Diagnosis not present

## 2019-10-02 DIAGNOSIS — E782 Mixed hyperlipidemia: Secondary | ICD-10-CM | POA: Diagnosis not present

## 2019-10-02 DIAGNOSIS — D696 Thrombocytopenia, unspecified: Secondary | ICD-10-CM | POA: Diagnosis not present

## 2019-10-02 DIAGNOSIS — I10 Essential (primary) hypertension: Secondary | ICD-10-CM | POA: Diagnosis not present

## 2019-10-02 DIAGNOSIS — H9193 Unspecified hearing loss, bilateral: Secondary | ICD-10-CM | POA: Diagnosis not present

## 2019-10-02 DIAGNOSIS — I2510 Atherosclerotic heart disease of native coronary artery without angina pectoris: Secondary | ICD-10-CM | POA: Diagnosis not present

## 2019-10-17 ENCOUNTER — Other Ambulatory Visit: Payer: Self-pay

## 2019-10-23 DIAGNOSIS — E1122 Type 2 diabetes mellitus with diabetic chronic kidney disease: Secondary | ICD-10-CM | POA: Diagnosis not present

## 2019-10-23 DIAGNOSIS — I10 Essential (primary) hypertension: Secondary | ICD-10-CM | POA: Diagnosis not present

## 2019-10-23 DIAGNOSIS — E1165 Type 2 diabetes mellitus with hyperglycemia: Secondary | ICD-10-CM | POA: Diagnosis not present

## 2019-10-23 DIAGNOSIS — D649 Anemia, unspecified: Secondary | ICD-10-CM | POA: Diagnosis not present

## 2019-10-23 DIAGNOSIS — D509 Iron deficiency anemia, unspecified: Secondary | ICD-10-CM | POA: Diagnosis not present

## 2019-10-23 DIAGNOSIS — N183 Chronic kidney disease, stage 3 unspecified: Secondary | ICD-10-CM | POA: Diagnosis not present

## 2019-10-27 DIAGNOSIS — L989 Disorder of the skin and subcutaneous tissue, unspecified: Secondary | ICD-10-CM | POA: Diagnosis not present

## 2019-10-27 DIAGNOSIS — I10 Essential (primary) hypertension: Secondary | ICD-10-CM | POA: Diagnosis not present

## 2019-10-27 DIAGNOSIS — I129 Hypertensive chronic kidney disease with stage 1 through stage 4 chronic kidney disease, or unspecified chronic kidney disease: Secondary | ICD-10-CM | POA: Diagnosis not present

## 2019-10-27 DIAGNOSIS — I5020 Unspecified systolic (congestive) heart failure: Secondary | ICD-10-CM | POA: Diagnosis not present

## 2019-10-27 DIAGNOSIS — D696 Thrombocytopenia, unspecified: Secondary | ICD-10-CM | POA: Diagnosis not present

## 2019-10-27 DIAGNOSIS — H9193 Unspecified hearing loss, bilateral: Secondary | ICD-10-CM | POA: Diagnosis not present

## 2019-10-27 DIAGNOSIS — M25511 Pain in right shoulder: Secondary | ICD-10-CM | POA: Diagnosis not present

## 2019-10-27 DIAGNOSIS — E782 Mixed hyperlipidemia: Secondary | ICD-10-CM | POA: Diagnosis not present

## 2019-10-27 DIAGNOSIS — E1122 Type 2 diabetes mellitus with diabetic chronic kidney disease: Secondary | ICD-10-CM | POA: Diagnosis not present

## 2019-10-27 DIAGNOSIS — D631 Anemia in chronic kidney disease: Secondary | ICD-10-CM | POA: Diagnosis not present

## 2019-10-27 DIAGNOSIS — I2510 Atherosclerotic heart disease of native coronary artery without angina pectoris: Secondary | ICD-10-CM | POA: Diagnosis not present

## 2019-10-27 DIAGNOSIS — F411 Generalized anxiety disorder: Secondary | ICD-10-CM | POA: Diagnosis not present

## 2019-12-10 DIAGNOSIS — N189 Chronic kidney disease, unspecified: Secondary | ICD-10-CM | POA: Diagnosis not present

## 2019-12-10 DIAGNOSIS — I130 Hypertensive heart and chronic kidney disease with heart failure and stage 1 through stage 4 chronic kidney disease, or unspecified chronic kidney disease: Secondary | ICD-10-CM | POA: Diagnosis not present

## 2019-12-10 DIAGNOSIS — I5020 Unspecified systolic (congestive) heart failure: Secondary | ICD-10-CM | POA: Diagnosis not present

## 2019-12-10 DIAGNOSIS — E782 Mixed hyperlipidemia: Secondary | ICD-10-CM | POA: Diagnosis not present

## 2019-12-10 DIAGNOSIS — E1122 Type 2 diabetes mellitus with diabetic chronic kidney disease: Secondary | ICD-10-CM | POA: Diagnosis not present

## 2019-12-18 DIAGNOSIS — D631 Anemia in chronic kidney disease: Secondary | ICD-10-CM | POA: Diagnosis not present

## 2019-12-18 DIAGNOSIS — E1122 Type 2 diabetes mellitus with diabetic chronic kidney disease: Secondary | ICD-10-CM | POA: Diagnosis not present

## 2019-12-18 DIAGNOSIS — L821 Other seborrheic keratosis: Secondary | ICD-10-CM | POA: Diagnosis not present

## 2019-12-18 DIAGNOSIS — D696 Thrombocytopenia, unspecified: Secondary | ICD-10-CM | POA: Diagnosis not present

## 2019-12-31 DIAGNOSIS — D0439 Carcinoma in situ of skin of other parts of face: Secondary | ICD-10-CM | POA: Diagnosis not present

## 2019-12-31 DIAGNOSIS — X32XXXA Exposure to sunlight, initial encounter: Secondary | ICD-10-CM | POA: Diagnosis not present

## 2019-12-31 DIAGNOSIS — L570 Actinic keratosis: Secondary | ICD-10-CM | POA: Diagnosis not present

## 2020-01-03 DIAGNOSIS — Z7984 Long term (current) use of oral hypoglycemic drugs: Secondary | ICD-10-CM | POA: Diagnosis not present

## 2020-01-03 DIAGNOSIS — Z961 Presence of intraocular lens: Secondary | ICD-10-CM | POA: Diagnosis not present

## 2020-01-03 DIAGNOSIS — H26493 Other secondary cataract, bilateral: Secondary | ICD-10-CM | POA: Diagnosis not present

## 2020-01-03 DIAGNOSIS — H353131 Nonexudative age-related macular degeneration, bilateral, early dry stage: Secondary | ICD-10-CM | POA: Diagnosis not present

## 2020-01-03 DIAGNOSIS — E119 Type 2 diabetes mellitus without complications: Secondary | ICD-10-CM | POA: Diagnosis not present

## 2020-01-09 DIAGNOSIS — E782 Mixed hyperlipidemia: Secondary | ICD-10-CM | POA: Diagnosis not present

## 2020-01-09 DIAGNOSIS — E1122 Type 2 diabetes mellitus with diabetic chronic kidney disease: Secondary | ICD-10-CM | POA: Diagnosis not present

## 2020-01-09 DIAGNOSIS — N189 Chronic kidney disease, unspecified: Secondary | ICD-10-CM | POA: Diagnosis not present

## 2020-01-09 DIAGNOSIS — I130 Hypertensive heart and chronic kidney disease with heart failure and stage 1 through stage 4 chronic kidney disease, or unspecified chronic kidney disease: Secondary | ICD-10-CM | POA: Diagnosis not present

## 2020-01-09 DIAGNOSIS — I5020 Unspecified systolic (congestive) heart failure: Secondary | ICD-10-CM | POA: Diagnosis not present

## 2020-01-31 DIAGNOSIS — L0100 Impetigo, unspecified: Secondary | ICD-10-CM | POA: Diagnosis not present

## 2020-01-31 DIAGNOSIS — Z08 Encounter for follow-up examination after completed treatment for malignant neoplasm: Secondary | ICD-10-CM | POA: Diagnosis not present

## 2020-01-31 DIAGNOSIS — Z85828 Personal history of other malignant neoplasm of skin: Secondary | ICD-10-CM | POA: Diagnosis not present

## 2020-02-08 DIAGNOSIS — I130 Hypertensive heart and chronic kidney disease with heart failure and stage 1 through stage 4 chronic kidney disease, or unspecified chronic kidney disease: Secondary | ICD-10-CM | POA: Diagnosis not present

## 2020-02-08 DIAGNOSIS — N189 Chronic kidney disease, unspecified: Secondary | ICD-10-CM | POA: Diagnosis not present

## 2020-02-08 DIAGNOSIS — I5020 Unspecified systolic (congestive) heart failure: Secondary | ICD-10-CM | POA: Diagnosis not present

## 2020-02-08 DIAGNOSIS — E1122 Type 2 diabetes mellitus with diabetic chronic kidney disease: Secondary | ICD-10-CM | POA: Diagnosis not present

## 2020-02-08 DIAGNOSIS — E782 Mixed hyperlipidemia: Secondary | ICD-10-CM | POA: Diagnosis not present

## 2020-03-11 DIAGNOSIS — I5020 Unspecified systolic (congestive) heart failure: Secondary | ICD-10-CM | POA: Diagnosis not present

## 2020-03-11 DIAGNOSIS — N189 Chronic kidney disease, unspecified: Secondary | ICD-10-CM | POA: Diagnosis not present

## 2020-03-11 DIAGNOSIS — I130 Hypertensive heart and chronic kidney disease with heart failure and stage 1 through stage 4 chronic kidney disease, or unspecified chronic kidney disease: Secondary | ICD-10-CM | POA: Diagnosis not present

## 2020-03-11 DIAGNOSIS — E782 Mixed hyperlipidemia: Secondary | ICD-10-CM | POA: Diagnosis not present

## 2020-03-11 DIAGNOSIS — E1122 Type 2 diabetes mellitus with diabetic chronic kidney disease: Secondary | ICD-10-CM | POA: Diagnosis not present

## 2020-03-12 NOTE — Progress Notes (Signed)
Cardiology Office Note    Date:  03/17/2020   ID:  ALYN DENONCOURT, DOB 04/18/1928, MRN [id number]  PCP:  Celene Squibb, MD  Cardiologist: Sanda Klein, MD EPS: None  Chief Complaint  Patient presents with  . Follow-up    History of Present Illness:  Robin Guzman is a 86 y.o. female with history of CAD status post NSTEMI 07/2017 cath with mild nonobstructive disease, LVEF 25 to 30% with wall motion abnormalities suggesting stress-induced cardiomyopathy and elevated LVEDP.  Medical therapy limited by soft blood pressures, PSVT, hypertension.  Saw Dr. Harl Bowie 07/11/2019 blood pressure was above goal and he considered restarting lisinopril but wanted patient to check blood pressures at home.  Echo was also ordered because it was never done post MI but patient still has not had it done.  Patient comes in today accompanied by her daughter. She denies chest pain. She  Has occasional dyspnea if she lifts something she shouldn't be lifting or walking up an incline. Daughter says she had an echo 2 yrs ago at [facility]   Past Medical History:  Diagnosis Date  . Arthritis   . CAD (coronary artery disease), native coronary artery    07/27/17 nonobstructive, EF 25-35% by LV gram  . CHF (congestive heart failure) ([place])   . Dementia ([place])   . Diabetes mellitus without complication ([place])   . Dyslipidemia   . Dyspnea   . Dyspnea on exertion   . History of stress test 07/05/2006   High risk scan cardiac cathe was recommended.  [name] Hx of echocardiogram 04/09/2011   EF 55% Mildly hypertrophic left ventricle with normal systolic function, Moderate (grade II) diastolic dysfunction. Elevated left atrial pressure. No significant valvular abnormalities. No pericardial effusion. Mild to moderate pulmonary arterial hypertension.  . Hypercholesteremia   . Hypertension   . Myocardial infarct ([place])   . Skin cancer    HISTORY OF     Past Surgical History:  Procedure Laterality Date  . ABDOMINAL  HYSTERECTOMY    . CATARACT EXTRACTION W/PHACO Right 05/20/2017   Procedure: CATARACT EXTRACTION PHACO AND INTRAOCULAR LENS PLACEMENT RIGHT EYE;  Surgeon: Tonny Branch, MD;  Location: [facility];  Service: Ophthalmology;  Laterality: Right;  CDE: 17.77  . CATARACT EXTRACTION W/PHACO Left 06/21/2017   Procedure: CATARACT EXTRACTION PHACO AND INTRAOCULAR LENS PLACEMENT (IOC);  Surgeon: Tonny Branch, MD;  Location: [facility];  Service: Ophthalmology;  Laterality: Left;  CDE: 8.90  . LEFT HEART CATH AND CORONARY ANGIOGRAPHY N/A 07/27/2017   Procedure: LEFT HEART CATH AND CORONARY ANGIOGRAPHY;  Surgeon: Wellington Hampshire, MD;  Location: [facility] LAB;  Service: Cardiovascular;  Laterality: N/A;    Current Medications: Current Meds  Medication Sig  . aspirin EC 81 MG tablet Take 81 mg by mouth daily.  [name] atorvastatin (LIPITOR) 10 MG tablet Take 10 mg by mouth daily.  . carvedilol (COREG) 25 MG tablet Take 1 tablet (25 mg total) by mouth 2 (two) times daily with a meal.  . glipiZIDE (GLUCOTROL) 5 MG tablet Take 5 mg by mouth 2 (two) times daily before a meal.   . metFORMIN (GLUCOPHAGE) 500 MG tablet Take 500 mg by mouth 2 (two) times daily with a meal.  . Multiple Vitamins-Minerals (CENTRUM ADULTS) TABS Take 1 tablet by mouth daily.     Allergies:   Daypro [oxaprozin] and Advil [ibuprofen]   Social History   Socioeconomic History  . Marital status: Widowed    Spouse name: Not on file  .  Number of children: Not on file  . Years of education: Not on file  . Highest education level: Not on file  Occupational History  . Not on file  Tobacco Use  . Smoking status: Never Smoker  . Smokeless tobacco: Never Used  Substance and Sexual Activity  . Alcohol use: No  . Drug use: No  . Sexual activity: Never  Other Topics Concern  . Not on file  Social History Narrative  . Not on file   Social Determinants of Health   Financial Resource Strain:   . Difficulty of Paying Living Expenses:   Food  Insecurity:   . Worried About Charity fundraiser in the Last Year:   . Arboriculturist in the Last Year:   Transportation Needs:   . Film/video editor (Medical):   [name] Lack of Transportation (Non-Medical):   Physical Activity:   . Days of Exercise per Week:   . Minutes of Exercise per Session:   Stress:   . Feeling of Stress :   Social Connections:   . Frequency of Communication with Friends and Family:   . Frequency of Social Gatherings with Friends and Family:   . Attends Religious Services:   . Active Member of Clubs or Organizations:   . Attends Archivist Meetings:   [name] Marital Status:      Family History:  The patient's family history includes Cancer in her brother, mother, and sister; Heart Problems in her father.   ROS:   Please see the history of present illness.    ROS All other systems reviewed and are negative.   PHYSICAL EXAM:   VS:  BP (!) 152/86   Pulse (!) 54   Temp (!) 97.2 F (36.2 C) (Temporal)   Ht 5' (1.524 m)   Wt 149 lb 3.2 oz (67.7 kg)   SpO2 97%   BMI 29.14 kg/m   Physical Exam  GEN: Well nourished, well developed, in no acute distress, very hard of hearing Neck: no JVD, carotid bruits, or masses Cardiac:RRR; no murmurs, rubs, or gallops  Respiratory:  clear to auscultation bilaterally, normal work of breathing GI: soft, nontender, nondistended, + BS Ext: without cyanosis, clubbing, or edema, Good distal pulses bilaterally Neuro:  Alert and Oriented x 3 Psych: euthymic mood, full affect  Wt Readings from Last 3 Encounters:  03/17/20 149 lb 3.2 oz (67.7 kg)  07/11/19 149 lb (67.6 kg)  05/27/19 152 lb 1.9 oz (69 kg)      Studies/Labs Reviewed:   EKG:  EKG is not ordered today.   Recent Labs: 05/27/2019: ALT 50 07/26/2019: BUN 21; Creatinine, Ser 1.17; Hemoglobin 11.2; Platelets 117; Potassium 4.8; Sodium 139   Lipid Panel No results found for: CHOL, TRIG, HDL, CHOLHDL, VLDL, LDLCALC, LDLDIRECT  Additional studies/ records  that were reviewed today include:  Cardiac cath 07/27/2017  Mid LAD lesion, 30 %stenosed.  Ost 1st Diag to 1st Diag lesion, 40 %stenosed.  Mid LAD to Dist LAD lesion, 20 %stenosed.  There is severe left ventricular systolic dysfunction.  LV end diastolic pressure is moderately elevated.  The left ventricular ejection fraction is 25-35% by visual estimate.   1. Mild nonobstructive coronary artery disease. 2. Severely reduced LV systolic function with an EF of 25-30% with wall motion abnormality consistent with stress-induced cardiomyopathy. 3. Moderately elevated left ventricular end-diastolic pressure.   Recommendations: Recommend medical therapy for stress-induced cardiomyopathy. I resumed small dose carvedilol and ACE inhibitor. If renal function  is stable, consider gentle diuresis. Avoid catheterization via the right radial artery in the future due to significant right radial loop that could not be straightened out with the catheter.       ASSESSMENT:    1. Coronary artery disease involving native coronary artery of native heart without angina pectoris   2. Ischemic cardiomyopathy   3. PSVT (paroxysmal supraventricular tachycardia) ([place])   4. Essential hypertension   5. Educated about COVID-19 virus infection      PLAN:  In order of problems listed above:  CAD S/p NSTEMI 07/2017 with cath revealing mild nonobstructive disease LVEF 25 to 30% with wall motion abnormalities suggesting stress-induced cardiomyopathy.  Echo never done.  Ordered last year but still not done-daughter said she had one at [facility] after her MI 2 years ago and at 53 does not want to bring her in for 1.  Cardiomyopathy LVEF 25 to 30% on cath in 2018-compensated without evidence of heart failure.  Continue carvedilol  PSVT denies palpitations  Hypertension blood pressure is borderline but because of advanced age and history of intermittent dizziness blood pressures in the 150s are acceptable per Dr.  Harl Bowie.  We will not add any further treatment.  COVID-19 education.  Patient declines the vaccine     Medication Adjustments/Labs and Tests Ordered: Current medicines are reviewed at length with the patient today.  Concerns regarding medicines are outlined above.  Medication changes, Labs and Tests ordered today are listed in the Patient Instructions below. Patient Instructions  Medication Instructions:  Your physician recommends that you continue on your current medications as directed. Please refer to the Current Medication list given to you today.  *If you need a refill on your cardiac medications before your next appointment, please call your pharmacy*   Lab Work: None If you have labs (blood work) drawn today and your tests are completely normal, you will receive your results only by: [name] MyChart Message (if you have MyChart) OR . A paper copy in the mail If you have any lab test that is abnormal or we need to change your treatment, we will call you to review the results.   Testing/Procedures: None   Follow-Up: At [facility], you and your health needs are our priority.  As part of our continuing mission to provide you with exceptional heart care, we have created designated Provider Care Teams.  These Care Teams include your primary Cardiologist (physician) and Advanced Practice Providers (APPs -  Physician Assistants and Nurse Practitioners) who all work together to provide you with the care you need, when you need it.  We recommend signing up for the patient portal called "MyChart".  Sign up information is provided on this After Visit Summary.  MyChart is used to connect with patients for Virtual Visits (Telemedicine).  Patients are able to view lab/test results, encounter notes, upcoming appointments, etc.  Non-urgent messages can be sent to your provider as well.   To learn more about what you can do with MyChart, go to [email or web address].    Your next appointment:     6 month(s)  The format for your next appointment:   In Person  Provider:   Carlyle Dolly, MD       Signed, Ermalinda Barrios, PA-C  03/17/2020 1:56 PM    [facility] [address], [place], [place]  [address] Phone: [phone]; Fax: [phone]

## 2020-03-17 ENCOUNTER — Ambulatory Visit: Payer: Medicare Other | Admitting: Physician Assistant

## 2020-03-17 ENCOUNTER — Encounter: Payer: Self-pay | Admitting: Physician Assistant

## 2020-03-17 VITALS — BP 152/86 | HR 54 | Temp 97.2°F | Ht 60.0 in | Wt 149.2 lb

## 2020-03-17 DIAGNOSIS — I2510 Atherosclerotic heart disease of native coronary artery without angina pectoris: Secondary | ICD-10-CM | POA: Diagnosis not present

## 2020-03-17 DIAGNOSIS — Z7189 Other specified counseling: Secondary | ICD-10-CM

## 2020-03-17 DIAGNOSIS — I471 Supraventricular tachycardia: Secondary | ICD-10-CM

## 2020-03-17 DIAGNOSIS — I10 Essential (primary) hypertension: Secondary | ICD-10-CM

## 2020-03-17 DIAGNOSIS — I255 Ischemic cardiomyopathy: Secondary | ICD-10-CM

## 2020-03-17 NOTE — Patient Instructions (Signed)
Medication Instructions:  Your physician recommends that you continue on your current medications as directed. Please refer to the Current Medication list given to you today.  *If you need a refill on your cardiac medications before your next appointment, please call your pharmacy*   Lab Work: None If you have labs (blood work) drawn today and your tests are completely normal, you will receive your results only by: [name] MyChart Message (if you have MyChart) OR . A paper copy in the mail If you have any lab test that is abnormal or we need to change your treatment, we will call you to review the results.   Testing/Procedures: None   Follow-Up: At [facility], you and your health needs are our priority.  As part of our continuing mission to provide you with exceptional heart care, we have created designated Provider Care Teams.  These Care Teams include your primary Cardiologist (physician) and Advanced Practice Providers (APPs -  Physician Assistants and Nurse Practitioners) who all work together to provide you with the care you need, when you need it.  We recommend signing up for the patient portal called "MyChart".  Sign up information is provided on this After Visit Summary.  MyChart is used to connect with patients for Virtual Visits (Telemedicine).  Patients are able to view lab/test results, encounter notes, upcoming appointments, etc.  Non-urgent messages can be sent to your provider as well.   To learn more about what you can do with MyChart, go to [email or web address].    Your next appointment:   6 month(s)  The format for your next appointment:   In Person  Provider:   Carlyle Dolly, MD

## 2020-03-20 DIAGNOSIS — D649 Anemia, unspecified: Secondary | ICD-10-CM | POA: Diagnosis not present

## 2020-03-20 DIAGNOSIS — D509 Iron deficiency anemia, unspecified: Secondary | ICD-10-CM | POA: Diagnosis not present

## 2020-03-20 DIAGNOSIS — D631 Anemia in chronic kidney disease: Secondary | ICD-10-CM | POA: Diagnosis not present

## 2020-03-20 DIAGNOSIS — D696 Thrombocytopenia, unspecified: Secondary | ICD-10-CM | POA: Diagnosis not present

## 2020-03-20 DIAGNOSIS — E1122 Type 2 diabetes mellitus with diabetic chronic kidney disease: Secondary | ICD-10-CM | POA: Diagnosis not present

## 2020-03-21 DIAGNOSIS — I2510 Atherosclerotic heart disease of native coronary artery without angina pectoris: Secondary | ICD-10-CM | POA: Diagnosis not present

## 2020-03-21 DIAGNOSIS — E782 Mixed hyperlipidemia: Secondary | ICD-10-CM | POA: Diagnosis not present

## 2020-03-21 DIAGNOSIS — I129 Hypertensive chronic kidney disease with stage 1 through stage 4 chronic kidney disease, or unspecified chronic kidney disease: Secondary | ICD-10-CM | POA: Diagnosis not present

## 2020-03-21 DIAGNOSIS — D631 Anemia in chronic kidney disease: Secondary | ICD-10-CM | POA: Diagnosis not present

## 2020-03-21 DIAGNOSIS — E1122 Type 2 diabetes mellitus with diabetic chronic kidney disease: Secondary | ICD-10-CM | POA: Diagnosis not present

## 2020-04-04 DIAGNOSIS — I130 Hypertensive heart and chronic kidney disease with heart failure and stage 1 through stage 4 chronic kidney disease, or unspecified chronic kidney disease: Secondary | ICD-10-CM | POA: Diagnosis not present

## 2020-04-04 DIAGNOSIS — I5020 Unspecified systolic (congestive) heart failure: Secondary | ICD-10-CM | POA: Diagnosis not present

## 2020-04-04 DIAGNOSIS — E1122 Type 2 diabetes mellitus with diabetic chronic kidney disease: Secondary | ICD-10-CM | POA: Diagnosis not present

## 2020-04-04 DIAGNOSIS — I10 Essential (primary) hypertension: Secondary | ICD-10-CM | POA: Diagnosis not present

## 2020-04-04 DIAGNOSIS — N183 Chronic kidney disease, stage 3 unspecified: Secondary | ICD-10-CM | POA: Diagnosis not present

## 2020-05-15 DIAGNOSIS — I5020 Unspecified systolic (congestive) heart failure: Secondary | ICD-10-CM | POA: Diagnosis not present

## 2020-05-15 DIAGNOSIS — I130 Hypertensive heart and chronic kidney disease with heart failure and stage 1 through stage 4 chronic kidney disease, or unspecified chronic kidney disease: Secondary | ICD-10-CM | POA: Diagnosis not present

## 2020-05-15 DIAGNOSIS — I10 Essential (primary) hypertension: Secondary | ICD-10-CM | POA: Diagnosis not present

## 2020-05-15 DIAGNOSIS — E1122 Type 2 diabetes mellitus with diabetic chronic kidney disease: Secondary | ICD-10-CM | POA: Diagnosis not present

## 2020-05-15 DIAGNOSIS — N189 Chronic kidney disease, unspecified: Secondary | ICD-10-CM | POA: Diagnosis not present

## 2020-05-19 DIAGNOSIS — L570 Actinic keratosis: Secondary | ICD-10-CM | POA: Diagnosis not present

## 2020-05-19 DIAGNOSIS — D0439 Carcinoma in situ of skin of other parts of face: Secondary | ICD-10-CM | POA: Diagnosis not present

## 2020-05-23 ENCOUNTER — Other Ambulatory Visit: Payer: Self-pay

## 2020-05-23 ENCOUNTER — Encounter (HOSPITAL_COMMUNITY): Payer: Self-pay | Admitting: Emergency Medicine

## 2020-05-23 ENCOUNTER — Emergency Department (HOSPITAL_COMMUNITY)
Admission: EM | Admit: 2020-05-23 | Discharge: 2020-05-23 | Disposition: A | Discharge status: Home / Self Care | Payer: Medicare Other | Attending: Emergency Medicine | Admitting: Emergency Medicine

## 2020-05-23 DIAGNOSIS — I10 Essential (primary) hypertension: Secondary | ICD-10-CM | POA: Diagnosis not present

## 2020-05-23 DIAGNOSIS — E119 Type 2 diabetes mellitus without complications: Secondary | ICD-10-CM | POA: Insufficient documentation

## 2020-05-23 DIAGNOSIS — I110 Hypertensive heart disease with heart failure: Secondary | ICD-10-CM | POA: Diagnosis not present

## 2020-05-23 DIAGNOSIS — L7622 Postprocedural hemorrhage and hematoma of skin and subcutaneous tissue following other procedure: Secondary | ICD-10-CM | POA: Insufficient documentation

## 2020-05-23 DIAGNOSIS — Z7982 Long term (current) use of aspirin: Secondary | ICD-10-CM | POA: Diagnosis not present

## 2020-05-23 DIAGNOSIS — I509 Heart failure, unspecified: Secondary | ICD-10-CM | POA: Diagnosis not present

## 2020-05-23 DIAGNOSIS — Z7984 Long term (current) use of oral hypoglycemic drugs: Secondary | ICD-10-CM | POA: Insufficient documentation

## 2020-05-23 DIAGNOSIS — I2510 Atherosclerotic heart disease of native coronary artery without angina pectoris: Secondary | ICD-10-CM | POA: Diagnosis not present

## 2020-05-23 DIAGNOSIS — T148XXA Other injury of unspecified body region, initial encounter: Secondary | ICD-10-CM

## 2020-05-23 DIAGNOSIS — R410 Disorientation, unspecified: Secondary | ICD-10-CM | POA: Diagnosis not present

## 2020-05-23 DIAGNOSIS — R58 Hemorrhage, not elsewhere classified: Secondary | ICD-10-CM | POA: Diagnosis not present

## 2020-05-23 DIAGNOSIS — F0390 Unspecified dementia without behavioral disturbance: Secondary | ICD-10-CM | POA: Insufficient documentation

## 2020-05-23 DIAGNOSIS — Z79899 Other long term (current) drug therapy: Secondary | ICD-10-CM | POA: Diagnosis not present

## 2020-05-23 MED ORDER — "THROMBI-PAD 3""X3"" EX PADS"
1.0000 | MEDICATED_PAD | Freq: Once | CUTANEOUS | Status: AC
  Administered 2020-05-23: 1 via TOPICAL
  Filled 2020-05-23: qty 1

## 2020-05-23 NOTE — ED Triage Notes (Signed)
Pt had skin cancer removed from her face on Monday. Per EMS pt picked at scab and made the scab bleed.

## 2020-05-23 NOTE — Discharge Instructions (Signed)
Keep the dressing in place for 24 hours.

## 2020-05-23 NOTE — ED Provider Notes (Signed)
Blood pressure (!) 181/72, pulse (!) 57, temperature 98.2 F (36.8 C), temperature source Oral, resp. rate 18, SpO2 94 %.  Assuming care from Dr. Roxanne Mins.  In short, Robin Guzman is a 86 y.o. female with a chief complaint of Bleeding/Bruising .  Refer to the original H&P for additional details.  The current plan of care is to f/u on dressing after initial treatment.   07:53 AM  When back to reassess patient.  Dressings are clean and dry.  No active bleeding.  Advised that she call her dermatology office today to make them aware of the bleeding complication and schedule follow-up.  We will keep the dressing in place.  Patient sent home with extra materials for dressing changes with next dressing change advised later today or tomorrow.     Margette Fast, MD 05/23/20 [phone]

## 2020-05-23 NOTE — ED Notes (Signed)
Pt bleeding through bandage applied by EMS. Removed soiled bandage and applied pressure dsg using coban to L temporal area.

## 2020-05-23 NOTE — ED Provider Notes (Signed)
[facility] Provider Note   CSN: [id number] Arrival date & time: 05/23/20  0418   History Chief Complaint  Patient presents with  . Bleeding/Bruising    Robin Guzman is a 86 y.o. female.  The history is provided by a relative. The history is limited by the condition of the patient (Dementia).  She has history of diabetes, hypertension, hyperlipidemia, heart failure, coronary artery disease and comes in because of bleeding.  She had a skin cancer removed from the left temple area 4 days ago.  There was some bleeding and she saw the dermatologist again yesterday who put something on it to help promote clotting.  During the night, she apparently remove the dressing and there was more bleeding so she was brought in to the emergency department.  She is on aspirin but no other antiplatelet agents and no anticoagulants.  Past Medical History:  Diagnosis Date  . Arthritis   . CAD (coronary artery disease), native coronary artery    07/27/17 nonobstructive, EF 25-35% by LV gram  . CHF (congestive heart failure) ([facility])   . Dementia ([place])   . Diabetes mellitus without complication ([place])   . Dyslipidemia   . Dyspnea   . Dyspnea on exertion   . History of stress test 07/05/2006   High risk scan cardiac cathe was recommended.  [name] Hx of echocardiogram 04/09/2011   EF 55% Mildly hypertrophic left ventricle with normal systolic function, Moderate (grade II) diastolic dysfunction. Elevated left atrial pressure. No significant valvular abnormalities. No pericardial effusion. Mild to moderate pulmonary arterial hypertension.  . Hypercholesteremia   . Hypertension   . Myocardial infarct ([place])   . Skin cancer    HISTORY OF     Patient Active Problem List   Diagnosis Date Noted  . SVT (supraventricular tachycardia) ([facility]) 10/23/2018  . PAT (paroxysmal atrial tachycardia) ([place]) 04/23/2018  . LBBB (left bundle branch block) 04/23/2018  . Dyslipidemia 04/23/2018  .  Atherosclerosis of aorta ([place]) 04/23/2018  . Acute systolic heart failure ([place]) 07/28/2017  . Sundowning 07/28/2017  . NSTEMI (non-ST elevated myocardial infarction) ([place]) 07/26/2017  . Closed 3-part fracture of proximal humerus, left, with routine healing, subsequent encounter 12/02/2016  . UTI (urinary tract infection) 12/25/2013  . Dyspnea 12/25/2013  . Near syncope 12/25/2013  . CAD (coronary artery disease) 12/25/2013  . Chronic diastolic heart failure ([place]) 08/07/2013  . Essential hypertension 08/07/2013  . Controlled type 2 diabetes mellitus without complication, without long-term current use of insulin ([place]) 08/07/2013  . Hypercholesteremia 08/07/2013    Past Surgical History:  Procedure Laterality Date  . ABDOMINAL HYSTERECTOMY    . CATARACT EXTRACTION W/PHACO Right 05/20/2017   Procedure: CATARACT EXTRACTION PHACO AND INTRAOCULAR LENS PLACEMENT RIGHT EYE;  Surgeon: Tonny Branch, MD;  Location: [facility];  Service: Ophthalmology;  Laterality: Right;  CDE: 17.77  . CATARACT EXTRACTION W/PHACO Left 06/21/2017   Procedure: CATARACT EXTRACTION PHACO AND INTRAOCULAR LENS PLACEMENT (IOC);  Surgeon: Tonny Branch, MD;  Location: [facility];  Service: Ophthalmology;  Laterality: Left;  CDE: 8.90  . LEFT HEART CATH AND CORONARY ANGIOGRAPHY N/A 07/27/2017   Procedure: LEFT HEART CATH AND CORONARY ANGIOGRAPHY;  Surgeon: Wellington Hampshire, MD;  Location: [facility];  Service: Cardiovascular;  Laterality: N/A;     OB History    Gravida  3   Para  3   Term  3   Preterm      AB      Living  SAB      TAB      Ectopic      Multiple      Live Births              Family History  Problem Relation Age of Onset  . Cancer Mother   . Heart Problems Father   . Cancer Brother        brain  . Cancer Sister        brain    Social History   Tobacco Use  . Smoking status: Never Smoker  . Smokeless tobacco: Never Used  Vaping Use  . Vaping Use: Never used  Substance  Use Topics  . Alcohol use: No  . Drug use: No    Home Medications Prior to Admission medications   Medication Sig Start Date End Date Taking? Authorizing Provider  aspirin EC 81 MG tablet Take 81 mg by mouth daily.    [provider]  atorvastatin (LIPITOR) 10 MG tablet Take 10 mg by mouth daily.    [provider]  carvedilol (COREG) 25 MG tablet Take 1 tablet (25 mg total) by mouth 2 (two) times daily with a meal. 10/23/18   Croitoru, Mihai, MD  glipiZIDE (GLUCOTROL) 5 MG tablet Take 5 mg by mouth 2 (two) times daily before a meal.     [provider]  metFORMIN (GLUCOPHAGE) 500 MG tablet Take 500 mg by mouth 2 (two) times daily with a meal.    [provider]  Multiple Vitamins-Minerals (CENTRUM ADULTS) TABS Take 1 tablet by mouth daily.    [provider]    Allergies    Daypro [oxaprozin] and Advil [ibuprofen]  Review of Systems   Review of Systems  Unable to perform ROS: Dementia    Physical Exam Updated Vital Signs BP (!) 181/72   Pulse (!) 57   Temp 98.2 F (36.8 C) (Oral)   Resp 18   SpO2 94%   Physical Exam Vitals and nursing note reviewed.   86 year old female, resting comfortably and in no acute distress. Vital signs are significant for elevated blood pressure and mildly decreased heart rate. Oxygen saturation is 94%, which is normal. Head is normocephalic. PERRLA, EOMI. Oropharynx is clear.  Approximately 1 cm diameter skin defect in the left temporal area with slight oozing of blood. Neck is nontender and supple without adenopathy or JVD. Back is nontender and there is no CVA tenderness. Lungs are clear without rales, wheezes, or rhonchi. Chest is nontender. Heart has regular rate and rhythm without murmur. Abdomen is soft, flat, nontender without masses or hepatosplenomegaly and peristalsis is normoactive. Extremities have no cyanosis or edema, full range of motion is present. Skin is warm and dry without  rash. Neurologic: Awake and alert but noncommunicative, cranial nerves are intact, there are no motor or sensory deficits.  ED Results / Procedures / Treatments    Procedures Procedures   Medications Ordered in ED Medications  Thrombi-Pad 3"X3" pad 1 each (has no administration in time range)    ED Course  I have reviewed the triage vital signs and the nursing notes.  MDM Rules/Calculators/A&P Bleeding from site of excision of skin cancer.  We will try topical thrombin pad.  Old records are reviewed, and she has no relevant past visits.  At least initially, site appears to be dry.  She will need to be observed for 45 minutes in the ED.  Case is signed out to [place].  Dressing stays  dry, anticipate sending her home with dressing in place and maintain dressing for 24 hours.  Final Clinical Impression(s) / ED Diagnoses Final diagnoses:  Bleeding from wound  Elevated blood pressure reading with diagnosis of hypertension    Rx / DC Orders ED Discharge Orders    None       Delora Fuel, MD 53/01/04 [phone]

## 2020-06-04 DIAGNOSIS — I10 Essential (primary) hypertension: Secondary | ICD-10-CM | POA: Diagnosis not present

## 2020-06-04 DIAGNOSIS — I130 Hypertensive heart and chronic kidney disease with heart failure and stage 1 through stage 4 chronic kidney disease, or unspecified chronic kidney disease: Secondary | ICD-10-CM | POA: Diagnosis not present

## 2020-06-04 DIAGNOSIS — N189 Chronic kidney disease, unspecified: Secondary | ICD-10-CM | POA: Diagnosis not present

## 2020-06-04 DIAGNOSIS — I5020 Unspecified systolic (congestive) heart failure: Secondary | ICD-10-CM | POA: Diagnosis not present

## 2020-06-04 DIAGNOSIS — E1122 Type 2 diabetes mellitus with diabetic chronic kidney disease: Secondary | ICD-10-CM | POA: Diagnosis not present

## 2020-06-22 ENCOUNTER — Emergency Department (HOSPITAL_COMMUNITY): Payer: Medicare Other

## 2020-06-22 ENCOUNTER — Encounter (HOSPITAL_COMMUNITY): Payer: Self-pay

## 2020-06-22 ENCOUNTER — Observation Stay (HOSPITAL_COMMUNITY)
Admission: EM | Admit: 2020-06-22 | Discharge: 2020-06-25 | Disposition: A | Discharge status: Home / Self Care | Payer: Medicare Other | Attending: Family Medicine | Admitting: Family Medicine

## 2020-06-22 ENCOUNTER — Other Ambulatory Visit: Payer: Self-pay

## 2020-06-22 DIAGNOSIS — Z7982 Long term (current) use of aspirin: Secondary | ICD-10-CM | POA: Diagnosis not present

## 2020-06-22 DIAGNOSIS — Z79899 Other long term (current) drug therapy: Secondary | ICD-10-CM | POA: Insufficient documentation

## 2020-06-22 DIAGNOSIS — I10 Essential (primary) hypertension: Secondary | ICD-10-CM | POA: Diagnosis not present

## 2020-06-22 DIAGNOSIS — D539 Nutritional anemia, unspecified: Secondary | ICD-10-CM

## 2020-06-22 DIAGNOSIS — E785 Hyperlipidemia, unspecified: Secondary | ICD-10-CM | POA: Diagnosis not present

## 2020-06-22 DIAGNOSIS — M549 Dorsalgia, unspecified: Secondary | ICD-10-CM

## 2020-06-22 DIAGNOSIS — J984 Other disorders of lung: Secondary | ICD-10-CM | POA: Diagnosis not present

## 2020-06-22 DIAGNOSIS — Y929 Unspecified place or not applicable: Secondary | ICD-10-CM | POA: Diagnosis not present

## 2020-06-22 DIAGNOSIS — E1165 Type 2 diabetes mellitus with hyperglycemia: Secondary | ICD-10-CM

## 2020-06-22 DIAGNOSIS — I447 Left bundle-branch block, unspecified: Secondary | ICD-10-CM | POA: Diagnosis not present

## 2020-06-22 DIAGNOSIS — M546 Pain in thoracic spine: Secondary | ICD-10-CM | POA: Diagnosis not present

## 2020-06-22 DIAGNOSIS — F0390 Unspecified dementia without behavioral disturbance: Secondary | ICD-10-CM | POA: Diagnosis not present

## 2020-06-22 DIAGNOSIS — I2510 Atherosclerotic heart disease of native coronary artery without angina pectoris: Secondary | ICD-10-CM | POA: Diagnosis not present

## 2020-06-22 DIAGNOSIS — Y999 Unspecified external cause status: Secondary | ICD-10-CM | POA: Diagnosis not present

## 2020-06-22 DIAGNOSIS — Y939 Activity, unspecified: Secondary | ICD-10-CM | POA: Diagnosis not present

## 2020-06-22 DIAGNOSIS — W19XXXA Unspecified fall, initial encounter: Secondary | ICD-10-CM | POA: Diagnosis not present

## 2020-06-22 DIAGNOSIS — Z7984 Long term (current) use of oral hypoglycemic drugs: Secondary | ICD-10-CM | POA: Insufficient documentation

## 2020-06-22 DIAGNOSIS — D72829 Elevated white blood cell count, unspecified: Secondary | ICD-10-CM

## 2020-06-22 DIAGNOSIS — W010XXA Fall on same level from slipping, tripping and stumbling without subsequent striking against object, initial encounter: Secondary | ICD-10-CM | POA: Insufficient documentation

## 2020-06-22 DIAGNOSIS — S32010A Wedge compression fracture of first lumbar vertebra, initial encounter for closed fracture: Principal | ICD-10-CM | POA: Insufficient documentation

## 2020-06-22 DIAGNOSIS — I110 Hypertensive heart disease with heart failure: Secondary | ICD-10-CM | POA: Insufficient documentation

## 2020-06-22 DIAGNOSIS — Z20822 Contact with and (suspected) exposure to covid-19: Secondary | ICD-10-CM | POA: Insufficient documentation

## 2020-06-22 DIAGNOSIS — Z85828 Personal history of other malignant neoplasm of skin: Secondary | ICD-10-CM | POA: Insufficient documentation

## 2020-06-22 DIAGNOSIS — I5032 Chronic diastolic (congestive) heart failure: Secondary | ICD-10-CM | POA: Diagnosis not present

## 2020-06-22 DIAGNOSIS — S34101A Unspecified injury to L1 level of lumbar spinal cord, initial encounter: Secondary | ICD-10-CM | POA: Diagnosis present

## 2020-06-22 DIAGNOSIS — R52 Pain, unspecified: Secondary | ICD-10-CM | POA: Diagnosis not present

## 2020-06-22 DIAGNOSIS — I509 Heart failure, unspecified: Secondary | ICD-10-CM | POA: Diagnosis not present

## 2020-06-22 DIAGNOSIS — S79911A Unspecified injury of right hip, initial encounter: Secondary | ICD-10-CM | POA: Diagnosis not present

## 2020-06-22 DIAGNOSIS — S79912A Unspecified injury of left hip, initial encounter: Secondary | ICD-10-CM | POA: Diagnosis not present

## 2020-06-22 DIAGNOSIS — S299XXA Unspecified injury of thorax, initial encounter: Secondary | ICD-10-CM | POA: Diagnosis not present

## 2020-06-22 DIAGNOSIS — D696 Thrombocytopenia, unspecified: Secondary | ICD-10-CM

## 2020-06-22 DIAGNOSIS — M545 Low back pain: Secondary | ICD-10-CM | POA: Diagnosis not present

## 2020-06-22 LAB — CBG MONITORING, ED: Glucose-Capillary: 234 mg/dL — ABNORMAL HIGH (ref 70–99)

## 2020-06-22 MED ORDER — HYDROCODONE-ACETAMINOPHEN 5-325 MG PO TABS
0.5000 | ORAL_TABLET | Freq: Once | ORAL | Status: AC
  Administered 2020-06-22: 0.5 via ORAL
  Filled 2020-06-22: qty 1

## 2020-06-22 MED ORDER — ACETAMINOPHEN 325 MG PO TABS
650.0000 mg | ORAL_TABLET | Freq: Once | ORAL | Status: AC
  Administered 2020-06-22: 650 mg via ORAL
  Filled 2020-06-22: qty 2

## 2020-06-22 NOTE — ED Notes (Signed)
[facility] at bedside fitting Pt with TLSO Brace

## 2020-06-22 NOTE — ED Notes (Signed)
Pt ambulated to BR with x 1 assist.

## 2020-06-22 NOTE — ED Triage Notes (Signed)
EMS reports pt was walking out a room in her house and lost her balance and fell on her back.  EMS says pt denies passing out.  Reports sacral pain.  Pt usually ambulates with a cane but now unable to put pressure on the r leg.

## 2020-06-22 NOTE — Progress Notes (Signed)
Orthopedic Tech Progress Note Patient Details:  ANIAH PAULI 1928-05-05 [id number]  Patient ID: [name], female   DOB: Apr 05, 1928, 86 y.o.   MRN: [id number] [facility] to [facility]  Karolee Stamps 06/22/2020, 8:19 PM

## 2020-06-22 NOTE — ED Provider Notes (Signed)
[facility] EMERGENCY DEPARTMENT Provider Note   CSN: [id number] Arrival date & time: 06/22/20  1529     History Chief Complaint  Patient presents with  . Fall    Robin Guzman is a 86 y.o. female with a past medical history of dementia, diabetes, very hard of hearing, who presents today for evaluation after a fall.  She states that she slipped on the floor causing her legs to "curl up under me."  She denies striking her head or passing out.  No pain in her head or in her neck.  She does not take any blood thinning medications.  She reports that she has pain in her lower back.  And skin tear on her left elbow.  She states that she is currently unable to put any pressure on the right leg due to pain.  Usually ambulates with a cane.  She otherwise feels well.  No abdominal pain or chest pain.  HPI     Past Medical History:  Diagnosis Date  . Arthritis   . CAD (coronary artery disease), native coronary artery    07/27/17 nonobstructive, EF 25-35% by LV gram  . CHF (congestive heart failure) ([place])   . Dementia ([place])   . Diabetes mellitus without complication ([place])   . Dyslipidemia   . Dyspnea   . Dyspnea on exertion   . History of stress test 07/05/2006   High risk scan cardiac cathe was recommended.  [name] Hx of echocardiogram 04/09/2011   EF 55% Mildly hypertrophic left ventricle with normal systolic function, Moderate (grade II) diastolic dysfunction. Elevated left atrial pressure. No significant valvular abnormalities. No pericardial effusion. Mild to moderate pulmonary arterial hypertension.  . Hypercholesteremia   . Hypertension   . Myocardial infarct ([place])   . Skin cancer    HISTORY OF     Patient Active Problem List   Diagnosis Date Noted  . Compression fracture of L1 vertebra ([facility]) 06/23/2020  . Leukocytosis 06/23/2020  . Macrocytic anemia 06/23/2020  . Hyperglycemia due to diabetes mellitus ([place]) 06/23/2020  . Thrombocytopenia ([place]) 06/23/2020  . Back pain  06/23/2020  . Fall at home, initial encounter 06/23/2020  . SVT (supraventricular tachycardia) ([place]) 10/23/2018  . PAT (paroxysmal atrial tachycardia) ([place]) 04/23/2018  . LBBB (left bundle branch block) 04/23/2018  . Dyslipidemia 04/23/2018  . Atherosclerosis of aorta ([place]) 04/23/2018  . Acute systolic heart failure ([place]) 07/28/2017  . Sundowning 07/28/2017  . NSTEMI (non-ST elevated myocardial infarction) ([place]) 07/26/2017  . Closed 3-part fracture of proximal humerus, left, with routine healing, subsequent encounter 12/02/2016  . UTI (urinary tract infection) 12/25/2013  . Dyspnea 12/25/2013  . Near syncope 12/25/2013  . CAD (coronary artery disease) 12/25/2013  . Chronic diastolic heart failure ([place]) 08/07/2013  . Essential hypertension 08/07/2013  . Controlled type 2 diabetes mellitus without complication, without long-term current use of insulin ([place]) 08/07/2013  . Hypercholesteremia 08/07/2013    Past Surgical History:  Procedure Laterality Date  . ABDOMINAL HYSTERECTOMY    . CATARACT EXTRACTION W/PHACO Right 05/20/2017   Procedure: CATARACT EXTRACTION PHACO AND INTRAOCULAR LENS PLACEMENT RIGHT EYE;  Surgeon: Tonny Branch, MD;  Location: [facility];  Service: Ophthalmology;  Laterality: Right;  CDE: 17.77  . CATARACT EXTRACTION W/PHACO Left 06/21/2017   Procedure: CATARACT EXTRACTION PHACO AND INTRAOCULAR LENS PLACEMENT (IOC);  Surgeon: Tonny Branch, MD;  Location: [facility];  Service: Ophthalmology;  Laterality: Left;  CDE: 8.90  . LEFT HEART CATH AND CORONARY ANGIOGRAPHY N/A 07/27/2017   Procedure:  LEFT HEART CATH AND CORONARY ANGIOGRAPHY;  Surgeon: Wellington Hampshire, MD;  Location: [facility];  Service: Cardiovascular;  Laterality: N/A;     OB History    Gravida  3   Para  3   Term  3   Preterm      AB      Living        SAB      TAB      Ectopic      Multiple      Live Births              Family History  Problem Relation Age of Onset  . Cancer Mother    . Heart Problems Father   . Cancer Brother        brain  . Cancer Sister        brain    Social History   Tobacco Use  . Smoking status: Never Smoker  . Smokeless tobacco: Never Used  Vaping Use  . Vaping Use: Never used  Substance Use Topics  . Alcohol use: No  . Drug use: No    Home Medications Prior to Admission medications   Medication Sig Start Date End Date Taking? Authorizing Provider  acetaminophen (TYLENOL) 500 MG tablet Take 1,000 mg by mouth in the morning and at bedtime.   Yes [provider]  aspirin EC 81 MG tablet Take 81 mg by mouth daily.   Yes [provider]  atorvastatin (LIPITOR) 10 MG tablet Take 10 mg by mouth daily.   Yes [provider]  carvedilol (COREG) 25 MG tablet Take 1 tablet (25 mg total) by mouth 2 (two) times daily with a meal. 10/23/18  Yes Croitoru, Mihai, MD  glipiZIDE (GLUCOTROL) 5 MG tablet Take 5 mg by mouth 2 (two) times daily before a meal.    Yes [provider]  metFORMIN (GLUCOPHAGE) 500 MG tablet Take 500 mg by mouth 2 (two) times daily with a meal.   Yes [provider]  Multiple Vitamins-Minerals (CENTRUM ADULTS) TABS Take 1 tablet by mouth daily.   Yes [provider]    Allergies    Daypro [oxaprozin] and Advil [ibuprofen]  Review of Systems   Review of Systems  Constitutional: Negative for chills and fever.  HENT: Negative for congestion.   Respiratory: Negative for chest tightness and shortness of breath.   Cardiovascular: Negative for chest pain.  Gastrointestinal: Negative for abdominal pain, diarrhea, nausea and vomiting.  Musculoskeletal: Positive for back pain. Negative for neck pain.  Skin: Positive for wound. Negative for color change.  Neurological: Negative for weakness and headaches.  Psychiatric/Behavioral: Negative for agitation.  All other systems reviewed and are negative.   Physical Exam Updated Vital Signs BP (!) 149/103 (BP Location: Left Arm)    Pulse 90   Temp 97.9 F (36.6 C) (Oral)   Resp 18   Wt 67 kg   SpO2 92%   BMI 28.85 kg/m   Physical Exam Vitals and nursing note reviewed.  Constitutional:      General: She is not in acute distress.    Appearance: She is well-developed. She is not diaphoretic.  HENT:     Head: Normocephalic and atraumatic.  Eyes:     General: No scleral icterus.       Right eye: No discharge.        Left eye: No discharge.     Conjunctiva/sclera: Conjunctivae normal.  Cardiovascular:  Rate and Rhythm: Normal rate and regular rhythm.  Pulmonary:     Effort: Pulmonary effort is normal. No respiratory distress.     Breath sounds: No stridor.  Abdominal:     General: There is no distension.  Musculoskeletal:        General: No deformity.     Cervical back: Normal range of motion.     Comments: No pain with logroll of bilateral legs.  No pain with pelvis compression.  There is diffuse mid to lower back pain.  No crepitus or deformities palpated.  Skin:    General: Skin is warm and dry.  Neurological:     Mental Status: She is alert.     Motor: No abnormal muscle tone.  Psychiatric:        Behavior: Behavior normal.     ED Results / Procedures / Treatments   Labs (all labs ordered are listed, but only abnormal results are displayed) Labs Reviewed  CBC WITH DIFFERENTIAL/PLATELET - Abnormal; Notable for the following components:      Result Value   WBC 12.4 (*)    RBC 3.48 (*)    Hemoglobin 11.1 (*)    HCT 35.3 (*)    MCV 101.4 (*)    Platelets 97 (*)    Neutro Abs 11.4 (*)    Lymphs Abs 0.4 (*)    All other components within normal limits  COMPREHENSIVE METABOLIC PANEL - Abnormal; Notable for the following components:   Potassium 5.3 (*)    Glucose, Bld 247 (*)    Creatinine, Ser 1.10 (*)    AST 56 (*)    ALT 82 (*)    Alkaline Phosphatase 131 (*)    Total Bilirubin 1.7 (*)    GFR calc non Af Amer 44 (*)    GFR calc Af Amer 51 (*)    All other components within  normal limits  URINALYSIS, ROUTINE W REFLEX MICROSCOPIC - Abnormal; Notable for the following components:   APPearance HAZY (*)    Hgb urine dipstick SMALL (*)    Protein, ur 100 (*)    Bacteria, UA RARE (*)    All other components within normal limits  VITAMIN B12 - Abnormal; Notable for the following components:   Vitamin B-12 156 (*)    All other components within normal limits  HEMOGLOBIN A1C - Abnormal; Notable for the following components:   Hgb A1c MFr Bld 7.1 (*)    All other components within normal limits  GLUCOSE, CAPILLARY - Abnormal; Notable for the following components:   Glucose-Capillary 161 (*)    All other components within normal limits  CBG MONITORING, ED - Abnormal; Notable for the following components:   Glucose-Capillary 234 (*)    All other components within normal limits  CBG MONITORING, ED - Abnormal; Notable for the following components:   Glucose-Capillary 173 (*)    All other components within normal limits  CBG MONITORING, ED - Abnormal; Notable for the following components:   Glucose-Capillary 198 (*)    All other components within normal limits  SARS CORONAVIRUS 2 BY RT PCR (HOSPITAL ORDER, [facility])  MAGNESIUM  PHOSPHORUS  FOLATE  COMPREHENSIVE METABOLIC PANEL  CBC  PROTIME-INR  APTT    EKG EKG Interpretation  Date/Time:  Monday June 23 2020 00:08:14 EDT Ventricular Rate:  74 PR Interval:    QRS Duration: 137 QT Interval:  425 QTC Calculation: 472 R Axis:   -56 Text Interpretation: Sinus rhythm  Left bundle branch block When compared with ECG of 07/26/2019, No significant change was found Confirmed by Delora Fuel ([id number]) on 06/23/2020 12:12:44 AM   Radiology DG Chest 2 View  Result Date: 06/22/2020 CLINICAL DATA:  Infrahilar abnormality on lumbar spine radiograph. EXAM: CHEST - 2 VIEW COMPARISON:  Lumbar spine radiograph earlier today. Rib radiograph 07/26/2019, chest radiograph 05/27/2019 FINDINGS: Rounded  density projecting in the right retrocardiac infrahilar region, unchanged from lumbar spine radiograph earlier today. No definite correlate on the lateral view. Stable mild cardiomegaly. Aortic atherosclerosis and tortuosity. No focal airspace disease or pneumothorax. No pleural fluid. Bones are diffusely under mineralized. No evidence of acute rib fracture. IMPRESSION: 1. Rounded density in the right retrocardiac infrahilar region, unchanged from lumbar spine radiograph earlier today. This is stable in radiographic appearance from rib radiograph 07/26/2019 and earlier radiographs. This is indeterminate by radiograph, may represent superimposed vascular structures. The possibility of adenopathy or mass are not entirely excluded. Consider further evaluation with chest CT on an elective basis. 2. Stable mild cardiomegaly.  Aortic Atherosclerosis (ICD10-I70.0). Electronically Signed   By: Keith Rake M.D.   On: 06/22/2020 17:30   DG Thoracic Spine 2 View  Result Date: 06/22/2020 CLINICAL DATA:  Fall today with low back pain. L1 fracture on lumbar radiographs. Right infrahilar density on radiograph. EXAM: THORACIC SPINE 2 VIEWS COMPARISON:  Lumbar spine radiographs earlier today. FINDINGS: The L1 compression fracture on lumbar radiographs is only partially included in the field of view. No evidence of additional fracture of the thoracic spine. Normal thoracic vertebral body heights. Normal alignment. Multilevel degenerative endplate spurring. IMPRESSION: No fracture of the thoracic spine. The L1 compression fracture on lumbar radiographs earlier today is only partially included in the field of view. Electronically Signed   By: Keith Rake M.D.   On: 06/22/2020 17:32   DG Lumbar Spine Complete  Result Date: 06/22/2020 CLINICAL DATA:  Fall.  Low back pain. EXAM: LUMBAR SPINE - COMPLETE 4+ VIEW COMPARISON:  Chest x-ray 05/27/2019 FINDINGS: Bones are diffusely demineralized. Inferior endplate compression  fracture at L1 appears acute, resulting in less than 25% loss of height. Facets are well aligned bilaterally. SI joints unremarkable. Right infrahilar density noted on the AP view, indeterminate. IMPRESSION: 1. Inferior endplate compression fracture at L1, resulting in less than 25% loss of height. 2. Right infrahilar density, indeterminate. Lymphadenopathy or mediastinal lesion not excluded. Dedicated two view chest x-ray recommended to further evaluate. Electronically Signed   By: Misty Stanley M.D.   On: 06/22/2020 16:43   DG Hips Bilat W or Wo Pelvis 3-4 Views  Result Date: 06/22/2020 CLINICAL DATA:  Patient fell backwards. EXAM: DG HIP (WITH OR WITHOUT PELVIS) 3-4V BILAT COMPARISON:  None. FINDINGS: Bones are diffusely demineralized. No evidence of fracture. SI joints and symphysis pubis unremarkable. Joint space in the hips is relatively well preserved and symmetric. AP and frog-leg lateral views of the right hip show no evidence for femoral neck fracture. IMPRESSION: Negative. Electronically Signed   By: Misty Stanley M.D.   On: 06/22/2020 16:41    Procedures Procedures (including critical care time)  Medications Ordered in ED Medications  acetaminophen (TYLENOL) tablet 650 mg (has no administration in time range)    Or  acetaminophen (TYLENOL) suppository 650 mg (has no administration in time range)  HYDROcodone-acetaminophen (NORCO/VICODIN) 5-325 MG per tablet 1 tablet (1 tablet Oral Not Given 06/23/20 1653)  atorvastatin (LIPITOR) tablet 10 mg (10 mg Oral Given 06/23/20 0958)  carvedilol (COREG) tablet 25  mg (25 mg Oral Not Given 06/23/20 1648)  insulin aspart (novoLOG) injection 0-15 Units (3 Units Subcutaneous Given 06/23/20 1756)  insulin aspart (novoLOG) injection 0-5 Units (has no administration in time range)  methocarbamol (ROBAXIN) tablet 500 mg (500 mg Oral Not Given 06/23/20 1648)  senna-docusate (Senokot-S) tablet 2 tablet (2 tablets Oral Given 06/23/20 1006)  acetaminophen (TYLENOL)  tablet 650 mg (650 mg Oral Given 06/22/20 1604)  HYDROcodone-acetaminophen (NORCO/VICODIN) 5-325 MG per tablet 0.5 tablet (0.5 tablets Oral Given 06/22/20 2136)    ED Course  I have reviewed the triage vital signs and the nursing notes.  Pertinent labs & imaging results that were available during my care of the patient were reviewed by me and considered in my medical decision making (see chart for details).  Clinical Course as of Jun 23 1940  Sun Jun 22, 2020  0998 I spoke with Neurosurgery Dr. Zada Finders will TLSO    [EH]  2155 Patient got half a tablet of norco and while sleeping was dropping down into the 80s.    [EH]    Clinical Course User Index [EH] Ollen Gross   MDM Rules/Calculators/A&P                         Patient is a 86 year old woman who presents today for evaluation after a reported mechanical fall at home.  Patient lives at home alone, she does have family members who visit frequently.  She denies striking her head or passing out.  She does not take any blood thinning medications.  X-rays of bilateral hips, and L-spine were obtained showing concern for a abnormality in her chest, along with a L1 compression fracture.  After this added on x-rays of T-spine and chest.  No additional fractures noted.  Chest x-ray recommends elective/outpatient CT scan on chest for additional evaluation.  I spoke with Dr. Venetia Constable of neurosurgery, recommended a TLSO brace.  This is ordered.  All this was on patient was noted to be hypoxic on room air down into the 80s.  I loosened the brace and then her oxygen returned to her normal baseline.  I suspect that this was due to a combination of her laying in bed and having the brace on tight around her stomach inhibiting her movement of her diaphragm.  Recommend only wearing the brace when out of bed, this does not appear to be a unstable fracture with under 25% height loss.  Given that patient lives at home alone, now with a back fracture  I do not think it is safe for her to be discharged home.  Labs and EKG were ordered for admission, I spoke with hospitalist who will see patient for admission.  Labs had not  Resulted by the time hospitalist assumed care of patient.   Note: Portions of this report may have been transcribed using voice recognition software. Every effort was made to ensure accuracy; however, inadvertent computerized transcription errors may be present  Final Clinical Impression(s) / ED Diagnoses Final diagnoses:  Closed compression fracture of body of L1 vertebra ([place])  Fall, initial encounter    Rx / DC Orders ED Discharge Orders    None       Ollen Gross 06/23/20 1946    Sherwood Gambler, MD 06/26/20 [phone]

## 2020-06-23 ENCOUNTER — Other Ambulatory Visit: Payer: Self-pay

## 2020-06-23 DIAGNOSIS — D72829 Elevated white blood cell count, unspecified: Secondary | ICD-10-CM

## 2020-06-23 DIAGNOSIS — W19XXXA Unspecified fall, initial encounter: Secondary | ICD-10-CM

## 2020-06-23 DIAGNOSIS — E1165 Type 2 diabetes mellitus with hyperglycemia: Secondary | ICD-10-CM

## 2020-06-23 DIAGNOSIS — I5032 Chronic diastolic (congestive) heart failure: Secondary | ICD-10-CM

## 2020-06-23 DIAGNOSIS — I2510 Atherosclerotic heart disease of native coronary artery without angina pectoris: Secondary | ICD-10-CM | POA: Diagnosis not present

## 2020-06-23 DIAGNOSIS — I10 Essential (primary) hypertension: Secondary | ICD-10-CM

## 2020-06-23 DIAGNOSIS — S32010A Wedge compression fracture of first lumbar vertebra, initial encounter for closed fracture: Secondary | ICD-10-CM | POA: Diagnosis present

## 2020-06-23 DIAGNOSIS — M545 Low back pain: Secondary | ICD-10-CM

## 2020-06-23 DIAGNOSIS — E785 Hyperlipidemia, unspecified: Secondary | ICD-10-CM

## 2020-06-23 DIAGNOSIS — D539 Nutritional anemia, unspecified: Secondary | ICD-10-CM

## 2020-06-23 DIAGNOSIS — Y92009 Unspecified place in unspecified non-institutional (private) residence as the place of occurrence of the external cause: Secondary | ICD-10-CM

## 2020-06-23 DIAGNOSIS — M549 Dorsalgia, unspecified: Secondary | ICD-10-CM

## 2020-06-23 DIAGNOSIS — D696 Thrombocytopenia, unspecified: Secondary | ICD-10-CM

## 2020-06-23 DIAGNOSIS — I447 Left bundle-branch block, unspecified: Secondary | ICD-10-CM

## 2020-06-23 LAB — URINALYSIS, ROUTINE W REFLEX MICROSCOPIC
Bilirubin Urine: NEGATIVE
Glucose, UA: NEGATIVE mg/dL
Ketones, ur: NEGATIVE mg/dL
Leukocytes,Ua: NEGATIVE
Nitrite: NEGATIVE
Protein, ur: 100 mg/dL — AB
Specific Gravity, Urine: 1.017 (ref 1.005–1.030)
pH: 6 (ref 5.0–8.0)

## 2020-06-23 LAB — PHOSPHORUS: Phosphorus: 3 mg/dL (ref 2.5–4.6)

## 2020-06-23 LAB — CBC WITH DIFFERENTIAL/PLATELET
Abs Immature Granulocytes: 0.06 10*3/uL (ref 0.00–0.07)
Basophils Absolute: 0 10*3/uL (ref 0.0–0.1)
Basophils Relative: 0 %
Eosinophils Absolute: 0 10*3/uL (ref 0.0–0.5)
Eosinophils Relative: 0 %
HCT: 35.3 % — ABNORMAL LOW (ref 36.0–46.0)
Hemoglobin: 11.1 g/dL — ABNORMAL LOW (ref 12.0–15.0)
Immature Granulocytes: 1 %
Lymphocytes Relative: 3 %
Lymphs Abs: 0.4 10*3/uL — ABNORMAL LOW (ref 0.7–4.0)
MCH: 31.9 pg (ref 26.0–34.0)
MCHC: 31.4 g/dL (ref 30.0–36.0)
MCV: 101.4 fL — ABNORMAL HIGH (ref 80.0–100.0)
Monocytes Absolute: 0.5 10*3/uL (ref 0.1–1.0)
Monocytes Relative: 4 %
Neutro Abs: 11.4 10*3/uL — ABNORMAL HIGH (ref 1.7–7.7)
Neutrophils Relative %: 92 %
Platelets: 97 10*3/uL — ABNORMAL LOW (ref 150–400)
RBC: 3.48 MIL/uL — ABNORMAL LOW (ref 3.87–5.11)
RDW: 15.5 % (ref 11.5–15.5)
WBC: 12.4 10*3/uL — ABNORMAL HIGH (ref 4.0–10.5)
nRBC: 0 % (ref 0.0–0.2)

## 2020-06-23 LAB — COMPREHENSIVE METABOLIC PANEL
ALT: 82 U/L — ABNORMAL HIGH (ref 0–44)
AST: 56 U/L — ABNORMAL HIGH (ref 15–41)
Albumin: 4.2 g/dL (ref 3.5–5.0)
Alkaline Phosphatase: 131 U/L — ABNORMAL HIGH (ref 38–126)
Anion gap: 10 (ref 5–15)
BUN: 22 mg/dL (ref 8–23)
CO2: 23 mmol/L (ref 22–32)
Calcium: 9.5 mg/dL (ref 8.9–10.3)
Chloride: 102 mmol/L (ref 98–111)
Creatinine, Ser: 1.1 mg/dL — ABNORMAL HIGH (ref 0.44–1.00)
GFR calc Af Amer: 51 mL/min — ABNORMAL LOW (ref >60)
GFR calc non Af Amer: 44 mL/min — ABNORMAL LOW (ref >60)
Glucose, Bld: 247 mg/dL — ABNORMAL HIGH (ref 70–99)
Potassium: 5.3 mmol/L — ABNORMAL HIGH (ref 3.5–5.1)
Sodium: 135 mmol/L (ref 135–145)
Total Bilirubin: 1.7 mg/dL — ABNORMAL HIGH (ref 0.3–1.2)
Total Protein: 7.1 g/dL (ref 6.5–8.1)

## 2020-06-23 LAB — MAGNESIUM: Magnesium: 1.8 mg/dL (ref 1.7–2.4)

## 2020-06-23 LAB — CBG MONITORING, ED
Glucose-Capillary: 173 mg/dL — ABNORMAL HIGH (ref 70–99)
Glucose-Capillary: 198 mg/dL — ABNORMAL HIGH (ref 70–99)

## 2020-06-23 LAB — VITAMIN B12: Vitamin B-12: 156 pg/mL — ABNORMAL LOW (ref 180–914)

## 2020-06-23 LAB — HEMOGLOBIN A1C
Hgb A1c MFr Bld: 7.1 % — ABNORMAL HIGH (ref 4.8–5.6)
Mean Plasma Glucose: 157.07 mg/dL

## 2020-06-23 LAB — FOLATE: Folate: 53.6 ng/mL (ref >5.9)

## 2020-06-23 LAB — GLUCOSE, CAPILLARY: Glucose-Capillary: 161 mg/dL — ABNORMAL HIGH (ref 70–99)

## 2020-06-23 LAB — SARS CORONAVIRUS 2 BY RT PCR (HOSPITAL ORDER, PERFORMED IN ~~LOC~~ HOSPITAL LAB): SARS Coronavirus 2: NEGATIVE

## 2020-06-23 MED ORDER — ACETAMINOPHEN 650 MG RE SUPP: 650.0000 mg | Freq: Four times a day (QID) | RECTAL | Status: DC | PRN

## 2020-06-23 MED ORDER — METHOCARBAMOL 500 MG PO TABS
500.0000 mg | ORAL_TABLET | Freq: Three times a day (TID) | ORAL | Status: DC
  Administered 2020-06-23 – 2020-06-25 (×6): 500 mg via ORAL
  Filled 2020-06-23 (×7): qty 1

## 2020-06-23 MED ORDER — INSULIN ASPART 100 UNIT/ML ~~LOC~~ SOLN: 0.0000 [IU] | Freq: Every day | SUBCUTANEOUS | Status: DC

## 2020-06-23 MED ORDER — SENNOSIDES-DOCUSATE SODIUM 8.6-50 MG PO TABS
2.0000 | ORAL_TABLET | Freq: Two times a day (BID) | ORAL | Status: DC
  Administered 2020-06-23 – 2020-06-25 (×5): 2 via ORAL
  Filled 2020-06-23 (×9): qty 2

## 2020-06-23 MED ORDER — ACETAMINOPHEN 325 MG PO TABS: 650.0000 mg | ORAL_TABLET | Freq: Four times a day (QID) | ORAL | Status: DC | PRN

## 2020-06-23 MED ORDER — ENOXAPARIN SODIUM 30 MG/0.3ML ~~LOC~~ SOLN: 30.0000 mg | SUBCUTANEOUS | Status: DC

## 2020-06-23 MED ORDER — ATORVASTATIN CALCIUM 10 MG PO TABS
10.0000 mg | ORAL_TABLET | Freq: Every day | ORAL | Status: DC
  Administered 2020-06-23 – 2020-06-25 (×3): 10 mg via ORAL
  Filled 2020-06-23 (×3): qty 1

## 2020-06-23 MED ORDER — HYDROCODONE-ACETAMINOPHEN 5-325 MG PO TABS
1.0000 | ORAL_TABLET | Freq: Four times a day (QID) | ORAL | Status: DC | PRN
  Administered 2020-06-23 – 2020-06-25 (×4): 1 via ORAL
  Filled 2020-06-23 (×5): qty 1

## 2020-06-23 MED ORDER — CARVEDILOL 12.5 MG PO TABS
25.0000 mg | ORAL_TABLET | Freq: Two times a day (BID) | ORAL | Status: DC
  Administered 2020-06-23 – 2020-06-25 (×4): 25 mg via ORAL
  Filled 2020-06-23 (×6): qty 2

## 2020-06-23 MED ORDER — INSULIN ASPART 100 UNIT/ML ~~LOC~~ SOLN
0.0000 [IU] | Freq: Three times a day (TID) | SUBCUTANEOUS | Status: DC
  Administered 2020-06-23 – 2020-06-24 (×4): 3 [IU] via SUBCUTANEOUS
  Administered 2020-06-24: 2 [IU] via SUBCUTANEOUS
  Administered 2020-06-25 (×2): 3 [IU] via SUBCUTANEOUS
  Filled 2020-06-23 (×2): qty 1

## 2020-06-23 NOTE — ED Notes (Signed)
PT at bedside.

## 2020-06-23 NOTE — Evaluation (Addendum)
Physical Therapy Evaluation Patient Details Name: Robin Guzman MRN: [id number] DOB: 12-27-1927 Today's Date: 06/23/2020   History of Present Illness  Robin Guzman is a 86 y.o. female with medical history significant for hypertension, hyperlipidemia, type 2 diabetes mellitus, CAD, HFpEF, LBBB, hard of hearing and dementia who presents to the emergency department after sustaining a fall at home.  Daughter at bedside provided history possibly due to patient being hard of hearing.  Per daughter, patient lives alone, but she and her brother live very close by and check on her regularly.  Patient was reported to have lost her balance while walking out of a room in her house, she fell on the floor.  She was unable to bear weight on her feet after the fall, so she crawled to her phone and called his son who activated EMS.  Patient denies loss of consciousness or striking her head, she denies neck pain or blurry vision.  She complained of back pain and denies shooting pain to her legs.  Patient endorsed a skin tear to her left elbow.  She ambulates with a cane at baseline ([address] ALLEY NEILS is a 86 y.o. female with medical history si)    Clinical Impression  The patient required mod-max assistance for transfers and gait today. She ambulated only a few side steps with max assist and required excessive VC's in order to follow directions. The patient required excessive VC's and TC's in order to complete supine to sit transfer. Patient tolerated supine position in bed with legs elevated with daughter present at bedside after therapy. Patient will benefit from continued physical therapy in hospital and recommended venue below to increase strength, balance, endurance for safe ADLs and gait.    Follow Up Recommendations SNF    Equipment Recommendations    None recommended by PT.    Recommendations for Other Services   None recommended by PT.     Precautions / Restrictions Precautions Precautions:  Back Precaution Comments:  (L1 compression fx ) Required Braces or Orthoses: Spinal Brace Spinal Brace: Thoracolumbosacral orthotic Restrictions Weight Bearing Restrictions: No      Mobility  Bed Mobility Overal bed mobility: Needs Assistance Bed Mobility: Rolling;Sidelying to Sit;Supine to Sit;Sit to Supine;Sit to Sidelying Rolling: Min assist Sidelying to sit: Mod assist Supine to sit: Mod assist Sit to supine: Min assist Sit to sidelying: Min assist General bed mobility comments: difficult to instruct based on cognitive status  Transfers Overall transfer level: Needs assistance Equipment used: Rolling walker (2 wheeled) Transfers: Sit to/from Stand;Anterior-Posterior Transfer;Lateral/Scoot Transfers Sit to Stand: Mod assist     Anterior-Posterior transfers: Min assist;Mod assist      Ambulation/Gait Ambulation/Gait assistance: Mod assist Gait Distance (Feet): 3 Feet Assistive device: Rolling walker (2 wheeled) Gait Pattern/deviations: Step-to pattern;Shuffle        Stairs            Wheelchair Mobility    Modified Rankin (Stroke Patients Only)       Balance Overall balance assessment: Needs assistance;History of Falls Sitting-balance support: Bilateral upper extremity supported;Feet supported Sitting balance-Leahy Scale: Good Sitting balance - Comments: used UE for balance Postural control: Other (comment) Standing balance support: Bilateral upper extremity supported Standing balance-Leahy Scale: Poor Standing balance comment:  (pt able to stand for 1 minute before requiring rest)                             Pertinent Vitals/Pain Pain Assessment: Faces  Faces Pain Scale: Hurts little more    Home Living Family/patient expects to be discharged to:: Skilled nursing facility Living Arrangements: Alone               Additional Comments: family helps prn    Prior Function Level of Independence: Independent with assistive  device(s)         Comments:  (did not use AD's at home, but used household items for assis)     Hand Dominance        Extremity/Trunk Assessment        Lower Extremity Assessment Lower Extremity Assessment: Overall WFL for tasks assessed       Communication   Communication: HOH  Cognition Arousal/Alertness: Awake/alert Behavior During Therapy: WFL for tasks assessed/performed Overall Cognitive Status: History of cognitive impairments - at baseline                                 General Comments: dementia      General Comments      Exercises     Assessment/Plan    PT Assessment Patient needs continued PT services  PT Problem List Decreased activity tolerance;Decreased balance;Decreased mobility;Decreased knowledge of use of DME;Decreased knowledge of precautions;Pain       PT Treatment Interventions DME instruction;Functional mobility training;Therapeutic activities;Balance training;Patient/family education;Modalities    PT Goals (Current goals can be found in the Care Plan section)  Acute Rehab PT Goals Patient Stated Goal:  (SNF) PT Goal Formulation: With patient/family Time For Goal Achievement: 07/07/20 Potential to Achieve Goals: Good    Frequency Min 3X/week   Barriers to discharge        Co-evaluation     PT goals addressed during session: Mobility/safety with mobility;Balance;Proper use of DME;Strengthening/ROM         AM-PAC PT "6 Clicks" Mobility  Outcome Measure Help needed turning from your back to your side while in a flat bed without using bedrails?: A Lot Help needed moving from lying on your back to sitting on the side of a flat bed without using bedrails?: A Lot Help needed moving to and from a bed to a chair (including a wheelchair)?: Total Help needed standing up from a chair using your arms (e.g., wheelchair or bedside chair)?: A Lot Help needed to walk in hospital room?: A Lot Help needed climbing 3-5 steps  with a railing? : Total 6 Click Score: 10    End of Session Equipment Utilized During Treatment: Gait belt;Back brace Activity Tolerance: Other (comment) Patient left: in bed;with call bell/phone within reach;with family/visitor present Nurse Communication: Mobility status;Precautions;Weight bearing status PT Visit Diagnosis: Unsteadiness on feet (R26.81);Other abnormalities of gait and mobility (R26.89);Repeated falls (R29.6);History of falling (Z91.81);Pain Pain - part of body:  (back)    Time: 4696-2952 PT Time Calculation (min) (ACUTE ONLY): 21 min   Charges:   PT Evaluation $PT Eval Moderate Complexity: 1 Mod PT Treatments $Therapeutic Activity: 8-22 mins        12:25 PM , 06/23/20 Karlyn Agee, SPT Physical Therapy with [facility] [phone] office   During this treatment session, the therapist was present, participating in and directing the treatment.   12:25 PM, 06/23/20 Lonell Grandchild, MPT Physical Therapist with [facility] 336 [phone] office [phone] mobile phone

## 2020-06-23 NOTE — Progress Notes (Signed)
Patient seen and evaluated, chart reviewed, please see EMR for updated orders. Please see full H&P dictated by admitting physician Dr. Josephine Cables for same date of service.   Brief summary 86 y.o. female with medical history significant for hypertension, hyperlipidemia, type 2 diabetes mellitus, CAD, HFpEF, LBBB, hard of hearing and dementia admitted on 06/23/2020 after a fall with L1 compression fracture  A/p 1)S/p Fall /L1 compression fracture--- admit for pain control, PT eval appreciated recommends SNF rehab -Patient has brace on -High risk for recurrent falls and self injury -We will to discharge home, no safe discharge plan if discharged home - Recurrent falls---PTA pt lived alone and did well until recently when she had a fall and now she has done very poorly, -After recent fall with lumbar fracture patient now has significant limitations with mobility related ADLs- this patient needs to continue to be monitored in the hospital until a SNF bed is obtained as she is not safe to go home with her current physcical limitations  ----Plan of care discussed with patient's daughter at bedside  Dispo:- Awaiting transfer to SNF rehab   Continue routine meds for HTN, dCHF, DM2, and HLD  --Total care time 34 minutes including time spent coordinating care with patient's daughter  Patient seen and evaluated, chart reviewed, please see EMR for updated orders. Please see full H&P dictated by admitting physician Dr. Josephine Cables for same date of service.   Roxan Hockey, MD

## 2020-06-23 NOTE — ED Notes (Signed)
ED TO INPATIENT HANDOFF REPORT  ED Nurse Name and Phone #: [phone]  S Name/Age/Gender Robin Guzman 86 y.o. female Room/Bed: APA03/APA03  Code Status   Code Status: Full Code  Home/SNF/Other Home Patient oriented to: self Is this baseline? Yes   Triage Complete: Triage complete  Chief Complaint Compression fracture of L1 vertebra ([place]) [F57.322G]  Triage Note EMS reports pt was walking out a room in her house and lost her balance and fell on her back.  EMS says pt denies passing out.  Reports sacral pain.  Pt usually ambulates with a cane but now unable to put pressure on the r leg.      Allergies Allergies  Allergen Reactions  . Daypro [Oxaprozin] Shortness Of Breath and Other (See Comments)    weakness  . Advil [Ibuprofen] Other (See Comments)    weakness    Level of Care/Admitting Diagnosis ED Disposition    ED Disposition Condition [facility] Area: [facility] [[id number]]  Level of Care: Med-Surg [16]  Covid Evaluation: Asymptomatic Screening Protocol (No Symptoms)  Diagnosis: Compression fracture of L1 vertebra [facility]) [[id number]]  Admitting Physician: Bernadette Hoit [[id number]]  Attending Physician: Bernadette Hoit [[id number]]       B Medical/Surgery History Past Medical History:  Diagnosis Date  . Arthritis   . CAD (coronary artery disease), native coronary artery    07/27/17 nonobstructive, EF 25-35% by LV gram  . CHF (congestive heart failure) ([place])   . Dementia ([place])   . Diabetes mellitus without complication ([place])   . Dyslipidemia   . Dyspnea   . Dyspnea on exertion   . History of stress test 07/05/2006   High risk scan cardiac cathe was recommended.  [name] Hx of echocardiogram 04/09/2011   EF 55% Mildly hypertrophic left ventricle with normal systolic function, Moderate (grade II) diastolic dysfunction. Elevated left atrial pressure. No significant valvular abnormalities. No pericardial effusion. Mild to moderate pulmonary  arterial hypertension.  . Hypercholesteremia   . Hypertension   . Myocardial infarct ([place])   . Skin cancer    HISTORY OF    Past Surgical History:  Procedure Laterality Date  . ABDOMINAL HYSTERECTOMY    . CATARACT EXTRACTION W/PHACO Right 05/20/2017   Procedure: CATARACT EXTRACTION PHACO AND INTRAOCULAR LENS PLACEMENT RIGHT EYE;  Surgeon: Tonny Branch, MD;  Location: [facility];  Service: Ophthalmology;  Laterality: Right;  CDE: 17.77  . CATARACT EXTRACTION W/PHACO Left 06/21/2017   Procedure: CATARACT EXTRACTION PHACO AND INTRAOCULAR LENS PLACEMENT (IOC);  Surgeon: Tonny Branch, MD;  Location: [facility];  Service: Ophthalmology;  Laterality: Left;  CDE: 8.90  . LEFT HEART CATH AND CORONARY ANGIOGRAPHY N/A 07/27/2017   Procedure: LEFT HEART CATH AND CORONARY ANGIOGRAPHY;  Surgeon: Wellington Hampshire, MD;  Location: [facility];  Service: Cardiovascular;  Laterality: N/A;     A IV Location/Drains/Wounds Patient Lines/Drains/Airways Status    Active Line/Drains/Airways    Name Placement date Placement time Site Days   Peripheral IV 06/22/20 Right;Proximal Forearm 06/22/20  2344  Forearm  1   External Urinary Catheter 06/23/20  0256  --  less than 1   Incision (Closed) 05/20/17 Eye Right 05/20/17  0835   1130   Incision (Closed) 06/21/17 Eye Left 06/21/17  0821   1098          Intake/Output Last 24 hours No intake or output data in the 24 hours ending 06/23/20 1502  Labs/Imaging Results for orders placed or performed during  the hospital encounter of 06/22/20 (from the past 48 hour(s))  CBG monitoring, ED     Status: Abnormal   Collection Time: 06/22/20 10:27 PM  Result Value Ref Range   Glucose-Capillary 234 (H) 70 - 99 mg/dL    Comment: Glucose reference range applies only to samples taken after fasting for at least 8 hours.  CBC with Differential     Status: Abnormal   Collection Time: 06/22/20 11:30 PM  Result Value Ref Range   WBC 12.4 (H) 4.0 - 10.5 K/uL   RBC 3.48 (L) 3.87 -  5.11 MIL/uL   Hemoglobin 11.1 (L) 12.0 - 15.0 g/dL   HCT 35.3 (L) 36 - 46 %   MCV 101.4 (H) 80.0 - 100.0 fL   MCH 31.9 26.0 - 34.0 pg   MCHC 31.4 30.0 - 36.0 g/dL   RDW 15.5 11.5 - 15.5 %   Platelets 97 (L) 150 - 400 K/uL    Comment: SPECIMEN CHECKED FOR CLOTS   nRBC 0.0 0.0 - 0.2 %   Neutrophils Relative % 92 %   Neutro Abs 11.4 (H) 1.7 - 7.7 K/uL   Lymphocytes Relative 3 %   Lymphs Abs 0.4 (L) 0.7 - 4.0 K/uL   Monocytes Relative 4 %   Monocytes Absolute 0.5 0 - 1 K/uL   Eosinophils Relative 0 %   Eosinophils Absolute 0.0 0 - 0 K/uL   Basophils Relative 0 %   Basophils Absolute 0.0 0 - 0 K/uL   Immature Granulocytes 1 %   Abs Immature Granulocytes 0.06 0.00 - 0.07 K/uL    Comment: Performed at [facility], [address], [place], [place] [address]  Comprehensive metabolic panel     Status: Abnormal   Collection Time: 06/22/20 11:30 PM  Result Value Ref Range   Sodium 135 135 - 145 mmol/L   Potassium 5.3 (H) 3.5 - 5.1 mmol/L   Chloride 102 98 - 111 mmol/L   CO2 23 22 - 32 mmol/L   Glucose, Bld 247 (H) 70 - 99 mg/dL    Comment: Glucose reference range applies only to samples taken after fasting for at least 8 hours.   BUN 22 8 - 23 mg/dL   Creatinine, Ser 1.10 (H) 0.44 - 1.00 mg/dL   Calcium 9.5 8.9 - 10.3 mg/dL   Total Protein 7.1 6.5 - 8.1 g/dL   Albumin 4.2 3.5 - 5.0 g/dL   AST 56 (H) 15 - 41 U/L   ALT 82 (H) 0 - 44 U/L   Alkaline Phosphatase 131 (H) 38 - 126 U/L   Total Bilirubin 1.7 (H) 0.3 - 1.2 mg/dL   GFR calc non Af Amer 44 (L) >60 mL/min   GFR calc Af Amer 51 (L) >60 mL/min   Anion gap 10 5 - 15    Comment: Performed at [facility], [address], [place], [place] [address]  SARS Coronavirus 2 by RT PCR (hospital order, performed in [facility] hospital lab) Nasopharyngeal Nasopharyngeal Swab     Status: None   Collection Time: 06/22/20 11:30 PM   Specimen: Nasopharyngeal Swab  Result Value Ref Range   SARS Coronavirus 2 NEGATIVE NEGATIVE    Comment:  (NOTE) SARS-CoV-2 target nucleic acids are NOT DETECTED.  The SARS-CoV-2 RNA is generally detectable in upper and lower respiratory specimens during the acute phase of infection. The lowest concentration of SARS-CoV-2 viral copies this assay can detect is 250 copies / mL. A negative result does not preclude SARS-CoV-2 infection and  should not be used as the sole basis for treatment or other patient management decisions.  A negative result may occur with improper specimen collection / handling, submission of specimen other than nasopharyngeal swab, presence of viral mutation(s) within the areas targeted by this assay, and inadequate number of viral copies (<250 copies / mL). A negative result must be combined with clinical observations, patient history, and epidemiological information.  Fact Sheet for Patients:   StrictlyIdeas.no  Fact Sheet for Healthcare Providers: BankingDealers.co.za  This test is not yet approved or  cleared by the [place] FDA and has been authorized for detection and/or diagnosis of SARS-CoV-2 by FDA under an Emergency Use Authorization (EUA).  This EUA will remain in effect (meaning this test can be used) for the duration of the COVID-19 declaration under Section 564(b)(1) of the Act, 21 U.S.C. section 360bbb-3(b)(1), unless the authorization is terminated or revoked sooner.  Performed at [facility], [address], [place], [place] [address]   Magnesium     Status: None   Collection Time: 06/22/20 11:30 PM  Result Value Ref Range   Magnesium 1.8 1.7 - 2.4 mg/dL    Comment: Performed at [facility], [address], [place], [place] [address]  Phosphorus     Status: None   Collection Time: 06/22/20 11:30 PM  Result Value Ref Range   Phosphorus 3.0 2.5 - 4.6 mg/dL    Comment: Performed at [facility], [address] [place] [address], [place], [place] [address]  Vitamin B12     Status: Abnormal   Collection Time:  06/23/20  5:48 AM  Result Value Ref Range   Vitamin B-12 156 (L) 180 - 914 pg/mL    Comment: (NOTE) This assay is not validated for testing neonatal or myeloproliferative syndrome specimens for Vitamin B12 levels. Performed at [facility], [address] [place], [place] [address]   Folate     Status: None   Collection Time: 06/23/20  5:48 AM  Result Value Ref Range   Folate 53.6 >5.9 ng/mL    Comment: RESULTS CONFIRMED BY MANUAL DILUTION Performed at [facility], [address], [place], [place] [address]   Hemoglobin A1c     Status: Abnormal   Collection Time: 06/23/20  5:48 AM  Result Value Ref Range   Hgb A1c MFr Bld 7.1 (H) 4.8 - 5.6 %    Comment: (NOTE) Pre diabetes:          5.7%-6.4%  Diabetes:              >6.4%  Glycemic control for   <7.0% adults with diabetes    Mean Plasma Glucose 157.07 mg/dL    Comment: Performed at [place] [address] [place], [place] [address]  CBG monitoring, ED     Status: Abnormal   Collection Time: 06/23/20  7:56 AM  Result Value Ref Range   Glucose-Capillary 173 (H) 70 - 99 mg/dL    Comment: Glucose reference range applies only to samples taken after fasting for at least 8 hours.  Urinalysis, Routine w reflex microscopic     Status: Abnormal   Collection Time: 06/23/20  9:50 AM  Result Value Ref Range   Color, Urine YELLOW YELLOW   APPearance HAZY (A) CLEAR   Specific Gravity, Urine 1.017 1.005 - 1.030   pH 6.0 5.0 - 8.0   Glucose, UA NEGATIVE NEGATIVE mg/dL   Hgb urine dipstick SMALL (A) NEGATIVE   Bilirubin Urine NEGATIVE NEGATIVE   Ketones, ur NEGATIVE  NEGATIVE mg/dL   Protein, ur 100 (A) NEGATIVE mg/dL   Nitrite NEGATIVE NEGATIVE   Leukocytes,Ua NEGATIVE NEGATIVE   RBC / HPF 0-5 0 - 5 RBC/hpf   WBC, UA 6-10 0 - 5 WBC/hpf   Bacteria, UA RARE (A) NONE SEEN   Squamous Epithelial / LPF 0-5 0 - 5   Mucus PRESENT     Comment: Performed at [facility], [address], [place], [place] [address]  CBG monitoring,  ED     Status: Abnormal   Collection Time: 06/23/20 11:44 AM  Result Value Ref Range   Glucose-Capillary 198 (H) 70 - 99 mg/dL    Comment: Glucose reference range applies only to samples taken after fasting for at least 8 hours.   DG Chest 2 View  Result Date: 06/22/2020 CLINICAL DATA:  Infrahilar abnormality on lumbar spine radiograph. EXAM: CHEST - 2 VIEW COMPARISON:  Lumbar spine radiograph earlier today. Rib radiograph 07/26/2019, chest radiograph 05/27/2019 FINDINGS: Rounded density projecting in the right retrocardiac infrahilar region, unchanged from lumbar spine radiograph earlier today. No definite correlate on the lateral view. Stable mild cardiomegaly. Aortic atherosclerosis and tortuosity. No focal airspace disease or pneumothorax. No pleural fluid. Bones are diffusely under mineralized. No evidence of acute rib fracture. IMPRESSION: 1. Rounded density in the right retrocardiac infrahilar region, unchanged from lumbar spine radiograph earlier today. This is stable in radiographic appearance from rib radiograph 07/26/2019 and earlier radiographs. This is indeterminate by radiograph, may represent superimposed vascular structures. The possibility of adenopathy or mass are not entirely excluded. Consider further evaluation with chest CT on an elective basis. 2. Stable mild cardiomegaly.  Aortic Atherosclerosis (ICD10-I70.0). Electronically Signed   By: Keith Rake M.D.   On: 06/22/2020 17:30   DG Thoracic Spine 2 View  Result Date: 06/22/2020 CLINICAL DATA:  Fall today with low back pain. L1 fracture on lumbar radiographs. Right infrahilar density on radiograph. EXAM: THORACIC SPINE 2 VIEWS COMPARISON:  Lumbar spine radiographs earlier today. FINDINGS: The L1 compression fracture on lumbar radiographs is only partially included in the field of view. No evidence of additional fracture of the thoracic spine. Normal thoracic vertebral body heights. Normal alignment. Multilevel degenerative  endplate spurring. IMPRESSION: No fracture of the thoracic spine. The L1 compression fracture on lumbar radiographs earlier today is only partially included in the field of view. Electronically Signed   By: Keith Rake M.D.   On: 06/22/2020 17:32   DG Lumbar Spine Complete  Result Date: 06/22/2020 CLINICAL DATA:  Fall.  Low back pain. EXAM: LUMBAR SPINE - COMPLETE 4+ VIEW COMPARISON:  Chest x-ray 05/27/2019 FINDINGS: Bones are diffusely demineralized. Inferior endplate compression fracture at L1 appears acute, resulting in less than 25% loss of height. Facets are well aligned bilaterally. SI joints unremarkable. Right infrahilar density noted on the AP view, indeterminate. IMPRESSION: 1. Inferior endplate compression fracture at L1, resulting in less than 25% loss of height. 2. Right infrahilar density, indeterminate. Lymphadenopathy or mediastinal lesion not excluded. Dedicated two view chest x-ray recommended to further evaluate. Electronically Signed   By: Misty Stanley M.D.   On: 06/22/2020 16:43   DG Hips Bilat W or Wo Pelvis 3-4 Views  Result Date: 06/22/2020 CLINICAL DATA:  Patient fell backwards. EXAM: DG HIP (WITH OR WITHOUT PELVIS) 3-4V BILAT COMPARISON:  None. FINDINGS: Bones are diffusely demineralized. No evidence of fracture. SI joints and symphysis pubis unremarkable. Joint space in the hips is relatively well preserved and symmetric. AP and frog-leg lateral views of  the right hip show no evidence for femoral neck fracture. IMPRESSION: Negative. Electronically Signed   By: Misty Stanley M.D.   On: 06/22/2020 16:41    Pending Labs Unresulted Labs (From admission, onward) Comment          Start     Ordered   06/24/20 0500  Comprehensive metabolic panel  Tomorrow morning,   R        06/23/20 0011   06/24/20 0500  CBC  Tomorrow morning,   R        06/23/20 0011   06/24/20 0500  Protime-INR  Tomorrow morning,   R        06/23/20 0011   06/24/20 0500  APTT  Tomorrow morning,   R         06/23/20 0011          Vitals/Pain Today's Vitals   06/23/20 0655 06/23/20 0804 06/23/20 1030 06/23/20 1302  BP:  (!) 148/70 (!) 131/66 136/67  Pulse:  77 78 78  Resp:   18   Temp:      TempSrc:      SpO2:  90% 92% 91%  Weight:      PainSc: 2        Isolation Precautions No active isolations  Medications Medications  acetaminophen (TYLENOL) tablet 650 mg (has no administration in time range)    Or  acetaminophen (TYLENOL) suppository 650 mg (has no administration in time range)  HYDROcodone-acetaminophen (NORCO/VICODIN) 5-325 MG per tablet 1 tablet (1 tablet Oral Given 06/23/20 0553)  atorvastatin (LIPITOR) tablet 10 mg (10 mg Oral Given 06/23/20 0958)  carvedilol (COREG) tablet 25 mg (25 mg Oral Given 06/23/20 0803)  insulin aspart (novoLOG) injection 0-15 Units (3 Units Subcutaneous Given 06/23/20 1303)  insulin aspart (novoLOG) injection 0-5 Units (has no administration in time range)  methocarbamol (ROBAXIN) tablet 500 mg (500 mg Oral Given 06/23/20 0958)  senna-docusate (Senokot-S) tablet 2 tablet (2 tablets Oral Given 06/23/20 1006)  acetaminophen (TYLENOL) tablet 650 mg (650 mg Oral Given 06/22/20 1604)  HYDROcodone-acetaminophen (NORCO/VICODIN) 5-325 MG per tablet 0.5 tablet (0.5 tablets Oral Given 06/22/20 2136)    Mobility walks with person assist High fall risk   Focused Assessments    R Recommendations: See Admitting Provider Note  Report given to:   Additional Notes:

## 2020-06-23 NOTE — NC FL2 (Deleted)
[place] MEDICAID FL2 LEVEL OF CARE SCREENING TOOL     IDENTIFICATION  Patient Name: Robin Guzman Birthdate: 02/27/1928 Sex: female Admission Date (Current Location): 06/22/2020  [facility] and [place] Number:  [employer] and Address:  [facility] [address] [place]      Provider Number:    Attending Physician Name and Address:  Roxan Hockey, MD  Relative Name and Phone Number:  Alberteen Sam      [phone]    Current Level of Care: Hospital Recommended Level of Care: [place] Prior Approval Number:    Date Approved/Denied:   PASRR Number:    Discharge Plan: SNF    Current Diagnoses: Patient Active Problem List   Diagnosis Date Noted  . Compression fracture of L1 vertebra (HCC) 06/23/2020  . Leukocytosis 06/23/2020  . Macrocytic anemia 06/23/2020  . Hyperglycemia due to diabetes mellitus ([place]) 06/23/2020  . Thrombocytopenia ([place]) 06/23/2020  . Back pain 06/23/2020  . Fall at home, initial encounter 06/23/2020  . SVT (supraventricular tachycardia) ([place]) 10/23/2018  . PAT (paroxysmal atrial tachycardia) ([place]) 04/23/2018  . LBBB (left bundle branch block) 04/23/2018  . Dyslipidemia 04/23/2018  . Atherosclerosis of aorta ([place]) 04/23/2018  . Acute systolic heart failure ([place]) 07/28/2017  . Sundowning 07/28/2017  . NSTEMI (non-ST elevated myocardial infarction) ([place]) 07/26/2017  . Closed 3-part fracture of proximal humerus, left, with routine healing, subsequent encounter 12/02/2016  . UTI (urinary tract infection) 12/25/2013  . Dyspnea 12/25/2013  . Near syncope 12/25/2013  . CAD (coronary artery disease) 12/25/2013  . Chronic diastolic heart failure ([place]) 08/07/2013  . Essential hypertension 08/07/2013  . Controlled type 2 diabetes mellitus without complication, without long-term current use of insulin ([place]) 08/07/2013  . Hypercholesteremia 08/07/2013    Orientation RESPIRATION BLADDER Height &  Weight        Normal External catheter Weight: 147 lb 11.3 oz (67 kg) Height:     BEHAVIORAL SYMPTOMS/MOOD NEUROLOGICAL BOWEL NUTRITION STATUS      Continent Diet (Diet Heart Room service appropriate? Yes; Fluid consistency: Thin)  AMBULATORY STATUS COMMUNICATION OF NEEDS Skin   Limited Assist Verbally Normal                       Personal Care Assistance Level of Assistance  Dressing, Feeding, Bathing Bathing Assistance: Limited assistance Feeding assistance: Limited assistance Dressing Assistance: Maximum assistance     Functional Limitations Info  Sight, Hearing, Speech Sight Info: Adequate Hearing Info: Impaired (hard of hearing) Speech Info: Adequate    SPECIAL CARE FACTORS FREQUENCY  PT (By licensed PT)     PT Frequency: 5x per week              Contractures Contractures Info: Not present    Additional Factors Info  Code Status, Allergies Code Status Info: Full Allergies Info: Daypro, Advil           Current Medications (06/23/2020):  This is the current hospital active medication list Current Facility-Administered Medications  Medication Dose Route Frequency Provider Last Rate Last Admin  . acetaminophen (TYLENOL) tablet 650 mg  650 mg Oral Q6H PRN Adefeso, Oladapo, DO       Or  . acetaminophen (TYLENOL) suppository 650 mg  650 mg Rectal Q6H PRN Adefeso, Oladapo, DO      . atorvastatin (LIPITOR) tablet 10 mg  10 mg Oral Daily Adefeso, Oladapo, DO   10 mg at 06/23/20 0958  . carvedilol (COREG)  tablet 25 mg  25 mg Oral BID WC Adefeso, Oladapo, DO   25 mg at 06/23/20 1648  . HYDROcodone-acetaminophen (NORCO/VICODIN) 5-325 MG per tablet 1 tablet  1 tablet Oral Q6H PRN Adefeso, Oladapo, DO   1 tablet at 06/23/20 1653  . insulin aspart (novoLOG) injection 0-15 Units  0-15 Units Subcutaneous TID WC Adefeso, Oladapo, DO   3 Units at 06/23/20 1303  . insulin aspart (novoLOG) injection 0-5 Units  0-5 Units Subcutaneous QHS Adefeso, Oladapo, DO      .  methocarbamol (ROBAXIN) tablet 500 mg  500 mg Oral TID Roxan Hockey, MD   500 mg at 06/23/20 1648  . senna-docusate (Senokot-S) tablet 2 tablet  2 tablet Oral BID Roxan Hockey, MD   2 tablet at 06/23/20 1006     Discharge Medications: Please see discharge summary for a list of discharge medications.  Relevant Imaging Results:  Relevant Lab Results:   Additional Information    Natasha Bence, LCSW

## 2020-06-23 NOTE — Care Management Obs Status (Signed)
[facility] NOTIFICATION   Patient Details  Name: Robin Guzman MRN: [id number] Date of Birth: 21-May-1928   Medicare Observation Status Notification Given:  Yes (copy was placed in room)    Tommy Medal 06/23/2020, 4:33 PM

## 2020-06-23 NOTE — H&P (Signed)
History and Physical  Robin Guzman PNT:[id number] DOB: December 16, 1927 DOA: 06/22/2020  Referring physician: Lorin Glass, PA-C PCP: Celene Squibb, MD  Outpatient Specialists: Cardiology ([place], [facility]) Patient coming from: Home  Chief Complaint: Fall at home  HPI: Robin Guzman is a 86 y.o. female with medical history significant for hypertension, hyperlipidemia, type 2 diabetes mellitus, CAD, HFpEF, LBBB, hard of hearing and dementia who presents to the emergency department after sustaining a fall at home.  Daughter at bedside provided history possibly due to patient being hard of hearing.  Per daughter, patient lives alone, but she and her brother live very close by and check on her regularly.  Patient was reported to have lost her balance while walking out of a room in her house, she fell on the floor.  She was unable to bear weight on her feet after the fall, so she crawled to her phone and called his son who activated EMS.  Patient denies loss of consciousness or striking her head, she denies neck pain or blurry vision.  She complained of back pain and denies shooting pain to her legs.  Patient endorsed a skin tear to her left elbow.  She ambulates with a cane at baseline.  ED Course:  In the emergency department, BP was elevated at 182/86, but other vital signs are within normal range.  Work-up in the ED showed leukocytosis, macrocytic anemia, hyperglycemia, thrombocytopenia and elevated transaminitis.  Lumbar spine x-ray showed inferior endplate compression fracture at L1 resulting in less than 25% loss of height.  No thoracic spine fracture noted.  Continue was given without much improvement in pain, Norco/Vicodin was then given with improvement in pain.  TLSO brace was provided.  Hospitalist was asked to admit patient for further evaluation management.  Review of Systems: Constitutional: Negative for chills and fever.  HENT: Negative for ear pain and sore throat.   Eyes:  Negative for pain and visual disturbance.  Respiratory: Negative for cough, chest tightness and shortness of breath.   Cardiovascular: Negative for chest pain and palpitations.  Gastrointestinal: Negative for abdominal pain and vomiting.  Endocrine: Negative for polyphagia and polyuria.  Genitourinary: Negative for decreased urine volume, dysuria Musculoskeletal: Positive for back pain.  Negative for  neck pain Skin: Positive for left elbow skin tear.  Allergic/Immunologic: Negative for immunocompromised state.  Neurological: Negative for tremors, syncope, speech difficulty, weakness, light-headedness and headaches.  Hematological: Does not bruise/bleed easily.  All other systems reviewed and are negative   Past Medical History:  Diagnosis Date  . Arthritis   . CAD (coronary artery disease), native coronary artery    07/27/17 nonobstructive, EF 25-35% by LV gram  . CHF (congestive heart failure) ([place])   . Dementia ([place])   . Diabetes mellitus without complication ([place])   . Dyslipidemia   . Dyspnea   . Dyspnea on exertion   . History of stress test 07/05/2006   High risk scan cardiac cathe was recommended.  [name] Hx of echocardiogram 04/09/2011   EF 55% Mildly hypertrophic left ventricle with normal systolic function, Moderate (grade II) diastolic dysfunction. Elevated left atrial pressure. No significant valvular abnormalities. No pericardial effusion. Mild to moderate pulmonary arterial hypertension.  . Hypercholesteremia   . Hypertension   . Myocardial infarct ([place])   . Skin cancer    HISTORY OF    Past Surgical History:  Procedure Laterality Date  . ABDOMINAL HYSTERECTOMY    . CATARACT EXTRACTION W/PHACO Right 05/20/2017   Procedure: CATARACT EXTRACTION  PHACO AND INTRAOCULAR LENS PLACEMENT RIGHT EYE;  Surgeon: Tonny Branch, MD;  Location: [facility];  Service: Ophthalmology;  Laterality: Right;  CDE: 17.77  . CATARACT EXTRACTION W/PHACO Left 06/21/2017   Procedure: CATARACT EXTRACTION  PHACO AND INTRAOCULAR LENS PLACEMENT (IOC);  Surgeon: Tonny Branch, MD;  Location: [facility];  Service: Ophthalmology;  Laterality: Left;  CDE: 8.90  . LEFT HEART CATH AND CORONARY ANGIOGRAPHY N/A 07/27/2017   Procedure: LEFT HEART CATH AND CORONARY ANGIOGRAPHY;  Surgeon: Wellington Hampshire, MD;  Location: [place] [facility];  Service: Cardiovascular;  Laterality: N/A;    Social History:  reports that she has never smoked. She has never used smokeless tobacco. She reports that she does not drink alcohol and does not use drugs.   Allergies  Allergen Reactions  . Daypro [Oxaprozin] Shortness Of Breath and Other (See Comments)    weakness  . Advil [Ibuprofen] Other (See Comments)    weakness    Family History  Problem Relation Age of Onset  . Cancer Mother   . Heart Problems Father   . Cancer Brother        brain  . Cancer Sister        brain    Prior to Admission medications   Medication Sig Start Date End Date Taking? Authorizing Provider  aspirin EC 81 MG tablet Take 81 mg by mouth daily.    [provider]  atorvastatin (LIPITOR) 10 MG tablet Take 10 mg by mouth daily.    [provider]  carvedilol (COREG) 25 MG tablet Take 1 tablet (25 mg total) by mouth 2 (two) times daily with a meal. 10/23/18   [place], Mihai, MD  glipiZIDE (GLUCOTROL) 5 MG tablet Take 5 mg by mouth 2 (two) times daily before a meal.     [provider]  metFORMIN (GLUCOPHAGE) 500 MG tablet Take 500 mg by mouth 2 (two) times daily with a meal.    [provider]  Multiple Vitamins-Minerals (CENTRUM ADULTS) TABS Take 1 tablet by mouth daily.    [provider]    Physical Exam: BP (!) 178/74   Pulse 78   Temp 97.9 F (36.6 C) (Oral)   Resp 19   Wt 67 kg   SpO2 93%   BMI 28.85 kg/m   . General: 86 y.o. year-old female well developed well nourished in no acute distress.  Alert and oriented x3. [name] HEENT: NCAT, EOMI . Neck: Supple, trachea  medial . Cardiovascular: Regular rate and rhythm with no rubs or gallops.  No thyromegaly or JVD noted.  No lower extremity edema. 2/4 pulses in all 4 extremities. [name] Respiratory: Clear to auscultation with no wheezes or rales. Good inspiratory effort. . Abdomen: Soft nontender nondistended with normal bowel sounds x4 quadrants. . Muskuloskeletal: Tender to palpation of midsternal low back.  No cyanosis, clubbing or edema noted bilaterally . Neuro: CN II-XII intact, strength, sensation, reflexes . Skin: No ulcerative lesions noted or rashes . Psychiatry: Judgement and insight appear normal. Mood is appropriate for condition and setting          Labs on Admission:  Basic Metabolic Panel: Recent Labs  Lab 06/22/20 2330  NA 135  K 5.3*  CL 102  CO2 23  GLUCOSE 247*  BUN 22  CREATININE 1.10*  CALCIUM 9.5  MG 1.8  PHOS 3.0   Liver Function Tests: Recent Labs  Lab 06/22/20 2330  AST 56*  ALT 82*  ALKPHOS 131*  BILITOT 1.7*  PROT 7.1  ALBUMIN 4.2   No results for input(s): LIPASE, AMYLASE in the last 168 hours. No results for input(s): AMMONIA in the last 168 hours. CBC: Recent Labs  Lab 06/22/20 2330  WBC 12.4*  NEUTROABS 11.4*  HGB 11.1*  HCT 35.3*  MCV 101.4*  PLT 97*   Cardiac Enzymes: No results for input(s): CKTOTAL, CKMB, CKMBINDEX, TROPONINI in the last 168 hours.  BNP (last 3 results) No results for input(s): BNP in the last 8760 hours.  ProBNP (last 3 results) No results for input(s): PROBNP in the last 8760 hours.  CBG: Recent Labs  Lab 06/22/20 2227  GLUCAP 234*    Radiological Exams on Admission: DG Chest 2 View  Result Date: 06/22/2020 CLINICAL DATA:  Infrahilar abnormality on lumbar spine radiograph. EXAM: CHEST - 2 VIEW COMPARISON:  Lumbar spine radiograph earlier today. Rib radiograph 07/26/2019, chest radiograph 05/27/2019 FINDINGS: Rounded density projecting in the right retrocardiac infrahilar region, unchanged from lumbar spine  radiograph earlier today. No definite correlate on the lateral view. Stable mild cardiomegaly. Aortic atherosclerosis and tortuosity. No focal airspace disease or pneumothorax. No pleural fluid. Bones are diffusely under mineralized. No evidence of acute rib fracture. IMPRESSION: 1. Rounded density in the right retrocardiac infrahilar region, unchanged from lumbar spine radiograph earlier today. This is stable in radiographic appearance from rib radiograph 07/26/2019 and earlier radiographs. This is indeterminate by radiograph, may represent superimposed vascular structures. The possibility of adenopathy or mass are not entirely excluded. Consider further evaluation with chest CT on an elective basis. 2. Stable mild cardiomegaly.  Aortic Atherosclerosis (ICD10-I70.0). Electronically Signed   By: Keith Rake M.D.   On: 06/22/2020 17:30   DG Thoracic Spine 2 View  Result Date: 06/22/2020 CLINICAL DATA:  Fall today with low back pain. L1 fracture on lumbar radiographs. Right infrahilar density on radiograph. EXAM: THORACIC SPINE 2 VIEWS COMPARISON:  Lumbar spine radiographs earlier today. FINDINGS: The L1 compression fracture on lumbar radiographs is only partially included in the field of view. No evidence of additional fracture of the thoracic spine. Normal thoracic vertebral body heights. Normal alignment. Multilevel degenerative endplate spurring. IMPRESSION: No fracture of the thoracic spine. The L1 compression fracture on lumbar radiographs earlier today is only partially included in the field of view. Electronically Signed   By: Keith Rake M.D.   On: 06/22/2020 17:32   DG Lumbar Spine Complete  Result Date: 06/22/2020 CLINICAL DATA:  Fall.  Low back pain. EXAM: LUMBAR SPINE - COMPLETE 4+ VIEW COMPARISON:  Chest x-ray 05/27/2019 FINDINGS: Bones are diffusely demineralized. Inferior endplate compression fracture at L1 appears acute, resulting in less than 25% loss of height. Facets are well  aligned bilaterally. SI joints unremarkable. Right infrahilar density noted on the AP view, indeterminate. IMPRESSION: 1. Inferior endplate compression fracture at L1, resulting in less than 25% loss of height. 2. Right infrahilar density, indeterminate. Lymphadenopathy or mediastinal lesion not excluded. Dedicated two view chest x-ray recommended to further evaluate. Electronically Signed   By: Misty Stanley M.D.   On: 06/22/2020 16:43   DG Hips Bilat W or Wo Pelvis 3-4 Views  Result Date: 06/22/2020 CLINICAL DATA:  Patient fell backwards. EXAM: DG HIP (WITH OR WITHOUT PELVIS) 3-4V BILAT COMPARISON:  None. FINDINGS: Bones are diffusely demineralized. No evidence of fracture. SI joints and symphysis pubis unremarkable. Joint space in the hips is relatively well preserved and symmetric. AP and frog-leg lateral views of the right hip show no evidence for femoral neck fracture. IMPRESSION:  Negative. Electronically Signed   By: Misty Stanley M.D.   On: 06/22/2020 16:41    EKG: I independently viewed the EKG done and my findings are as followed: Sinus rhythm at a rate of 74 bpm with left bundle branch block  Assessment/Plan Present on Admission: . Compression fracture of L1 vertebra (HCC) . CAD (coronary artery disease) . Chronic diastolic heart failure ([place]) . Essential hypertension . LBBB (left bundle branch block) . Dyslipidemia  Principal Problem:   Compression fracture of L1 vertebra (HCC) Active Problems:   Chronic diastolic heart failure (HCC)   Essential hypertension   CAD (coronary artery disease)   LBBB (left bundle branch block)   Dyslipidemia   Leukocytosis   Macrocytic anemia   Hyperglycemia due to diabetes mellitus (HCC)   Thrombocytopenia (HCC)   Back pain   Fall at home, initial encounter   Fall at home resulting in compression fracture of L1 vertebra Back pain secondary to above TLSO brace provided Continue Norco/Vicodin 5-325 mg p.o. every 6 hours as needed Continue  fall precaution and neuro checks Continue PT/OT eval and treat  Leukocytosis possibly reactive WBC 12.4, no sign of any acute infective process at this time Continue to monitor WBC with morning labs  Hyperglycemia secondary to Type 2 Diabetes mellitus Continue insulin sliding scale and hypoglycemic protocol Glipizide and Metformin will be held at this time  Thrombocytopenia (chronic) Platelets 97; no sign for any bleeding at this time Continue to monitor platelet level with morning labs  Macrocytic anemia H/H 11.1/35.3, MCV 101.4 Vitamin B12 and folate levels will be checked  Chronic diastolic heart failure/CAD Continue home atorvastatin and Coreg  Essential hypertension Continue Coreg per home regimen  Dyslipidemia Continue home Lipitor per home regimen   DVT prophylaxis: SCDs (no indication for chemo prophylaxis at this time due to thrombocytopenia)  Code Status: Full code (daughter at bedside will discuss with siblings to dobutamine if patient's CODE STATUS needs to be changed).  Family Communication: Daughter at bedside (all questions answered to satisfaction)  Disposition Plan:  Patient is from:                        home Anticipated DC to:                   SNF or family members home Anticipated DC date:                24 hours Anticipated DC barriers:           Unstable to discharge at this time due to compression fracture.  PT/OT eval and treat and recommendation pending  Consults called: None  Admission status: Observation    Bernadette Hoit MD [facility]  If 7PM-7AM, please contact night-coverage [email or web address]  06/23/2020, 4:19 AM

## 2020-06-23 NOTE — NC FL2 (Signed)
[place] MEDICAID FL2 LEVEL OF CARE SCREENING TOOL     IDENTIFICATION  Patient Name: Robin Guzman Birthdate: [birth year] Sex: female Admission Date (Current Location): 06/22/2020  [facility] and [place] Number:  [employer] and Address:  [facility] [address] [place]      Provider Number:    Attending Physician Name and Address:  Roxan Hockey, MD  Relative Name and Phone Number:  Alberteen Sam      [phone]    Current Level of Care: Hospital Recommended Level of Care: [place] Prior Approval Number:    Date Approved/Denied:   PASRR Number: [id number]  Discharge Plan: SNF    Current Diagnoses: Patient Active Problem List   Diagnosis Date Noted  . Compression fracture of L1 vertebra (HCC) 06/23/2020  . Leukocytosis 06/23/2020  . Macrocytic anemia 06/23/2020  . Hyperglycemia due to diabetes mellitus ([place]) 06/23/2020  . Thrombocytopenia ([place]) 06/23/2020  . Back pain 06/23/2020  . Fall at home, initial encounter 06/23/2020  . SVT (supraventricular tachycardia) ([place]) 10/23/2018  . PAT (paroxysmal atrial tachycardia) ([place]) 04/23/2018  . LBBB (left bundle branch block) 04/23/2018  . Dyslipidemia 04/23/2018  . Atherosclerosis of aorta ([place]) 04/23/2018  . Acute systolic heart failure ([place]) 07/28/2017  . Sundowning 07/28/2017  . NSTEMI (non-ST elevated myocardial infarction) ([place]) 07/26/2017  . Closed 3-part fracture of proximal humerus, left, with routine healing, subsequent encounter 12/02/2016  . UTI (urinary tract infection) 12/25/2013  . Dyspnea 12/25/2013  . Near syncope 12/25/2013  . CAD (coronary artery disease) 12/25/2013  . Chronic diastolic heart failure ([place]) 08/07/2013  . Essential hypertension 08/07/2013  . Controlled type 2 diabetes mellitus without complication, without long-term current use of insulin ([place]) 08/07/2013  . Hypercholesteremia 08/07/2013    Orientation RESPIRATION  BLADDER Height & Weight        Normal External catheter Weight: 147 lb 11.3 oz (67 kg) Height:     BEHAVIORAL SYMPTOMS/MOOD NEUROLOGICAL BOWEL NUTRITION STATUS      Continent Diet (Diet Heart Room service appropriate? Yes; Fluid consistency: Thin)  AMBULATORY STATUS COMMUNICATION OF NEEDS Skin   Limited Assist Verbally Normal                       Personal Care Assistance Level of Assistance  Dressing, Feeding, Bathing Bathing Assistance: Limited assistance Feeding assistance: Limited assistance Dressing Assistance: Maximum assistance     Functional Limitations Info  Sight, Hearing, Speech Sight Info: Adequate Hearing Info: Impaired (hard of hearing) Speech Info: Adequate    SPECIAL CARE FACTORS FREQUENCY  PT (By licensed PT)     PT Frequency: 5x per week              Contractures Contractures Info: Not present    Additional Factors Info  Code Status, Allergies Code Status Info: Full Allergies Info: Daypro, Advil           Current Medications (06/23/2020):  This is the current hospital active medication list Current Facility-Administered Medications  Medication Dose Route Frequency Provider Last Rate Last Admin  . acetaminophen (TYLENOL) tablet 650 mg  650 mg Oral Q6H PRN Adefeso, Oladapo, DO       Or  . acetaminophen (TYLENOL) suppository 650 mg  650 mg Rectal Q6H PRN Adefeso, Oladapo, DO      . atorvastatin (LIPITOR) tablet 10 mg  10 mg Oral Daily Adefeso, Oladapo, DO   10 mg at 06/23/20 0958  . carvedilol (COREG) tablet  25 mg  25 mg Oral BID WC Adefeso, Oladapo, DO   25 mg at 06/23/20 0803  . HYDROcodone-acetaminophen (NORCO/VICODIN) 5-325 MG per tablet 1 tablet  1 tablet Oral Q6H PRN Adefeso, Oladapo, DO   1 tablet at 06/23/20 0553  . insulin aspart (novoLOG) injection 0-15 Units  0-15 Units Subcutaneous TID WC Adefeso, Oladapo, DO   3 Units at 06/23/20 1303  . insulin aspart (novoLOG) injection 0-5 Units  0-5 Units Subcutaneous QHS Adefeso, Oladapo, DO       . methocarbamol (ROBAXIN) tablet 500 mg  500 mg Oral TID Roxan Hockey, MD   500 mg at 06/23/20 0958  . senna-docusate (Senokot-S) tablet 2 tablet  2 tablet Oral BID Roxan Hockey, MD   2 tablet at 06/23/20 1006     Discharge Medications: Please see discharge summary for a list of discharge medications.  Relevant Imaging Results:  Relevant Lab Results:   Additional Information    Natasha Bence, LCSW

## 2020-06-23 NOTE — Plan of Care (Addendum)
  Problem: Acute Rehab PT Goals(only PT should resolve) Goal: Pt will Roll Supine to Side Outcome: Progressing Flowsheets (Taken 06/23/2020 1149) Pt will Roll Supine to Side: . with supervision . with modified independence Goal: Pt Will Go Supine/Side To Sit Outcome: Progressing Flowsheets (Taken 06/23/2020 1149) Pt will go Supine/Side to Sit: with moderate assist Goal: Patient Will Transfer Sit To/From Stand Outcome: Progressing Flowsheets (Taken 06/23/2020 1149) Patient will transfer sit to/from stand: with moderate assist Goal: Pt Will Transfer Bed To Chair/Chair To Bed Outcome: Progressing Flowsheets (Taken 06/23/2020 1149) Pt will Transfer Bed to Chair/Chair to Bed: with mod assist Goal: Pt Will Ambulate Outcome: Progressing Flowsheets (Taken 06/23/2020 1149) Pt will Ambulate: . 10 feet . with moderate assist . with rolling walker    11:51 AM , 06/23/20 Karlyn Agee, SPT Physical Therapy with [facility] [phone] office  During this treatment session, the therapist was present, participating in and directing the treatment.  12:27 PM, 06/23/20 Lonell Grandchild, MPT Physical Therapist with [facility] [phone] office [phone] mobile phone

## 2020-06-24 DIAGNOSIS — I2510 Atherosclerotic heart disease of native coronary artery without angina pectoris: Secondary | ICD-10-CM | POA: Diagnosis not present

## 2020-06-24 DIAGNOSIS — S32010A Wedge compression fracture of first lumbar vertebra, initial encounter for closed fracture: Secondary | ICD-10-CM | POA: Diagnosis not present

## 2020-06-24 DIAGNOSIS — I10 Essential (primary) hypertension: Secondary | ICD-10-CM | POA: Diagnosis not present

## 2020-06-24 DIAGNOSIS — W19XXXA Unspecified fall, initial encounter: Secondary | ICD-10-CM | POA: Diagnosis not present

## 2020-06-24 DIAGNOSIS — I5032 Chronic diastolic (congestive) heart failure: Secondary | ICD-10-CM | POA: Diagnosis not present

## 2020-06-24 LAB — CBC
HCT: 34.6 % — ABNORMAL LOW (ref 36.0–46.0)
Hemoglobin: 10.9 g/dL — ABNORMAL LOW (ref 12.0–15.0)
MCH: 31.6 pg (ref 26.0–34.0)
MCHC: 31.5 g/dL (ref 30.0–36.0)
MCV: 100.3 fL — ABNORMAL HIGH (ref 80.0–100.0)
Platelets: 105 10*3/uL — ABNORMAL LOW (ref 150–400)
RBC: 3.45 MIL/uL — ABNORMAL LOW (ref 3.87–5.11)
RDW: 15.6 % — ABNORMAL HIGH (ref 11.5–15.5)
WBC: 12.7 10*3/uL — ABNORMAL HIGH (ref 4.0–10.5)
nRBC: 0 % (ref 0.0–0.2)

## 2020-06-24 LAB — COMPREHENSIVE METABOLIC PANEL
ALT: 55 U/L — ABNORMAL HIGH (ref 0–44)
AST: 34 U/L (ref 15–41)
Albumin: 3.8 g/dL (ref 3.5–5.0)
Alkaline Phosphatase: 82 U/L (ref 38–126)
Anion gap: 10 (ref 5–15)
BUN: 22 mg/dL (ref 8–23)
CO2: 23 mmol/L (ref 22–32)
Calcium: 9.3 mg/dL (ref 8.9–10.3)
Chloride: 102 mmol/L (ref 98–111)
Creatinine, Ser: 1.06 mg/dL — ABNORMAL HIGH (ref 0.44–1.00)
GFR calc Af Amer: 54 mL/min — ABNORMAL LOW (ref >60)
GFR calc non Af Amer: 46 mL/min — ABNORMAL LOW (ref >60)
Glucose, Bld: 188 mg/dL — ABNORMAL HIGH (ref 70–99)
Potassium: 4.6 mmol/L (ref 3.5–5.1)
Sodium: 135 mmol/L (ref 135–145)
Total Bilirubin: 2.6 mg/dL — ABNORMAL HIGH (ref 0.3–1.2)
Total Protein: 6.8 g/dL (ref 6.5–8.1)

## 2020-06-24 LAB — URINALYSIS, ROUTINE W REFLEX MICROSCOPIC
Glucose, UA: NEGATIVE mg/dL
Ketones, ur: NEGATIVE mg/dL
Nitrite: NEGATIVE
Protein, ur: 100 mg/dL — AB
Specific Gravity, Urine: 1.017 (ref 1.005–1.030)
pH: 7 (ref 5.0–8.0)

## 2020-06-24 LAB — GLUCOSE, CAPILLARY
Glucose-Capillary: 134 mg/dL — ABNORMAL HIGH (ref 70–99)
Glucose-Capillary: 163 mg/dL — ABNORMAL HIGH (ref 70–99)
Glucose-Capillary: 163 mg/dL — ABNORMAL HIGH (ref 70–99)
Glucose-Capillary: 180 mg/dL — ABNORMAL HIGH (ref 70–99)

## 2020-06-24 LAB — SARS CORONAVIRUS 2 BY RT PCR (HOSPITAL ORDER, PERFORMED IN ~~LOC~~ HOSPITAL LAB): SARS Coronavirus 2: NEGATIVE

## 2020-06-24 LAB — PROTIME-INR
INR: 1.2 (ref 0.8–1.2)
Prothrombin Time: 14.9 seconds (ref 11.4–15.2)

## 2020-06-24 LAB — APTT: aPTT: 42 seconds — ABNORMAL HIGH (ref 24–36)

## 2020-06-24 MED ORDER — CYANOCOBALAMIN 1000 MCG/ML IJ SOLN
1000.0000 ug | Freq: Once | INTRAMUSCULAR | Status: AC
  Administered 2020-06-24: 1000 ug via INTRAMUSCULAR
  Filled 2020-06-24: qty 1

## 2020-06-24 NOTE — TOC Progression Note (Signed)
Transition of Care [facility]) - Progression Note    Patient Details  Name: Robin Guzman MRN: [id number] Date of Birth: 02/24/1928  Transition of [facility]) CM/SW Contact  Shade Flood, LCSW Phone Number: 06/24/2020, 3:52 PM  Clinical Narrative:     TOC following. Spoke with pt's daughter, [name], to provide bed offers. Pt/family accept offer from [facility]. Updated Mardene Celeste at [facility] They can take pt tomorrow. She will need new COVID test prior to dc. Updated MD.  Updated insurance of pt's selected facility. Awaiting final authorization information.  Assigned TOC will follow up tomorrow.  Expected Discharge Plan: Skilled Nursing Facility Barriers to Discharge: Insurance Authorization  Expected Discharge Plan and Services Expected Discharge Plan: [place]                                               Social Determinants of Health (SDOH) Interventions    Readmission Risk Interventions No flowsheet data found.

## 2020-06-24 NOTE — Evaluation (Signed)
Occupational Therapy Evaluation Patient Details Name: Robin Guzman MRN: [id number] DOB: [birth year] Today's Date: 06/24/2020    History of Present Illness Robin Guzman is a 86 y.o. female with medical history significant for hypertension, hyperlipidemia, type 2 diabetes mellitus, CAD, HFpEF, LBBB, hard of hearing and dementia who presents to the emergency department after sustaining a fall at home.  Daughter at bedside provided history possibly due to patient being hard of hearing.  Per daughter, patient lives alone, but she and her brother live very close by and check on her regularly.  Patient was reported to have lost her balance while walking out of a room in her house, she fell on the floor.  She was unable to bear weight on her feet after the fall, so she crawled to her phone and called his son who activated EMS.  Patient denies loss of consciousness or striking her head, she denies neck pain or blurry vision.  She complained of back pain and denies shooting pain to her legs.  Patient endorsed a skin tear to her left elbow.  She ambulates with a cane at baseline   Clinical Impression   Pt agreeable to OT evaluation, daughter present and providing PLOF information. PTA pt independent with basic ADLs and mobility, family brought meals and checked on pt daily. Pt performing mobility tasks with min guard to min assist today, set-up to max assist required for ADLs due to pain and decreased mobility. Pt is very HOH and benefits from gestures for follow through with tasks. Recommend SNF on discharge to improve pt safety and independence in ADL completion prior to returning home.     Follow Up Recommendations  SNF    Equipment Recommendations  None recommended by OT       Precautions / Restrictions Precautions Precautions: Back Precaution Comments: L1 compression fx Required Braces or Orthoses: Spinal Brace Spinal Brace: Thoracolumbosacral orthotic Restrictions Weight Bearing  Restrictions: No      Mobility Bed Mobility Overal bed mobility: Needs Assistance Bed Mobility: Rolling;Sidelying to Sit Rolling: Min guard Sidelying to sit: Min assist          Transfers Overall transfer level: Needs assistance Equipment used: Rolling walker (2 wheeled) Transfers: Sit to/from [facility] Sit to Stand: Min assist Stand pivot transfers: Min guard;Min assist                ADL either performed or assessed with clinical judgement   ADL Overall ADL's : Needs assistance/impaired                 Upper Body Dressing : Moderate assistance;Sitting Upper Body Dressing Details (indicate cue type and reason): assist for managing gown ties/buttons     Toilet Transfer: Min guard;Minimal assistance;Stand-pivot;RW Toilet Transfer Details (indicate cue type and reason): pt using BSC for toileting Toileting- Clothing Manipulation and Hygiene: Maximal assistance;Sit to/from stand Toileting - Clothing Manipulation Details (indicate cue type and reason): max assist for doffing undergarments, peri-care, and for donning undergarments       General ADL Comments: Pt requiring assistance due to LE/back pain and weakness     Vision Baseline Vision/History: No visual deficits Patient Visual Report: No change from baseline Vision Assessment?: No apparent visual deficits            Pertinent Vitals/Pain Pain Assessment: Faces Faces Pain Scale: Hurts a little bit Pain Location: hips Pain Descriptors / Indicators: Grimacing Pain Intervention(s): Limited activity within patient's tolerance;Monitored during session;Repositioned     Hand  Dominance Right   Extremity/Trunk Assessment Upper Extremity Assessment Upper Extremity Assessment: Generalized weakness (LUE hx of fx)   Lower Extremity Assessment Lower Extremity Assessment: Defer to PT evaluation   Cervical / Trunk Assessment Cervical / Trunk Assessment: Normal   Communication  Communication Communication: HOH   Cognition Arousal/Alertness: Awake/alert Behavior During Therapy: WFL for tasks assessed/performed Overall Cognitive Status: History of cognitive impairments - at baseline                                 General Comments: dementia              Home Living Family/patient expects to be discharged to:: Private residence Living Arrangements: Alone Available Help at Discharge: Family;Available PRN/intermittently Type of Home: House                       Home Equipment: Walker - 2 wheels   Additional Comments: family lives close by and checks on pt daily      Prior Functioning/Environment Level of Independence: Needs assistance  Gait / Transfers Assistance Needed: Did not use DME, furniture walking ADL's / Homemaking Assistance Needed: Independent in basic ADLs, family brings meals             OT Problem List: Decreased strength;Decreased activity tolerance;Impaired balance (sitting and/or standing);Decreased safety awareness;Decreased cognition;Decreased knowledge of use of DME or AE      OT Treatment/Interventions: Self-care/ADL training;Therapeutic exercise;DME and/or AE instruction;Therapeutic activities;Patient/family education    OT Goals(Current goals can be found in the care plan section) Acute Rehab OT Goals Patient Stated Goal: to improve and return home OT Goal Formulation: With patient/family Time For Goal Achievement: 07/08/20 Potential to Achieve Goals: Good  OT Frequency: Min 2X/week   Barriers to D/C: Decreased caregiver support          Co-evaluation PT/OT/SLP Co-Evaluation/Treatment: Yes Reason for Co-Treatment: Complexity of the patient's impairments (multi-system involvement);To address functional/ADL transfers   OT goals addressed during session: ADL's and self-care      AM-PAC OT "6 Clicks" Daily Activity     Outcome Measure Help from another person eating meals?: A Little Help  from another person taking care of personal grooming?: A Little Help from another person toileting, which includes using toliet, bedpan, or urinal?: A Lot Help from another person bathing (including washing, rinsing, drying)?: A Lot Help from another person to put on and taking off regular upper body clothing?: A Little Help from another person to put on and taking off regular lower body clothing?: A Lot 6 Click Score: 15   End of Session Equipment Utilized During Treatment: Rolling walker;Back brace  Activity Tolerance: Patient tolerated treatment well Patient left: in chair;with call bell/phone within reach;with family/visitor present  OT Visit Diagnosis: Repeated falls (R29.6);Muscle weakness (generalized) (M62.81)                Time: 1117-3567 OT Time Calculation (min): 25 min Charges:  OT General Charges $OT Visit: 1 Visit OT Evaluation $OT Eval Low Complexity: [place], OTR/L  [phone] 06/24/2020, 9:38 AM

## 2020-06-24 NOTE — Plan of Care (Signed)
  Problem: Acute Rehab OT Goals (only OT should resolve) Goal: Pt. Will Perform Eating Flowsheets (Taken 06/24/2020 0941) Pt Will Perform Eating:  with modified independence  sitting Goal: Pt. Will Perform Grooming Flowsheets (Taken 06/24/2020 0941) Pt Will Perform Grooming:  with supervision  sitting  standing Goal: Pt. Will Perform Lower Body Dressing Flowsheets (Taken 06/24/2020 0941) Pt Will Perform Lower Body Dressing:  with min assist  sitting/lateral leans  sit to/from stand Goal: Pt. Will Transfer To Toilet Flowsheets (Taken 06/24/2020 0941) Pt Will Transfer to Toilet:  with supervision  ambulating  regular height toilet Goal: Pt. Will Perform Toileting-Clothing Manipulation Flowsheets (Taken 06/24/2020 0941) Pt Will Perform Toileting - Clothing Manipulation and hygiene:  with supervision  sitting/lateral leans  sit to/from stand Goal: Pt/Caregiver Will Perform Home Exercise Program Flowsheets (Taken 06/24/2020 [phone]) Pt/caregiver will Perform Home Exercise Program:  Increased strength  Both right and left upper extremity  Independently  With written HEP provided

## 2020-06-24 NOTE — Progress Notes (Signed)
Physical Therapy Treatment Patient Details Name: Robin Guzman MRN: [id number] DOB: [birth year] Today's Date: 06/24/2020    History of Present Illness Robin Guzman is a 86 y.o. female with medical history significant for hypertension, hyperlipidemia, type 2 diabetes mellitus, CAD, HFpEF, LBBB, hard of hearing and dementia who presents to the emergency department after sustaining a fall at home.  Daughter at bedside provided history possibly due to patient being hard of hearing.  Per daughter, patient lives alone, but she and her brother live very close by and check on her regularly.  Patient was reported to have lost her balance while walking out of a room in her house, she fell on the floor.  She was unable to bear weight on her feet after the fall, so she crawled to her phone and called his son who activated EMS.  Patient denies loss of consciousness or striking her head, she denies neck pain or blurry vision.  She complained of back pain and denies shooting pain to her legs.  Patient endorsed a skin tear to her left elbow.  She ambulates with a cane at baseline    PT Comments    Pt with significant improvement in mobility this session, able to get to EOB and transfer in room with min A. Pt able to perform quad sets with heavy cues, increased low back pain with glute sets so discontinued. Pt very HOH requiring constant cues and daughter to assist with directions. Pt with slight increase in LBP with sitting and mobility, but wishes to remain up in chair with TLSO donned. Recommending RW for steadying and due to BLE weakness to reduce risk for falls. Patient will benefit from continued physical therapy in hospital and recommendations below to increase strength, balance, endurance for safe ADLs and gait.    Follow Up Recommendations  SNF     Equipment Recommendations  Rolling walker with 5" wheels    Recommendations for Other Services       Precautions / Restrictions  Precautions Precautions: Back Precaution Comments: L1 compression fx Required Braces or Orthoses: Spinal Brace Spinal Brace: Thoracolumbosacral orthotic Restrictions Weight Bearing Restrictions: No    Mobility  Bed Mobility Overal bed mobility: Needs Assistance Bed Mobility: Sidelying to Sit Rolling: Min guard Sidelying to sit: Min assist  General bed mobility comments: tactile/verbal cues for log rolling to protect low back, pt able to hold therapists hand and pull trunk to upright position from sidelying  Transfers Overall transfer level: Needs assistance Equipment used: Rolling walker (2 wheeled) Transfers: Sit to/from [facility] Sit to Stand: Min assist Stand pivot transfers: Min guard;Min assist  General transfer comment: min A to fully power up, hip extension after knee extension, initial unsteadiness improving with time; min A to pivot bed to [facility] with cues for hand placement and steadying assist due to BLE weakness, improving to min G with transfer back to bedside then pivot to chair  Ambulation/Gait Ambulation/Gait assistance: Min assist Gait Distance (Feet): 10 Feet Assistive device: Rolling walker (2 wheeled) Gait Pattern/deviations: Step-to pattern;Decreased stride length;Shuffle;Trunk flexed Gait velocity: decreased   General Gait Details: initial difficulty clearing RLE from floor, improving with steps, min A to steady with RW for additional steadying assistance, requiring increased time and cues for upright posture   Stairs             Wheelchair Mobility    Modified Rankin (Stroke Patients Only)       Balance Overall balance assessment: Needs assistance;History of Falls  Sitting-balance support: Feet supported;No upper extremity supported Sitting balance-Leahy Scale: Good Sitting balance - Comments: seated EOB   Standing balance support: During functional activity;Bilateral upper extremity supported Standing balance-Leahy Scale:  Fair Standing balance comment: with RW         Cognition Arousal/Alertness: Awake/alert Behavior During Therapy: WFL for tasks assessed/performed Overall Cognitive Status: History of cognitive impairments - at baseline  General Comments: dementia      Exercises General Exercises - Lower Extremity Quad Sets: Strengthening;Seated;Both;10 reps Gluteal Sets: Strengthening;Seated;Both;5 reps (increased low back pain so discontinued)    General Comments        Pertinent Vitals/Pain Pain Assessment: Faces Faces Pain Scale: Hurts a little bit Pain Location: low back Pain Descriptors / Indicators: Grimacing Pain Intervention(s): Limited activity within patient's tolerance;Monitored during session;Repositioned    Home Living Family/patient expects to be discharged to:: Private residence Living Arrangements: Alone Available Help at Discharge: Family;Available PRN/intermittently Type of Home: House       Home Equipment: Walker - 2 wheels Additional Comments: family lives close by and checks on pt daily    Prior Function Level of Independence: Needs assistance  Gait / Transfers Assistance Needed: Did not use DME, furniture walking ADL's / Homemaking Assistance Needed: Independent in basic ADLs, family brings meals      PT Goals (current goals can now be found in the care plan section) Acute Rehab PT Goals Patient Stated Goal: to improve and return home PT Goal Formulation: With patient/family Time For Goal Achievement: 07/07/20 Potential to Achieve Goals: Good Progress towards PT goals: Progressing toward goals    Frequency    Min 3X/week      PT Plan Current plan remains appropriate    Co-evaluation   Reason for Co-Treatment: Complexity of the patient's impairments (multi-system involvement);To address functional/ADL transfers PT goals addressed during session: Mobility/safety with mobility;Balance OT goals addressed during session: ADL's and self-care       AM-PAC PT "6 Clicks" Mobility   Outcome Measure  Help needed turning from your back to your side while in a flat bed without using bedrails?: A Lot Help needed moving from lying on your back to sitting on the side of a flat bed without using bedrails?: A Lot Help needed moving to and from a bed to a chair (including a wheelchair)?: A Little Help needed standing up from a chair using your arms (e.g., wheelchair or bedside chair)?: A Little Help needed to walk in hospital room?: A Little Help needed climbing 3-5 steps with a railing? : A Lot 6 Click Score: 15    End of Session Equipment Utilized During Treatment: Back brace Activity Tolerance: Patient limited by pain Patient left: in chair;with call bell/phone within reach;with family/visitor present Nurse Communication: Mobility status PT Visit Diagnosis: Unsteadiness on feet (R26.81);Other abnormalities of gait and mobility (R26.89);Repeated falls (R29.6);History of falling (Z91.81);Pain Pain - part of body:  (back)     Time: 6283-6629 PT Time Calculation (min) (ACUTE ONLY): 33 min  Charges:  $Therapeutic Exercise: 8-22 mins $Therapeutic Activity: 8-22 mins                      Tori Paije Goodhart PT, DPT 06/24/20, 10:25 AM [phone]

## 2020-06-24 NOTE — Progress Notes (Addendum)
Patient Demographics:    Robin Guzman, is a 86 y.o. female, DOB - [birth year], HKV:[id number]  Admit date - 06/22/2020   Admitting Physician Bernadette Hoit, DO  Outpatient Primary MD for the patient is Celene Squibb, MD  LOS - 0   Chief Complaint  Patient presents with  . Fall        Subjective:    Robin Guzman today has no fevers, no emesis,  No chest pain,   --c/o back pain --Had confusion and sundowning overnight -Daughter at bedside, questions answered  Assessment  & Plan :    Principal Problem:   Compression fracture of L1 vertebra (HCC) Active Problems:   Chronic diastolic heart failure (HCC)   Essential hypertension   CAD (coronary artery disease)   LBBB (left bundle branch block)   Dyslipidemia   Leukocytosis   Macrocytic anemia   Hyperglycemia due to diabetes mellitus (HCC)   Thrombocytopenia (HCC)   Back pain   Fall at home, initial encounter   Brief summary 86 y.o. female with medical history significant for hypertension, hyperlipidemia, type 2 diabetes mellitus, CAD, HFpEF, LBBB, hard of hearing and dementia admitted on 06/23/2020 after a fall with L1 compression fracture  A/p 1)S/p Fall /L1 compression fracture---  , PT eval appreciated recommends SNF rehab -Patient has brace on -High risk for recurrent falls and self injury -Unable  to discharge home, no safe discharge plan if discharged home - Recurrent falls---PTA pt lived alone and did well until recently when she had a fall and now she has done very poorly, -After recent fall with lumbar fracture patient now has significant limitations with mobility related ADLs- this patient needs to continue to be monitored in the hospital until a SNF bed is obtained as she is not safe to go home with her current physcical limitations  ----Plan of care discussed with patient's daughter at bedside --Judicious opiate use due to  tendency to get confused with opiates -Continue methocarbamol  2)DM2--hold Metformin and glipizide, Use Novolog/Humalog Sliding scale insulin with Accu-Cheks/Fingersticks as ordered   3)HTN/dCHF--- stable, continue Coreg  4)CAD-stable, continue Coreg, continue Lipitor and aspirin  5)Dementia--- continue supportive care, episodes of sundowning consider Haldol or lorazepam as needed   Dispo:- Awaiting transfer to SNF rehab  Disposition/Need for in-Hospital Stay- patient unable to be discharged at this time due to -Dispo:- Awaiting transfer to SNF rehab -After recent fall with lumbar fracture patient now has significant limitations with mobility related ADLs- this patient needs to continue to be monitored in the hospital until a SNF bed is obtained as she is not safe to go home with her current physcical limitations   Disposition: The patient is from: Home              Anticipated d/c is to: SNF              Anticipated d/c date is: 1 day              Patient currently is medically stable to d/c. Barriers: Not Clinically Stable- -Dispo:- Awaiting transfer to SNF rehab -After recent fall with lumbar fracture patient now has significant limitations with mobility related ADLs- this patient needs to continue to be monitored in the hospital until a  SNF bed is obtained as she is not safe to go home with her current physcical limitations   Code Status : full  Family Communication:   Discussed with daughter at bedside  Consults  :  na  DVT Prophylaxis  :  - SCDs *   Lab Results  Component Value Date   PLT 105 (L) 06/24/2020    Inpatient Medications  Scheduled Meds: . atorvastatin  10 mg Oral Daily  . carvedilol  25 mg Oral BID WC  . insulin aspart  0-15 Units Subcutaneous TID WC  . insulin aspart  0-5 Units Subcutaneous QHS  . methocarbamol  500 mg Oral TID  . senna-docusate  2 tablet Oral BID   Continuous Infusions: PRN Meds:.acetaminophen **OR** acetaminophen,  HYDROcodone-acetaminophen    Anti-infectives (From admission, onward)   None        Objective:   Vitals:   06/23/20 2042 06/24/20 0021 06/24/20 0101 06/24/20 0455  BP:  119/69    Pulse:  93    Resp:      Temp:  97.9 F (36.6 C)  98.7 F (37.1 C)  TempSrc:    Oral  SpO2: 93% 91%    Weight:      Height:   5\' 4"  (1.626 m)     Wt Readings from Last 3 Encounters:  06/22/20 67 kg  03/17/20 67.7 kg  07/11/19 67.6 kg    No intake or output data in the 24 hours ending 06/24/20 1636   Physical Exam  Gen:- Awake Alert, pleasantly confused overall HEENT:- [place].AT, No sclera icterus Ears- HOH Neck-Supple Neck,No JVD,.  Lungs-  CTAB , fair symmetrical air movement CV- S1, S2 normal, regular Abd-  +ve B.Sounds, Abd Soft, No tenderness,    Extremity/Skin:- No  edema, pedal pulses present  Psych-affect is appropriate, oriented x3 Neuro-generalized weakness no new focal deficits, no tremors MSK-back brace on   Data Review:   Micro Results Recent Results (from the past 240 hour(s))  SARS Coronavirus 2 by RT PCR (hospital order, performed in [facility] hospital lab) Nasopharyngeal Nasopharyngeal Swab     Status: None   Collection Time: 06/22/20 11:30 PM   Specimen: Nasopharyngeal Swab  Result Value Ref Range Status   SARS Coronavirus 2 NEGATIVE NEGATIVE Final    Comment: (NOTE) SARS-CoV-2 target nucleic acids are NOT DETECTED.  The SARS-CoV-2 RNA is generally detectable in upper and lower respiratory specimens during the acute phase of infection. The lowest concentration of SARS-CoV-2 viral copies this assay can detect is 250 copies / mL. A negative result does not preclude SARS-CoV-2 infection and should not be used as the sole basis for treatment or other patient management decisions.  A negative result may occur with improper specimen collection / handling, submission of specimen other than nasopharyngeal swab, presence of viral mutation(s) within the areas targeted  by this assay, and inadequate number of viral copies (<250 copies / mL). A negative result must be combined with clinical observations, patient history, and epidemiological information.  Fact Sheet for Patients:   StrictlyIdeas.no  Fact Sheet for Healthcare Providers: BankingDealers.co.za  This test is not yet approved or  cleared by the [place] FDA and has been authorized for detection and/or diagnosis of SARS-CoV-2 by FDA under an Emergency Use Authorization (EUA).  This EUA will remain in effect (meaning this test can be used) for the duration of the COVID-19 declaration under Section 564(b)(1) of the Act, 21 U.S.C. section 360bbb-3(b)(1), unless the authorization is terminated or  revoked sooner.  Performed at [facility], [address] [place], [place] [address]     Radiology Reports DG Chest 2 View  Result Date: 06/22/2020 CLINICAL DATA:  Infrahilar abnormality on lumbar spine radiograph. EXAM: CHEST - 2 VIEW COMPARISON:  Lumbar spine radiograph earlier today. Rib radiograph 07/26/2019, chest radiograph 05/27/2019 FINDINGS: Rounded density projecting in the right retrocardiac infrahilar region, unchanged from lumbar spine radiograph earlier today. No definite correlate on the lateral view. Stable mild cardiomegaly. Aortic atherosclerosis and tortuosity. No focal airspace disease or pneumothorax. No pleural fluid. Bones are diffusely under mineralized. No evidence of acute rib fracture. IMPRESSION: 1. Rounded density in the right retrocardiac infrahilar region, unchanged from lumbar spine radiograph earlier today. This is stable in radiographic appearance from rib radiograph 07/26/2019 and earlier radiographs. This is indeterminate by radiograph, may represent superimposed vascular structures. The possibility of adenopathy or mass are not entirely excluded. Consider further evaluation with chest CT on an elective basis. 2. Stable mild  cardiomegaly.  Aortic Atherosclerosis (ICD10-I70.0). Electronically Signed   By: Keith Rake M.D.   On: 06/22/2020 17:30   DG Thoracic Spine 2 View  Result Date: 06/22/2020 CLINICAL DATA:  Fall today with low back pain. L1 fracture on lumbar radiographs. Right infrahilar density on radiograph. EXAM: THORACIC SPINE 2 VIEWS COMPARISON:  Lumbar spine radiographs earlier today. FINDINGS: The L1 compression fracture on lumbar radiographs is only partially included in the field of view. No evidence of additional fracture of the thoracic spine. Normal thoracic vertebral body heights. Normal alignment. Multilevel degenerative endplate spurring. IMPRESSION: No fracture of the thoracic spine. The L1 compression fracture on lumbar radiographs earlier today is only partially included in the field of view. Electronically Signed   By: Keith Rake M.D.   On: 06/22/2020 17:32   DG Lumbar Spine Complete  Result Date: 06/22/2020 CLINICAL DATA:  Fall.  Low back pain. EXAM: LUMBAR SPINE - COMPLETE 4+ VIEW COMPARISON:  Chest x-ray 05/27/2019 FINDINGS: Bones are diffusely demineralized. Inferior endplate compression fracture at L1 appears acute, resulting in less than 25% loss of height. Facets are well aligned bilaterally. SI joints unremarkable. Right infrahilar density noted on the AP view, indeterminate. IMPRESSION: 1. Inferior endplate compression fracture at L1, resulting in less than 25% loss of height. 2. Right infrahilar density, indeterminate. Lymphadenopathy or mediastinal lesion not excluded. Dedicated two view chest x-ray recommended to further evaluate. Electronically Signed   By: Misty Stanley M.D.   On: 06/22/2020 16:43   DG Hips Bilat W or Wo Pelvis 3-4 Views  Result Date: 06/22/2020 CLINICAL DATA:  Patient fell backwards. EXAM: DG HIP (WITH OR WITHOUT PELVIS) 3-4V BILAT COMPARISON:  None. FINDINGS: Bones are diffusely demineralized. No evidence of fracture. SI joints and symphysis pubis unremarkable.  Joint space in the hips is relatively well preserved and symmetric. AP and frog-leg lateral views of the right hip show no evidence for femoral neck fracture. IMPRESSION: Negative. Electronically Signed   By: Misty Stanley M.D.   On: 06/22/2020 16:41     CBC Recent Labs  Lab 06/22/20 2330 06/24/20 0426  WBC 12.4* 12.7*  HGB 11.1* 10.9*  HCT 35.3* 34.6*  PLT 97* 105*  MCV 101.4* 100.3*  MCH 31.9 31.6  MCHC 31.4 31.5  RDW 15.5 15.6*  LYMPHSABS 0.4*  --   MONOABS 0.5  --   EOSABS 0.0  --   BASOSABS 0.0  --     Chemistries  Recent Labs  Lab 06/22/20 2330 06/24/20 0426  NA 135 135  K 5.3* 4.6  CL 102 102  CO2 23 23  GLUCOSE 247* 188*  BUN 22 22  CREATININE 1.10* 1.06*  CALCIUM 9.5 9.3  MG 1.8  --   AST 56* 34  ALT 82* 55*  ALKPHOS 131* 82  BILITOT 1.7* 2.6*   ------------------------------------------------------------------------------------------------------------------ No results for input(s): CHOL, HDL, LDLCALC, TRIG, CHOLHDL, LDLDIRECT in the last 72 hours.  Lab Results  Component Value Date   HGBA1C 7.1 (H) 06/23/2020   ------------------------------------------------------------------------------------------------------------------ No results for input(s): TSH, T4TOTAL, T3FREE, THYROIDAB in the last 72 hours.  Invalid input(s): FREET3 ------------------------------------------------------------------------------------------------------------------ Recent Labs    06/23/20 0548  VITAMINB12 156*  FOLATE 53.6    Coagulation profile Recent Labs  Lab 06/24/20 0426  INR 1.2    No results for input(s): DDIMER in the last 72 hours.  Cardiac Enzymes No results for input(s): CKMB, TROPONINI, MYOGLOBIN in the last 168 hours.  Invalid input(s): CK ------------------------------------------------------------------------------------------------------------------    Component Value Date/Time   BNP 59.0 02/09/2018 0952     Roxan Hockey M.D on  06/24/2020 at 4:36 PM  Go to [email or web address] - for contact info  [facility] - Office  [phone]

## 2020-06-25 DIAGNOSIS — E1165 Type 2 diabetes mellitus with hyperglycemia: Secondary | ICD-10-CM | POA: Diagnosis not present

## 2020-06-25 DIAGNOSIS — Z7401 Bed confinement status: Secondary | ICD-10-CM | POA: Diagnosis not present

## 2020-06-25 DIAGNOSIS — R404 Transient alteration of awareness: Secondary | ICD-10-CM | POA: Diagnosis not present

## 2020-06-25 DIAGNOSIS — R2689 Other abnormalities of gait and mobility: Secondary | ICD-10-CM | POA: Diagnosis not present

## 2020-06-25 DIAGNOSIS — I130 Hypertensive heart and chronic kidney disease with heart failure and stage 1 through stage 4 chronic kidney disease, or unspecified chronic kidney disease: Secondary | ICD-10-CM | POA: Diagnosis not present

## 2020-06-25 DIAGNOSIS — Z20822 Contact with and (suspected) exposure to covid-19: Secondary | ICD-10-CM | POA: Diagnosis not present

## 2020-06-25 DIAGNOSIS — I447 Left bundle-branch block, unspecified: Secondary | ICD-10-CM | POA: Diagnosis not present

## 2020-06-25 DIAGNOSIS — E785 Hyperlipidemia, unspecified: Secondary | ICD-10-CM | POA: Diagnosis not present

## 2020-06-25 DIAGNOSIS — E119 Type 2 diabetes mellitus without complications: Secondary | ICD-10-CM | POA: Diagnosis not present

## 2020-06-25 DIAGNOSIS — Z7984 Long term (current) use of oral hypoglycemic drugs: Secondary | ICD-10-CM | POA: Diagnosis not present

## 2020-06-25 DIAGNOSIS — E1122 Type 2 diabetes mellitus with diabetic chronic kidney disease: Secondary | ICD-10-CM | POA: Diagnosis not present

## 2020-06-25 DIAGNOSIS — I110 Hypertensive heart disease with heart failure: Secondary | ICD-10-CM | POA: Diagnosis not present

## 2020-06-25 DIAGNOSIS — I509 Heart failure, unspecified: Secondary | ICD-10-CM | POA: Diagnosis not present

## 2020-06-25 DIAGNOSIS — W19XXXD Unspecified fall, subsequent encounter: Secondary | ICD-10-CM | POA: Diagnosis not present

## 2020-06-25 DIAGNOSIS — Z85828 Personal history of other malignant neoplasm of skin: Secondary | ICD-10-CM | POA: Diagnosis not present

## 2020-06-25 DIAGNOSIS — I5032 Chronic diastolic (congestive) heart failure: Secondary | ICD-10-CM | POA: Diagnosis not present

## 2020-06-25 DIAGNOSIS — I5020 Unspecified systolic (congestive) heart failure: Secondary | ICD-10-CM | POA: Diagnosis not present

## 2020-06-25 DIAGNOSIS — N189 Chronic kidney disease, unspecified: Secondary | ICD-10-CM | POA: Diagnosis not present

## 2020-06-25 DIAGNOSIS — W19XXXA Unspecified fall, initial encounter: Secondary | ICD-10-CM | POA: Diagnosis not present

## 2020-06-25 DIAGNOSIS — I10 Essential (primary) hypertension: Secondary | ICD-10-CM | POA: Diagnosis not present

## 2020-06-25 DIAGNOSIS — S32010A Wedge compression fracture of first lumbar vertebra, initial encounter for closed fracture: Secondary | ICD-10-CM | POA: Diagnosis not present

## 2020-06-25 DIAGNOSIS — M6281 Muscle weakness (generalized): Secondary | ICD-10-CM | POA: Diagnosis not present

## 2020-06-25 DIAGNOSIS — I2510 Atherosclerotic heart disease of native coronary artery without angina pectoris: Secondary | ICD-10-CM | POA: Diagnosis not present

## 2020-06-25 DIAGNOSIS — Z7982 Long term (current) use of aspirin: Secondary | ICD-10-CM | POA: Diagnosis not present

## 2020-06-25 DIAGNOSIS — Z79899 Other long term (current) drug therapy: Secondary | ICD-10-CM | POA: Diagnosis not present

## 2020-06-25 DIAGNOSIS — S32010D Wedge compression fracture of first lumbar vertebra, subsequent encounter for fracture with routine healing: Secondary | ICD-10-CM | POA: Diagnosis not present

## 2020-06-25 LAB — GLUCOSE, CAPILLARY
Glucose-Capillary: 157 mg/dL — ABNORMAL HIGH (ref 70–99)
Glucose-Capillary: 162 mg/dL — ABNORMAL HIGH (ref 70–99)
Glucose-Capillary: 173 mg/dL — ABNORMAL HIGH (ref 70–99)
Glucose-Capillary: 189 mg/dL — ABNORMAL HIGH (ref 70–99)

## 2020-06-25 MED ORDER — ASPIRIN EC 81 MG PO TBEC: 81.0000 mg | DELAYED_RELEASE_TABLET | Freq: Every day | ORAL | 11 refills | Status: AC

## 2020-06-25 MED ORDER — INSULIN ASPART 100 UNIT/ML FLEXPEN: 0.0000 [IU] | PEN_INJECTOR | Freq: Three times a day (TID) | SUBCUTANEOUS | 11 refills | Status: AC

## 2020-06-25 MED ORDER — HYDROCODONE-ACETAMINOPHEN 5-325 MG PO TABS: 0.5000 | ORAL_TABLET | ORAL | 0 refills | Status: DC | PRN

## 2020-06-25 MED ORDER — HALOPERIDOL LACTATE 5 MG/ML IJ SOLN
5.0000 mg | INTRAMUSCULAR | Status: AC
  Administered 2020-06-25: 5 mg via INTRAMUSCULAR
  Filled 2020-06-25: qty 1

## 2020-06-25 MED ORDER — HALOPERIDOL 5 MG PO TABS: 5.0000 mg | ORAL_TABLET | ORAL | Status: AC

## 2020-06-25 MED ORDER — LORAZEPAM 0.5 MG PO TABS
0.5000 mg | ORAL_TABLET | Freq: Two times a day (BID) | ORAL | 0 refills | Status: AC | PRN
Start: 2020-06-25 — End: 2021-06-25

## 2020-06-25 MED ORDER — SENNOSIDES-DOCUSATE SODIUM 8.6-50 MG PO TABS: 2.0000 | ORAL_TABLET | Freq: Two times a day (BID) | ORAL | 2 refills | Status: AC

## 2020-06-25 MED ORDER — POLYETHYLENE GLYCOL 3350 17 G PO PACK: 17.0000 g | PACK | Freq: Every day | ORAL | 0 refills | Status: AC

## 2020-06-25 MED ORDER — METHOCARBAMOL 500 MG PO TABS: 500.0000 mg | ORAL_TABLET | Freq: Three times a day (TID) | ORAL | 0 refills | Status: AC

## 2020-06-25 NOTE — Discharge Instructions (Signed)
1) fall precautions 2)insulin aspart (novoLOG) injection 0-6 Units  0-6 Units Subcutaneous, 3 times daily with meals  CBG < 70: Implement Hypoglycemia Standing Orders and refer to Hypoglycemia Standing Orders sidebar report   CBG 70 - 120: 0 unit CBG 121 - 150: 0 unit  CBG 151 - 200: 0 unit  CBG 201 - 250: 1 units  CBG 251 - 300:  2units  CBG 301 - 350:  3 units   CBG 351 - 400:  5 units  CBG > 400: 6 units 3) Please use oxygen via nasal cannula with humidifier continuously at 2 L/min

## 2020-06-25 NOTE — Discharge Summary (Signed)
Robin Guzman, is a 86 y.o. female  DOB [birth year]  MRN [id number].  Admission date:  06/22/2020  Admitting Physician  Bernadette Hoit, DO  Discharge Date:  06/25/2020   Primary MD  Celene Squibb, MD  Recommendations for primary care physician for things to follow:   1) fall precautions 2)insulin aspart (novoLOG) injection 0-6 Units  0-6 Units Subcutaneous, 3 times daily with meals  CBG < 70: Implement Hypoglycemia Standing Orders and refer to Hypoglycemia Standing Orders sidebar report   CBG 70 - 120: 0 unit CBG 121 - 150: 0 unit  CBG 151 - 200: 0 unit  CBG 201 - 250: 1 units  CBG 251 - 300:  2units  CBG 301 - 350:  3 units   CBG 351 - 400:  5 units  CBG > 400: 6 units 3) Please use oxygen via nasal cannula with humidifier continuously at 2 L/min  Admission Diagnosis  Fall, initial encounter [W19.XXXA] Closed compression fracture of body of L1 vertebra ([place]) [S32.010A] Compression fracture of L1 vertebra ([place]) [S32.010A]   Discharge Diagnosis  Fall, initial encounter [W19.XXXA] Closed compression fracture of body of L1 vertebra (HCC) [S32.010A] Compression fracture of L1 vertebra (HCC) [S32.010A]    Principal Problem:   Compression fracture of L1 vertebra (HCC) Active Problems:   Chronic diastolic heart failure (HCC)   Essential hypertension   CAD (coronary artery disease)   LBBB (left bundle branch block)   Dyslipidemia   Leukocytosis   Macrocytic anemia   Hyperglycemia due to diabetes mellitus (HCC)   Thrombocytopenia (HCC)   Back pain   Fall at home, initial encounter      Past Medical History:  Diagnosis Date  . Arthritis   . CAD (coronary artery disease), native coronary artery    07/27/17 nonobstructive, EF 25-35% by LV gram  . CHF (congestive heart failure) ([place])   . Dementia ([place])   . Diabetes mellitus without complication ([place])   . Dyslipidemia   . Dyspnea   . Dyspnea  on exertion   . History of stress test 07/05/2006   High risk scan cardiac cathe was recommended.  [name] Hx of echocardiogram 04/09/2011   EF 55% Mildly hypertrophic left ventricle with normal systolic function, Moderate (grade II) diastolic dysfunction. Elevated left atrial pressure. No significant valvular abnormalities. No pericardial effusion. Mild to moderate pulmonary arterial hypertension.  . Hypercholesteremia   . Hypertension   . Myocardial infarct ([place])   . Skin cancer    HISTORY OF     Past Surgical History:  Procedure Laterality Date  . ABDOMINAL HYSTERECTOMY    . CATARACT EXTRACTION W/PHACO Right 05/20/2017   Procedure: CATARACT EXTRACTION PHACO AND INTRAOCULAR LENS PLACEMENT RIGHT EYE;  Surgeon: Tonny Branch, MD;  Location: [facility];  Service: Ophthalmology;  Laterality: Right;  CDE: 17.77  . CATARACT EXTRACTION W/PHACO Left 06/21/2017   Procedure: CATARACT EXTRACTION PHACO AND INTRAOCULAR LENS PLACEMENT (I[facility]);  Surgeon: Tonny Branch, MD;  Location: [facility];  Service: Ophthalmology;  Laterality: Left;  CDE:  8.90  . LEFT HEART CATH AND CORONARY ANGIOGRAPHY N/A 07/27/2017   Procedure: LEFT HEART CATH AND CORONARY ANGIOGRAPHY;  Surgeon: Wellington Hampshire, MD;  Location: [facility];  Service: Cardiovascular;  Laterality: N/A;     HPI  from the history and physical done on the day of admission:    Chief Complaint: Fall at home  HPI: Robin Guzman is a 86 y.o. female with medical history significant for hypertension, hyperlipidemia, type 2 diabetes mellitus, CAD, HFpEF, LBBB, hard of hearing and dementia who presents to the emergency department after sustaining a fall at home.  Daughter at bedside provided history possibly due to patient being hard of hearing.  Per daughter, patient lives alone, but she and her brother live very close by and check on her regularly.  Patient was reported to have lost her balance while walking out of a room in her house, she fell on the floor.  She  was unable to bear weight on her feet after the fall, so she crawled to her phone and called his son who activated EMS.  Patient denies loss of consciousness or striking her head, she denies neck pain or blurry vision.  She complained of back pain and denies shooting pain to her legs.  Patient endorsed a skin tear to her left elbow.  She ambulates with a cane at baseline.  ED Course:  In the emergency department, BP was elevated at 182/86, but other vital signs are within normal range.  Work-up in the ED showed leukocytosis, macrocytic anemia, hyperglycemia, thrombocytopenia and elevated transaminitis.  Lumbar spine x-ray showed inferior endplate compression fracture at L1 resulting in less than 25% loss of height.  No thoracic spine fracture noted.  Continue was given without much improvement in pain, Norco/Vicodin was then given with improvement in pain.  TLSO brace was provided.  Hospitalist was asked to admit patient for further evaluation management.     Hospital Course:    Brief summary 86 y.o.femalewith medical history significant forhypertension, hyperlipidemia, type 2 diabetes mellitus, CAD, HFpEF, LBBB, hard of hearing and dementia admitted on 06/23/2020 after a fall with L1 compression fracture  A/p 1)S/p Fall /L1 compression fracture---  , PT eval appreciated recommends SNF rehab -Patient has brace on -High risk for recurrent falls and self injury -Unable  to discharge home, no safe discharge plan if discharged home - Recurrentfalls---PTA pt lived alone and did well until recently when she had a fall and now she has done very poorly, -After recent fall with lumbar fracture patient now has significant limitations with mobility related ADLs-this patient needs to continue to be monitored in the hospital until a SNF bed is obtained as she is not safe to go home with her current physcical limitations  ----Plan of care discussed with patient's daughter at bedside --Judicious opiate  use due to tendency to get confused with opiates -Continue methocarbamol -May use half a tablet of Vicodin/Norco every 4 hours as needed for pain  2)DM2--stable, okay to restart Metformin and glipizide, Use Novolog/Humalog Sliding scale insulin with Accu-Cheks/Fingersticks as ordered   3)HTN/dCHF--- stable, continue Coreg, currently requiring oxygen at 2 L via nasal cannula  4)CAD-stable, continue Coreg, continue Lipitor and aspirin  5)Dementia--- continue supportive care, episodes of sundowning consider  lorazepam as needed  6)Acute hypoxic respiratory failure--- requiring 2 L of oxygen via nasal cannula, ??  Due to splinting in the setting of lumbar fracture on TLSO brace -Attempted to wean patient off oxygen O2 sats  dropped down to 86% on room air patient placed back on 2 L of oxygen O2 saturation now 96%        -Patient has stable preserved EF CHF    Dispo:- transfer to SNF rehab  Disposition/--transfer to SNF rehab  Code Status : full  Family Communication:   Discussed with daughters x 2 at bedside  Consults  :  na   Discharge Condition: stable Follow UP   Contact information for after-discharge care    Destination    [facility] .   Service: Skilled Nursing Contact information: [address] [place] [phone]                   Consults obtained -   Diet and Activity recommendation:  As advised  Discharge Instructions    * Discharge Instructions    Call MD for:  difficulty breathing, headache or visual disturbances   Complete by: As directed    Call MD for:  persistant dizziness or light-headedness   Complete by: As directed    Call MD for:  persistant nausea and vomiting   Complete by: As directed    Call MD for:  severe uncontrolled pain   Complete by: As directed    Call MD for:  temperature >100.4   Complete by: As directed    Diet - low sodium  heart healthy   Complete by: As directed    Discharge instructions   Complete by: As directed    1) fall precautions 2)insulin aspart (novoLOG) injection 0-6 Units  0-6 Units Subcutaneous, 3 times daily with meals  CBG < 70: Implement Hypoglycemia Standing Orders and refer to Hypoglycemia Standing Orders sidebar report   CBG 70 - 120: 0 unit CBG 121 - 150: 0 unit  CBG 151 - 200: 0 unit  CBG 201 - 250: 1 units  CBG 251 - 300:  2units  CBG 301 - 350:  3 units   CBG 351 - 400:  5 units  CBG > 400: 6 units 3) Please use oxygen via nasal cannula with humidifier continuously at 2 L/min   Increase activity slowly   Complete by: As directed         Discharge Medications     Allergies as of 06/25/2020      Reactions   Daypro [oxaprozin] Shortness Of Breath, Other (See Comments)   weakness   Advil [ibuprofen] Other (See Comments)   weakness      Medication List    TAKE these medications   acetaminophen 500 MG tablet Commonly known as: TYLENOL Take 1,000 mg by mouth in the morning and at bedtime.   aspirin EC 81 MG tablet Take 1 tablet (81 mg total) by mouth daily with breakfast. What changed: when to take this   atorvastatin 10 MG tablet Commonly known as: LIPITOR Take 10 mg by mouth daily.   carvedilol 25 MG tablet Commonly known as: COREG Take 1 tablet (25 mg total) by mouth 2 (two) times daily with a meal.   Centrum Adults Tabs Take 1 tablet by mouth daily.   glipiZIDE 5 MG tablet Commonly known as: GLUCOTROL Take 5 mg by mouth 2 (two) times daily before a meal.   HYDROcodone-acetaminophen 5-325 MG tablet Commonly known as: NORCO/VICODIN Take 0.5 tablets by mouth every 4 (four) hours as needed for moderate pain or severe pain.   insulin aspart 100 UNIT/ML FlexPen Commonly known as:  NOVOLOG Inject 0-6 Units into the skin 3 (three) times daily with meals. insulin aspart (novoLOG) injection 0-6 Units 0-6 Units Subcutaneous, 3 times daily with meals CBG < 70:  Implement Hypoglycemia Standing Orders and refer to Hypoglycemia Standing Orders sidebar report  CBG 70 - 120: 0 unit CBG 121 - 150: 0 unit  CBG 151 - 200: 0 unit CBG 201 - 250: 1 units CBG 251 - 300:  2units CBG 301 - 350:  3 units  CBG 351 - 400:  5 units  CBG > 400: 6 units   LORazepam 0.5 MG tablet Commonly known as: Ativan Take 1 tablet (0.5 mg total) by mouth every 12 (twelve) hours as needed for anxiety or sleep.   metFORMIN 500 MG tablet Commonly known as: GLUCOPHAGE Take 500 mg by mouth 2 (two) times daily with a meal.   methocarbamol 500 MG tablet Commonly known as: ROBAXIN Take 1 tablet (500 mg total) by mouth 3 (three) times daily for 10 days.   polyethylene glycol 17 g packet Commonly known as: MiraLax Take 17 g by mouth daily.   senna-docusate 8.6-50 MG tablet Commonly known as: Senokot-S Take 2 tablets by mouth 2 (two) times daily.       Major procedures and Radiology Reports - PLEASE review detailed and final reports for all details, in brief -      DG Chest 2 View  Result Date: 06/22/2020 CLINICAL DATA:  Infrahilar abnormality on lumbar spine radiograph. EXAM: CHEST - 2 VIEW COMPARISON:  Lumbar spine radiograph earlier today. Rib radiograph 07/26/2019, chest radiograph 05/27/2019 FINDINGS: Rounded density projecting in the right retrocardiac infrahilar region, unchanged from lumbar spine radiograph earlier today. No definite correlate on the lateral view. Stable mild cardiomegaly. Aortic atherosclerosis and tortuosity. No focal airspace disease or pneumothorax. No pleural fluid. Bones are diffusely under mineralized. No evidence of acute rib fracture. IMPRESSION: 1. Rounded density in the right retrocardiac infrahilar region, unchanged from lumbar spine radiograph earlier today. This is stable in radiographic appearance from rib radiograph 07/26/2019 and earlier radiographs. This is indeterminate by radiograph, may represent superimposed vascular structures. The  possibility of adenopathy or mass are not entirely excluded. Consider further evaluation with chest CT on an elective basis. 2. Stable mild cardiomegaly.  Aortic Atherosclerosis (ICD10-I70.0). Electronically Signed   By: Keith Rake M.D.   On: 06/22/2020 17:30   DG Thoracic Spine 2 View  Result Date: 06/22/2020 CLINICAL DATA:  Fall today with low back pain. L1 fracture on lumbar radiographs. Right infrahilar density on radiograph. EXAM: THORACIC SPINE 2 VIEWS COMPARISON:  Lumbar spine radiographs earlier today. FINDINGS: The L1 compression fracture on lumbar radiographs is only partially included in the field of view. No evidence of additional fracture of the thoracic spine. Normal thoracic vertebral body heights. Normal alignment. Multilevel degenerative endplate spurring. IMPRESSION: No fracture of the thoracic spine. The L1 compression fracture on lumbar radiographs earlier today is only partially included in the field of view. Electronically Signed   By: Keith Rake M.D.   On: 06/22/2020 17:32   DG Lumbar Spine Complete  Result Date: 06/22/2020 CLINICAL DATA:  Fall.  Low back pain. EXAM: LUMBAR SPINE - COMPLETE 4+ VIEW COMPARISON:  Chest x-ray 05/27/2019 FINDINGS: Bones are diffusely demineralized. Inferior endplate compression fracture at L1 appears acute, resulting in less than 25% loss of height. Facets are well aligned bilaterally. SI joints unremarkable. Right infrahilar density noted on the AP view, indeterminate. IMPRESSION: 1. Inferior endplate compression fracture at L1, resulting  in less than 25% loss of height. 2. Right infrahilar density, indeterminate. Lymphadenopathy or mediastinal lesion not excluded. Dedicated two view chest x-ray recommended to further evaluate. Electronically Signed   By: Misty Stanley M.D.   On: 06/22/2020 16:43   DG Hips Bilat W or Wo Pelvis 3-4 Views  Result Date: 06/22/2020 CLINICAL DATA:  Patient fell backwards. EXAM: DG HIP (WITH OR WITHOUT PELVIS) 3-4V  BILAT COMPARISON:  None. FINDINGS: Bones are diffusely demineralized. No evidence of fracture. SI joints and symphysis pubis unremarkable. Joint space in the hips is relatively well preserved and symmetric. AP and frog-leg lateral views of the right hip show no evidence for femoral neck fracture. IMPRESSION: Negative. Electronically Signed   By: Misty Stanley M.D.   On: 06/22/2020 16:41    Micro Results    Recent Results (from the past 240 hour(s))  SARS Coronavirus 2 by RT PCR (hospital order, performed in [facility] hospital lab) Nasopharyngeal Nasopharyngeal Swab     Status: None   Collection Time: 06/22/20 11:30 PM   Specimen: Nasopharyngeal Swab  Result Value Ref Range Status   SARS Coronavirus 2 NEGATIVE NEGATIVE Final    Comment: (NOTE) SARS-CoV-2 target nucleic acids are NOT DETECTED.  The SARS-CoV-2 RNA is generally detectable in upper and lower respiratory specimens during the acute phase of infection. The lowest concentration of SARS-CoV-2 viral copies this assay can detect is 250 copies / mL. A negative result does not preclude SARS-CoV-2 infection and should not be used as the sole basis for treatment or other patient management decisions.  A negative result may occur with improper specimen collection / handling, submission of specimen other than nasopharyngeal swab, presence of viral mutation(s) within the areas targeted by this assay, and inadequate number of viral copies (<250 copies / mL). A negative result must be combined with clinical observations, patient history, and epidemiological information.  Fact Sheet for Patients:   StrictlyIdeas.no  Fact Sheet for Healthcare Providers: BankingDealers.co.za  This test is not yet approved or  cleared by the [place] FDA and has been authorized for detection and/or diagnosis of SARS-CoV-2 by FDA under an Emergency Use Authorization (EUA).  This EUA will remain in effect  (meaning this test can be used) for the duration of the COVID-19 declaration under Section 564(b)(1) of the Act, 21 U.S.C. section 360bbb-3(b)(1), unless the authorization is terminated or revoked sooner.  Performed at [facility], [address] [place], [place] [address]   Urine Culture     Status: None (Preliminary result)   Collection Time: 06/24/20 10:49 AM   Specimen: Urine, Clean Catch  Result Value Ref Range Status   Specimen Description   Final    URINE, CLEAN CATCH Performed at [facility], [address] [place], [place] [address]    Special Requests   Final    NONE Performed at [facility], [place] [address], [place], [place] [address]    Culture PENDING  Incomplete   Report Status PENDING  Incomplete  SARS Coronavirus 2 by RT PCR (hospital order, performed in [facility] hospital lab) Nasopharyngeal Nasopharyngeal Swab     Status: None   Collection Time: 06/24/20  4:32 PM   Specimen: Nasopharyngeal Swab  Result Value Ref Range Status   SARS Coronavirus 2 NEGATIVE NEGATIVE Final    Comment: (NOTE) SARS-CoV-2 target nucleic acids are NOT DETECTED.  The SARS-CoV-2 RNA is generally detectable in upper and lower respiratory specimens during the acute phase of infection. The lowest  concentration of SARS-CoV-2 viral copies this assay can detect is 250 copies / mL. A negative result does not preclude SARS-CoV-2 infection and should not be used as the sole basis for treatment or other patient management decisions.  A negative result may occur with improper specimen collection / handling, submission of specimen other than nasopharyngeal swab, presence of viral mutation(s) within the areas targeted by this assay, and inadequate number of viral copies (<250 copies / mL). A negative result must be combined with clinical observations, patient history, and epidemiological information.  Fact Sheet for Patients:   StrictlyIdeas.no  Fact Sheet for  Healthcare Providers: BankingDealers.co.za  This test is not yet approved or  cleared by the [place] FDA and has been authorized for detection and/or diagnosis of SARS-CoV-2 by FDA under an Emergency Use Authorization (EUA).  This EUA will remain in effect (meaning this test can be used) for the duration of the COVID-19 declaration under Section 564(b)(1) of the Act, 21 U.S.C. section 360bbb-3(b)(1), unless the authorization is terminated or revoked sooner.  Performed at [facility], [address] [place], [place] [address]        Today   Subjective    Robin Guzman today has no new complaints  Attempted to wean patient off oxygen O2 sats dropped down to 86% on room air patient placed back on 2 L of oxygen O2 saturation now 96%          Patient has been seen and examined prior to discharge   Objective   Blood pressure 116/63, pulse 76, temperature 98.2 F (36.8 C), resp. rate 18, height 5\' 4"  (1.626 m), weight 67 kg, SpO2 98 %.  No intake or output data in the 24 hours ending 06/25/20 1246  Exam Gen:- Awake Alert, pleasantly confused overall HEENT:- .AT, No sclera icterus Ears- HOH Neck-Supple Neck,No JVD,.  Lungs-  CTAB , fair symmetrical air movement CV- S1, S2 normal, regular Abd-  +ve B.Sounds, Abd Soft, No tenderness,    Extremity/Skin:- No  edema, pedal pulses present  Psych-affect is appropriate, intermittent episodes of anxiety and disorientation, underlying cognitive and memory deficits at baseline neuro-generalized weakness no new focal deficits, no tremors MSK-back brace on   Data Review   CBC w Diff:  Lab Results  Component Value Date   WBC 12.7 (H) 06/24/2020   HGB 10.9 (L) 06/24/2020   HCT 34.6 (L) 06/24/2020   PLT 105 (L) 06/24/2020   LYMPHOPCT 3 06/22/2020   MONOPCT 4 06/22/2020   EOSPCT 0 06/22/2020   BASOPCT 0 06/22/2020    CMP:  Lab Results  Component Value Date   NA 135 06/24/2020   NA 142  03/22/2018   K 4.6 06/24/2020   CL 102 06/24/2020   CO2 23 06/24/2020   BUN 22 06/24/2020   BUN 25 03/22/2018   CREATININE 1.06 (H) 06/24/2020   PROT 6.8 06/24/2020   ALBUMIN 3.8 06/24/2020   BILITOT 2.6 (H) 06/24/2020   ALKPHOS 82 06/24/2020   AST 34 06/24/2020   ALT 55 (H) 06/24/2020  .   Total Discharge time is about 33 minutes  Roxan Hockey M.D on 06/25/2020 at 12:46 PM  Go to [email or web address] -  for contact info  [facility] - Office  [phone]

## 2020-06-25 NOTE — Plan of Care (Signed)
  Problem: Education: Goal: Knowledge of General Education information will improve Description: Including pain rating scale, medication(s)/side effects and non-pharmacologic comfort measures 06/25/2020 1034 by Melony Overly, RN Outcome: Adequate for Discharge 06/25/2020 1034 by Melony Overly, RN Outcome: Adequate for Discharge   Problem: Health Behavior/Discharge Planning: Goal: Ability to manage health-related needs will improve 06/25/2020 1034 by Melony Overly, RN Outcome: Adequate for Discharge 06/25/2020 1034 by Melony Overly, RN Outcome: Adequate for Discharge   Problem: Clinical Measurements: Goal: Ability to maintain clinical measurements within normal limits will improve 06/25/2020 1034 by Melony Overly, RN Outcome: Adequate for Discharge 06/25/2020 1034 by Melony Overly, RN Outcome: Adequate for Discharge Goal: Will remain free from infection 06/25/2020 1034 by Melony Overly, RN Outcome: Adequate for Discharge 06/25/2020 1034 by Melony Overly, RN Outcome: Adequate for Discharge Goal: Diagnostic test results will improve 06/25/2020 1034 by Melony Overly, RN Outcome: Adequate for Discharge 06/25/2020 1034 by Melony Overly, RN Outcome: Adequate for Discharge Goal: Respiratory complications will improve 06/25/2020 1034 by Melony Overly, RN Outcome: Adequate for Discharge 06/25/2020 1034 by Melony Overly, RN Outcome: Adequate for Discharge Goal: Cardiovascular complication will be avoided 06/25/2020 1034 by Melony Overly, RN Outcome: Adequate for Discharge 06/25/2020 1034 by Melony Overly, RN Outcome: Adequate for Discharge   Problem: Activity: Goal: Risk for activity intolerance will decrease 06/25/2020 1034 by Melony Overly, RN Outcome: Adequate for Discharge 06/25/2020 1034 by Melony Overly, RN Outcome: Adequate for Discharge   Problem: Nutrition: Goal: Adequate nutrition will be maintained 06/25/2020 1034 by Melony Overly, RN Outcome: Adequate for  Discharge 06/25/2020 1034 by Melony Overly, RN Outcome: Adequate for Discharge   Problem: Coping: Goal: Level of anxiety will decrease 06/25/2020 1034 by Melony Overly, RN Outcome: Adequate for Discharge 06/25/2020 1034 by Melony Overly, RN Outcome: Adequate for Discharge   Problem: Elimination: Goal: Will not experience complications related to bowel motility 06/25/2020 1034 by Melony Overly, RN Outcome: Adequate for Discharge 06/25/2020 1034 by Melony Overly, RN Outcome: Adequate for Discharge Goal: Will not experience complications related to urinary retention 06/25/2020 1034 by Melony Overly, RN Outcome: Adequate for Discharge 06/25/2020 1034 by Melony Overly, RN Outcome: Adequate for Discharge   Problem: Pain Managment: Goal: General experience of comfort will improve 06/25/2020 1034 by Melony Overly, RN Outcome: Adequate for Discharge 06/25/2020 1034 by Melony Overly, RN Outcome: Adequate for Discharge   Problem: Safety: Goal: Ability to remain free from injury will improve 06/25/2020 1034 by Melony Overly, RN Outcome: Adequate for Discharge 06/25/2020 1034 by Melony Overly, RN Outcome: Adequate for Discharge   Problem: Skin Integrity: Goal: Risk for impaired skin integrity will decrease 06/25/2020 1034 by Melony Overly, RN Outcome: Adequate for Discharge 06/25/2020 1034 by Melony Overly, RN Outcome: Adequate for Discharge

## 2020-06-25 NOTE — TOC Transition Note (Signed)
Transition of Care [facility]) - CM/SW Discharge Note   Patient Details  Name: Robin Guzman MRN: [id number] Date of Birth: 1927-12-22  Transition of Care [facility]) CM/SW Contact:  Natasha Bence, LCSW Phone Number: 06/25/2020, 12:44 PM   Clinical Narrative:    CSW notified [facility] of patient's readiness for discharge. CSW has sent discharge summary to facility and set up transport for 2:00pm. Med necessity form has been printed. Nurse to call report. TOC signing off.   Final next level of care: Skilled Nursing Facility Barriers to Discharge: Barriers Resolved   Patient Goals and CMS Choice Patient states their goals for this hospitalization and ongoing recovery are:: Rehab with SNF CMS Medicare.gov Compare Post Acute Care list provided to:: Patient Choice offered to / list presented to : Patient  Discharge Placement   Existing PASRR number confirmed : 06/25/20          Patient chooses bed at: [facility]) Patient to be transferred to facility by: [facility] Name of family member notified: Alberteen Sam Patient and family notified of of transfer: 06/25/20  Discharge Plan and Services                                     Social Determinants of Health (SDOH) Interventions     Readmission Risk Interventions No flowsheet data found.

## 2020-06-26 DIAGNOSIS — E119 Type 2 diabetes mellitus without complications: Secondary | ICD-10-CM | POA: Diagnosis not present

## 2020-06-26 DIAGNOSIS — I10 Essential (primary) hypertension: Secondary | ICD-10-CM | POA: Diagnosis not present

## 2020-06-26 DIAGNOSIS — S32010A Wedge compression fracture of first lumbar vertebra, initial encounter for closed fracture: Secondary | ICD-10-CM | POA: Diagnosis not present

## 2020-06-26 DIAGNOSIS — I2510 Atherosclerotic heart disease of native coronary artery without angina pectoris: Secondary | ICD-10-CM | POA: Diagnosis not present

## 2020-06-26 LAB — URINE CULTURE: Culture: >=100000 — AB

## 2020-07-03 DIAGNOSIS — E1122 Type 2 diabetes mellitus with diabetic chronic kidney disease: Secondary | ICD-10-CM | POA: Diagnosis not present

## 2020-07-03 DIAGNOSIS — I5020 Unspecified systolic (congestive) heart failure: Secondary | ICD-10-CM | POA: Diagnosis not present

## 2020-07-03 DIAGNOSIS — I10 Essential (primary) hypertension: Secondary | ICD-10-CM | POA: Diagnosis not present

## 2020-07-03 DIAGNOSIS — N189 Chronic kidney disease, unspecified: Secondary | ICD-10-CM | POA: Diagnosis not present

## 2020-07-03 DIAGNOSIS — I130 Hypertensive heart and chronic kidney disease with heart failure and stage 1 through stage 4 chronic kidney disease, or unspecified chronic kidney disease: Secondary | ICD-10-CM | POA: Diagnosis not present

## 2020-07-16 DIAGNOSIS — I2510 Atherosclerotic heart disease of native coronary artery without angina pectoris: Secondary | ICD-10-CM | POA: Diagnosis not present

## 2020-07-16 DIAGNOSIS — E1165 Type 2 diabetes mellitus with hyperglycemia: Secondary | ICD-10-CM | POA: Diagnosis not present

## 2020-07-16 DIAGNOSIS — I110 Hypertensive heart disease with heart failure: Secondary | ICD-10-CM | POA: Diagnosis not present

## 2020-07-16 DIAGNOSIS — S32010D Wedge compression fracture of first lumbar vertebra, subsequent encounter for fracture with routine healing: Secondary | ICD-10-CM | POA: Diagnosis not present

## 2020-07-16 DIAGNOSIS — Z7984 Long term (current) use of oral hypoglycemic drugs: Secondary | ICD-10-CM | POA: Diagnosis not present

## 2020-07-16 DIAGNOSIS — M6281 Muscle weakness (generalized): Secondary | ICD-10-CM | POA: Diagnosis not present

## 2020-07-16 DIAGNOSIS — I5032 Chronic diastolic (congestive) heart failure: Secondary | ICD-10-CM | POA: Diagnosis not present

## 2020-07-16 DIAGNOSIS — R2681 Unsteadiness on feet: Secondary | ICD-10-CM | POA: Diagnosis not present

## 2020-07-22 DIAGNOSIS — I5032 Chronic diastolic (congestive) heart failure: Secondary | ICD-10-CM | POA: Diagnosis not present

## 2020-07-22 DIAGNOSIS — E1122 Type 2 diabetes mellitus with diabetic chronic kidney disease: Secondary | ICD-10-CM | POA: Diagnosis not present

## 2020-07-22 DIAGNOSIS — M6281 Muscle weakness (generalized): Secondary | ICD-10-CM | POA: Diagnosis not present

## 2020-07-22 DIAGNOSIS — I110 Hypertensive heart disease with heart failure: Secondary | ICD-10-CM | POA: Diagnosis not present

## 2020-07-22 DIAGNOSIS — H9193 Unspecified hearing loss, bilateral: Secondary | ICD-10-CM | POA: Diagnosis not present

## 2020-07-22 DIAGNOSIS — Z7984 Long term (current) use of oral hypoglycemic drugs: Secondary | ICD-10-CM | POA: Diagnosis not present

## 2020-07-22 DIAGNOSIS — S32010D Wedge compression fracture of first lumbar vertebra, subsequent encounter for fracture with routine healing: Secondary | ICD-10-CM | POA: Diagnosis not present

## 2020-07-22 DIAGNOSIS — R2681 Unsteadiness on feet: Secondary | ICD-10-CM | POA: Diagnosis not present

## 2020-07-22 DIAGNOSIS — I130 Hypertensive heart and chronic kidney disease with heart failure and stage 1 through stage 4 chronic kidney disease, or unspecified chronic kidney disease: Secondary | ICD-10-CM | POA: Diagnosis not present

## 2020-07-22 DIAGNOSIS — I10 Essential (primary) hypertension: Secondary | ICD-10-CM | POA: Diagnosis not present

## 2020-07-22 DIAGNOSIS — N189 Chronic kidney disease, unspecified: Secondary | ICD-10-CM | POA: Diagnosis not present

## 2020-07-22 DIAGNOSIS — E1165 Type 2 diabetes mellitus with hyperglycemia: Secondary | ICD-10-CM | POA: Diagnosis not present

## 2020-07-22 DIAGNOSIS — I2510 Atherosclerotic heart disease of native coronary artery without angina pectoris: Secondary | ICD-10-CM | POA: Diagnosis not present

## 2020-07-23 DIAGNOSIS — I110 Hypertensive heart disease with heart failure: Secondary | ICD-10-CM | POA: Diagnosis not present

## 2020-07-23 DIAGNOSIS — E1165 Type 2 diabetes mellitus with hyperglycemia: Secondary | ICD-10-CM | POA: Diagnosis not present

## 2020-07-23 DIAGNOSIS — I2510 Atherosclerotic heart disease of native coronary artery without angina pectoris: Secondary | ICD-10-CM | POA: Diagnosis not present

## 2020-07-23 DIAGNOSIS — S32010D Wedge compression fracture of first lumbar vertebra, subsequent encounter for fracture with routine healing: Secondary | ICD-10-CM | POA: Diagnosis not present

## 2020-07-23 DIAGNOSIS — Z7984 Long term (current) use of oral hypoglycemic drugs: Secondary | ICD-10-CM | POA: Diagnosis not present

## 2020-07-23 DIAGNOSIS — R2681 Unsteadiness on feet: Secondary | ICD-10-CM | POA: Diagnosis not present

## 2020-07-23 DIAGNOSIS — I5032 Chronic diastolic (congestive) heart failure: Secondary | ICD-10-CM | POA: Diagnosis not present

## 2020-07-23 DIAGNOSIS — M6281 Muscle weakness (generalized): Secondary | ICD-10-CM | POA: Diagnosis not present

## 2020-07-29 DIAGNOSIS — R2681 Unsteadiness on feet: Secondary | ICD-10-CM | POA: Diagnosis not present

## 2020-07-29 DIAGNOSIS — I2510 Atherosclerotic heart disease of native coronary artery without angina pectoris: Secondary | ICD-10-CM | POA: Diagnosis not present

## 2020-07-29 DIAGNOSIS — S32010D Wedge compression fracture of first lumbar vertebra, subsequent encounter for fracture with routine healing: Secondary | ICD-10-CM | POA: Diagnosis not present

## 2020-07-29 DIAGNOSIS — E1165 Type 2 diabetes mellitus with hyperglycemia: Secondary | ICD-10-CM | POA: Diagnosis not present

## 2020-07-29 DIAGNOSIS — Z7984 Long term (current) use of oral hypoglycemic drugs: Secondary | ICD-10-CM | POA: Diagnosis not present

## 2020-07-29 DIAGNOSIS — I110 Hypertensive heart disease with heart failure: Secondary | ICD-10-CM | POA: Diagnosis not present

## 2020-07-29 DIAGNOSIS — I5032 Chronic diastolic (congestive) heart failure: Secondary | ICD-10-CM | POA: Diagnosis not present

## 2020-07-29 DIAGNOSIS — M6281 Muscle weakness (generalized): Secondary | ICD-10-CM | POA: Diagnosis not present

## 2020-07-30 DIAGNOSIS — S32010D Wedge compression fracture of first lumbar vertebra, subsequent encounter for fracture with routine healing: Secondary | ICD-10-CM | POA: Diagnosis not present

## 2020-07-30 DIAGNOSIS — E1165 Type 2 diabetes mellitus with hyperglycemia: Secondary | ICD-10-CM | POA: Diagnosis not present

## 2020-07-30 DIAGNOSIS — I5032 Chronic diastolic (congestive) heart failure: Secondary | ICD-10-CM | POA: Diagnosis not present

## 2020-07-30 DIAGNOSIS — M6281 Muscle weakness (generalized): Secondary | ICD-10-CM | POA: Diagnosis not present

## 2020-07-30 DIAGNOSIS — R2681 Unsteadiness on feet: Secondary | ICD-10-CM | POA: Diagnosis not present

## 2020-07-30 DIAGNOSIS — I2510 Atherosclerotic heart disease of native coronary artery without angina pectoris: Secondary | ICD-10-CM | POA: Diagnosis not present

## 2020-07-30 DIAGNOSIS — I110 Hypertensive heart disease with heart failure: Secondary | ICD-10-CM | POA: Diagnosis not present

## 2020-07-30 DIAGNOSIS — Z7984 Long term (current) use of oral hypoglycemic drugs: Secondary | ICD-10-CM | POA: Diagnosis not present

## 2020-08-04 DIAGNOSIS — R2681 Unsteadiness on feet: Secondary | ICD-10-CM | POA: Diagnosis not present

## 2020-08-04 DIAGNOSIS — I5032 Chronic diastolic (congestive) heart failure: Secondary | ICD-10-CM | POA: Diagnosis not present

## 2020-08-04 DIAGNOSIS — I110 Hypertensive heart disease with heart failure: Secondary | ICD-10-CM | POA: Diagnosis not present

## 2020-08-04 DIAGNOSIS — E1165 Type 2 diabetes mellitus with hyperglycemia: Secondary | ICD-10-CM | POA: Diagnosis not present

## 2020-08-04 DIAGNOSIS — Z7984 Long term (current) use of oral hypoglycemic drugs: Secondary | ICD-10-CM | POA: Diagnosis not present

## 2020-08-04 DIAGNOSIS — M6281 Muscle weakness (generalized): Secondary | ICD-10-CM | POA: Diagnosis not present

## 2020-08-04 DIAGNOSIS — I2510 Atherosclerotic heart disease of native coronary artery without angina pectoris: Secondary | ICD-10-CM | POA: Diagnosis not present

## 2020-08-04 DIAGNOSIS — S32010D Wedge compression fracture of first lumbar vertebra, subsequent encounter for fracture with routine healing: Secondary | ICD-10-CM | POA: Diagnosis not present

## 2020-08-06 DIAGNOSIS — Z7984 Long term (current) use of oral hypoglycemic drugs: Secondary | ICD-10-CM | POA: Diagnosis not present

## 2020-08-06 DIAGNOSIS — E1165 Type 2 diabetes mellitus with hyperglycemia: Secondary | ICD-10-CM | POA: Diagnosis not present

## 2020-08-06 DIAGNOSIS — I5032 Chronic diastolic (congestive) heart failure: Secondary | ICD-10-CM | POA: Diagnosis not present

## 2020-08-06 DIAGNOSIS — I2510 Atherosclerotic heart disease of native coronary artery without angina pectoris: Secondary | ICD-10-CM | POA: Diagnosis not present

## 2020-08-06 DIAGNOSIS — I110 Hypertensive heart disease with heart failure: Secondary | ICD-10-CM | POA: Diagnosis not present

## 2020-08-06 DIAGNOSIS — M6281 Muscle weakness (generalized): Secondary | ICD-10-CM | POA: Diagnosis not present

## 2020-08-06 DIAGNOSIS — R2681 Unsteadiness on feet: Secondary | ICD-10-CM | POA: Diagnosis not present

## 2020-08-06 DIAGNOSIS — S32010D Wedge compression fracture of first lumbar vertebra, subsequent encounter for fracture with routine healing: Secondary | ICD-10-CM | POA: Diagnosis not present

## 2020-08-08 DIAGNOSIS — R531 Weakness: Secondary | ICD-10-CM | POA: Diagnosis not present

## 2020-08-08 DIAGNOSIS — I509 Heart failure, unspecified: Secondary | ICD-10-CM | POA: Diagnosis not present

## 2020-08-08 DIAGNOSIS — S32019A Unspecified fracture of first lumbar vertebra, initial encounter for closed fracture: Secondary | ICD-10-CM | POA: Diagnosis not present

## 2020-08-15 DIAGNOSIS — R2681 Unsteadiness on feet: Secondary | ICD-10-CM | POA: Diagnosis not present

## 2020-08-15 DIAGNOSIS — I5032 Chronic diastolic (congestive) heart failure: Secondary | ICD-10-CM | POA: Diagnosis not present

## 2020-08-15 DIAGNOSIS — I110 Hypertensive heart disease with heart failure: Secondary | ICD-10-CM | POA: Diagnosis not present

## 2020-08-15 DIAGNOSIS — S32010D Wedge compression fracture of first lumbar vertebra, subsequent encounter for fracture with routine healing: Secondary | ICD-10-CM | POA: Diagnosis not present

## 2020-08-15 DIAGNOSIS — Z7984 Long term (current) use of oral hypoglycemic drugs: Secondary | ICD-10-CM | POA: Diagnosis not present

## 2020-08-15 DIAGNOSIS — I2510 Atherosclerotic heart disease of native coronary artery without angina pectoris: Secondary | ICD-10-CM | POA: Diagnosis not present

## 2020-08-15 DIAGNOSIS — M6281 Muscle weakness (generalized): Secondary | ICD-10-CM | POA: Diagnosis not present

## 2020-08-15 DIAGNOSIS — E1165 Type 2 diabetes mellitus with hyperglycemia: Secondary | ICD-10-CM | POA: Diagnosis not present

## 2020-08-21 DIAGNOSIS — I428 Other cardiomyopathies: Secondary | ICD-10-CM | POA: Diagnosis not present

## 2020-08-21 DIAGNOSIS — I2510 Atherosclerotic heart disease of native coronary artery without angina pectoris: Secondary | ICD-10-CM | POA: Diagnosis not present

## 2020-08-21 DIAGNOSIS — E1165 Type 2 diabetes mellitus with hyperglycemia: Secondary | ICD-10-CM | POA: Diagnosis not present

## 2020-08-21 DIAGNOSIS — S32010D Wedge compression fracture of first lumbar vertebra, subsequent encounter for fracture with routine healing: Secondary | ICD-10-CM | POA: Diagnosis not present

## 2020-08-21 DIAGNOSIS — I110 Hypertensive heart disease with heart failure: Secondary | ICD-10-CM | POA: Diagnosis not present

## 2020-08-21 DIAGNOSIS — E875 Hyperkalemia: Secondary | ICD-10-CM | POA: Diagnosis not present

## 2020-08-21 DIAGNOSIS — R2681 Unsteadiness on feet: Secondary | ICD-10-CM | POA: Diagnosis not present

## 2020-08-21 DIAGNOSIS — M170 Bilateral primary osteoarthritis of knee: Secondary | ICD-10-CM | POA: Diagnosis not present

## 2020-08-21 DIAGNOSIS — D509 Iron deficiency anemia, unspecified: Secondary | ICD-10-CM | POA: Diagnosis not present

## 2020-08-21 DIAGNOSIS — Z7984 Long term (current) use of oral hypoglycemic drugs: Secondary | ICD-10-CM | POA: Diagnosis not present

## 2020-08-21 DIAGNOSIS — M6281 Muscle weakness (generalized): Secondary | ICD-10-CM | POA: Diagnosis not present

## 2020-08-21 DIAGNOSIS — E782 Mixed hyperlipidemia: Secondary | ICD-10-CM | POA: Diagnosis not present

## 2020-08-21 DIAGNOSIS — I5032 Chronic diastolic (congestive) heart failure: Secondary | ICD-10-CM | POA: Diagnosis not present

## 2020-08-22 DIAGNOSIS — R2681 Unsteadiness on feet: Secondary | ICD-10-CM | POA: Diagnosis not present

## 2020-08-22 DIAGNOSIS — I5032 Chronic diastolic (congestive) heart failure: Secondary | ICD-10-CM | POA: Diagnosis not present

## 2020-08-22 DIAGNOSIS — E1165 Type 2 diabetes mellitus with hyperglycemia: Secondary | ICD-10-CM | POA: Diagnosis not present

## 2020-08-22 DIAGNOSIS — Z7984 Long term (current) use of oral hypoglycemic drugs: Secondary | ICD-10-CM | POA: Diagnosis not present

## 2020-08-22 DIAGNOSIS — S32010D Wedge compression fracture of first lumbar vertebra, subsequent encounter for fracture with routine healing: Secondary | ICD-10-CM | POA: Diagnosis not present

## 2020-08-22 DIAGNOSIS — I2510 Atherosclerotic heart disease of native coronary artery without angina pectoris: Secondary | ICD-10-CM | POA: Diagnosis not present

## 2020-08-22 DIAGNOSIS — I110 Hypertensive heart disease with heart failure: Secondary | ICD-10-CM | POA: Diagnosis not present

## 2020-08-22 DIAGNOSIS — M6281 Muscle weakness (generalized): Secondary | ICD-10-CM | POA: Diagnosis not present

## 2020-08-26 DIAGNOSIS — N189 Chronic kidney disease, unspecified: Secondary | ICD-10-CM | POA: Diagnosis not present

## 2020-08-26 DIAGNOSIS — H9193 Unspecified hearing loss, bilateral: Secondary | ICD-10-CM | POA: Diagnosis not present

## 2020-08-26 DIAGNOSIS — Z0001 Encounter for general adult medical examination with abnormal findings: Secondary | ICD-10-CM | POA: Diagnosis not present

## 2020-08-26 DIAGNOSIS — I130 Hypertensive heart and chronic kidney disease with heart failure and stage 1 through stage 4 chronic kidney disease, or unspecified chronic kidney disease: Secondary | ICD-10-CM | POA: Diagnosis not present

## 2020-08-26 DIAGNOSIS — E1122 Type 2 diabetes mellitus with diabetic chronic kidney disease: Secondary | ICD-10-CM | POA: Diagnosis not present

## 2020-08-26 DIAGNOSIS — I10 Essential (primary) hypertension: Secondary | ICD-10-CM | POA: Diagnosis not present

## 2020-08-28 DIAGNOSIS — S32010D Wedge compression fracture of first lumbar vertebra, subsequent encounter for fracture with routine healing: Secondary | ICD-10-CM | POA: Diagnosis not present

## 2020-08-28 DIAGNOSIS — I5032 Chronic diastolic (congestive) heart failure: Secondary | ICD-10-CM | POA: Diagnosis not present

## 2020-08-28 DIAGNOSIS — R2681 Unsteadiness on feet: Secondary | ICD-10-CM | POA: Diagnosis not present

## 2020-08-28 DIAGNOSIS — I110 Hypertensive heart disease with heart failure: Secondary | ICD-10-CM | POA: Diagnosis not present

## 2020-08-28 DIAGNOSIS — M6281 Muscle weakness (generalized): Secondary | ICD-10-CM | POA: Diagnosis not present

## 2020-08-28 DIAGNOSIS — I2510 Atherosclerotic heart disease of native coronary artery without angina pectoris: Secondary | ICD-10-CM | POA: Diagnosis not present

## 2020-08-28 DIAGNOSIS — E1165 Type 2 diabetes mellitus with hyperglycemia: Secondary | ICD-10-CM | POA: Diagnosis not present

## 2020-08-28 DIAGNOSIS — Z7984 Long term (current) use of oral hypoglycemic drugs: Secondary | ICD-10-CM | POA: Diagnosis not present

## 2020-09-03 DIAGNOSIS — I110 Hypertensive heart disease with heart failure: Secondary | ICD-10-CM | POA: Diagnosis not present

## 2020-09-03 DIAGNOSIS — I2510 Atherosclerotic heart disease of native coronary artery without angina pectoris: Secondary | ICD-10-CM | POA: Diagnosis not present

## 2020-09-03 DIAGNOSIS — S32010D Wedge compression fracture of first lumbar vertebra, subsequent encounter for fracture with routine healing: Secondary | ICD-10-CM | POA: Diagnosis not present

## 2020-09-03 DIAGNOSIS — M6281 Muscle weakness (generalized): Secondary | ICD-10-CM | POA: Diagnosis not present

## 2020-09-03 DIAGNOSIS — Z7984 Long term (current) use of oral hypoglycemic drugs: Secondary | ICD-10-CM | POA: Diagnosis not present

## 2020-09-03 DIAGNOSIS — I5032 Chronic diastolic (congestive) heart failure: Secondary | ICD-10-CM | POA: Diagnosis not present

## 2020-09-03 DIAGNOSIS — R2681 Unsteadiness on feet: Secondary | ICD-10-CM | POA: Diagnosis not present

## 2020-09-03 DIAGNOSIS — E1165 Type 2 diabetes mellitus with hyperglycemia: Secondary | ICD-10-CM | POA: Diagnosis not present

## 2020-09-05 DIAGNOSIS — R2681 Unsteadiness on feet: Secondary | ICD-10-CM | POA: Diagnosis not present

## 2020-09-05 DIAGNOSIS — E1165 Type 2 diabetes mellitus with hyperglycemia: Secondary | ICD-10-CM | POA: Diagnosis not present

## 2020-09-05 DIAGNOSIS — I110 Hypertensive heart disease with heart failure: Secondary | ICD-10-CM | POA: Diagnosis not present

## 2020-09-05 DIAGNOSIS — I5032 Chronic diastolic (congestive) heart failure: Secondary | ICD-10-CM | POA: Diagnosis not present

## 2020-09-05 DIAGNOSIS — I2510 Atherosclerotic heart disease of native coronary artery without angina pectoris: Secondary | ICD-10-CM | POA: Diagnosis not present

## 2020-09-05 DIAGNOSIS — M6281 Muscle weakness (generalized): Secondary | ICD-10-CM | POA: Diagnosis not present

## 2020-09-05 DIAGNOSIS — Z7984 Long term (current) use of oral hypoglycemic drugs: Secondary | ICD-10-CM | POA: Diagnosis not present

## 2020-09-05 DIAGNOSIS — S32010D Wedge compression fracture of first lumbar vertebra, subsequent encounter for fracture with routine healing: Secondary | ICD-10-CM | POA: Diagnosis not present

## 2020-09-07 DIAGNOSIS — I509 Heart failure, unspecified: Secondary | ICD-10-CM | POA: Diagnosis not present

## 2020-09-07 DIAGNOSIS — S32019A Unspecified fracture of first lumbar vertebra, initial encounter for closed fracture: Secondary | ICD-10-CM | POA: Diagnosis not present

## 2020-09-07 DIAGNOSIS — R531 Weakness: Secondary | ICD-10-CM | POA: Diagnosis not present

## 2020-09-12 DIAGNOSIS — Z7984 Long term (current) use of oral hypoglycemic drugs: Secondary | ICD-10-CM | POA: Diagnosis not present

## 2020-09-12 DIAGNOSIS — R2681 Unsteadiness on feet: Secondary | ICD-10-CM | POA: Diagnosis not present

## 2020-09-12 DIAGNOSIS — I110 Hypertensive heart disease with heart failure: Secondary | ICD-10-CM | POA: Diagnosis not present

## 2020-09-12 DIAGNOSIS — E1165 Type 2 diabetes mellitus with hyperglycemia: Secondary | ICD-10-CM | POA: Diagnosis not present

## 2020-09-12 DIAGNOSIS — M6281 Muscle weakness (generalized): Secondary | ICD-10-CM | POA: Diagnosis not present

## 2020-09-12 DIAGNOSIS — I5032 Chronic diastolic (congestive) heart failure: Secondary | ICD-10-CM | POA: Diagnosis not present

## 2020-09-12 DIAGNOSIS — S32010D Wedge compression fracture of first lumbar vertebra, subsequent encounter for fracture with routine healing: Secondary | ICD-10-CM | POA: Diagnosis not present

## 2020-09-12 DIAGNOSIS — I2510 Atherosclerotic heart disease of native coronary artery without angina pectoris: Secondary | ICD-10-CM | POA: Diagnosis not present

## 2020-09-16 ENCOUNTER — Ambulatory Visit: Payer: Medicare Other | Admitting: Cardiology

## 2020-09-22 DIAGNOSIS — K8020 Calculus of gallbladder without cholecystitis without obstruction: Secondary | ICD-10-CM

## 2020-09-22 HISTORY — DX: Calculus of gallbladder without cholecystitis without obstruction: K80.20

## 2020-09-25 ENCOUNTER — Other Ambulatory Visit (HOSPITAL_COMMUNITY)
Admission: RE | Admit: 2020-09-25 | Discharge: 2020-09-25 | Disposition: A | Discharge status: Home / Self Care | Payer: Medicare Other | Source: Other Acute Inpatient Hospital | Attending: Family Medicine | Admitting: Family Medicine

## 2020-09-25 DIAGNOSIS — N390 Urinary tract infection, site not specified: Secondary | ICD-10-CM | POA: Insufficient documentation

## 2020-09-25 LAB — URINALYSIS, ROUTINE W REFLEX MICROSCOPIC
Bilirubin Urine: NEGATIVE
Glucose, UA: NEGATIVE mg/dL
Hgb urine dipstick: NEGATIVE
Ketones, ur: NEGATIVE mg/dL
Leukocytes,Ua: NEGATIVE
Nitrite: NEGATIVE
Protein, ur: NEGATIVE mg/dL
Specific Gravity, Urine: 1 — ABNORMAL LOW (ref 1.005–1.030)
pH: 5 (ref 5.0–8.0)

## 2020-09-26 DIAGNOSIS — M6281 Muscle weakness (generalized): Secondary | ICD-10-CM | POA: Diagnosis not present

## 2020-09-26 DIAGNOSIS — I110 Hypertensive heart disease with heart failure: Secondary | ICD-10-CM | POA: Diagnosis not present

## 2020-09-26 DIAGNOSIS — I5032 Chronic diastolic (congestive) heart failure: Secondary | ICD-10-CM | POA: Diagnosis not present

## 2020-09-26 DIAGNOSIS — E1165 Type 2 diabetes mellitus with hyperglycemia: Secondary | ICD-10-CM | POA: Diagnosis not present

## 2020-09-26 DIAGNOSIS — S32010D Wedge compression fracture of first lumbar vertebra, subsequent encounter for fracture with routine healing: Secondary | ICD-10-CM | POA: Diagnosis not present

## 2020-09-26 DIAGNOSIS — I2510 Atherosclerotic heart disease of native coronary artery without angina pectoris: Secondary | ICD-10-CM | POA: Diagnosis not present

## 2020-09-26 DIAGNOSIS — R2681 Unsteadiness on feet: Secondary | ICD-10-CM | POA: Diagnosis not present

## 2020-09-26 DIAGNOSIS — Z7984 Long term (current) use of oral hypoglycemic drugs: Secondary | ICD-10-CM | POA: Diagnosis not present

## 2020-09-27 LAB — URINE CULTURE: Culture: >=100000 — AB

## 2020-10-01 ENCOUNTER — Encounter (HOSPITAL_COMMUNITY): Payer: Self-pay | Admitting: Emergency Medicine

## 2020-10-01 ENCOUNTER — Emergency Department (HOSPITAL_COMMUNITY): Payer: Medicare Other

## 2020-10-01 ENCOUNTER — Other Ambulatory Visit: Payer: Self-pay

## 2020-10-01 ENCOUNTER — Inpatient Hospital Stay (HOSPITAL_COMMUNITY)
Admission: EM | Admit: 2020-10-01 | Discharge: 2020-10-06 | DRG: 445 | Disposition: A | Discharge status: Skilled Nursing Facility | Payer: Medicare Other | Source: Skilled Nursing Facility | Attending: Internal Medicine | Admitting: Internal Medicine

## 2020-10-01 DIAGNOSIS — Z66 Do not resuscitate: Secondary | ICD-10-CM | POA: Diagnosis present

## 2020-10-01 DIAGNOSIS — Z79899 Other long term (current) drug therapy: Secondary | ICD-10-CM | POA: Diagnosis not present

## 2020-10-01 DIAGNOSIS — D500 Iron deficiency anemia secondary to blood loss (chronic): Secondary | ICD-10-CM | POA: Diagnosis present

## 2020-10-01 DIAGNOSIS — K8070 Calculus of gallbladder and bile duct without cholecystitis without obstruction: Secondary | ICD-10-CM | POA: Diagnosis not present

## 2020-10-01 DIAGNOSIS — Z8679 Personal history of other diseases of the circulatory system: Secondary | ICD-10-CM | POA: Diagnosis not present

## 2020-10-01 DIAGNOSIS — Z743 Need for continuous supervision: Secondary | ICD-10-CM | POA: Diagnosis not present

## 2020-10-01 DIAGNOSIS — E7800 Pure hypercholesterolemia, unspecified: Secondary | ICD-10-CM | POA: Diagnosis not present

## 2020-10-01 DIAGNOSIS — K7689 Other specified diseases of liver: Secondary | ICD-10-CM | POA: Diagnosis not present

## 2020-10-01 DIAGNOSIS — K269 Duodenal ulcer, unspecified as acute or chronic, without hemorrhage or perforation: Secondary | ICD-10-CM | POA: Diagnosis present

## 2020-10-01 DIAGNOSIS — I5032 Chronic diastolic (congestive) heart failure: Secondary | ICD-10-CM | POA: Diagnosis not present

## 2020-10-01 DIAGNOSIS — I351 Nonrheumatic aortic (valve) insufficiency: Secondary | ICD-10-CM | POA: Diagnosis not present

## 2020-10-01 DIAGNOSIS — F0280 Dementia in other diseases classified elsewhere without behavioral disturbance: Secondary | ICD-10-CM | POA: Diagnosis not present

## 2020-10-01 DIAGNOSIS — Z85828 Personal history of other malignant neoplasm of skin: Secondary | ICD-10-CM

## 2020-10-01 DIAGNOSIS — K8050 Calculus of bile duct without cholangitis or cholecystitis without obstruction: Secondary | ICD-10-CM | POA: Diagnosis not present

## 2020-10-01 DIAGNOSIS — Z90710 Acquired absence of both cervix and uterus: Secondary | ICD-10-CM

## 2020-10-01 DIAGNOSIS — Z7982 Long term (current) use of aspirin: Secondary | ICD-10-CM | POA: Diagnosis not present

## 2020-10-01 DIAGNOSIS — F0390 Unspecified dementia without behavioral disturbance: Secondary | ICD-10-CM | POA: Diagnosis present

## 2020-10-01 DIAGNOSIS — R5381 Other malaise: Secondary | ICD-10-CM | POA: Diagnosis not present

## 2020-10-01 DIAGNOSIS — K838 Other specified diseases of biliary tract: Secondary | ICD-10-CM | POA: Diagnosis not present

## 2020-10-01 DIAGNOSIS — R17 Unspecified jaundice: Secondary | ICD-10-CM | POA: Diagnosis not present

## 2020-10-01 DIAGNOSIS — I2721 Secondary pulmonary arterial hypertension: Secondary | ICD-10-CM | POA: Diagnosis present

## 2020-10-01 DIAGNOSIS — K5710 Diverticulosis of small intestine without perforation or abscess without bleeding: Secondary | ICD-10-CM | POA: Diagnosis not present

## 2020-10-01 DIAGNOSIS — I110 Hypertensive heart disease with heart failure: Secondary | ICD-10-CM | POA: Diagnosis not present

## 2020-10-01 DIAGNOSIS — M545 Low back pain, unspecified: Secondary | ICD-10-CM | POA: Diagnosis not present

## 2020-10-01 DIAGNOSIS — I10 Essential (primary) hypertension: Secondary | ICD-10-CM | POA: Diagnosis present

## 2020-10-01 DIAGNOSIS — I255 Ischemic cardiomyopathy: Secondary | ICD-10-CM | POA: Diagnosis not present

## 2020-10-01 DIAGNOSIS — Z20822 Contact with and (suspected) exposure to covid-19: Secondary | ICD-10-CM | POA: Diagnosis present

## 2020-10-01 DIAGNOSIS — I2510 Atherosclerotic heart disease of native coronary artery without angina pectoris: Secondary | ICD-10-CM | POA: Diagnosis present

## 2020-10-01 DIAGNOSIS — K769 Liver disease, unspecified: Secondary | ICD-10-CM | POA: Diagnosis not present

## 2020-10-01 DIAGNOSIS — I252 Old myocardial infarction: Secondary | ICD-10-CM | POA: Diagnosis not present

## 2020-10-01 DIAGNOSIS — K8020 Calculus of gallbladder without cholecystitis without obstruction: Secondary | ICD-10-CM | POA: Diagnosis not present

## 2020-10-01 DIAGNOSIS — M549 Dorsalgia, unspecified: Secondary | ICD-10-CM | POA: Diagnosis present

## 2020-10-01 DIAGNOSIS — E11649 Type 2 diabetes mellitus with hypoglycemia without coma: Secondary | ICD-10-CM | POA: Diagnosis not present

## 2020-10-01 DIAGNOSIS — K8051 Calculus of bile duct without cholangitis or cholecystitis with obstruction: Secondary | ICD-10-CM | POA: Diagnosis not present

## 2020-10-01 DIAGNOSIS — Z794 Long term (current) use of insulin: Secondary | ICD-10-CM | POA: Diagnosis not present

## 2020-10-01 DIAGNOSIS — E785 Hyperlipidemia, unspecified: Secondary | ICD-10-CM | POA: Diagnosis not present

## 2020-10-01 DIAGNOSIS — D539 Nutritional anemia, unspecified: Secondary | ICD-10-CM | POA: Diagnosis not present

## 2020-10-01 DIAGNOSIS — E119 Type 2 diabetes mellitus without complications: Secondary | ICD-10-CM

## 2020-10-01 DIAGNOSIS — I428 Other cardiomyopathies: Secondary | ICD-10-CM | POA: Diagnosis not present

## 2020-10-01 DIAGNOSIS — R748 Abnormal levels of other serum enzymes: Secondary | ICD-10-CM | POA: Diagnosis not present

## 2020-10-01 DIAGNOSIS — I700 Atherosclerosis of aorta: Secondary | ICD-10-CM | POA: Diagnosis present

## 2020-10-01 DIAGNOSIS — R451 Restlessness and agitation: Secondary | ICD-10-CM | POA: Diagnosis not present

## 2020-10-01 DIAGNOSIS — D638 Anemia in other chronic diseases classified elsewhere: Secondary | ICD-10-CM | POA: Diagnosis not present

## 2020-10-01 DIAGNOSIS — Z01810 Encounter for preprocedural cardiovascular examination: Secondary | ICD-10-CM | POA: Diagnosis not present

## 2020-10-01 DIAGNOSIS — G309 Alzheimer's disease, unspecified: Secondary | ICD-10-CM | POA: Diagnosis not present

## 2020-10-01 LAB — COMPREHENSIVE METABOLIC PANEL
ALT: 69 U/L — ABNORMAL HIGH (ref 0–44)
AST: 98 U/L — ABNORMAL HIGH (ref 15–41)
Albumin: 2.5 g/dL — ABNORMAL LOW (ref 3.5–5.0)
Alkaline Phosphatase: 876 U/L — ABNORMAL HIGH (ref 38–126)
Anion gap: 9 (ref 5–15)
BUN: 21 mg/dL (ref 8–23)
CO2: 21 mmol/L — ABNORMAL LOW (ref 22–32)
Calcium: 8.9 mg/dL (ref 8.9–10.3)
Chloride: 103 mmol/L (ref 98–111)
Creatinine, Ser: 1.01 mg/dL — ABNORMAL HIGH (ref 0.44–1.00)
GFR, Estimated: 53 mL/min — ABNORMAL LOW (ref >60)
Glucose, Bld: 220 mg/dL — ABNORMAL HIGH (ref 70–99)
Potassium: 4.4 mmol/L (ref 3.5–5.1)
Sodium: 133 mmol/L — ABNORMAL LOW (ref 135–145)
Total Bilirubin: 8.3 mg/dL — ABNORMAL HIGH (ref 0.3–1.2)
Total Protein: 6.1 g/dL — ABNORMAL LOW (ref 6.5–8.1)

## 2020-10-01 LAB — ETHANOL: Alcohol, Ethyl (B): <10 mg/dL (ref <10)

## 2020-10-01 LAB — CBC WITH DIFFERENTIAL/PLATELET
Abs Immature Granulocytes: 0.03 10*3/uL (ref 0.00–0.07)
Basophils Absolute: 0 10*3/uL (ref 0.0–0.1)
Basophils Relative: 0 %
Eosinophils Absolute: 0 10*3/uL (ref 0.0–0.5)
Eosinophils Relative: 0 %
HCT: 26.2 % — ABNORMAL LOW (ref 36.0–46.0)
Hemoglobin: 8.4 g/dL — ABNORMAL LOW (ref 12.0–15.0)
Immature Granulocytes: 0 %
Lymphocytes Relative: 5 %
Lymphs Abs: 0.5 10*3/uL — ABNORMAL LOW (ref 0.7–4.0)
MCH: 33.7 pg (ref 26.0–34.0)
MCHC: 32.1 g/dL (ref 30.0–36.0)
MCV: 105.2 fL — ABNORMAL HIGH (ref 80.0–100.0)
Monocytes Absolute: 0.4 10*3/uL (ref 0.1–1.0)
Monocytes Relative: 4 %
Neutro Abs: 8.7 10*3/uL — ABNORMAL HIGH (ref 1.7–7.7)
Neutrophils Relative %: 91 %
Platelets: 145 10*3/uL — ABNORMAL LOW (ref 150–400)
RBC: 2.49 MIL/uL — ABNORMAL LOW (ref 3.87–5.11)
RDW: 18.1 % — ABNORMAL HIGH (ref 11.5–15.5)
WBC: 9.7 10*3/uL (ref 4.0–10.5)
nRBC: 0.2 % (ref 0.0–0.2)

## 2020-10-01 LAB — URINALYSIS, ROUTINE W REFLEX MICROSCOPIC
Glucose, UA: NEGATIVE mg/dL
Hgb urine dipstick: NEGATIVE
Ketones, ur: NEGATIVE mg/dL
Leukocytes,Ua: NEGATIVE
Nitrite: NEGATIVE
Protein, ur: NEGATIVE mg/dL
Specific Gravity, Urine: >1.046 — ABNORMAL HIGH (ref 1.005–1.030)
pH: 5 (ref 5.0–8.0)

## 2020-10-01 LAB — CBG MONITORING, ED: Glucose-Capillary: 122 mg/dL — ABNORMAL HIGH (ref 70–99)

## 2020-10-01 LAB — CK: Total CK: 8 U/L — ABNORMAL LOW (ref 38–234)

## 2020-10-01 LAB — RESPIRATORY PANEL BY RT PCR (FLU A&B, COVID)
Influenza A by PCR: NEGATIVE
Influenza B by PCR: NEGATIVE
SARS Coronavirus 2 by RT PCR: NEGATIVE

## 2020-10-01 LAB — LIPASE, BLOOD: Lipase: 27 U/L (ref 11–51)

## 2020-10-01 LAB — LACTATE DEHYDROGENASE: LDH: 115 U/L (ref 98–192)

## 2020-10-01 MED ORDER — SODIUM CHLORIDE 0.9 % IV SOLN: INTRAVENOUS | Status: DC

## 2020-10-01 MED ORDER — PIPERACILLIN-TAZOBACTAM 3.375 G IVPB 30 MIN
3.3750 g | Freq: Once | INTRAVENOUS | Status: AC
  Administered 2020-10-01: 3.375 g via INTRAVENOUS
  Filled 2020-10-01: qty 50

## 2020-10-01 MED ORDER — INSULIN ASPART 100 UNIT/ML ~~LOC~~ SOLN
0.0000 [IU] | SUBCUTANEOUS | Status: DC
  Administered 2020-10-02 – 2020-10-03 (×2): 1 [IU] via SUBCUTANEOUS
  Administered 2020-10-04: 2 [IU] via SUBCUTANEOUS
  Administered 2020-10-04 (×3): 1 [IU] via SUBCUTANEOUS
  Administered 2020-10-05: 2 [IU] via SUBCUTANEOUS
  Administered 2020-10-05 – 2020-10-06 (×4): 1 [IU] via SUBCUTANEOUS

## 2020-10-01 MED ORDER — HALOPERIDOL LACTATE 5 MG/ML IJ SOLN
INTRAMUSCULAR | Status: AC
  Administered 2020-10-01: 5 mg via INTRAMUSCULAR
  Filled 2020-10-01: qty 1

## 2020-10-01 MED ORDER — CARVEDILOL 25 MG PO TABS
25.0000 mg | ORAL_TABLET | Freq: Two times a day (BID) | ORAL | Status: DC
  Administered 2020-10-02 – 2020-10-06 (×6): 25 mg via ORAL
  Filled 2020-10-01 (×8): qty 1

## 2020-10-01 MED ORDER — IOHEXOL 300 MG/ML  SOLN
75.0000 mL | Freq: Once | INTRAMUSCULAR | Status: AC | PRN
  Administered 2020-10-01: 75 mL via INTRAVENOUS

## 2020-10-01 MED ORDER — HALOPERIDOL LACTATE 5 MG/ML IJ SOLN: 5.0000 mg | Freq: Once | INTRAMUSCULAR | Status: AC

## 2020-10-01 MED ORDER — PIPERACILLIN-TAZOBACTAM 3.375 G IVPB
3.3750 g | Freq: Three times a day (TID) | INTRAVENOUS | Status: DC
  Administered 2020-10-01 – 2020-10-06 (×14): 3.375 g via INTRAVENOUS
  Filled 2020-10-01 (×13): qty 50

## 2020-10-01 MED ORDER — HYDROCODONE-ACETAMINOPHEN 5-325 MG PO TABS: 1.0000 | ORAL_TABLET | ORAL | Status: DC | PRN

## 2020-10-01 NOTE — ED Provider Notes (Signed)
Care transferred to me. Patient's CT has been reviewed and shows obstructing biliary calculus. Has cholelithiasis but no cholecystitis. Discussed with Dr. Laural Golden, and given her co-morbidities her ERCP would be better done in [place].  Dr. Waldron Labs to admit. I discussed with Dr. Luan Pulling, and GI will be able to do an ERCP at [facility] for this. Will give zosyn.    Sherwood Gambler, MD 10/01/20 [phone]

## 2020-10-01 NOTE — H&P (Signed)
TRH H&P   Patient Demographics:    Robin Guzman, is a 86 y.o. female  MRN: [id number]   DOB - 1928/10/21  Admit Date - 10/01/2020  Outpatient Primary MD for the patient is Celene Squibb, MD  Referring MD/NP/PA: Dr Verta Ellen    Patient coming from: SNF  Chief Complaint  Patient presents with  . Jaundice      HPI:    Robin Guzman  is a 86 y.o. female,with medical history significant for hypertension, hyperlipidemia, type 2 diabetes mellitus, CAD status post NSTEMI 07/2017 cath with mild nonobstructive disease, LVEF 25 to 30% with wall motion abnormalities suggesting stress-induced cardiomyopathy and elevated LVEDP,LBBB, hard of hearing and dementia. -Patient was sent by ED for evaluation of jaundice, patient herself cannot provide any complaints, family went to visit patient in the facility, they noted her to be jaundiced, and her oxygen level low, labs were significant for elevated bilirubin, so she was sent to ED for further evaluation, patient with advanced dementia, herself cannot provide any complaints. - in ED for total bilirubin of 8.3, AST of 98, ALT of 69, with lipase of 27, creatinine was at baseline at 1.01 had no leukocytosis, her hemoglobin lower than baseline at 8.4, CT abdomen pelvis was significant for mild intrahepatic biliary duct dilation with evidence of 9 mm calculus in the downstream common bile duct.  [facility] hospitalist consulted to admit    Review of systems:   Patient with advanced dementia, she  cannot provide any complaints   With Past History of the following :    Past Medical History:  Diagnosis Date  . Arthritis   . CAD (coronary artery disease), native coronary artery    07/27/17 nonobstructive, EF 25-35% by LV gram  . CHF (congestive heart failure) ([place])   . Dementia ([place])   . Diabetes mellitus without complication ([place])   . Dyslipidemia   .  Dyspnea   . Dyspnea on exertion   . History of stress test 07/05/2006   High risk scan cardiac cathe was recommended.  [name] Hx of echocardiogram 04/09/2011   EF 55% Mildly hypertrophic left ventricle with normal systolic function, Moderate (grade II) diastolic dysfunction. Elevated left atrial pressure. No significant valvular abnormalities. No pericardial effusion. Mild to moderate pulmonary arterial hypertension.  . Hypercholesteremia   . Hypertension   . Myocardial infarct ([place])   . Skin cancer    HISTORY OF       Past Surgical History:  Procedure Laterality Date  . ABDOMINAL HYSTERECTOMY    . CATARACT EXTRACTION W/PHACO Right 05/20/2017   Procedure: CATARACT EXTRACTION PHACO AND INTRAOCULAR LENS PLACEMENT RIGHT EYE;  Surgeon: Tonny Branch, MD;  Location: [facility];  Service: Ophthalmology;  Laterality: Right;  CDE: 17.77  . CATARACT EXTRACTION W/PHACO Left 06/21/2017   Procedure: CATARACT EXTRACTION PHACO AND INTRAOCULAR LENS PLACEMENT (IOC);  Surgeon: Tonny Branch, MD;  Location: [facility]  ORS;  Service: Ophthalmology;  Laterality: Left;  CDE: 8.90  . LEFT HEART CATH AND CORONARY ANGIOGRAPHY N/A 07/27/2017   Procedure: LEFT HEART CATH AND CORONARY ANGIOGRAPHY;  Surgeon: Wellington Hampshire, MD;  Location: [facility];  Service: Cardiovascular;  Laterality: N/A;      Social History:     Social History   Tobacco Use  . Smoking status: Never Smoker  . Smokeless tobacco: Never Used  Substance Use Topics  . Alcohol use: No       Family History :     Family History  Problem Relation Age of Onset  . Cancer Mother   . Heart Problems Father   . Cancer Brother        brain  . Cancer Sister        brain     Home Medications:   Prior to Admission medications   Medication Sig Start Date End Date Taking? Authorizing Provider  acetaminophen (TYLENOL) 500 MG tablet Take 1,000 mg by mouth in the morning and at bedtime.   Yes [provider]  aspirin EC 81 MG tablet Take 1  tablet (81 mg total) by mouth daily with breakfast. 06/25/20  Yes Emokpae, Courage, MD  atorvastatin (LIPITOR) 10 MG tablet Take 10 mg by mouth daily.   Yes [provider]  carvedilol (COREG) 25 MG tablet Take 1 tablet (25 mg total) by mouth 2 (two) times daily with a meal. 10/23/18  Yes Croitoru, Mihai, MD  famotidine (PEPCID) 20 MG tablet Take 20 mg by mouth 2 (two) times daily. 08/05/20  Yes [provider]  ferrous sulfate 325 (65 FE) MG tablet Take 325 mg by mouth daily.   Yes [provider]  HYDROcodone-acetaminophen (NORCO/VICODIN) 5-325 MG tablet Take 0.5 tablets by mouth every 4 (four) hours as needed for moderate pain or severe pain. 06/25/20  Yes Emokpae, Courage, MD  LORazepam (ATIVAN) 0.5 MG tablet Take 1 tablet (0.5 mg total) by mouth every 12 (twelve) hours as needed for anxiety or sleep. 06/25/20 06/25/21 Yes Emokpae, Courage, MD  metFORMIN (GLUCOPHAGE) 500 MG tablet Take 500 mg by mouth 2 (two) times daily with a meal.   Yes [provider]  Multiple Vitamins-Minerals (CENTRUM ADULTS) TABS Take 1 tablet by mouth daily.   Yes [provider]  polyethylene glycol (MIRALAX) 17 g packet Take 17 g by mouth daily. 06/25/20  Yes Emokpae, Courage, MD  senna-docusate (SENOKOT-S) 8.6-50 MG tablet Take 2 tablets by mouth 2 (two) times daily. 06/25/20  Yes Emokpae, Courage, MD  insulin aspart (NOVOLOG) 100 UNIT/ML FlexPen Inject 0-6 Units into the skin 3 (three) times daily with meals. insulin aspart (novoLOG) injection 0-6 Units 0-6 Units Subcutaneous, 3 times daily with meals CBG < 70: Implement Hypoglycemia Standing Orders and refer to Hypoglycemia Standing Orders sidebar report  CBG 70 - 120: 0 unit CBG 121 - 150: 0 unit  CBG 151 - 200: 0 unit CBG 201 - 250: 1 units CBG 251 - 300:  2units CBG 301 - 350:  3 units  CBG 351 - 400:  5 units  CBG > 400: 6 units Patient not taking: Reported on 10/01/2020 06/25/20   Roxan Hockey, MD     Allergies:     Allergies    Allergen Reactions  . Daypro [Oxaprozin] Shortness Of Breath and Other (See Comments)    weakness  . Advil [Ibuprofen] Other (See Comments)    weakness     Physical Exam:  Vitals  Blood pressure (!) 113/58, pulse 66, temperature 98 F (36.7 C), temperature source Oral, resp. rate (!) 8, height 5\' 4"  (1.626 m), weight 66.7 kg, SpO2 98 %.   1. General frail elderly female, laying in bed, no apparent distress  2.  With advanced dementia, extremely hard of hearing, unable to answer any questions appropriately   3. No F.N deficits, ALL C.Nerves Intact, Strength 5/5 all 4 extremities, Sensation intact all 4 extremities, Plantars down going.  4. Ears and Eyes appear Normal, Conjunctivae clear, PERRLA. Moist Oral Mucosa.  5. Supple Neck, No JVD, No cervical lymphadenopathy appriciated, No Carotid Bruits.  6. Symmetrical Chest wall movement, Good air movement bilaterally, CTAB.  7. RRR, No Gallops, Rubs or Murmurs, No Parasternal Heave.  8. Positive Bowel Sounds, Abdomen Soft, No tenderness, No organomegaly appriciated,No rebound -guarding or rigidity.  9.  No Cyanosis, Normal Skin Turgor, No Skin Rash or Bruise.  10. Good muscle tone,  joints appear normal , no effusions, Normal ROM.  11. No Palpable Lymph Nodes in Neck or Axillae   Data Review:    CBC Recent Labs  Lab 10/01/20 1415  WBC 9.7  HGB 8.4*  HCT 26.2*  PLT 145*  MCV 105.2*  MCH 33.7  MCHC 32.1  RDW 18.1*  LYMPHSABS 0.5*  MONOABS 0.4  EOSABS 0.0  BASOSABS 0.0   ------------------------------------------------------------------------------------------------------------------  Chemistries  Recent Labs  Lab 10/01/20 1415  NA 133*  K 4.4  CL 103  CO2 21*  GLUCOSE 220*  BUN 21  CREATININE 1.01*  CALCIUM 8.9  AST 98*  ALT 69*  ALKPHOS 876*  BILITOT 8.3*   ------------------------------------------------------------------------------------------------------------------ estimated creatinine  clearance is 34.8 mL/min (A) (by C-G formula based on SCr of 1.01 mg/dL (H)). ------------------------------------------------------------------------------------------------------------------ No results for input(s): TSH, T4TOTAL, T3FREE, THYROIDAB in the last 72 hours.  Invalid input(s): FREET3  Coagulation profile No results for input(s): INR, PROTIME in the last 168 hours. ------------------------------------------------------------------------------------------------------------------- No results for input(s): DDIMER in the last 72 hours. -------------------------------------------------------------------------------------------------------------------  Cardiac Enzymes No results for input(s): CKMB, TROPONINI, MYOGLOBIN in the last 168 hours.  Invalid input(s): CK ------------------------------------------------------------------------------------------------------------------    Component Value Date/Time   BNP 59.0 02/09/2018 0952     ---------------------------------------------------------------------------------------------------------------  Urinalysis    Component Value Date/Time   COLORURINE AMBER (A) 10/01/2020 1757   APPEARANCEUR CLEAR 10/01/2020 1757   LABSPEC >1.046 (H) 10/01/2020 1757   PHURINE 5.0 10/01/2020 1757   GLUCOSEU NEGATIVE 10/01/2020 1757   HGBUR NEGATIVE 10/01/2020 1757   BILIRUBINUR SMALL (A) 10/01/2020 1757   KETONESUR NEGATIVE 10/01/2020 1757   PROTEINUR NEGATIVE 10/01/2020 1757   UROBILINOGEN 1.0 12/16/2013 1415   NITRITE NEGATIVE 10/01/2020 1757   LEUKOCYTESUR NEGATIVE 10/01/2020 1757    ----------------------------------------------------------------------------------------------------------------   Imaging Results:    CT ABDOMEN PELVIS W CONTRAST  Result Date: 10/01/2020 CLINICAL DATA:  Mid abdominal pain, dementia, jaundice EXAM: CT ABDOMEN AND PELVIS WITH CONTRAST TECHNIQUE: Multidetector CT imaging of the abdomen and pelvis was  performed using the standard protocol following bolus administration of intravenous contrast. CONTRAST:  76mL OMNIPAQUE IOHEXOL 300 MG/ML  SOLN COMPARISON:  06/22/2020 FINDINGS: Lower chest: No acute pleural or parenchymal lung disease. Hepatobiliary: Multiple calcified gallstones are identified. No evidence of gallbladder wall thickening or acute cholecystitis. There is mild intrahepatic biliary duct dilation. No focal liver abnormalities. Common bile duct measures up to 9 mm in diameter, which is grossly normal for age. There is a 9 mm calculus within the downstream common bile duct best  seen on image 24 series 3. Pancreas: Unremarkable. No pancreatic ductal dilatation or surrounding inflammatory changes. Spleen: Spleen is enlarged measuring 15.1 cm in craniocaudal length. No focal splenic abnormalities. Adrenals/Urinary Tract: There are bilateral renal cortical cysts, largest off the lower pole left kidney measuring up to 6.8 cm. Bilateral renal cortical thinning. No nephrolithiasis or obstructive uropathy. The bladder is unremarkable. The adrenals are normal. Stomach/Bowel: No bowel obstruction or ileus. Distal colonic diverticulosis without diverticulitis. No bowel wall thickening or inflammatory change. Vascular/Lymphatic: Aortic atherosclerosis. No enlarged abdominal or pelvic lymph nodes. Reproductive: Uterus is surgically absent. There is a large cyst within the left adnexa measuring 9.8 x 6.6 x 6.2 cm. No mural nodularity or internal septation. No right adnexal mass. Other: No free fluid or free gas.  No abdominal wall hernia. Musculoskeletal: There is a subacute left anterolateral sixth rib fracture. Chronic L1 compression fracture is again noted. Reconstructed images demonstrate no additional findings. IMPRESSION: 1. Mild intrahepatic biliary duct dilation, with evidence of a 9 mm calculus in the downstream common bile duct. ERCP may be useful. 2. Cholelithiasis without cholecystitis. 3. 9.8 cm left  adnexal cyst. Recommend follow-up US in 6-12 months. Note: This recommendation does not apply to premenarchal patients and to those with increased risk (genetic, family history, elevated tumor markers or other high-risk factors) of ovarian cancer. Reference: JACR 2020 Feb; 17(2):248-254 4. Splenomegaly. Electronically Signed   By: Randa Ngo M.D.   On: 10/01/2020 17:14    My personal review of EKG: Rhythm NSR, Rate  78 /min, QTc 450 with LBBB at baseline   Assessment & Plan:    Active Problems:   Chronic diastolic heart failure (HCC)   Essential hypertension   Controlled type 2 diabetes mellitus without complication, without long-term current use of insulin (HCC)   Dyslipidemia   Atherosclerosis of aorta (HCC)   Back pain   Choledocholithiasis   Choledocholithiasis with possible cholangitis/obstructive jaundice -She does present with jaundice, but she is afebrile, with no leukocytosis, she will be kept empirically on IV Zosyn, she will be kept n.p.o., and on IV fluids. -She will need ERCP for stone extraction, discussed with Dr. Alessandra Bevels from [facility], who will arrange for ERCP by Dr. Paulita Fujita tomorrow, but patient will need cardiac clearance for procedure given her cardiac history, so will place consult in epic for cardiology. -Continue to monitor LFTs closely -Lipase within normal limit   CAD/ischemic cardiomyopathy - history of CAD status post NSTEMI 07/2017 cath with mild nonobstructive disease, LVEF 25 to 30% with wall motion abnormalities suggesting stress-induced cardiomyopathy and elevated LVEDP -Continue with Coreg, hold aspirin anticipation for procedure, will need cardiology clearance before procedure  Hypertension -Continue with Coreg  Dementia -Continue with supportive care  9.8 cm left adnexal cyst to dental finding on imaging. -Radiology recommend follow-up US in 6-12 months  DVT Prophylaxis Heparin  AM Labs Ordered, also please review Full Orders  Family  Communication: Admission, patients condition and plan of care including tests being ordered have been discussed with the patient and daughter at bedside who indicate understanding and agree with the plan and Code Status.  Code Status DNR, confirmed by daughter  Likely DC to  Back to SNF  Condition GUARDED    Consults called: ED discussed with Dr. Melony Overly, I have discussed with Dr. Alessandra Bevels.  Admission status: inpatient    Time spent in minutes : 60 minutes   Phillips Climes M.D on 10/01/2020 at 7:12 PM   [facility] - Office  [phone]     

## 2020-10-01 NOTE — ED Triage Notes (Signed)
Pt was sent from [facility] for hypoxia and jaundice  Pt yellow in color and in sclera.

## 2020-10-01 NOTE — ED Provider Notes (Signed)
[facility] EMERGENCY DEPARTMENT Provider Note   CSN: [id number] Arrival date & time: 10/01/20  1404     History Chief Complaint  Patient presents with   Jaundice    Robin Guzman is a 86 y.o. female.  HPI Patient presents via EMS from [facility], and assisted living facility.  Patient herself has dementia, level 5 caveat. The patient is perseverating on wanting to go home.  She cannot provide additional details of her presentation. Seemingly the patient was at the nursing facility, when family went to go visit her.  They found her oxygenation status to be low, but EMS reports that on their transport the patient had no hypoxia, no increased work of breathing, no oxygen requirement. Family also notes the patient was newly jaundiced.  EMS reports no notable events, hemodynamic stability in route.  Past Medical History:  Diagnosis Date   Arthritis    CAD (coronary artery disease), native coronary artery    07/27/17 nonobstructive, EF 25-35% by LV gram   CHF (congestive heart failure) (HCC)    Dementia (HCC)    Diabetes mellitus without complication (HCC)    Dyslipidemia    Dyspnea    Dyspnea on exertion    History of stress test 07/05/2006   High risk scan cardiac cathe was recommended.   Hx of echocardiogram 04/09/2011   EF 55% Mildly hypertrophic left ventricle with normal systolic function, Moderate (grade II) diastolic dysfunction. Elevated left atrial pressure. No significant valvular abnormalities. No pericardial effusion. Mild to moderate pulmonary arterial hypertension.   Hypercholesteremia    Hypertension    Myocardial infarct [facility])    Skin cancer    HISTORY OF     Patient Active Problem List   Diagnosis Date Noted   Compression fracture of L1 vertebra ([place]) 06/23/2020   Leukocytosis 06/23/2020   Macrocytic anemia 06/23/2020   Hyperglycemia due to diabetes mellitus ([place]) 06/23/2020   Thrombocytopenia ([place]) 06/23/2020   Back pain  06/23/2020   Fall at home, initial encounter 06/23/2020   SVT (supraventricular tachycardia) ([place]) 10/23/2018   PAT (paroxysmal atrial tachycardia) ([place]) 04/23/2018   LBBB (left bundle branch block) 04/23/2018   Dyslipidemia 04/23/2018   Atherosclerosis of aorta ([place]) 57/32/2025   Acute systolic heart failure ([place]) 07/28/2017   Sundowning 07/28/2017   NSTEMI (non-ST elevated myocardial infarction) ([place]) 07/26/2017   Closed 3-part fracture of proximal humerus, left, with routine healing, subsequent encounter 12/02/2016   UTI (urinary tract infection) 12/25/2013   Dyspnea 12/25/2013   Near syncope 12/25/2013   CAD (coronary artery disease) 12/25/2013   Chronic diastolic heart failure ([place]) 08/07/2013   Essential hypertension 08/07/2013   Controlled type 2 diabetes mellitus without complication, without long-term current use of insulin ([place]) 08/07/2013   Hypercholesteremia 08/07/2013    Past Surgical History:  Procedure Laterality Date   ABDOMINAL HYSTERECTOMY     CATARACT EXTRACTION W/PHACO Right 05/20/2017   Procedure: CATARACT EXTRACTION PHACO AND INTRAOCULAR LENS PLACEMENT RIGHT EYE;  Surgeon: Tonny Branch, MD;  Location: [facility];  Service: Ophthalmology;  Laterality: Right;  CDE: 17.77   CATARACT EXTRACTION W/PHACO Left 06/21/2017   Procedure: CATARACT EXTRACTION PHACO AND INTRAOCULAR LENS PLACEMENT (IOC);  Surgeon: Tonny Branch, MD;  Location: [facility];  Service: Ophthalmology;  Laterality: Left;  CDE: 8.90   LEFT HEART CATH AND CORONARY ANGIOGRAPHY N/A 07/27/2017   Procedure: LEFT HEART CATH AND CORONARY ANGIOGRAPHY;  Surgeon: Wellington Hampshire, MD;  Location: [facility] [facility];  Service: Cardiovascular;  Laterality: N/A;  OB History    Gravida  3   Para  3   Term  3   Preterm      AB      Living        SAB      TAB      Ectopic      Multiple      Live Births              Family History  Problem Relation Age of Onset   Cancer Mother     Heart Problems Father    Cancer Brother        brain   Cancer Sister        brain    Social History   Tobacco Use   Smoking status: Never Smoker   Smokeless tobacco: Never Used  Vaping Use   Vaping Use: Never used  Substance Use Topics   Alcohol use: No   Drug use: No    Home Medications Prior to Admission medications   Medication Sig Start Date End Date Taking? Authorizing Provider  acetaminophen (TYLENOL) 500 MG tablet Take 1,000 mg by mouth in the morning and at bedtime.   Yes [provider]  aspirin EC 81 MG tablet Take 1 tablet (81 mg total) by mouth daily with breakfast. 06/25/20  Yes Emokpae, Courage, MD  atorvastatin (LIPITOR) 10 MG tablet Take 10 mg by mouth daily.   Yes [provider]  carvedilol (COREG) 25 MG tablet Take 1 tablet (25 mg total) by mouth 2 (two) times daily with a meal. 10/23/18  Yes Croitoru, Mihai, MD  famotidine (PEPCID) 20 MG tablet Take 20 mg by mouth 2 (two) times daily. 08/05/20  Yes [provider]  ferrous sulfate 325 (65 FE) MG tablet Take 325 mg by mouth daily.   Yes [provider]  HYDROcodone-acetaminophen (NORCO/VICODIN) 5-325 MG tablet Take 0.5 tablets by mouth every 4 (four) hours as needed for moderate pain or severe pain. 06/25/20  Yes Emokpae, Courage, MD  LORazepam (ATIVAN) 0.5 MG tablet Take 1 tablet (0.5 mg total) by mouth every 12 (twelve) hours as needed for anxiety or sleep. 06/25/20 06/25/21 Yes Emokpae, Courage, MD  metFORMIN (GLUCOPHAGE) 500 MG tablet Take 500 mg by mouth 2 (two) times daily with a meal.   Yes [provider]  Multiple Vitamins-Minerals (CENTRUM ADULTS) TABS Take 1 tablet by mouth daily.   Yes [provider]  polyethylene glycol (MIRALAX) 17 g packet Take 17 g by mouth daily. 06/25/20  Yes Emokpae, Courage, MD  senna-docusate (SENOKOT-S) 8.6-50 MG tablet Take 2 tablets by mouth 2 (two) times daily. 06/25/20  Yes Emokpae, Courage, MD  insulin aspart (NOVOLOG)  100 UNIT/ML FlexPen Inject 0-6 Units into the skin 3 (three) times daily with meals. insulin aspart (novoLOG) injection 0-6 Units 0-6 Units Subcutaneous, 3 times daily with meals CBG < 70: Implement Hypoglycemia Standing Orders and refer to Hypoglycemia Standing Orders sidebar report  CBG 70 - 120: 0 unit CBG 121 - 150: 0 unit  CBG 151 - 200: 0 unit CBG 201 - 250: 1 units CBG 251 - 300:  2units CBG 301 - 350:  3 units  CBG 351 - 400:  5 units  CBG > 400: 6 units Patient not taking: Reported on 10/01/2020 06/25/20   Roxan Hockey, MD    Allergies    Daypro [oxaprozin] and Advil [ibuprofen]  Review of Systems   Review of Systems  Unable to perform ROS:  Dementia    Physical Exam Updated Vital Signs BP 109/64    Pulse 93    Temp 98 F (36.7 C) (Oral)    Resp 18    Ht 5\' 4"  (1.626 m)    Wt 66.7 kg    SpO2 100%    BMI 25.23 kg/m   Physical Exam Vitals and nursing note reviewed.  Constitutional:      General: She is not in acute distress.    Appearance: She is well-developed.  HENT:     Head: Normocephalic and atraumatic.  Eyes:     Conjunctiva/sclera: Conjunctivae normal.  Cardiovascular:     Rate and Rhythm: Normal rate and regular rhythm.  Pulmonary:     Effort: Pulmonary effort is normal. No respiratory distress.     Breath sounds: Normal breath sounds. No stridor.  Abdominal:     General: There is no distension.  Skin:    General: Skin is warm and dry.     Coloration: Skin is jaundiced.  Neurological:     Mental Status: She is alert.     Cranial Nerves: No cranial nerve deficit or dysarthria.     Motor: Atrophy present. No tremor or abnormal muscle tone.  Psychiatric:        Behavior: Behavior is agitated and slowed.        Cognition and Memory: Cognition is impaired. Memory is impaired.     ED Results / Procedures / Treatments   Labs (all labs ordered are listed, but only abnormal results are displayed) Labs Reviewed  COMPREHENSIVE METABOLIC PANEL - Abnormal;  Notable for the following components:      Result Value   Sodium 133 (*)    CO2 21 (*)    Glucose, Bld 220 (*)    Creatinine, Ser 1.01 (*)    Total Protein 6.1 (*)    Albumin 2.5 (*)    AST 98 (*)    ALT 69 (*)    Alkaline Phosphatase 876 (*)    Total Bilirubin 8.3 (*)    GFR, Estimated 53 (*)    All other components within normal limits  CBC WITH DIFFERENTIAL/PLATELET - Abnormal; Notable for the following components:   RBC 2.49 (*)    Hemoglobin 8.4 (*)    HCT 26.2 (*)    MCV 105.2 (*)    RDW 18.1 (*)    Platelets 145 (*)    Neutro Abs 8.7 (*)    Lymphs Abs 0.5 (*)    All other components within normal limits  CK - Abnormal; Notable for the following components:   Total CK 8 (*)    All other components within normal limits  ETHANOL  LIPASE, BLOOD  LACTATE DEHYDROGENASE  URINALYSIS, ROUTINE W REFLEX MICROSCOPIC    EKG EKG Interpretation  Date/Time:  Wednesday October 01 2020 14:17:28 EST Ventricular Rate:  78 PR Interval:    QRS Duration: 139 QT Interval:  395 QTC Calculation: 450 R Axis:   -27 Text Interpretation: Sinus rhythm Left bundle branch block Baseline wander Abnormal ECG Confirmed by Carmin Muskrat [phone]) on 10/01/2020 2:20:11 PM   Radiology No results found.  Procedures Procedures (including critical care time)  Medications Ordered in ED Medications  haloperidol lactate (HALDOL) injection 5 mg (5 mg Intramuscular Given 10/01/20 1511)    ED Course  I have reviewed the triage vital signs and the nursing notes.  Pertinent labs & imaging results that were available during my care of the patient were reviewed by me  and considered in my medical decision making (see chart for details).  Adult female with dementia, multiple other medical issues presents from nursing facility with new jaundice. On initial evaluation she is awake and alert, though her dementia is evident. With consideration of hepatobiliary dysfunction labs sent, monitoring  started. Cardiac monitor sinus 80s interventricular conduction delay, abnormal Oximetry 95% room air normal  3:19 PM Patient has been disagreeable, agitated, walking about the hospital, pulled out her IV, and for concern for safety and that of staff sure received 5 mg Haldol. I discussed this with the patient's daughter who arrived at about this time.  Daughter amenable to mild sedation to facilitate evaluation.  On this is necessary as the patient is found to have substantial decrease in hepatic function with hyperbilirubinemia. Daughter notes that during 1 recent outpatient visit she was found to have hepatic dysfunction, has not yet had additional evaluation, however, though.  Adult female presents with jaundice.  Patient's dementia complicates evaluation, but initial findings are concerning for hepatobiliary dysfunction.  To facilitate evaluation she required sedation with Haldol, for CT study. Patient has been ambulatory, is agitated, but awake and alert, moving all extremities spontaneously, and low suspicion for CNS pathology for her agitation.  Higher suspicion for hepatic encephalopathy like consequences. One of the initial concerns was hypoxia, but no evidence for that here, patient has no increased work of breathing has had no hypoxia.  In regards to potential for malignancy given the patient's new hepatic dysfunction, agitation, and context of dementia, family has been considering goals of care.  CT scan results will play a role and further discussions.  Dr. Regenia Skeeter is aware of the patient. Final Clinical Impression(s) / ED Diagnoses Final diagnoses:  Agitation  Jaundice  Liver dysfunction   CRITICAL CARE Performed by: Carmin Muskrat Total critical care time: 35 minutes Critical care time was exclusive of separately billable procedures and treating other patients. Critical care was necessary to treat or prevent imminent or life-threatening deterioration. Critical care was  time spent personally by me on the following activities: development of treatment plan with patient and/or surrogate as well as nursing, discussions with consultants, evaluation of patient's response to treatment, examination of patient, obtaining history from patient or surrogate, ordering and performing treatments and interventions, ordering and review of laboratory studies, ordering and review of radiographic studies, pulse oximetry and re-evaluation of patient's condition.    Carmin Muskrat, MD 10/01/20 [phone]

## 2020-10-01 NOTE — ED Notes (Signed)
Assisted patient to restroom to urinate.

## 2020-10-01 NOTE — Progress Notes (Signed)
Pharmacy Antibiotic Note  Robin Guzman is a 86 y.o. female from assisted living facility admitted on 10/01/2020 with jaundice.  Pharmacy has been consulted for Zosyn dosing for questionable cholangitis. Zosyn 3.375 g IV x1 given tonight at 18:27.   Plan: Zosyn 3.375 g IV every 8 hour - infuse each dose over 4 hours Monitor clinical status, renal function and culture results daily.    Height: 5\' 4"  (162.6 cm) Weight: 66.7 kg (147 lb) IBW/kg (Calculated) : 54.7  Temp (24hrs), Avg:98 F (36.7 C), Min:98 F (36.7 C), Max:98 F (36.7 C)  Recent Labs  Lab 10/01/20 1415  WBC 9.7  CREATININE 1.01*    Estimated Creatinine Clearance: 34.8 mL/min (A) (by C-G formula based on SCr of 1.01 mg/dL (H)).    Allergies  Allergen Reactions  . Daypro [Oxaprozin] Shortness Of Breath and Other (See Comments)    weakness  . Advil [Ibuprofen] Other (See Comments)    weakness    Antimicrobials this admission: Zosyn 11/10>>  Dose adjustments this admission:   Microbiology results:  11/10 Respiratory panel PCR ordered.  Thank you for allowing pharmacy to be a part of this patient's care.  Nicole Cella, RPh Clinical Pharmacist 10/01/2020 7:15 PM

## 2020-10-02 ENCOUNTER — Inpatient Hospital Stay (HOSPITAL_COMMUNITY): Payer: Medicare Other

## 2020-10-02 ENCOUNTER — Encounter (HOSPITAL_COMMUNITY): Payer: Self-pay | Admitting: Internal Medicine

## 2020-10-02 DIAGNOSIS — E119 Type 2 diabetes mellitus without complications: Secondary | ICD-10-CM

## 2020-10-02 DIAGNOSIS — F0280 Dementia in other diseases classified elsewhere without behavioral disturbance: Secondary | ICD-10-CM

## 2020-10-02 DIAGNOSIS — I5032 Chronic diastolic (congestive) heart failure: Secondary | ICD-10-CM

## 2020-10-02 DIAGNOSIS — Z01810 Encounter for preprocedural cardiovascular examination: Secondary | ICD-10-CM

## 2020-10-02 DIAGNOSIS — M545 Low back pain, unspecified: Secondary | ICD-10-CM

## 2020-10-02 DIAGNOSIS — I2510 Atherosclerotic heart disease of native coronary artery without angina pectoris: Secondary | ICD-10-CM

## 2020-10-02 DIAGNOSIS — I700 Atherosclerosis of aorta: Secondary | ICD-10-CM | POA: Diagnosis not present

## 2020-10-02 DIAGNOSIS — R17 Unspecified jaundice: Secondary | ICD-10-CM | POA: Diagnosis not present

## 2020-10-02 DIAGNOSIS — G309 Alzheimer's disease, unspecified: Secondary | ICD-10-CM

## 2020-10-02 DIAGNOSIS — Z8679 Personal history of other diseases of the circulatory system: Secondary | ICD-10-CM | POA: Diagnosis not present

## 2020-10-02 DIAGNOSIS — K8050 Calculus of bile duct without cholangitis or cholecystitis without obstruction: Secondary | ICD-10-CM

## 2020-10-02 DIAGNOSIS — I10 Essential (primary) hypertension: Secondary | ICD-10-CM

## 2020-10-02 DIAGNOSIS — E785 Hyperlipidemia, unspecified: Secondary | ICD-10-CM

## 2020-10-02 LAB — COMPREHENSIVE METABOLIC PANEL
ALT: 56 U/L — ABNORMAL HIGH (ref 0–44)
AST: 62 U/L — ABNORMAL HIGH (ref 15–41)
Albumin: 2.2 g/dL — ABNORMAL LOW (ref 3.5–5.0)
Alkaline Phosphatase: 721 U/L — ABNORMAL HIGH (ref 38–126)
Anion gap: 8 (ref 5–15)
BUN: 13 mg/dL (ref 8–23)
CO2: 22 mmol/L (ref 22–32)
Calcium: 9 mg/dL (ref 8.9–10.3)
Chloride: 105 mmol/L (ref 98–111)
Creatinine, Ser: 1.05 mg/dL — ABNORMAL HIGH (ref 0.44–1.00)
GFR, Estimated: 50 mL/min — ABNORMAL LOW (ref >60)
Glucose, Bld: 159 mg/dL — ABNORMAL HIGH (ref 70–99)
Potassium: 4.5 mmol/L (ref 3.5–5.1)
Sodium: 135 mmol/L (ref 135–145)
Total Bilirubin: 7.4 mg/dL — ABNORMAL HIGH (ref 0.3–1.2)
Total Protein: 5.6 g/dL — ABNORMAL LOW (ref 6.5–8.1)

## 2020-10-02 LAB — CBC WITH DIFFERENTIAL/PLATELET
Abs Immature Granulocytes: 0.03 10*3/uL (ref 0.00–0.07)
Basophils Absolute: 0 10*3/uL (ref 0.0–0.1)
Basophils Relative: 1 %
Eosinophils Absolute: 0 10*3/uL (ref 0.0–0.5)
Eosinophils Relative: 1 %
HCT: 25.4 % — ABNORMAL LOW (ref 36.0–46.0)
Hemoglobin: 8.2 g/dL — ABNORMAL LOW (ref 12.0–15.0)
Immature Granulocytes: 1 %
Lymphocytes Relative: 8 %
Lymphs Abs: 0.5 10*3/uL — ABNORMAL LOW (ref 0.7–4.0)
MCH: 32.9 pg (ref 26.0–34.0)
MCHC: 32.3 g/dL (ref 30.0–36.0)
MCV: 102 fL — ABNORMAL HIGH (ref 80.0–100.0)
Monocytes Absolute: 0.3 10*3/uL (ref 0.1–1.0)
Monocytes Relative: 5 %
Neutro Abs: 5.6 10*3/uL (ref 1.7–7.7)
Neutrophils Relative %: 84 %
Platelets: 185 10*3/uL (ref 150–400)
RBC: 2.49 MIL/uL — ABNORMAL LOW (ref 3.87–5.11)
RDW: 18 % — ABNORMAL HIGH (ref 11.5–15.5)
WBC: 6.6 10*3/uL (ref 4.0–10.5)
nRBC: 0 % (ref 0.0–0.2)

## 2020-10-02 LAB — GLUCOSE, CAPILLARY
Glucose-Capillary: 139 mg/dL — ABNORMAL HIGH (ref 70–99)
Glucose-Capillary: 139 mg/dL — ABNORMAL HIGH (ref 70–99)
Glucose-Capillary: 143 mg/dL — ABNORMAL HIGH (ref 70–99)
Glucose-Capillary: 131 mg/dL — ABNORMAL HIGH (ref 70–99)
Glucose-Capillary: 193 mg/dL — ABNORMAL HIGH (ref 70–99)
Glucose-Capillary: 92 mg/dL (ref 70–99)

## 2020-10-02 LAB — HEMOGLOBIN A1C
Hgb A1c MFr Bld: 5.1 % (ref 4.8–5.6)
Mean Plasma Glucose: 99.67 mg/dL

## 2020-10-02 MED ORDER — GADOBUTROL 1 MMOL/ML IV SOLN
6.0000 mL | Freq: Once | INTRAVENOUS | Status: AC | PRN
  Administered 2020-10-02: 6 mL via INTRAVENOUS

## 2020-10-02 NOTE — Progress Notes (Signed)
Received from [facility]  Patient alert and oriented x 1, to self. Patient has history of dementia.   Patient awake upon entering the room  IV infusing.   Patient's daughter is at the bedside, patient has an order for 1:1 sitter, sitter left once daughter came into the room, she sates that she will be with her mom all day today, advised to let staff know if and when sheleaves to get sitter back in the room.  Bedside table and call bell in reach for patient safety.

## 2020-10-02 NOTE — ED Notes (Signed)
[place] applied at this time due to pt removing equipment.

## 2020-10-02 NOTE — Progress Notes (Signed)
PROGRESS NOTE  Robin Guzman HMC:[id number] DOB: [birth year] DOA: 10/01/2020 PCP: Celene Squibb, MD  Brief History   Robin Guzman  is a 86 y.o. female,with medical history significant forhypertension, hyperlipidemia, type 2 diabetes mellitus, CAD status post NSTEMI 07/2017 cath with mild nonobstructive disease, LVEF 25 to 30% with wall motion abnormalities suggesting stress-induced cardiomyopathy and elevated LVEDP,LBBB, hard of hearing and dementia. -Patient was sent by ED for evaluation of jaundice, patient herself cannot provide any complaints, family went to visit patient in the facility, they noted her to be jaundiced, and her oxygen level low, labs were significant for elevated bilirubin, so she was sent to ED for further evaluation, patient with advanced dementia, herself cannot provide any complaints. - in ED for total bilirubin of 8.3, AST of 98, ALT of 69, with lipase of 27, creatinine was at baseline at 1.01 had no leukocytosis, her hemoglobin lower than baseline at 8.4, CT abdomen pelvis was significant for mild intrahepatic biliary duct dilation with evidence of 9 mm calculus in the downstream common bile duct.   [facility] consulted to admit for further evaluation and treatment. Gastroenterology has been consulted for elevated LFT's and possible ERCP. Cardiology has been consulted to clear the patient for possible ERCP.  Consultants  . Gastroenterology . Cardiology  Procedures  . none  Antibiotics   Anti-infectives (From admission, onward)   Start     Dose/Rate Route Frequency Ordered Stop   10/02/20 0000  piperacillin-tazobactam (ZOSYN) IVPB 3.375 g        3.375 g 12.5 mL/hr over 240 Minutes Intravenous Every 8 hours 10/01/20 1926     10/01/20 1800  piperacillin-tazobactam (ZOSYN) IVPB 3.375 g        3.375 g 100 mL/hr over 30 Minutes Intravenous  Once 10/01/20 1750 10/01/20 1945    .   Subjective  The patient is sitting up at bedside with her daughter. The  daughter states that she seems a little better this morning.  Objective   Vitals:  Vitals:   10/02/20 0950 10/02/20 1330  BP: 135/65 132/79  Pulse: 70 82  Resp: 18 17  Temp: 97.9 F (36.6 C) 97.6 F (36.4 C)  SpO2: 98% 99%    Exam:  Constitutional:  . The patient is awake and alert. She is very HOH. No acute distress. Eyes:  . pupils and irises appear normal . Normal lids and conjunctivae . Sclera are icteric Respiratory:  . No increased work of breathing. . No wheezes, rales, or rhonchi . No tactile fremitus Cardiovascular:  . Regular rate and rhythm . No murmurs, ectopy, or gallups. . No lateral PMI. No thrills. Abdomen:  . Abdomen is soft, non-tender, non-distended . No hernias, masses, or organomegaly . Normoactive bowel sounds.  Musculoskeletal:  . No cyanosis, clubbing, or edema Skin:  . Icteric without lesions or ulcers . palpation of skin: no induration or nodules Neurologic:  . Moving all extremities. No deficits noted. Psychiatric:  . Unable to evaluate due to the patient's inability to cooperate with exam.  I have personally reviewed the following:   Today's Data  . Vitals, CMP, CBC  Imaging  . CT abdomen and pelvis: 26mm Calculus in CBD  Cardiology Data  . EKG: Unchanged from previous  Scheduled Meds: . carvedilol  25 mg Oral BID WC  . insulin aspart  0-6 Units Subcutaneous Q4H   Continuous Infusions: . sodium chloride 75 mL/hr at 10/01/20 2227  . piperacillin-tazobactam (ZOSYN)  IV 3.375 g (10/02/20 0850)  Active Problems:   Chronic diastolic heart failure ([place])   Essential hypertension   Controlled type 2 diabetes mellitus without complication, without long-term current use of insulin (HCC)   Dyslipidemia   Atherosclerosis of aorta (HCC)   Back pain   Choledocholithiasis   LOS: 1 day   Choledocholithiasis with possible cholangitis/obstructive jaundice: 46mm stone seen in distal CBD on CT. She presents with jaundice, but she is  afebrile, with no leukocytosis, she will be kept empirically on IV Zosyn, she will be kept n.p.o., and on IV fluids. She will need ERCP for stone extraction, discussed with Dr. Alessandra Bevels from [facility], who will arrange for ERCP by Dr. Paulita Fujita on 10/03/2020, but patient will need cardiac clearance for procedure given her cardiac history, so will place consult in epic for cardiology. Continue to monitor LFTs closely. Lipase within normal limits.  CAD/ischemic cardiomyopathy: Noted. The patient has a history of CAD status post NSTEMI 07/2017 cath with mild nonobstructive disease, LVEF 25 to 30% with wall motion abnormalities suggesting stress-induced cardiomyopathy and elevated LVEDP. EKG is unchanged from 06/2020. Continue with Coreg, hold aspirin anticipation for procedure. Cardiology has been consulted for clearance for ERCP.  Hypertension: Continue with Coreg.  Dementia: Continue with supportive care.  9.8 cm left adnexal cyst to dental finding on imaging. Radiology recommend follow-up US in 6-12 months.  I have seen and examined this patient myself. I have spent 35 minutes in her evaluation and care.  DVT Prophylaxis Heparin Code Status:  DNR Family Communication: Daughter at bedside. All questions answered to the best of my ability. Disposition: Status is: Inpatient  Remains inpatient appropriate because:Inpatient level of care appropriate due to severity of illness  Dispo: The patient is from: SNF              Anticipated d/c is to: SNF              Anticipated d/c date is: 2 days              Patient currently is not medically stable to d/c.  Abdishakur Gottschall, DO [facility] Direct contact: see [email or web address]  7PM-7AM contact night coverage as above 10/02/2020, 2:59 PM  LOS: 1 day

## 2020-10-02 NOTE — H&P (View-Only) (Signed)
[facility] Consultation Note  Referring Provider: [facility] Primary Care Physician:  Celene Squibb, MD  Reason for Consultation:  jaundice  HPI: Robin Guzman is a 86 y.o. female presenting for evaluation of jaundice.  Patient is demented and can provide no history.  Patient has not manifested any signs of abdominal pain.  No fevers or leukocytosis.  CT showed gallstones with suspicion for possible choledocholithiasis.  Past Medical History:  Diagnosis Date  . Arthritis   . CAD (coronary artery disease), native coronary artery    07/27/17 nonobstructive, EF 25-35% by LV gram  . CHF (congestive heart failure) ([place])   . Dementia ([place])   . Diabetes mellitus without complication ([place])   . Dyslipidemia   . Dyspnea   . Dyspnea on exertion   . Gallstone 09/2020  . History of stress test 07/05/2006   High risk scan cardiac cathe was recommended.  [name] Hx of echocardiogram 04/09/2011   EF 55% Mildly hypertrophic left ventricle with normal systolic function, Moderate (grade II) diastolic dysfunction. Elevated left atrial pressure. No significant valvular abnormalities. No pericardial effusion. Mild to moderate pulmonary arterial hypertension.  . Hypercholesteremia   . Hypertension   . Myocardial infarct ([place])   . Skin cancer    HISTORY OF     Past Surgical History:  Procedure Laterality Date  . ABDOMINAL HYSTERECTOMY    . CATARACT EXTRACTION W/PHACO Right 05/20/2017   Procedure: CATARACT EXTRACTION PHACO AND INTRAOCULAR LENS PLACEMENT RIGHT EYE;  Surgeon: Tonny Branch, MD;  Location: [facility];  Service: Ophthalmology;  Laterality: Right;  CDE: 17.77  . CATARACT EXTRACTION W/PHACO Left 06/21/2017   Procedure: CATARACT EXTRACTION PHACO AND INTRAOCULAR LENS PLACEMENT (IOC);  Surgeon: Tonny Branch, MD;  Location: [facility];  Service: Ophthalmology;  Laterality: Left;  CDE: 8.90  . LEFT HEART CATH AND CORONARY ANGIOGRAPHY N/A 07/27/2017   Procedure: LEFT HEART CATH AND CORONARY  ANGIOGRAPHY;  Surgeon: Wellington Hampshire, MD;  Location: [facility];  Service: Cardiovascular;  Laterality: N/A;    Prior to Admission medications   Medication Sig Start Date End Date Taking? Authorizing Provider  acetaminophen (TYLENOL) 500 MG tablet Take 1,000 mg by mouth in the morning and at bedtime.   Yes [provider]  aspirin EC 81 MG tablet Take 1 tablet (81 mg total) by mouth daily with breakfast. 06/25/20  Yes Emokpae, Courage, MD  atorvastatin (LIPITOR) 10 MG tablet Take 10 mg by mouth daily.   Yes [provider]  carvedilol (COREG) 25 MG tablet Take 1 tablet (25 mg total) by mouth 2 (two) times daily with a meal. 10/23/18  Yes Croitoru, Mihai, MD  famotidine (PEPCID) 20 MG tablet Take 20 mg by mouth 2 (two) times daily. 08/05/20  Yes [provider]  ferrous sulfate 325 (65 FE) MG tablet Take 325 mg by mouth daily.   Yes [provider]  HYDROcodone-acetaminophen (NORCO/VICODIN) 5-325 MG tablet Take 0.5 tablets by mouth every 4 (four) hours as needed for moderate pain or severe pain. 06/25/20  Yes Emokpae, Courage, MD  LORazepam (ATIVAN) 0.5 MG tablet Take 1 tablet (0.5 mg total) by mouth every 12 (twelve) hours as needed for anxiety or sleep. 06/25/20 06/25/21 Yes Emokpae, Courage, MD  metFORMIN (GLUCOPHAGE) 500 MG tablet Take 500 mg by mouth 2 (two) times daily with a meal.   Yes [provider]  Multiple Vitamins-Minerals (CENTRUM ADULTS) TABS Take 1 tablet by mouth daily.   Yes [provider]  polyethylene  glycol (MIRALAX) 17 g packet Take 17 g by mouth daily. 06/25/20  Yes Emokpae, Courage, MD  senna-docusate (SENOKOT-S) 8.6-50 MG tablet Take 2 tablets by mouth 2 (two) times daily. 06/25/20  Yes Emokpae, Courage, MD  insulin aspart (NOVOLOG) 100 UNIT/ML FlexPen Inject 0-6 Units into the skin 3 (three) times daily with meals. insulin aspart (novoLOG) injection 0-6 Units 0-6 Units Subcutaneous, 3 times daily with meals CBG < 70:  Implement Hypoglycemia Standing Orders and refer to Hypoglycemia Standing Orders sidebar report  CBG 70 - 120: 0 unit CBG 121 - 150: 0 unit  CBG 151 - 200: 0 unit CBG 201 - 250: 1 units CBG 251 - 300:  2units CBG 301 - 350:  3 units  CBG 351 - 400:  5 units  CBG > 400: 6 units Patient not taking: Reported on 10/01/2020 06/25/20   Roxan Hockey, MD    Current Facility-Administered Medications  Medication Dose Route Frequency Provider Last Rate Last Admin  . 0.9 %  sodium chloride infusion   Intravenous Continuous Elgergawy, Silver Huguenin, MD 75 mL/hr at 10/01/20 2227 New Bag at 10/01/20 2227  . carvedilol (COREG) tablet 25 mg  25 mg Oral BID WC Elgergawy, Silver Huguenin, MD      . HYDROcodone-acetaminophen (NORCO/VICODIN) 5-325 MG per tablet 1-2 tablet  1-2 tablet Oral Q4H PRN Elgergawy, Silver Huguenin, MD      . insulin aspart (novoLOG) injection 0-6 Units  0-6 Units Subcutaneous Q4H Elgergawy, Silver Huguenin, MD      . piperacillin-tazobactam (ZOSYN) IVPB 3.375 g  3.375 g Intravenous Q8H Wendee Beavers, RPH 12.5 mL/hr at 10/02/20 0850 3.375 g at 10/02/20 0850    Allergies as of 10/01/2020 - Review Complete 10/01/2020  Allergen Reaction Noted  . Daypro [oxaprozin] Shortness Of Breath and Other (See Comments) 02/03/2013  . Advil [ibuprofen] Other (See Comments) 02/03/2013    Family History  Problem Relation Age of Onset  . Cancer Mother   . Heart Problems Father   . Cancer Brother        brain  . Cancer Sister        brain    Social History   Socioeconomic History  . Marital status: Widowed    Spouse name: Not on file  . Number of children: Not on file  . Years of education: Not on file  . Highest education level: Not on file  Occupational History  . Not on file  Tobacco Use  . Smoking status: Never Smoker  . Smokeless tobacco: Never Used  Vaping Use  . Vaping Use: Never used  Substance and Sexual Activity  . Alcohol use: No  . Drug use: No  . Sexual activity: Not Currently  Other Topics  Concern  . Not on file  Social History Narrative  . Not on file   Social Determinants of Health   Financial Resource Strain:   . Difficulty of Paying Living Expenses: Not on file  Food Insecurity:   . Worried About Charity fundraiser in the Last Year: Not on file  . Ran Out of Food in the Last Year: Not on file  Transportation Needs:   . Lack of Transportation (Medical): Not on file  . Lack of Transportation (Non-Medical): Not on file  Physical Activity:   . Days of Exercise per Week: Not on file  . Minutes of Exercise per Session: Not on file  Stress:   . Feeling of Stress : Not on file  Social Connections:   .  Frequency of Communication with Friends and Family: Not on file  . Frequency of Social Gatherings with Friends and Family: Not on file  . Attends Religious Services: Not on file  . Active Member of Clubs or Organizations: Not on file  . Attends Archivist Meetings: Not on file  . Marital Status: Not on file  Intimate Partner Violence:   . Fear of Current or Ex-Partner: Not on file  . Emotionally Abused: Not on file  . Physically Abused: Not on file  . Sexually Abused: Not on file    Review of Systems: Unable to obtain due to patient's dementia  Physical Exam: Vital signs in last 24 hours: Temp:  [97.5 F (36.4 C)-98 F (36.7 C)] 97.9 F (36.6 C) (11/11 0950) Pulse Rate:  [61-93] 70 (11/11 0950) Resp:  [0-18] 18 (11/11 0950) BP: (107-153)/(46-92) 135/65 (11/11 0950) SpO2:  [97 %-100 %] 98 % (11/11 0950) Weight:  [66.7 kg] 66.7 kg (11/10 1419)   General:   Alert, can track eyes, can answer some basic questions. Head:  Normocephalic and atraumatic. Eyes:  Sclera icteric bilaterally.   Conjunctiva pink. Ears:  Presbyacusis. Nose:  No deformity, discharge,  or lesions. Mouth:  No deformity or lesions.  Oropharynx pink & moist. Neck:  Supple; no masses or thyromegaly. Abdomen:  Soft, nontender and nondistended. No masses, hepatosplenomegaly or  hernias noted. Normal bowel sounds, without guarding, and without rebound.     Msk:  Symmetrical without gross deformities. Normal posture. Pulses:  Normal pulses noted. Extremities:  Without clubbing or edema. Neurologic:  Demented Skin:  Intact without significant lesions or rashes. Psych:  Alert and cooperative Demented   Lab Results: Recent Labs    10/01/20 1415 10/02/20 0958  WBC 9.7 6.6  HGB 8.4* 8.2*  HCT 26.2* 25.4*  PLT 145* 185   BMET Recent Labs    10/01/20 1415 10/02/20 0958  NA 133* 135  K 4.4 4.5  CL 103 105  CO2 21* 22  GLUCOSE 220* 159*  BUN 21 13  CREATININE 1.01* 1.05*  CALCIUM 8.9 9.0   LFT Recent Labs    10/02/20 0958  PROT 5.6*  ALBUMIN 2.2*  AST 62*  ALT 56*  ALKPHOS 721*  BILITOT 7.4*   PT/INR No results for input(s): LABPROT, INR in the last 72 hours.  Studies/Results: CT ABDOMEN PELVIS W CONTRAST  Result Date: 10/01/2020 CLINICAL DATA:  Mid abdominal pain, dementia, jaundice EXAM: CT ABDOMEN AND PELVIS WITH CONTRAST TECHNIQUE: Multidetector CT imaging of the abdomen and pelvis was performed using the standard protocol following bolus administration of intravenous contrast. CONTRAST:  71mL OMNIPAQUE IOHEXOL 300 MG/ML  SOLN COMPARISON:  06/22/2020 FINDINGS: Lower chest: No acute pleural or parenchymal lung disease. Hepatobiliary: Multiple calcified gallstones are identified. No evidence of gallbladder wall thickening or acute cholecystitis. There is mild intrahepatic biliary duct dilation. No focal liver abnormalities. Common bile duct measures up to 9 mm in diameter, which is grossly normal for age. There is a 9 mm calculus within the downstream common bile duct best seen on image 24 series 3. Pancreas: Unremarkable. No pancreatic ductal dilatation or surrounding inflammatory changes. Spleen: Spleen is enlarged measuring 15.1 cm in craniocaudal length. No focal splenic abnormalities. Adrenals/Urinary Tract: There are bilateral renal cortical  cysts, largest off the lower pole left kidney measuring up to 6.8 cm. Bilateral renal cortical thinning. No nephrolithiasis or obstructive uropathy. The bladder is unremarkable. The adrenals are normal. Stomach/Bowel: No bowel obstruction or ileus. Distal colonic  diverticulosis without diverticulitis. No bowel wall thickening or inflammatory change. Vascular/Lymphatic: Aortic atherosclerosis. No enlarged abdominal or pelvic lymph nodes. Reproductive: Uterus is surgically absent. There is a large cyst within the left adnexa measuring 9.8 x 6.6 x 6.2 cm. No mural nodularity or internal septation. No right adnexal mass. Other: No free fluid or free gas.  No abdominal wall hernia. Musculoskeletal: There is a subacute left anterolateral sixth rib fracture. Chronic L1 compression fracture is again noted. Reconstructed images demonstrate no additional findings. IMPRESSION: 1. Mild intrahepatic biliary duct dilation, with evidence of a 9 mm calculus in the downstream common bile duct. ERCP may be useful. 2. Cholelithiasis without cholecystitis. 3. 9.8 cm left adnexal cyst. Recommend follow-up US in 6-12 months. Note: This recommendation does not apply to premenarchal patients and to those with increased risk (genetic, family history, elevated tumor markers or other high-risk factors) of ovarian cancer. Reference: JACR 2020 Feb; 17(2):248-254 4. Splenomegaly. Electronically Signed   By: Randa Ngo M.D.   On: 10/01/2020 17:14   Impression:  1.  Elevated LFTs, downtrending. 2.  Gallstones. 3.  Suspicion for possible CBD stone on CT scan; I have looked at CT scan and while choledocholithiasis is not excluded, choledocholithiasis on that imaging study is not obvious to me.  Furthermore, she has not abdominal pain (daughter, [name], at bedside, states that her mother typically can manifest pain).  Patient has no fevers or leukocytosis and I am not convinced she has cholangitis. 4.  Dementia. 5.  Cardiac  disease.  Plan:  1.  Given all the variables in this case (elderly patient; demented patient; no manifestation of pain; decreasing LFTs; significant comorbidities), I feel it is not prudent to jump right into ERCP.  Accordingly, we will proceed with MRI/MRCP today.  If MRCP shows choledocholithiasis, then we will plan on ERCP tomorrow. 2.  If ERCP is needed, risks/benefits were reviewed with daughter, [name], at the bedside. 3.  Risks (up to and including bleeding, infection, perforation, pancreatitis that can be complicated by infected necrosis and death), benefits (removal of stones, alleviating blockage, decreasing risk of cholangitis or choledocholithiasis-related pancreatitis), and alternatives (watchful waiting, percutaneous transhepatic cholangiography) of ERCP were explained to patient/family in detail and patient elects to proceed. 4.  Clear liquids today ok, if ERCP needed tomorrow would make patient NPO after midnight. 5.  [facility] will follow.   LOS: 1 day   Grethel Zenk M  10/02/2020, 11:15 AM  Cell [phone] If no answer or after 5 PM call [phone]

## 2020-10-02 NOTE — Consult Note (Signed)
Cardiology Consultation:   Patient ID: Robin Guzman; [id number]; 05-01-1928   Admit date: 10/01/2020 Date of Consult: 10/02/2020  Primary Care Provider: Celene Squibb, MD Primary Cardiologist: Dr. Carlyle Dolly, MD  Patient Profile:   Robin Guzman is a 86 y.o. female with a hx of CAD s/p NSTEMI 07/2017 with LHC showing mild nonobstructive disease, LVEF 25 to 30% with wall motion abnormalities suggesting stress-induced cardiomyopathy and elevated LVEDP. Medical therapy has been limited by soft blood pressures. Also with a hx of PSVT, and hypertension who is being seen today for pre-operative evaluation at the request of Dr. Waldron Labs.   History of Present Illness:   Robin Guzman is a 86 year old female with a history as stated above who presented to [facility] from ALF for the evaluation of jaundice.  HPI obtained through chart review and Robin Guzman who is at bedside given patient's history of dementia.  Patient's family visited at her facility and noted her to be jaundiced with low oxygen levels with lab work significant for elevated bilirubin therefore she was sent to the ED for further evaluation.  On ED presentation, totally bilirubin at 8.3 with AST/ALT elevated at 98/69 with a lipase at 27. Abdominal CT and pelvis was performed which showed mild intrahepatic biliary duct dilation with evidence of a 9 mm calculus in the CBD.  Appears that GI was curb sided with plans for ERCP.  Ms. Mingle follows with Dr. Harl Bowie for her cardiology care. On 07/11/2019 she was seen in the outpatient setting with BP above goal with plans to consider restarting lisinopril however patient wished to continue checking BPs at home and hopes to not restart medication. Echocardiogram was ordered however this was never performed.  Echocardiogram post MI in 2018 showed an LVEF at 25 to 30% with wall motion abnormalities suggesting stress-induced cardiomyopathy.  In this recent follow-up 03/17/2020 she had no  complaints of chest pain however did have occasional dyspnea with a lifting or walking up an incline.  She had no evidence of fluid volume overload at that time.  At baseline, her physical activity has been somewhat limted by a recent fall causing chronic back pain. She was previously living with her Robin Guzman however the physical demand was difficult. 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 Robin Guzman states she has had no new SOB, LE edema or chest pain symptoms. The patient does not have any unstable cardiac conditions. Although no cardiac symptoms, she cannot achieve 4 METs or greater of activity. Given no new HF symptoms, she requires no further cardiac workup prior to her procedure and should be at acceptable risk. Her Revised cardiac risj of peri-procedural MI or cardiac arrest following the procedure is low.    Past Medical History:  Diagnosis Date  . Arthritis   . CAD (coronary artery disease), native coronary artery    07/27/17 nonobstructive, EF 25-35% by LV gram  . CHF (congestive heart failure) ([place])   . Dementia ([place])   . Diabetes mellitus without complication ([place])   . Dyslipidemia   . Dyspnea   . Dyspnea on exertion   . History of stress test 07/05/2006   High risk scan cardiac cathe was recommended.  [name] Hx of echocardiogram 04/09/2011   EF 55% Mildly hypertrophic left ventricle with normal systolic function, Moderate (grade II) diastolic dysfunction. Elevated left atrial pressure. No significant valvular abnormalities. No pericardial effusion. Mild to moderate pulmonary arterial hypertension.  . Hypercholesteremia   . Hypertension   .  Myocardial infarct ([place])   . Skin cancer    HISTORY OF     Past Surgical History:  Procedure Laterality Date  . ABDOMINAL HYSTERECTOMY    . CATARACT EXTRACTION W/PHACO Right 05/20/2017   Procedure: CATARACT EXTRACTION PHACO AND INTRAOCULAR LENS PLACEMENT RIGHT EYE;  Surgeon: Tonny Branch, MD;  Location: [facility];  Service: Ophthalmology;  Laterality: Right;  CDE: 17.77  . CATARACT EXTRACTION W/PHACO Left 06/21/2017   Procedure: CATARACT EXTRACTION PHACO AND INTRAOCULAR LENS PLACEMENT (IOC);  Surgeon: Tonny Branch, MD;  Location: [facility];  Service: Ophthalmology;  Laterality: Left;  CDE: 8.90  . LEFT HEART CATH AND CORONARY ANGIOGRAPHY N/A 07/27/2017   Procedure: LEFT HEART CATH AND CORONARY ANGIOGRAPHY;  Surgeon: Wellington Hampshire, MD;  Location: [facility];  Service: Cardiovascular;  Laterality: N/A;     Prior to Admission medications   Medication Sig Start Date End Date Taking? Authorizing Provider  acetaminophen (TYLENOL) 500 MG tablet Take 1,000 mg by mouth in the morning and at bedtime.   Yes [provider]  aspirin EC 81 MG tablet Take 1 tablet (81 mg total) by mouth daily with breakfast. 06/25/20  Yes Emokpae, Courage, MD  atorvastatin (LIPITOR) 10 MG tablet Take 10 mg by mouth daily.   Yes [provider]  carvedilol (COREG) 25 MG tablet Take 1 tablet (25 mg total) by mouth 2 (two) times daily with a meal. 10/23/18  Yes Croitoru, Mihai, MD  famotidine (PEPCID) 20 MG tablet Take 20 mg by mouth 2 (two) times daily. 08/05/20  Yes [provider]  ferrous sulfate 325 (65 FE) MG tablet Take 325 mg by mouth daily.   Yes [provider]  HYDROcodone-acetaminophen (NORCO/VICODIN) 5-325 MG tablet Take 0.5 tablets by mouth every 4 (four) hours as needed for moderate pain or severe pain. 06/25/20  Yes Emokpae, Courage, MD  LORazepam (ATIVAN) 0.5 MG tablet Take 1 tablet (0.5 mg total) by mouth every 12 (twelve) hours as needed for anxiety or sleep. 06/25/20 06/25/21 Yes Emokpae, Courage, MD  metFORMIN (GLUCOPHAGE) 500 MG tablet Take 500 mg by mouth 2 (two) times daily with a meal.   Yes [provider]  Multiple Vitamins-Minerals (CENTRUM ADULTS) TABS Take 1 tablet by mouth daily.   Yes [provider]  polyethylene glycol (MIRALAX) 17 g packet Take 17 g by  mouth daily. 06/25/20  Yes Emokpae, Courage, MD  senna-docusate (SENOKOT-S) 8.6-50 MG tablet Take 2 tablets by mouth 2 (two) times daily. 06/25/20  Yes Emokpae, Courage, MD  insulin aspart (NOVOLOG) 100 UNIT/ML FlexPen Inject 0-6 Units into the skin 3 (three) times daily with meals. insulin aspart (novoLOG) injection 0-6 Units 0-6 Units Subcutaneous, 3 times daily with meals CBG < 70: Implement Hypoglycemia Standing Orders and refer to Hypoglycemia Standing Orders sidebar report  CBG 70 - 120: 0 unit CBG 121 - 150: 0 unit  CBG 151 - 200: 0 unit CBG 201 - 250: 1 units CBG 251 - 300:  2units CBG 301 - 350:  3 units  CBG 351 - 400:  5 units  CBG > 400: 6 units Patient not taking: Reported on 10/01/2020 06/25/20   Roxan Hockey, MD    Inpatient Medications: Scheduled Meds: . carvedilol  25 mg Oral BID WC  . insulin aspart  0-6 Units Subcutaneous Q4H   Continuous Infusions: . sodium chloride 75 mL/hr at 10/01/20 2227  . piperacillin-tazobactam (ZOSYN)  IV 3.375 g (10/01/20 2321)   PRN Meds: HYDROcodone-acetaminophen  Allergies:  Allergies  Allergen Reactions  . Daypro [Oxaprozin] Shortness Of Breath and Other (See Comments)    weakness  . Advil [Ibuprofen] Other (See Comments)    weakness    Social History:   Social History   Socioeconomic History  . Marital status: Widowed    Spouse name: Not on file  . Number of children: Not on file  . Years of education: Not on file  . Highest education level: Not on file  Occupational History  . Not on file  Tobacco Use  . Smoking status: Never Smoker  . Smokeless tobacco: Never Used  Vaping Use  . Vaping Use: Never used  Substance and Sexual Activity  . Alcohol use: No  . Drug use: No  . Sexual activity: Never  Other Topics Concern  . Not on file  Social History Narrative  . Not on file   Social Determinants of Health   Financial Resource Strain:   . Difficulty of Paying Living Expenses: Not on file  Food Insecurity:   .  Worried About Charity fundraiser in the Last Year: Not on file  . Ran Out of Food in the Last Year: Not on file  Transportation Needs:   . Lack of Transportation (Medical): Not on file  . Lack of Transportation (Non-Medical): Not on file  Physical Activity:   . Days of Exercise per Week: Not on file  . Minutes of Exercise per Session: Not on file  Stress:   . Feeling of Stress : Not on file  Social Connections:   . Frequency of Communication with Friends and Family: Not on file  . Frequency of Social Gatherings with Friends and Family: Not on file  . Attends Religious Services: Not on file  . Active Member of Clubs or Organizations: Not on file  . Attends Archivist Meetings: Not on file  . Marital Status: Not on file  Intimate Partner Violence:   . Fear of Current or Ex-Partner: Not on file  . Emotionally Abused: Not on file  . Physically Abused: Not on file  . Sexually Abused: Not on file    Family History:   Family History  Problem Relation Age of Onset  . Cancer Mother   . Heart Problems Father   . Cancer Brother        brain  . Cancer Sister        brain   Family Status:  Family Status  Relation Name Status  . Mother  Deceased  . Father  Deceased  . Sister  Deceased  . Brother  Deceased  . Brother  (Not Specified)  . Sister  (Not Specified)    ROS:  Please see the history of present illness.  All other ROS reviewed and negative.     Physical Exam/Data:   Vitals:   10/01/20 2330 10/02/20 0100 10/02/20 0236 10/02/20 0457  BP: 138/60 (!) 143/77 (!) 136/59 139/76  Pulse: 68 72 66 74  Resp: 13 18 15 15   Temp:   97.6 F (36.4 C) (!) 97.5 F (36.4 C)  TempSrc:   Oral   SpO2: 98% 97% 99% 100%  Weight:      Height:        Intake/Output Summary (Last 24 hours) at 10/02/2020 0800 Last data filed at 10/02/2020 2952 Gross per 24 hour  Intake 0 ml  Output 13 ml  Net -13 ml   Filed Weights   10/01/20 1419  Weight: 66.7 kg   Body  mass index  is 25.23 kg/m.   General: Elderly, NAD Lungs:Clear to ausculation bilaterally. No wheezes, rales, or rhonchi. Breathing is unlabored. Cardiovascular: RRR with S1 S2. No murmurs Abdomen: Soft, non-tender, distended. No obvious abdominal masses. Extremities: No edema. Radial pulses 2+ bilaterally Neuro: Alert and oriented to person and place, not to situation. No focal deficits. No facial asymmetry. MAE spontaneously. Psych: Responds to questions appropriately with normal affect.    EKG:  The EKG was personally reviewed and demonstrates: 10/01/20 NSR with known LBBB, no acute changes  Telemetry:  Telemetry was personally reviewed and demonstrates: 10/02/20 NSR  Relevant CV Studies:  LHC 07/27/2017:  Mid LAD lesion, 30 %stenosed.  Ost 1st Diag to 1st Diag lesion, 40 %stenosed.  Mid LAD to Dist LAD lesion, 20 %stenosed.  There is severe left ventricular systolic dysfunction.  LV end diastolic pressure is moderately elevated.  The left ventricular ejection fraction is 25-35% by visual estimate.   1. Mild nonobstructive coronary artery disease. 2. Severely reduced LV systolic function with an EF of 25-30% with wall motion abnormality consistent with stress-induced cardiomyopathy. 3. Moderately elevated left ventricular end-diastolic pressure.  Recommendations: Recommend medical therapy for stress-induced cardiomyopathy. I resumed small dose carvedilol and ACE inhibitor. If renal function is stable, consider gentle diuresis. Avoid catheterization via the right radial artery in the future due to significant right radial loop that could not be straightened out with the catheter.  Echo 02/11/2016:  Study Conclusions   - Left ventricle: The cavity size was normal. Wall thickness was  increased in a pattern of mild LVH. The estimated ejection  fraction was 55%. Doppler parameters are consistent with abnormal  left ventricular relaxation (grade 1 diastolic dysfunction).  -  Aortic valve: There was mild regurgitation.  - Atrial septum: No defect or patent foramen ovale was identified.  - Pericardium, extracardiac: Small pericardial effusion with no   Laboratory Data:  Chemistry Recent Labs  Lab 10/01/20 1415  NA 133*  K 4.4  CL 103  CO2 21*  GLUCOSE 220*  BUN 21  CREATININE 1.01*  CALCIUM 8.9  GFRNONAA 53*  ANIONGAP 9    Total Protein  Date Value Ref Range Status  10/01/2020 6.1 (L) 6.5 - 8.1 g/dL Final   Albumin  Date Value Ref Range Status  10/01/2020 2.5 (L) 3.5 - 5.0 g/dL Final   AST  Date Value Ref Range Status  10/01/2020 98 (H) 15 - 41 U/L Final   ALT  Date Value Ref Range Status  10/01/2020 69 (H) 0 - 44 U/L Final   Alkaline Phosphatase  Date Value Ref Range Status  10/01/2020 876 (H) 38 - 126 U/L Final   Total Bilirubin  Date Value Ref Range Status  10/01/2020 8.3 (H) 0.3 - 1.2 mg/dL Final   Hematology Recent Labs  Lab 10/01/20 1415  WBC 9.7  RBC 2.49*  HGB 8.4*  HCT 26.2*  MCV 105.2*  MCH 33.7  MCHC 32.1  RDW 18.1*  PLT 145*   Cardiac EnzymesNo results for input(s): TROPONINI in the last 168 hours. No results for input(s): TROPIPOC in the last 168 hours.  BNPNo results for input(s): BNP, PROBNP in the last 168 hours.  DDimer No results for input(s): DDIMER in the last 168 hours. TSH:  Lab Results  Component Value Date   TSH 1.664 01/26/2016   Lipids:No results found for: CHOL, HDL, LDLCALC, LDLDIRECT, TRIG, CHOLHDL HgbA1c: Lab Results  Component Value Date   HGBA1C 7.1 (H) 06/23/2020  Radiology/Studies:  CT ABDOMEN PELVIS W CONTRAST  Result Date: 10/01/2020 CLINICAL DATA:  Mid abdominal pain, dementia, jaundice EXAM: CT ABDOMEN AND PELVIS WITH CONTRAST TECHNIQUE: Multidetector CT imaging of the abdomen and pelvis was performed using the standard protocol following bolus administration of intravenous contrast. CONTRAST:  70mL OMNIPAQUE IOHEXOL 300 MG/ML  SOLN COMPARISON:  06/22/2020 FINDINGS: Lower  chest: No acute pleural or parenchymal lung disease. Hepatobiliary: Multiple calcified gallstones are identified. No evidence of gallbladder wall thickening or acute cholecystitis. There is mild intrahepatic biliary duct dilation. No focal liver abnormalities. Common bile duct measures up to 9 mm in diameter, which is grossly normal for age. There is a 9 mm calculus within the downstream common bile duct best seen on image 24 series 3. Pancreas: Unremarkable. No pancreatic ductal dilatation or surrounding inflammatory changes. Spleen: Spleen is enlarged measuring 15.1 cm in craniocaudal length. No focal splenic abnormalities. Adrenals/Urinary Tract: There are bilateral renal cortical cysts, largest off the lower pole left kidney measuring up to 6.8 cm. Bilateral renal cortical thinning. No nephrolithiasis or obstructive uropathy. The bladder is unremarkable. The adrenals are normal. Stomach/Bowel: No bowel obstruction or ileus. Distal colonic diverticulosis without diverticulitis. No bowel wall thickening or inflammatory change. Vascular/Lymphatic: Aortic atherosclerosis. No enlarged abdominal or pelvic lymph nodes. Reproductive: Uterus is surgically absent. There is a large cyst within the left adnexa measuring 9.8 x 6.6 x 6.2 cm. No mural nodularity or internal septation. No right adnexal mass. Other: No free fluid or free gas.  No abdominal wall hernia. Musculoskeletal: There is a subacute left anterolateral sixth rib fracture. Chronic L1 compression fracture is again noted. Reconstructed images demonstrate no additional findings. IMPRESSION: 1. Mild intrahepatic biliary duct dilation, with evidence of a 9 mm calculus in the downstream common bile duct. ERCP may be useful. 2. Cholelithiasis without cholecystitis. 3. 9.8 cm left adnexal cyst. Recommend follow-up US in 6-12 months. Note: This recommendation does not apply to premenarchal patients and to those with increased risk (genetic, family history, elevated  tumor markers or other high-risk factors) of ovarian cancer. Reference: JACR 2020 Feb; 17(2):248-254 4. Splenomegaly. Electronically Signed   By: Randa Ngo M.D.   On: 10/01/2020 17:14   Assessment and Plan:   1.  Preoperative cardiac evaluation: -At baseline, her physical activity has been somewhat limted by a recent fall causing chronic back pain. She was previously living with her Robin Guzman however the physical demand was difficult. 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 Robin Guzman states she has had no new SOB, LE edema or chest pain symptoms. The patient does not have any unstable cardiac conditions. Although no cardiac symptoms, she cannot achieve 4 METs or greater of activity. Given no new HF symptoms, she requires no further cardiac workup prior to her procedure and should be at acceptable risk. Her Revised cardiac risk of peri-procedural MI or cardiac arrest following the procedure is low.    2.  CAD s/p NSTEMI 07/2017: -LHC with mild nonobstructive disease with an LVEF at 25 to 30% with wall motion abnormalities suggesting stress-induced cardiomyopathy -GDMT has been limited by soft BPs in the past  -Plan was for follow up echocardiogram in OP follow up which was ordered however never obtained -Would recommend that we follow echo here however does not necessarily need to be performed prior to procedure given the absence of HF symptoms at this time.   3.  Cardiomyopathy: -Per LHC with LVEF at 25 to 30% in 2018 -Well  compensated without evidence of CHF>>see echo plan above  -Continue carvedilol  4.  History of PSVT: -No recent palpitations -Maintaining NSR with no arrhythmias -Continue carvedilol  5.  Hypertension: -Stable, 139/76>>>136/59>>143/77 -Patient has had a history of intermittent dizziness with orthostatic symptoms>> acceptable BPs in the 150s with no other treatment   For questions or updates, please contact [facility] Please consult [email or web address] for contact info under Cardiology/STEMI.   SignedKathyrn Drown NP-C [facility] Pager: [phone] 10/02/2020 8:00 AM

## 2020-10-02 NOTE — Plan of Care (Signed)
  Problem: Education: Goal: Knowledge of General Education information will improve Description: Including pain rating scale, medication(s)/side effects and non-pharmacologic comfort measures Outcome: Progressing   Problem: Health Behavior/Discharge Planning: Goal: Ability to manage health-related needs will improve Outcome: Progressing   Problem: Clinical Measurements: Goal: Ability to maintain clinical measurements within normal limits will improve Outcome: Progressing Goal: Will remain free from infection Outcome: Progressing Goal: Diagnostic test results will improve Outcome: Progressing Goal: Respiratory complications will improve Outcome: Progressing Goal: Cardiovascular complication will be avoided Outcome: Progressing   Problem: Activity: Goal: Risk for activity intolerance will decrease Outcome: Progressing   Problem: Nutrition: Goal: Adequate nutrition will be maintained Outcome: Progressing   Problem: Coping: Goal: Level of anxiety will decrease Outcome: Progressing   Problem: Elimination: Goal: Will not experience complications related to bowel motility Outcome: Progressing Goal: Will not experience complications related to urinary retention Outcome: Progressing   Problem: Pain Managment: Goal: General experience of comfort will improve Outcome: Progressing   Problem: Safety: Goal: Ability to remain free from injury will improve Outcome: Progressing   Problem: Skin Integrity: Goal: Risk for impaired skin integrity will decrease Outcome: Progressing   Problem: Education: Goal: Ability to describe self-care measures that may prevent or decrease complications (Diabetes Survival Skills Education) will improve Outcome: Progressing Goal: Individualized Educational Video(s) Outcome: Progressing   Problem: Coping: Goal: Ability to adjust to condition or change in health will improve Outcome: Progressing   Problem: Fluid Volume: Goal: Ability to  maintain a balanced intake and output will improve Outcome: Progressing   Problem: Health Behavior/Discharge Planning: Goal: Ability to identify and utilize available resources and services will improve Outcome: Progressing Goal: Ability to manage health-related needs will improve Outcome: Progressing   Problem: Metabolic: Goal: Ability to maintain appropriate glucose levels will improve Outcome: Progressing   Problem: Nutritional: Goal: Maintenance of adequate nutrition will improve Outcome: Progressing Goal: Progress toward achieving an optimal weight will improve Outcome: Progressing   Problem: Skin Integrity: Goal: Risk for impaired skin integrity will decrease Outcome: Progressing   Problem: Tissue Perfusion: Goal: Adequacy of tissue perfusion will improve Outcome: Progressing   Problem: Education: Goal: Ability to demonstrate management of disease process will improve Outcome: Progressing Goal: Ability to verbalize understanding of medication therapies will improve Outcome: Progressing Goal: Individualized Educational Video(s) Outcome: Progressing   Problem: Activity: Goal: Capacity to carry out activities will improve Outcome: Progressing   Problem: Cardiac: Goal: Ability to achieve and maintain adequate cardiopulmonary perfusion will improve Outcome: Progressing   

## 2020-10-02 NOTE — Consult Note (Signed)
[facility] Consultation Note  Referring Provider: [facility] Primary Care Physician:  Celene Squibb, MD  Reason for Consultation:  jaundice  HPI: Robin Guzman is a 85 y.o. female presenting for evaluation of jaundice.  Patient is demented and can provide no history.  Patient has not manifested any signs of abdominal pain.  No fevers or leukocytosis.  CT showed gallstones with suspicion for possible choledocholithiasis.  Past Medical History:  Diagnosis Date  . Arthritis   . CAD (coronary artery disease), native coronary artery    07/27/17 nonobstructive, EF 25-35% by LV gram  . CHF (congestive heart failure) ([place])   . Dementia ([place])   . Diabetes mellitus without complication ([place])   . Dyslipidemia   . Dyspnea   . Dyspnea on exertion   . Gallstone 09/2020  . History of stress test 07/05/2006   High risk scan cardiac cathe was recommended.  [name] Hx of echocardiogram 04/09/2011   EF 55% Mildly hypertrophic left ventricle with normal systolic function, Moderate (grade II) diastolic dysfunction. Elevated left atrial pressure. No significant valvular abnormalities. No pericardial effusion. Mild to moderate pulmonary arterial hypertension.  . Hypercholesteremia   . Hypertension   . Myocardial infarct ([place])   . Skin cancer    HISTORY OF     Past Surgical History:  Procedure Laterality Date  . ABDOMINAL HYSTERECTOMY    . CATARACT EXTRACTION W/PHACO Right 05/20/2017   Procedure: CATARACT EXTRACTION PHACO AND INTRAOCULAR LENS PLACEMENT RIGHT EYE;  Surgeon: Tonny Branch, MD;  Location: [facility];  Service: Ophthalmology;  Laterality: Right;  CDE: 17.77  . CATARACT EXTRACTION W/PHACO Left 06/21/2017   Procedure: CATARACT EXTRACTION PHACO AND INTRAOCULAR LENS PLACEMENT (IOC);  Surgeon: Tonny Branch, MD;  Location: [facility];  Service: Ophthalmology;  Laterality: Left;  CDE: 8.90  . LEFT HEART CATH AND CORONARY ANGIOGRAPHY N/A 07/27/2017   Procedure: LEFT HEART CATH AND CORONARY  ANGIOGRAPHY;  Surgeon: Wellington Hampshire, MD;  Location: [facility];  Service: Cardiovascular;  Laterality: N/A;    Prior to Admission medications   Medication Sig Start Date End Date Taking? Authorizing Provider  acetaminophen (TYLENOL) 500 MG tablet Take 1,000 mg by mouth in the morning and at bedtime.   Yes [provider]  aspirin EC 81 MG tablet Take 1 tablet (81 mg total) by mouth daily with breakfast. 06/25/20  Yes Emokpae, Courage, MD  atorvastatin (LIPITOR) 10 MG tablet Take 10 mg by mouth daily.   Yes [provider]  carvedilol (COREG) 25 MG tablet Take 1 tablet (25 mg total) by mouth 2 (two) times daily with a meal. 10/23/18  Yes Croitoru, Mihai, MD  famotidine (PEPCID) 20 MG tablet Take 20 mg by mouth 2 (two) times daily. 08/05/20  Yes [provider]  ferrous sulfate 325 (65 FE) MG tablet Take 325 mg by mouth daily.   Yes [provider]  HYDROcodone-acetaminophen (NORCO/VICODIN) 5-325 MG tablet Take 0.5 tablets by mouth every 4 (four) hours as needed for moderate pain or severe pain. 06/25/20  Yes Emokpae, Courage, MD  LORazepam (ATIVAN) 0.5 MG tablet Take 1 tablet (0.5 mg total) by mouth every 12 (twelve) hours as needed for anxiety or sleep. 06/25/20 06/25/21 Yes Emokpae, Courage, MD  metFORMIN (GLUCOPHAGE) 500 MG tablet Take 500 mg by mouth 2 (two) times daily with a meal.   Yes [provider]  Multiple Vitamins-Minerals (CENTRUM ADULTS) TABS Take 1 tablet by mouth daily.   Yes [provider]  polyethylene  glycol (MIRALAX) 17 g packet Take 17 g by mouth daily. 06/25/20  Yes Emokpae, Courage, MD  senna-docusate (SENOKOT-S) 8.6-50 MG tablet Take 2 tablets by mouth 2 (two) times daily. 06/25/20  Yes Emokpae, Courage, MD  insulin aspart (NOVOLOG) 100 UNIT/ML FlexPen Inject 0-6 Units into the skin 3 (three) times daily with meals. insulin aspart (novoLOG) injection 0-6 Units 0-6 Units Subcutaneous, 3 times daily with meals CBG < 70:  Implement Hypoglycemia Standing Orders and refer to Hypoglycemia Standing Orders sidebar report  CBG 70 - 120: 0 unit CBG 121 - 150: 0 unit  CBG 151 - 200: 0 unit CBG 201 - 250: 1 units CBG 251 - 300:  2units CBG 301 - 350:  3 units  CBG 351 - 400:  5 units  CBG > 400: 6 units Patient not taking: Reported on 10/01/2020 06/25/20   Roxan Hockey, MD    Current Facility-Administered Medications  Medication Dose Route Frequency Provider Last Rate Last Admin  . 0.9 %  sodium chloride infusion   Intravenous Continuous Elgergawy, Silver Huguenin, MD 75 mL/hr at 10/01/20 2227 New Bag at 10/01/20 2227  . carvedilol (COREG) tablet 25 mg  25 mg Oral BID WC Elgergawy, Silver Huguenin, MD      . HYDROcodone-acetaminophen (NORCO/VICODIN) 5-325 MG per tablet 1-2 tablet  1-2 tablet Oral Q4H PRN Elgergawy, Silver Huguenin, MD      . insulin aspart (novoLOG) injection 0-6 Units  0-6 Units Subcutaneous Q4H Elgergawy, Silver Huguenin, MD      . piperacillin-tazobactam (ZOSYN) IVPB 3.375 g  3.375 g Intravenous Q8H Wendee Beavers, RPH 12.5 mL/hr at 10/02/20 0850 3.375 g at 10/02/20 0850    Allergies as of 10/01/2020 - Review Complete 10/01/2020  Allergen Reaction Noted  . Daypro [oxaprozin] Shortness Of Breath and Other (See Comments) 02/03/2013  . Advil [ibuprofen] Other (See Comments) 02/03/2013    Family History  Problem Relation Age of Onset  . Cancer Mother   . Heart Problems Father   . Cancer Brother        brain  . Cancer Sister        brain    Social History   Socioeconomic History  . Marital status: Widowed    Spouse name: Not on file  . Number of children: Not on file  . Years of education: Not on file  . Highest education level: Not on file  Occupational History  . Not on file  Tobacco Use  . Smoking status: Never Smoker  . Smokeless tobacco: Never Used  Vaping Use  . Vaping Use: Never used  Substance and Sexual Activity  . Alcohol use: No  . Drug use: No  . Sexual activity: Not Currently  Other Topics  Concern  . Not on file  Social History Narrative  . Not on file   Social Determinants of Health   Financial Resource Strain:   . Difficulty of Paying Living Expenses: Not on file  Food Insecurity:   . Worried About Charity fundraiser in the Last Year: Not on file  . Ran Out of Food in the Last Year: Not on file  Transportation Needs:   . Lack of Transportation (Medical): Not on file  . Lack of Transportation (Non-Medical): Not on file  Physical Activity:   . Days of Exercise per Week: Not on file  . Minutes of Exercise per Session: Not on file  Stress:   . Feeling of Stress : Not on file  Social Connections:   .  Frequency of Communication with Friends and Family: Not on file  . Frequency of Social Gatherings with Friends and Family: Not on file  . Attends Religious Services: Not on file  . Active Member of Clubs or Organizations: Not on file  . Attends Archivist Meetings: Not on file  . Marital Status: Not on file  Intimate Partner Violence:   . Fear of Current or Ex-Partner: Not on file  . Emotionally Abused: Not on file  . Physically Abused: Not on file  . Sexually Abused: Not on file    Review of Systems: Unable to obtain due to patient's dementia  Physical Exam: Vital signs in last 24 hours: Temp:  [97.5 F (36.4 C)-98 F (36.7 C)] 97.9 F (36.6 C) (11/11 0950) Pulse Rate:  [61-93] 70 (11/11 0950) Resp:  [0-18] 18 (11/11 0950) BP: (107-153)/(46-92) 135/65 (11/11 0950) SpO2:  [97 %-100 %] 98 % (11/11 0950) Weight:  [66.7 kg] 66.7 kg (11/10 1419)   General:   Alert, can track eyes, can answer some basic questions. Head:  Normocephalic and atraumatic. Eyes:  Sclera icteric bilaterally.   Conjunctiva pink. Ears:  Presbyacusis. Nose:  No deformity, discharge,  or lesions. Mouth:  No deformity or lesions.  Oropharynx pink & moist. Neck:  Supple; no masses or thyromegaly. Abdomen:  Soft, nontender and nondistended. No masses, hepatosplenomegaly or  hernias noted. Normal bowel sounds, without guarding, and without rebound.     Msk:  Symmetrical without gross deformities. Normal posture. Pulses:  Normal pulses noted. Extremities:  Without clubbing or edema. Neurologic:  Demented Skin:  Intact without significant lesions or rashes. Psych:  Alert and cooperative Demented   Lab Results: Recent Labs    10/01/20 1415 10/02/20 0958  WBC 9.7 6.6  HGB 8.4* 8.2*  HCT 26.2* 25.4*  PLT 145* 185   BMET Recent Labs    10/01/20 1415 10/02/20 0958  NA 133* 135  K 4.4 4.5  CL 103 105  CO2 21* 22  GLUCOSE 220* 159*  BUN 21 13  CREATININE 1.01* 1.05*  CALCIUM 8.9 9.0   LFT Recent Labs    10/02/20 0958  PROT 5.6*  ALBUMIN 2.2*  AST 62*  ALT 56*  ALKPHOS 721*  BILITOT 7.4*   PT/INR No results for input(s): LABPROT, INR in the last 72 hours.  Studies/Results: CT ABDOMEN PELVIS W CONTRAST  Result Date: 10/01/2020 CLINICAL DATA:  Mid abdominal pain, dementia, jaundice EXAM: CT ABDOMEN AND PELVIS WITH CONTRAST TECHNIQUE: Multidetector CT imaging of the abdomen and pelvis was performed using the standard protocol following bolus administration of intravenous contrast. CONTRAST:  36mL OMNIPAQUE IOHEXOL 300 MG/ML  SOLN COMPARISON:  06/22/2020 FINDINGS: Lower chest: No acute pleural or parenchymal lung disease. Hepatobiliary: Multiple calcified gallstones are identified. No evidence of gallbladder wall thickening or acute cholecystitis. There is mild intrahepatic biliary duct dilation. No focal liver abnormalities. Common bile duct measures up to 9 mm in diameter, which is grossly normal for age. There is a 9 mm calculus within the downstream common bile duct best seen on image 24 series 3. Pancreas: Unremarkable. No pancreatic ductal dilatation or surrounding inflammatory changes. Spleen: Spleen is enlarged measuring 15.1 cm in craniocaudal length. No focal splenic abnormalities. Adrenals/Urinary Tract: There are bilateral renal cortical  cysts, largest off the lower pole left kidney measuring up to 6.8 cm. Bilateral renal cortical thinning. No nephrolithiasis or obstructive uropathy. The bladder is unremarkable. The adrenals are normal. Stomach/Bowel: No bowel obstruction or ileus. Distal colonic  diverticulosis without diverticulitis. No bowel wall thickening or inflammatory change. Vascular/Lymphatic: Aortic atherosclerosis. No enlarged abdominal or pelvic lymph nodes. Reproductive: Uterus is surgically absent. There is a large cyst within the left adnexa measuring 9.8 x 6.6 x 6.2 cm. No mural nodularity or internal septation. No right adnexal mass. Other: No free fluid or free gas.  No abdominal wall hernia. Musculoskeletal: There is a subacute left anterolateral sixth rib fracture. Chronic L1 compression fracture is again noted. Reconstructed images demonstrate no additional findings. IMPRESSION: 1. Mild intrahepatic biliary duct dilation, with evidence of a 9 mm calculus in the downstream common bile duct. ERCP may be useful. 2. Cholelithiasis without cholecystitis. 3. 9.8 cm left adnexal cyst. Recommend follow-up US in 6-12 months. Note: This recommendation does not apply to premenarchal patients and to those with increased risk (genetic, family history, elevated tumor markers or other high-risk factors) of ovarian cancer. Reference: JACR 2020 Feb; 17(2):248-254 4. Splenomegaly. Electronically Signed   By: Randa Ngo M.D.   On: 10/01/2020 17:14   Impression:  1.  Elevated LFTs, downtrending. 2.  Gallstones. 3.  Suspicion for possible CBD stone on CT scan; I have looked at CT scan and while choledocholithiasis is not excluded, choledocholithiasis on that imaging study is not obvious to me.  Furthermore, she has not abdominal pain (daughter, Robin Guzman, at bedside, states that her mother typically can manifest pain).  Patient has no fevers or leukocytosis and I am not convinced she has cholangitis. 4.  Dementia. 5.  Cardiac  disease.  Plan:  1.  Given all the variables in this case (elderly patient; demented patient; no manifestation of pain; decreasing LFTs; significant comorbidities), I feel it is not prudent to jump right into ERCP.  Accordingly, we will proceed with MRI/MRCP today.  If MRCP shows choledocholithiasis, then we will plan on ERCP tomorrow. 2.  If ERCP is needed, risks/benefits were reviewed with daughter, Robin Guzman, at the bedside. 3.  Risks (up to and including bleeding, infection, perforation, pancreatitis that can be complicated by infected necrosis and death), benefits (removal of stones, alleviating blockage, decreasing risk of cholangitis or choledocholithiasis-related pancreatitis), and alternatives (watchful waiting, percutaneous transhepatic cholangiography) of ERCP were explained to patient/family in detail and patient elects to proceed. 4.  Clear liquids today ok, if ERCP needed tomorrow would make patient NPO after midnight. 5.  [facility] will follow.   LOS: 1 day   Zhyon Antenucci M  10/02/2020, 11:15 AM  Cell [phone] If no answer or after 5 PM call [phone]

## 2020-10-02 NOTE — Progress Notes (Signed)
Patient extremely confused this morning, trying to pull out her IV line and getting out of bed constantly. Patient at risk for falling. Several attempts to redirect her proved futile. MD paged for orders for safety sitter.

## 2020-10-03 ENCOUNTER — Inpatient Hospital Stay (HOSPITAL_COMMUNITY): Payer: Medicare Other

## 2020-10-03 ENCOUNTER — Encounter (HOSPITAL_COMMUNITY): Payer: Self-pay | Admitting: Internal Medicine

## 2020-10-03 ENCOUNTER — Inpatient Hospital Stay (HOSPITAL_COMMUNITY): Payer: Medicare Other | Admitting: Certified Registered Nurse Anesthetist

## 2020-10-03 ENCOUNTER — Encounter (HOSPITAL_COMMUNITY)
Admission: EM | Disposition: A | Discharge status: Skilled Nursing Facility | Payer: Self-pay | Source: Skilled Nursing Facility | Attending: Internal Medicine

## 2020-10-03 DIAGNOSIS — R17 Unspecified jaundice: Secondary | ICD-10-CM | POA: Diagnosis not present

## 2020-10-03 DIAGNOSIS — I428 Other cardiomyopathies: Secondary | ICD-10-CM

## 2020-10-03 DIAGNOSIS — I700 Atherosclerosis of aorta: Secondary | ICD-10-CM | POA: Diagnosis not present

## 2020-10-03 DIAGNOSIS — I351 Nonrheumatic aortic (valve) insufficiency: Secondary | ICD-10-CM | POA: Diagnosis not present

## 2020-10-03 DIAGNOSIS — K8050 Calculus of bile duct without cholangitis or cholecystitis without obstruction: Secondary | ICD-10-CM | POA: Diagnosis not present

## 2020-10-03 DIAGNOSIS — M545 Low back pain, unspecified: Secondary | ICD-10-CM | POA: Diagnosis not present

## 2020-10-03 HISTORY — PX: SPHINCTEROTOMY: SHX5279

## 2020-10-03 HISTORY — PX: REMOVAL OF STONES: SHX5545

## 2020-10-03 HISTORY — PX: ERCP: SHX5425

## 2020-10-03 LAB — COMPREHENSIVE METABOLIC PANEL
ALT: 52 U/L — ABNORMAL HIGH (ref 0–44)
AST: 72 U/L — ABNORMAL HIGH (ref 15–41)
Albumin: 1.9 g/dL — ABNORMAL LOW (ref 3.5–5.0)
Alkaline Phosphatase: 670 U/L — ABNORMAL HIGH (ref 38–126)
Anion gap: 7 (ref 5–15)
BUN: 11 mg/dL (ref 8–23)
CO2: 22 mmol/L (ref 22–32)
Calcium: 8.8 mg/dL — ABNORMAL LOW (ref 8.9–10.3)
Chloride: 110 mmol/L (ref 98–111)
Creatinine, Ser: 1.07 mg/dL — ABNORMAL HIGH (ref 0.44–1.00)
GFR, Estimated: 49 mL/min — ABNORMAL LOW (ref >60)
Glucose, Bld: 143 mg/dL — ABNORMAL HIGH (ref 70–99)
Potassium: 4.4 mmol/L (ref 3.5–5.1)
Sodium: 139 mmol/L (ref 135–145)
Total Bilirubin: 7.5 mg/dL — ABNORMAL HIGH (ref 0.3–1.2)
Total Protein: 5.1 g/dL — ABNORMAL LOW (ref 6.5–8.1)

## 2020-10-03 LAB — CBC WITH DIFFERENTIAL/PLATELET
Abs Immature Granulocytes: 0.03 10*3/uL (ref 0.00–0.07)
Basophils Absolute: 0 10*3/uL (ref 0.0–0.1)
Basophils Relative: 1 %
Eosinophils Absolute: 0.1 10*3/uL (ref 0.0–0.5)
Eosinophils Relative: 1 %
HCT: 23.4 % — ABNORMAL LOW (ref 36.0–46.0)
Hemoglobin: 7.4 g/dL — ABNORMAL LOW (ref 12.0–15.0)
Immature Granulocytes: 1 %
Lymphocytes Relative: 9 %
Lymphs Abs: 0.5 10*3/uL — ABNORMAL LOW (ref 0.7–4.0)
MCH: 32.9 pg (ref 26.0–34.0)
MCHC: 31.6 g/dL (ref 30.0–36.0)
MCV: 104 fL — ABNORMAL HIGH (ref 80.0–100.0)
Monocytes Absolute: 0.4 10*3/uL (ref 0.1–1.0)
Monocytes Relative: 7 %
Neutro Abs: 4.8 10*3/uL (ref 1.7–7.7)
Neutrophils Relative %: 81 %
Platelets: 148 10*3/uL — ABNORMAL LOW (ref 150–400)
RBC: 2.25 MIL/uL — ABNORMAL LOW (ref 3.87–5.11)
RDW: 18.6 % — ABNORMAL HIGH (ref 11.5–15.5)
WBC: 5.8 10*3/uL (ref 4.0–10.5)
nRBC: 0 % (ref 0.0–0.2)

## 2020-10-03 LAB — GLUCOSE, CAPILLARY
Glucose-Capillary: 132 mg/dL — ABNORMAL HIGH (ref 70–99)
Glucose-Capillary: 142 mg/dL — ABNORMAL HIGH (ref 70–99)
Glucose-Capillary: 146 mg/dL — ABNORMAL HIGH (ref 70–99)
Glucose-Capillary: 152 mg/dL — ABNORMAL HIGH (ref 70–99)
Glucose-Capillary: 155 mg/dL — ABNORMAL HIGH (ref 70–99)
Glucose-Capillary: 173 mg/dL — ABNORMAL HIGH (ref 70–99)

## 2020-10-03 LAB — ECHOCARDIOGRAM COMPLETE
Area-P 1/2: 2.24 cm2
Height: 64 in
P 1/2 time: 433 msec
S' Lateral: 2.7 cm
Weight: 2352 oz

## 2020-10-03 SURGERY — ERCP, WITH INTERVENTION IF INDICATED
Anesthesia: General

## 2020-10-03 MED ORDER — DEXAMETHASONE SODIUM PHOSPHATE 10 MG/ML IJ SOLN
INTRAMUSCULAR | Status: DC | PRN
  Administered 2020-10-03: 10 mg via INTRAVENOUS

## 2020-10-03 MED ORDER — INDOMETHACIN 50 MG RE SUPP
RECTAL | Status: DC | PRN
  Administered 2020-10-03: 100 mg via RECTAL

## 2020-10-03 MED ORDER — GLUCAGON HCL RDNA (DIAGNOSTIC) 1 MG IJ SOLR
INTRAMUSCULAR | Status: AC
  Filled 2020-10-03: qty 1

## 2020-10-03 MED ORDER — INDOMETHACIN 50 MG RE SUPP
RECTAL | Status: AC
  Filled 2020-10-03: qty 2

## 2020-10-03 MED ORDER — CIPROFLOXACIN IN D5W 400 MG/200ML IV SOLN
INTRAVENOUS | Status: AC
  Filled 2020-10-03: qty 200

## 2020-10-03 MED ORDER — SODIUM CHLORIDE 0.9 % IV SOLN
INTRAVENOUS | Status: DC | PRN
  Administered 2020-10-03: 20 mL

## 2020-10-03 MED ORDER — ONDANSETRON HCL 4 MG/2ML IJ SOLN
INTRAMUSCULAR | Status: DC | PRN
  Administered 2020-10-03: 4 mg via INTRAVENOUS

## 2020-10-03 MED ORDER — ROCURONIUM BROMIDE 10 MG/ML (PF) SYRINGE
PREFILLED_SYRINGE | INTRAVENOUS | Status: DC | PRN
  Administered 2020-10-03: 70 mg via INTRAVENOUS

## 2020-10-03 MED ORDER — LIDOCAINE 2% (20 MG/ML) 5 ML SYRINGE
INTRAMUSCULAR | Status: DC | PRN
  Administered 2020-10-03: 60 mg via INTRAVENOUS

## 2020-10-03 MED ORDER — PHENYLEPHRINE HCL-NACL 10-0.9 MG/250ML-% IV SOLN
INTRAVENOUS | Status: DC | PRN
  Administered 2020-10-03: 20 ug/min via INTRAVENOUS

## 2020-10-03 MED ORDER — PIPERACILLIN-TAZOBACTAM 3.375 G IVPB
INTRAVENOUS | Status: AC
  Filled 2020-10-03: qty 50

## 2020-10-03 MED ORDER — SUGAMMADEX SODIUM 200 MG/2ML IV SOLN
INTRAVENOUS | Status: DC | PRN
  Administered 2020-10-03: 200 mg via INTRAVENOUS

## 2020-10-03 MED ORDER — PROPOFOL 10 MG/ML IV BOLUS
INTRAVENOUS | Status: DC | PRN
  Administered 2020-10-03: 100 mg via INTRAVENOUS

## 2020-10-03 MED ORDER — HALOPERIDOL LACTATE 5 MG/ML IJ SOLN
2.0000 mg | Freq: Once | INTRAMUSCULAR | Status: AC | PRN
  Administered 2020-10-03: 2 mg via INTRAMUSCULAR
  Filled 2020-10-03: qty 1

## 2020-10-03 MED ORDER — FENTANYL CITRATE (PF) 100 MCG/2ML IJ SOLN
INTRAMUSCULAR | Status: DC | PRN
  Administered 2020-10-03 (×2): 50 ug via INTRAVENOUS

## 2020-10-03 MED ORDER — LACTATED RINGERS IV SOLN: INTRAVENOUS | Status: DC | PRN

## 2020-10-03 NOTE — Progress Notes (Signed)
  Echocardiogram 2D Echocardiogram has been performed.  Robin Guzman 10/03/2020, 10:33 AM

## 2020-10-03 NOTE — Progress Notes (Signed)
Called patient's daughter, Alberteen Sam informing her of patient's agitation early morning, OOB, removing compression wrap around IV catheter site, pulling her IV catheter, biting and kicking NT and RNs resulting to skin tears of both ecchymotic hands which were cleansed and foam applied.    Informed pt's daughter that no safety sitter will be available during the day shift and would be helpful if a family member can come early to pacify patient in case similar incident happens.  As of this writing, patient allowed phlebotomist to draw blood and then ambulated to bathroom to void with the NT assisting.  Patient calm now and went back to bed.  Will monitor.

## 2020-10-03 NOTE — Interval H&P Note (Signed)
History and Physical Interval Note:  10/03/2020 11:33 AM  Robin Guzman  has presented today for surgery, with the diagnosis of choledocholithiasis.  The various methods of treatment have been discussed with the patient and family. After consideration of risks, benefits and other options for treatment, the patient has consented to  Procedure(s): ENDOSCOPIC RETROGRADE CHOLANGIOPANCREATOGRAPHY (ERCP) (N/A) as a surgical intervention.  The patient's history has been reviewed, patient examined, no change in status, stable for surgery.  I have reviewed the patient's chart and labs.  Questions were answered to the patient's satisfaction.     Robin Guzman M  Assessment:   Bile duct stone confirmed on MRCP.  Plan:   ERCP.  Risks/benefits reviewed yesterday with daughter Robin Guzman and today with daughter Robin Guzman. Risks (up to and including bleeding, infection, perforation, pancreatitis that can be complicated by infected necrosis and death), benefits (removal of stones, alleviating blockage, decreasing risk of cholangitis or choledocholithiasis-related pancreatitis), and alternatives (watchful waiting, percutaneous transhepatic cholangiography) of ERCP were explained to patient/family in detail and patient elects to proceed.

## 2020-10-03 NOTE — Progress Notes (Signed)
Received from [facility]  Patient alert and oriented x1  Patient awake and trying to get out of bed and being verbally threatening to staff stating "I will punch you in the stomach." Patient's daughter was called to inform her that she will be taken down for her procedure at 7, daughter stated that her sister will be the one to be with her today and that she will stay with her tonight and she will call sister to inform her about procedure time.  IV infusing.  Bedside table and call bell in reach for patient safety.

## 2020-10-03 NOTE — Anesthesia Preprocedure Evaluation (Signed)
Anesthesia Evaluation  Patient identified by MRN, date of birth, ID band Patient awake    Reviewed: Allergy & Precautions, NPO status , Patient's Chart, lab work & pertinent test results  Airway Mallampati: II  TM Distance: >3 FB Neck ROM: Full    Dental no notable dental hx.    Pulmonary shortness of breath,    Pulmonary exam normal breath sounds clear to auscultation       Cardiovascular hypertension, Pt. on medications + CAD, + Past MI and +CHF  Normal cardiovascular exam+ dysrhythmias  Rhythm:Regular Rate:Normal     Neuro/Psych Dementia negative neurological ROS  negative psych ROS   GI/Hepatic negative GI ROS, Neg liver ROS,   Endo/Other  negative endocrine ROSdiabetes  Renal/GU negative Renal ROS  negative genitourinary   Musculoskeletal  (+) Arthritis , Osteoarthritis,    Abdominal   Peds negative pediatric ROS (+)  Hematology negative hematology ROS (+)   Anesthesia Other Findings   Reproductive/Obstetrics negative OB ROS                             Anesthesia Physical Anesthesia Plan  ASA: III  Anesthesia Plan: General   Post-op Pain Management:    Induction: Intravenous  PONV Risk Score and Plan: 3 and Ondansetron, Dexamethasone, Midazolam and Treatment may vary due to age or medical condition  Airway Management Planned: Oral ETT  Additional Equipment:   Intra-op Plan:   Post-operative Plan: Extubation in OR  Informed Consent: I have reviewed the patients History and Physical, chart, labs and discussed the procedure including the risks, benefits and alternatives for the proposed anesthesia with the patient or authorized representative who has indicated his/her understanding and acceptance.     Dental advisory given  Plan Discussed with: CRNA  Anesthesia Plan Comments:         Anesthesia Quick Evaluation

## 2020-10-03 NOTE — Consult Note (Signed)
Robin Guzman 1928/08/18  [id number].    Requesting MD: Dr. Paulita Fujita Chief Complaint/Reason for Consult: Choledocholithiasis  HPI: Robin Guzman is a 86 y.o. female with a history of CAD s/p NSTEMI 07/2017, CHF (55-60% on 10/03/20), HTN, HLD, DM2, and dementia who presented to the hospital on 11/10 for jaundice.  Patient presented via EMS from [facility] with jaundice after family visited her facility. She was recently seen by her PCP and found to have elevated LFTs 3 weeks ago.  She was supposed to follow up next week for a repeat lab test.  Patient denied any complaints on presentation.  She underwent work-up and was found to have elevated LFTs and choledocholithiasis on CT.   Patient was admitted to hospitalist.  GI and cardiology were consulted.  Patient underwent ERCP today by Dr. Paulita Fujita who successfully remove stones and performed a biliary sphincterotomy.  The patient is sleeping from her sedation.  Her daughter, Robin Guzman, provided all her history.  To her knowledge, she never had abdominal pain, N/V.  She did have some diarrhea she thinks.  She is mostly wheelchair bound.  She fell in August and "broke her back" and has been in ALF since then.  She has lost quite a bit of weight since that time.  The daughter says as best asn they know she eats.  When asked the patient usually tells her kids "I eat what I want."  She does have progression of her dementia.  She recognizes her kids, but does not always recognize people who see her more infrequently.  She is becoming overall more forgetful.  We were asked to see.   Patient just returned from ERCP.  Prior abdominal surgeries include abdominal hysterectomy.   ROS: Review of Systems  Unable to perform ROS: Dementia  and sedated from ERCP  Family History  Problem Relation Age of Onset  . Cancer Mother   . Heart Problems Father   . Cancer Brother        brain  . Cancer Sister        brain    Past Medical  History:  Diagnosis Date  . Arthritis   . CAD (coronary artery disease), native coronary artery    07/27/17 nonobstructive, EF 25-35% by LV gram  . CHF (congestive heart failure) ([place])   . Dementia ([place])   . Diabetes mellitus without complication ([place])   . Dyslipidemia   . Dyspnea   . Dyspnea on exertion   . Gallstone 09/2020  . History of stress test 07/05/2006   High risk scan cardiac cathe was recommended.  [name] Hx of echocardiogram 04/09/2011   EF 55% Mildly hypertrophic left ventricle with normal systolic function, Moderate (grade II) diastolic dysfunction. Elevated left atrial pressure. No significant valvular abnormalities. No pericardial effusion. Mild to moderate pulmonary arterial hypertension.  . Hypercholesteremia   . Hypertension   . Myocardial infarct ([place])   . Skin cancer    HISTORY OF     Past Surgical History:  Procedure Laterality Date  . ABDOMINAL HYSTERECTOMY    . CATARACT EXTRACTION W/PHACO Right 05/20/2017   Procedure: CATARACT EXTRACTION PHACO AND INTRAOCULAR LENS PLACEMENT RIGHT EYE;  Surgeon: Tonny Branch, MD;  Location: [facility];  Service: Ophthalmology;  Laterality: Right;  CDE: 17.77  . CATARACT EXTRACTION W/PHACO Left 06/21/2017   Procedure: CATARACT EXTRACTION PHACO AND INTRAOCULAR LENS PLACEMENT (IOC);  Surgeon: Tonny Branch, MD;  Location: [facility];  Service: Ophthalmology;  Laterality: Left;  CDE: 8.90  . LEFT HEART CATH AND CORONARY ANGIOGRAPHY N/A 07/27/2017   Procedure: LEFT HEART CATH AND CORONARY ANGIOGRAPHY;  Surgeon: [place] Hampshire, MD;  Location: [facility];  Service: Cardiovascular;  Laterality: N/A;    Social History:  reports that she has never smoked. She has never used smokeless tobacco. She reports that she does not drink alcohol and does not use drugs.  Allergies:  Allergies  Allergen Reactions  . Daypro [Oxaprozin] Shortness Of Breath and Other (See Comments)    weakness  . Advil [Ibuprofen] Other (See Comments)    weakness     Medications Prior to Admission  Medication Sig Dispense Refill  . acetaminophen (TYLENOL) 500 MG tablet Take 1,000 mg by mouth in the morning and at bedtime.    [name] aspirin EC 81 MG tablet Take 1 tablet (81 mg total) by mouth daily with breakfast. 30 tablet 11  . atorvastatin (LIPITOR) 10 MG tablet Take 10 mg by mouth daily.    . carvedilol (COREG) 25 MG tablet Take 1 tablet (25 mg total) by mouth 2 (two) times daily with a meal. 180 tablet 3  . famotidine (PEPCID) 20 MG tablet Take 20 mg by mouth 2 (two) times daily.    . ferrous sulfate 325 (65 FE) MG tablet Take 325 mg by mouth daily.    [name] HYDROcodone-acetaminophen (NORCO/VICODIN) 5-325 MG tablet Take 0.5 tablets by mouth every 4 (four) hours as needed for moderate pain or severe pain. 15 tablet 0  . LORazepam (ATIVAN) 0.5 MG tablet Take 1 tablet (0.5 mg total) by mouth every 12 (twelve) hours as needed for anxiety or sleep. 10 tablet 0  . metFORMIN (GLUCOPHAGE) 500 MG tablet Take 500 mg by mouth 2 (two) times daily with a meal.    . Multiple Vitamins-Minerals (CENTRUM ADULTS) TABS Take 1 tablet by mouth daily.    . polyethylene glycol (MIRALAX) 17 g packet Take 17 g by mouth daily. 30 each 0  . senna-docusate (SENOKOT-S) 8.6-50 MG tablet Take 2 tablets by mouth 2 (two) times daily. 120 tablet 2  . insulin aspart (NOVOLOG) 100 UNIT/ML FlexPen Inject 0-6 Units into the skin 3 (three) times daily with meals. insulin aspart (novoLOG) injection 0-6 Units 0-6 Units Subcutaneous, 3 times daily with meals CBG < 70: Implement Hypoglycemia Standing Orders and refer to Hypoglycemia Standing Orders sidebar report  CBG 70 - 120: 0 unit CBG 121 - 150: 0 unit  CBG 151 - 200: 0 unit CBG 201 - 250: 1 units CBG 251 - 300:  2units CBG 301 - 350:  3 units  CBG 351 - 400:  5 units  CBG > 400: 6 units (Patient not taking: Reported on 10/01/2020) 15 mL 11     Physical Exam: Blood pressure (!) 145/72, pulse 62, temperature 97.8 F (36.6 C), temperature source  Axillary, resp. rate 17, height 5\' 4"  (1.626 m), weight 66.7 kg, SpO2 98 %. General: elderly white female who is laying in bed in NAD, sleeping HEENT: head is normocephalic, atraumatic.  Scleral icterus noted when she opened her eyes briefly.  PERRL.  Ears and nose without any masses or lesions.  Mouth closed and doesn't open Heart: regular, rate, and rhythm.  Normal s1,s2. No obvious murmurs, gallops, or rubs noted.  Palpable pedal pulses bilaterally  Lungs: CTAB, no wheezes, rhonchi, or rales noted.  Respiratory effort nonlabored Abd: Soft, ND, NT, +BS, no masses, hernias, or organomegaly MS: are 4 extremities are symmetrical with no  cyanosis, clubbing, or edema.   Skin: warm and dry with no masses, lesions, or rashes.  She does have bandages present from skin tears and ecchymosis present on her arms and legs Psych: unable, sleeping from recent sedation Neuro: unable to evaluate, sleeping   Results for orders placed or performed during the hospital encounter of 10/01/20 (from the past 48 hour(s))  Respiratory Panel by RT PCR (Flu A&B, Covid) - Nasopharyngeal Swab     Status: None   Collection Time: 10/01/20  5:30 PM   Specimen: Nasopharyngeal Swab  Result Value Ref Range   SARS Coronavirus 2 by RT PCR NEGATIVE NEGATIVE    Comment: (NOTE) SARS-CoV-2 target nucleic acids are NOT DETECTED.  The SARS-CoV-2 RNA is generally detectable in upper respiratoy specimens during the acute phase of infection. The lowest concentration of SARS-CoV-2 viral copies this assay can detect is 131 copies/mL. A negative result does not preclude SARS-Cov-2 infection and should not be used as the sole basis for treatment or other patient management decisions. A negative result may occur with  improper specimen collection/handling, submission of specimen other than nasopharyngeal swab, presence of viral mutation(s) within the areas targeted by this assay, and inadequate number of viral copies (<131 copies/mL).  A negative result must be combined with clinical observations, patient history, and epidemiological information. The expected result is Negative.  Fact Sheet for Patients:  PinkCheek.be  Fact Sheet for Healthcare Providers:  GravelBags.it  This test is no t yet approved or cleared by the [place] FDA and  has been authorized for detection and/or diagnosis of SARS-CoV-2 by FDA under an Emergency Use Authorization (EUA). This EUA will remain  in effect (meaning this test can be used) for the duration of the COVID-19 declaration under Section 564(b)(1) of the Act, 21 U.S.C. section 360bbb-3(b)(1), unless the authorization is terminated or revoked sooner.     Influenza A by PCR NEGATIVE NEGATIVE   Influenza B by PCR NEGATIVE NEGATIVE    Comment: (NOTE) The Xpert Xpress SARS-CoV-2/FLU/RSV assay is intended as an aid in  the diagnosis of influenza from Nasopharyngeal swab specimens and  should not be used as a sole basis for treatment. Nasal washings and  aspirates are unacceptable for Xpert Xpress SARS-CoV-2/FLU/RSV  testing.  Fact Sheet for Patients: PinkCheek.be  Fact Sheet for Healthcare Providers: GravelBags.it  This test is not yet approved or cleared by the [place] FDA and  has been authorized for detection and/or diagnosis of SARS-CoV-2 by  FDA under an Emergency Use Authorization (EUA). This EUA will remain  in effect (meaning this test can be used) for the duration of the  Covid-19 declaration under Section 564(b)(1) of the Act, 21  U.S.C. section 360bbb-3(b)(1), unless the authorization is  terminated or revoked. Performed at [facility], [address], [place], [place] [address]   Urinalysis, Routine w reflex microscopic Urine, Catheterized     Status: Abnormal   Collection Time: 10/01/20  5:57 PM  Result Value Ref Range   Color, Urine AMBER  (A) YELLOW    Comment: BIOCHEMICALS MAY BE AFFECTED BY COLOR   APPearance CLEAR CLEAR   Specific Gravity, Urine >1.046 (H) 1.005 - 1.030   pH 5.0 5.0 - 8.0   Glucose, UA NEGATIVE NEGATIVE mg/dL   Hgb urine dipstick NEGATIVE NEGATIVE   Bilirubin Urine SMALL (A) NEGATIVE   Ketones, ur NEGATIVE NEGATIVE mg/dL   Protein, ur NEGATIVE NEGATIVE mg/dL   Nitrite NEGATIVE NEGATIVE   Leukocytes,Ua NEGATIVE NEGATIVE  Comment: Performed at [facility], [address], [place], [place] [address]  CBG monitoring, ED     Status: Abnormal   Collection Time: 10/01/20 10:23 PM  Result Value Ref Range   Glucose-Capillary 122 (H) 70 - 99 mg/dL    Comment: Glucose reference range applies only to samples taken after fasting for at least 8 hours.  Glucose, capillary     Status: Abnormal   Collection Time: 10/02/20  4:19 AM  Result Value Ref Range   Glucose-Capillary 139 (H) 70 - 99 mg/dL    Comment: Glucose reference range applies only to samples taken after fasting for at least 8 hours.  Glucose, capillary     Status: Abnormal   Collection Time: 10/02/20  7:32 AM  Result Value Ref Range   Glucose-Capillary 139 (H) 70 - 99 mg/dL    Comment: Glucose reference range applies only to samples taken after fasting for at least 8 hours.  Comprehensive metabolic panel     Status: Abnormal   Collection Time: 10/02/20  9:58 AM  Result Value Ref Range   Sodium 135 135 - 145 mmol/L   Potassium 4.5 3.5 - 5.1 mmol/L   Chloride 105 98 - 111 mmol/L   CO2 22 22 - 32 mmol/L   Glucose, Bld 159 (H) 70 - 99 mg/dL    Comment: Glucose reference range applies only to samples taken after fasting for at least 8 hours.   BUN 13 8 - 23 mg/dL   Creatinine, Ser 1.05 (H) 0.44 - 1.00 mg/dL   Calcium 9.0 8.9 - 10.3 mg/dL   Total Protein 5.6 (L) 6.5 - 8.1 g/dL   Albumin 2.2 (L) 3.5 - 5.0 g/dL   AST 62 (H) 15 - 41 U/L   ALT 56 (H) 0 - 44 U/L   Alkaline Phosphatase 721 (H) 38 - 126 U/L   Total Bilirubin 7.4 (H) 0.3 - 1.2 mg/dL    GFR, Estimated 50 (L) >60 mL/min    Comment: (NOTE) Calculated using the CKD-EPI Creatinine Equation (2021)    Anion gap 8 5 - 15    Comment: Performed at [facility], [place] [address], [place], [place] [address]  CBC with Differential/Platelet     Status: Abnormal   Collection Time: 10/02/20  9:58 AM  Result Value Ref Range   WBC 6.6 4.0 - 10.5 K/uL   RBC 2.49 (L) 3.87 - 5.11 MIL/uL   Hemoglobin 8.2 (L) 12.0 - 15.0 g/dL   HCT 25.4 (L) 36 - 46 %   MCV 102.0 (H) 80.0 - 100.0 fL   MCH 32.9 26.0 - 34.0 pg   MCHC 32.3 30.0 - 36.0 g/dL   RDW 18.0 (H) 11.5 - 15.5 %   Platelets 185 150 - 400 K/uL   nRBC 0.0 0.0 - 0.2 %   Neutrophils Relative % 84 %   Neutro Abs 5.6 1.7 - 7.7 K/uL   Lymphocytes Relative 8 %   Lymphs Abs 0.5 (L) 0.7 - 4.0 K/uL   Monocytes Relative 5 %   Monocytes Absolute 0.3 0.1 - 1.0 K/uL   Eosinophils Relative 1 %   Eosinophils Absolute 0.0 0.0 - 0.5 K/uL   Basophils Relative 1 %   Basophils Absolute 0.0 0.0 - 0.1 K/uL   Immature Granulocytes 1 %   Abs Immature Granulocytes 0.03 0.00 - 0.07 K/uL    Comment: Performed at [facility], [place] [address] [place], [place] [address]  Glucose, capillary  Status: Abnormal   Collection Time: 10/02/20 12:08 PM  Result Value Ref Range   Glucose-Capillary 143 (H) 70 - 99 mg/dL    Comment: Glucose reference range applies only to samples taken after fasting for at least 8 hours.  Glucose, capillary     Status: Abnormal   Collection Time: 10/02/20  3:50 PM  Result Value Ref Range   Glucose-Capillary 131 (H) 70 - 99 mg/dL    Comment: Glucose reference range applies only to samples taken after fasting for at least 8 hours.  Glucose, capillary     Status: Abnormal   Collection Time: 10/02/20  7:24 PM  Result Value Ref Range   Glucose-Capillary 193 (H) 70 - 99 mg/dL    Comment: Glucose reference range applies only to samples taken after fasting for at least 8 hours.  Glucose, capillary     Status: None    Collection Time: 10/02/20 11:28 PM  Result Value Ref Range   Glucose-Capillary 92 70 - 99 mg/dL    Comment: Glucose reference range applies only to samples taken after fasting for at least 8 hours.  Glucose, capillary     Status: Abnormal   Collection Time: 10/03/20  3:53 AM  Result Value Ref Range   Glucose-Capillary 146 (H) 70 - 99 mg/dL    Comment: Glucose reference range applies only to samples taken after fasting for at least 8 hours.  Comprehensive metabolic panel     Status: Abnormal   Collection Time: 10/03/20  6:04 AM  Result Value Ref Range   Sodium 139 135 - 145 mmol/L   Potassium 4.4 3.5 - 5.1 mmol/L   Chloride 110 98 - 111 mmol/L   CO2 22 22 - 32 mmol/L   Glucose, Bld 143 (H) 70 - 99 mg/dL    Comment: Glucose reference range applies only to samples taken after fasting for at least 8 hours.   BUN 11 8 - 23 mg/dL   Creatinine, Ser 1.07 (H) 0.44 - 1.00 mg/dL   Calcium 8.8 (L) 8.9 - 10.3 mg/dL   Total Protein 5.1 (L) 6.5 - 8.1 g/dL   Albumin 1.9 (L) 3.5 - 5.0 g/dL   AST 72 (H) 15 - 41 U/L   ALT 52 (H) 0 - 44 U/L   Alkaline Phosphatase 670 (H) 38 - 126 U/L   Total Bilirubin 7.5 (H) 0.3 - 1.2 mg/dL   GFR, Estimated 49 (L) >60 mL/min    Comment: (NOTE) Calculated using the CKD-EPI Creatinine Equation (2021)    Anion gap 7 5 - 15    Comment: Performed at [facility], [place] [address] [place], [place] [address]  CBC with Differential/Platelet     Status: Abnormal   Collection Time: 10/03/20  6:04 AM  Result Value Ref Range   WBC 5.8 4.0 - 10.5 K/uL   RBC 2.25 (L) 3.87 - 5.11 MIL/uL   Hemoglobin 7.4 (L) 12.0 - 15.0 g/dL   HCT 23.4 (L) 36 - 46 %   MCV 104.0 (H) 80.0 - 100.0 fL   MCH 32.9 26.0 - 34.0 pg   MCHC 31.6 30.0 - 36.0 g/dL   RDW 18.6 (H) 11.5 - 15.5 %   Platelets 148 (L) 150 - 400 K/uL   nRBC 0.0 0.0 - 0.2 %   Neutrophils Relative % 81 %   Neutro Abs 4.8 1.7 - 7.7 K/uL   Lymphocytes Relative 9 %   Lymphs Abs 0.5 (L) 0.7 - 4.0 K/uL   Monocytes Relative  7 %   Monocytes Absolute 0.4 0.1 - 1.0 K/uL   Eosinophils Relative 1 %   Eosinophils Absolute 0.1 0.0 - 0.5 K/uL   Basophils Relative 1 %   Basophils Absolute 0.0 0.0 - 0.1 K/uL   Immature Granulocytes 1 %   Abs Immature Granulocytes 0.03 0.00 - 0.07 K/uL    Comment: Performed at [facility] [address], [place], [place] [address]  Glucose, capillary     Status: Abnormal   Collection Time: 10/03/20  8:25 AM  Result Value Ref Range   Glucose-Capillary 142 (H) 70 - 99 mg/dL    Comment: Glucose reference range applies only to samples taken after fasting for at least 8 hours.  Glucose, capillary     Status: Abnormal   Collection Time: 10/03/20  2:37 PM  Result Value Ref Range   Glucose-Capillary 132 (H) 70 - 99 mg/dL    Comment: Glucose reference range applies only to samples taken after fasting for at least 8 hours.   CT ABDOMEN PELVIS W CONTRAST  Result Date: 10/01/2020 CLINICAL DATA:  Mid abdominal pain, dementia, jaundice EXAM: CT ABDOMEN AND PELVIS WITH CONTRAST TECHNIQUE: Multidetector CT imaging of the abdomen and pelvis was performed using the standard protocol following bolus administration of intravenous contrast. CONTRAST:  30mL OMNIPAQUE IOHEXOL 300 MG/ML  SOLN COMPARISON:  06/22/2020 FINDINGS: Lower chest: No acute pleural or parenchymal lung disease. Hepatobiliary: Multiple calcified gallstones are identified. No evidence of gallbladder wall thickening or acute cholecystitis. There is mild intrahepatic biliary duct dilation. No focal liver abnormalities. Common bile duct measures up to 9 mm in diameter, which is grossly normal for age. There is a 9 mm calculus within the downstream common bile duct best seen on image 24 series 3. Pancreas: Unremarkable. No pancreatic ductal dilatation or surrounding inflammatory changes. Spleen: Spleen is enlarged measuring 15.1 cm in craniocaudal length. No focal splenic abnormalities. Adrenals/Urinary Tract: There are bilateral renal  cortical cysts, largest off the lower pole left kidney measuring up to 6.8 cm. Bilateral renal cortical thinning. No nephrolithiasis or obstructive uropathy. The bladder is unremarkable. The adrenals are normal. Stomach/Bowel: No bowel obstruction or ileus. Distal colonic diverticulosis without diverticulitis. No bowel wall thickening or inflammatory change. Vascular/Lymphatic: Aortic atherosclerosis. No enlarged abdominal or pelvic lymph nodes. Reproductive: Uterus is surgically absent. There is a large cyst within the left adnexa measuring 9.8 x 6.6 x 6.2 cm. No mural nodularity or internal septation. No right adnexal mass. Other: No free fluid or free gas.  No abdominal wall hernia. Musculoskeletal: There is a subacute left anterolateral sixth rib fracture. Chronic L1 compression fracture is again noted. Reconstructed images demonstrate no additional findings. IMPRESSION: 1. Mild intrahepatic biliary duct dilation, with evidence of a 9 mm calculus in the downstream common bile duct. ERCP may be useful. 2. Cholelithiasis without cholecystitis. 3. 9.8 cm left adnexal cyst. Recommend follow-up US in 6-12 months. Note: This recommendation does not apply to premenarchal patients and to those with increased risk (genetic, family history, elevated tumor markers or other high-risk factors) of ovarian cancer. Reference: JACR 2020 Feb; 17(2):248-254 4. Splenomegaly. Electronically Signed   By: Randa Ngo M.D.   On: 10/01/2020 17:14   DG ERCP BILIARY & PANCREATIC DUCTS  Result Date: 10/03/2020 CLINICAL DATA:  86 year old female with jaundice EXAM: ERCP TECHNIQUE: Multiple spot images obtained with the fluoroscopic device and submitted for interpretation post-procedure. FLUOROSCOPY TIME:  Fluoroscopy Time:  2 minutes 38 seconds COMPARISON:  MR 10/02/2020 FINDINGS: Limited  intraoperative fluoroscopic spot images during ERCP. Initial image demonstrates the endoscope projecting over the upper abdomen. Cannulation of  the ampulla with safety wire in place and then retrograde infusion of contrast. Deployment of a retrieval balloon. IMPRESSION: Limited images during ERCP demonstrates partial opacification of the extrahepatic biliary ducts with deployment of a balloon retrieval catheter. Please refer to the dictated operative report for full details of intraoperative findings and procedure. Electronically Signed   By: Corrie Mckusick D.O.   On: 10/03/2020 14:08   MR ABDOMEN MRCP W WO CONTAST  Result Date: 10/02/2020 CLINICAL DATA:  Jaundice.  Biliary ductal dilatation on recent CT. EXAM: MRI ABDOMEN WITHOUT AND WITH CONTRAST (INCLUDING MRCP) TECHNIQUE: Multiplanar multisequence MR imaging of the abdomen was performed both before and after the administration of intravenous contrast. Heavily T2-weighted images of the biliary and pancreatic ducts were obtained, and three-dimensional MRCP images were rendered by post processing. CONTRAST:  87mL GADAVIST GADOBUTROL 1 MMOL/ML IV SOLN COMPARISON:  CT on 10/01/2020 FINDINGS: Lower chest: No acute findings. Hepatobiliary: No hepatic masses identified. Multiple gallstones are seen measuring up to 1 cm, and layering gallbladder sludge is also noted. No evidence of acute cholecystitis. Diffuse biliary ductal dilatation is seen, with common bile duct measuring 17 mm in diameter. An 11 mm calculus is seen in the distal common bile duct adjacent to the ampulla on image 19/3. Pancreas:  No mass or inflammatory changes. Spleen:  Within normal limits in size and appearance. Adrenals/Urinary Tract: Multiple small bilateral renal cortical and sinus cysts are seen. A 7 cm subcapsular cyst is seen projecting from the lower pole of the left kidney. No renal masses identified. No evidence of hydronephrosis. Stomach/Bowel: A few tiny duodenal diverticula are seen at the junction of the 2nd and 3rd portions. Otherwise unremarkable. Vascular/Lymphatic: No pathologically enlarged lymph nodes identified. No  abdominal aortic aneurysm. Other:  None. Musculoskeletal:  No suspicious bone lesions identified. IMPRESSION: Cholelithiasis and gallbladder sludge. No evidence of acute cholecystitis. Diffuse biliary ductal dilatation, due to 11 mm calculus in the distal common bile duct adjacent to the ampulla. Electronically Signed   By: Marlaine Hind M.D.   On: 10/02/2020 16:10   ECHOCARDIOGRAM COMPLETE  Result Date: 10/03/2020    ECHOCARDIOGRAM REPORT   Patient Name:   SREYA FROIO Date of Exam: 10/03/2020 Medical Rec #:  [id number]        Height:       64.0 in Accession #:    [id number]       Weight:       147.0 lb Date of Birth:  1928/04/16        BSA:          1.716 m Patient Age:    67 years         BP:           136/59 mmHg Patient Gender: F                HR:           65 bpm. Exam Location:  Inpatient Procedure: 2D Echo Indications:    cardiomyopathy 425.9  History:        Patient has prior history of Echocardiogram examinations, most                 recent 02/11/2016. CAD, Abnormal ECG; Risk Factors:Diabetes,                 Dyslipidemia and Hypertension.  Sonographer:  Johny Chess Referring Phys: Buford Dresser  Sonographer Comments: Image acquisition challenging due to patient behavioral factors. and Image acquisition challenging due to uncooperative patient. IMPRESSIONS  1. Left ventricular ejection fraction, by estimation, is 55 to 60%. The left ventricle has normal function. The left ventricle has no regional wall motion abnormalities. There is mild left ventricular hypertrophy. Left ventricular diastolic parameters are consistent with Grade I diastolic dysfunction (impaired relaxation).  2. Right ventricular systolic function is normal. The right ventricular size is normal. There is normal pulmonary artery systolic pressure. The estimated right ventricular systolic pressure is 23.5 mmHg.  3. Left atrial size was mildly dilated.  4. The mitral valve is normal in structure. Trivial mitral valve  regurgitation. No evidence of mitral stenosis.  5. The aortic valve is tricuspid. Aortic valve regurgitation is mild.  6. The inferior vena cava is normal in size with greater than 50% respiratory variability, suggesting right atrial pressure of 3 mmHg. FINDINGS  Left Ventricle: Left ventricular ejection fraction, by estimation, is 55 to 60%. The left ventricle has normal function. The left ventricle has no regional wall motion abnormalities. The left ventricular internal cavity size was normal in size. There is  mild left ventricular hypertrophy. Left ventricular diastolic parameters are consistent with Grade I diastolic dysfunction (impaired relaxation). Right Ventricle: The right ventricular size is normal. No increase in right ventricular wall thickness. Right ventricular systolic function is normal. There is normal pulmonary artery systolic pressure. The tricuspid regurgitant velocity is 2.82 m/s, and  with an assumed right atrial pressure of 3 mmHg, the estimated right ventricular systolic pressure is 57.3 mmHg. Left Atrium: Left atrial size was mildly dilated. Right Atrium: Right atrial size was normal in size. Pericardium: Trivial pericardial effusion is present. Mitral Valve: The mitral valve is normal in structure. Mild mitral annular calcification. Trivial mitral valve regurgitation. No evidence of mitral valve stenosis. Tricuspid Valve: The tricuspid valve is normal in structure. Tricuspid valve regurgitation is trivial. Aortic Valve: The aortic valve is tricuspid. Aortic valve regurgitation is mild. Aortic regurgitation PHT measures 433 msec. Pulmonic Valve: The pulmonic valve was normal in structure. Pulmonic valve regurgitation is not visualized. Aorta: The aortic root is normal in size and structure. Venous: The inferior vena cava is normal in size with greater than 50% respiratory variability, suggesting right atrial pressure of 3 mmHg. IAS/Shunts: No atrial level shunt detected by color flow  Doppler.  LEFT VENTRICLE PLAX 2D LVIDd:         4.30 cm Diastology LVIDs:         2.70 cm LV e' medial:    4.68 cm/s LV PW:         1.00 cm LV E/e' medial:  18.5 LV IVS:        1.10 cm LV e' lateral:   6.09 cm/s                        LV E/e' lateral: 14.2  RIGHT VENTRICLE             IVC RV S prime:     13.10 cm/s  IVC diam: 1.40 cm LEFT ATRIUM             Index       RIGHT ATRIUM           Index LA diam:        3.00 cm 1.75 cm/m  RA Area:     11.00 cm LA Vol (  A2C):   54.6 ml 31.81 ml/m RA Volume:   23.00 ml  13.40 ml/m LA Vol (A4C):   46.1 ml 26.86 ml/m LA Biplane Vol: 52.3 ml 30.47 ml/m  AORTIC VALVE LVOT Vmax:   124.00 cm/s LVOT Vmean:  83.300 cm/s LVOT VTI:    0.319 m AI PHT:      433 msec  AORTA Ao Root diam: 3.40 cm Ao Asc diam:  3.50 cm MITRAL VALVE                TRICUSPID VALVE MV Area (PHT): 2.24 cm     TR Peak grad:   31.8 mmHg MV Decel Time: 338 msec     TR Vmax:        282.00 cm/s MV E velocity: 86.60 cm/s MV A velocity: 128.00 cm/s  SHUNTS MV E/A ratio:  0.68         Systemic VTI: 0.32 m Loralie Champagne MD Electronically signed by Loralie Champagne MD Signature Date/Time: 10/03/2020/12:26:17 PM    Final    Anti-infectives (From admission, onward)   Start     Dose/Rate Route Frequency Ordered Stop   10/02/20 0000  piperacillin-tazobactam (ZOSYN) IVPB 3.375 g        3.375 g 12.5 mL/hr over 240 Minutes Intravenous Every 8 hours 10/01/20 1926     10/01/20 1800  piperacillin-tazobactam (ZOSYN) IVPB 3.375 g        3.375 g 100 mL/hr over 30 Minutes Intravenous  Once 10/01/20 1750 10/01/20 1945       Assessment/Plan CAD s/p NSTEMI 07/2017 Hx CHF (55-60% on 10/03/20) HTN HLD DM2 Dementia - currently resides at [facility]  Choledocholithiasis This is a 86 year old female who presented from her AL with jaundice and was found to have choledocholithiasis.  There is no evidence of cholecystitis on CT with contrast or MRCP.  Patient underwent ERCP today with successful  removal of stones and biliary sphincterotomy performed.  We were asked to see for possible laparoscopic cholecystectomy.  A biliary sphincterotomy was performed during ERCP which will hopefully prevent patient from having recurrent choledocholithiasis in the near future.  After discussing with patient's daughter the risks and benefits of surgery, they would like to hold off on surgery at this time.  She states she and her other siblings have discussed this situation prior to this visit and they all agree to not proceed with surgery especially given her worsening dementia and her advanced age. We discussed that if they changed their minds that they could follow up with [place] in our office to discuss surgery.  She would need cardiac clearance for GETA/surgery at that time.  Otherwise for now, given she has a pretty high chance of no further problems, we will hold on surgery.  Her diet can be advanced as tolerated.  As long as she has no other symptoms with her diet, no surgery will be planned.    FEN - CLD, okay to advance from our standpoint, IVF per [facility] VTE - SCDs, okay for chemical prophylaxis from a general surgery standpoint ID - Zosyn, but doesn't need further from gallbladder standpoint  We will check on her tomorrow, otherwise plans GI and primary service.  Henreitta Cea, [facility] 10/03/2020, 3:02 PM Please see Amion for pager number during day hours 7:00am-4:30pm

## 2020-10-03 NOTE — Progress Notes (Addendum)
Bed alarmed noted, saw patient awoke up and OOB, pulling her IV catheter. Reoriented patiend and calmed her down but to no avail, attempting to bite our hands and kicking. Provided apron to play with but pt throw it on the floor.  On-call [place]. Was made aware and ordered for Haldol 2 mg IM once.  Administered Haldol per order by Dr. Marlowe Sax, but still fighting and attempting to pull her IV catheter. An NT on the floor was pulled out to closely watch patient until the medication will have an effect.  Will monitor closely.

## 2020-10-03 NOTE — Anesthesia Procedure Notes (Signed)
Procedure Name: Intubation Date/Time: 10/03/2020 12:49 PM Performed by: Genelle Bal, CRNA Pre-anesthesia Checklist: Patient identified, Emergency Drugs available, Suction available and Patient being monitored Patient Re-evaluated:Patient Re-evaluated prior to induction Oxygen Delivery Method: Circle system utilized Preoxygenation: Pre-oxygenation with 100% oxygen Induction Type: IV induction Ventilation: Mask ventilation without difficulty Laryngoscope Size: Miller and 2 Grade View: Grade I Tube type: Oral Tube size: 7.0 mm Number of attempts: 1 Airway Equipment and Method: Stylet and Oral airway Placement Confirmation: ETT inserted through vocal cords under direct vision,  positive ETCO2 and breath sounds checked- equal and bilateral Secured at: 21 cm Tube secured with: Tape Dental Injury: Teeth and Oropharynx as per pre-operative assessment

## 2020-10-03 NOTE — Progress Notes (Signed)
PROGRESS NOTE  Robin Guzman WEX:[id number] DOB: November 24, 1927 DOA: 10/01/2020 PCP: Celene Squibb, MD  Brief History   Robin Guzman  is a 86 y.o. female,with medical history significant forhypertension, hyperlipidemia, type 2 diabetes mellitus, CAD status post NSTEMI 07/2017 cath with mild nonobstructive disease, LVEF 25 to 30% with wall motion abnormalities suggesting stress-induced cardiomyopathy and elevated LVEDP,LBBB, hard of hearing and dementia. -Patient was sent by ED for evaluation of jaundice, patient herself cannot provide any complaints, family went to visit patient in the facility, they noted her to be jaundiced, and her oxygen level low, labs were significant for elevated bilirubin, so she was sent to ED for further evaluation, patient with advanced dementia, herself cannot provide any complaints. - in ED for total bilirubin of 8.3, AST of 98, ALT of 69, with lipase of 27, creatinine was at baseline at 1.01 had no leukocytosis, her hemoglobin lower than baseline at 8.4, CT abdomen pelvis was significant for mild intrahepatic biliary duct dilation with evidence of 9 mm calculus in the downstream common bile duct.   [facility] consulted to admit for further evaluation and treatment. Gastroenterology has been consulted for elevated LFT's and possible ERCP. Cardiology has been consulted and has cleared the patient for possible ERCP.  MRCP was performed on 10/02/2020 and confirmed the presence of an 43mm stone in the distal CBD. The patient underwent ERCP this morning with sphincterotomy and removal of stones. General surgery was consulted for consideration of laparoscopic cholecystectomy. Dr. Maxwell Caul evaluated the patient and discussed the option with the family. They did not feel that the benefit of surgery at this time was worth the risk to the patient.  Consultants  . Gastroenterology . Cardiology  Procedures  . ERCP with sphincterotomy  Antibiotics   Anti-infectives (From  admission, onward)   Start     Dose/Rate Route Frequency Ordered Stop   10/02/20 0000  piperacillin-tazobactam (ZOSYN) IVPB 3.375 g        3.375 g 12.5 mL/hr over 240 Minutes Intravenous Every 8 hours 10/01/20 1926     10/01/20 1800  piperacillin-tazobactam (ZOSYN) IVPB 3.375 g        3.375 g 100 mL/hr over 30 Minutes Intravenous  Once 10/01/20 1750 10/01/20 1945      Subjective  The patient was seen prior to the procedure. She is resting quietly and is not awakened.  Objective   Vitals:  Vitals:   10/03/20 1419 10/03/20 1434  BP: (!) 157/64 (!) 145/72  Pulse: 66 62  Resp: 14 17  Temp:  97.8 F (36.6 C)  SpO2: 94% 98%    Exam:  Constitutional:  The patient is resting quietly and is not awakened. She is very HOH. No acute distress. Respiratory:  . No increased work of breathing. . No wheezes, rales, or rhonchi . No tactile fremitus Cardiovascular:  . Regular rate and rhythm . No murmurs, ectopy, or gallups. . No lateral PMI. No thrills. Abdomen:  . Abdomen is soft, non-tender, non-distended . No hernias, masses, or organomegaly . Normoactive bowel sounds.  Musculoskeletal:  . No cyanosis, clubbing, or edema Skin:  . Icteric without lesions or ulcers . palpation of skin: no induration or nodules Neurologic:  Unable to evaluate due to the patient's inability to cooperate with exam. Psychiatric:  . Unable to evaluate due to the patient's inability to cooperate with exam.  I have personally reviewed the following:   Today's Data  . Vitals, CMP, CBC  Imaging  . CT abdomen and  pelvis: 23mm Calculus in CBD . MRCP: 63mm  Stone in distal CBD  Cardiology Data  . EKG: Unchanged from previous  Scheduled Meds: . carvedilol  25 mg Oral BID WC  . insulin aspart  0-6 Units Subcutaneous Q4H   Continuous Infusions: . sodium chloride 75 mL/hr at 10/03/20 1449  . piperacillin-tazobactam (ZOSYN)  IV Stopped (10/03/20 1446)    Active Problems:   Chronic diastolic  heart failure ([place])   Essential hypertension   Controlled type 2 diabetes mellitus without complication, without long-term current use of insulin ([facility])   Dyslipidemia   Atherosclerosis of aorta ([facility])   Back pain   Choledocholithiasis   LOS: 2 days   Choledocholithiasis with possible cholangitis/obstructive jaundice: 55mm stone seen in distal CBD on CT. 63mm on MRCP 10/02/2020. She presents with jaundice, but she is afebrile, with no leukocytosis, she will be kept empirically on IV Zosyn, she will be kept n.p.o., and on IV fluids. MRCP was performed on 10/02/2020 and confirmed the presence of an 32mm stone in the distal CBD. The patient underwent ERCP this morning with sphincterotomy and removal of stones. General surgery was consulted for consideration of laparoscopic cholecystectomy. Dr. Maxwell Caul evaluated the patient and discussed the option with the family. They did not feel that the benefit of surgery at this time was worth the risk to the patient. Will resume chemical prophylaxis as per surgery's recommendations.  CAD/ischemic cardiomyopathy: Noted. The patient has a history of CAD status post NSTEMI 07/2017 cath with mild nonobstructive disease, LVEF 25 to 30% with wall motion abnormalities suggesting stress-induced cardiomyopathy and elevated LVEDP. EKG is unchanged from 06/2020. Continue with Coreg, hold aspirin anticipation for procedure. Cardiology has cleared the patient for ERCP.  Hypertension: Continue with Coreg.  Dementia: Continue with supportive care.  9.8 cm left adnexal cyst to dental finding on imaging. Radiology recommend follow-up US in 6-12 months.  I have seen and examined this patient myself. I have spent 32 minutes in her evaluation and care.  DVT Prophylaxis Heparin Code Status:  DNR Family Communication: Daughter at bedside. All questions answered to the best of my ability. Disposition: Status is: Inpatient  Remains inpatient appropriate because:Inpatient  level of care appropriate due to severity of illness  Dispo: The patient is from: SNF              Anticipated d/c is to: SNF              Anticipated d/c date is: 1 day              Patient currently is not medically stable to d/c.  Arsen Mangione, DO [facility] Direct contact: see [email or web address]  7PM-7AM contact night coverage as above 10/03/2020, 4:31 PM  LOS: 1 day

## 2020-10-03 NOTE — Care Management Important Message (Signed)
Important Message  Patient Details  Name: Robin Guzman MRN: [id number] Date of Birth: Jan 10, 1928   Medicare Important Message Given:  Yes     Jowan Skillin Montine Circle 10/03/2020, 3:53 PM

## 2020-10-03 NOTE — Transfer of Care (Signed)
Immediate Anesthesia Transfer of Care Note  Patient: Robin Guzman  Procedure(s) Performed: ENDOSCOPIC RETROGRADE CHOLANGIOPANCREATOGRAPHY (ERCP) (N/A ) SPHINCTEROTOMY REMOVAL OF STONES  Patient Location: Endoscopy Unit  Anesthesia Type:General  Level of Consciousness: awake  Airway & Oxygen Therapy: Patient Spontanous Breathing and Patient connected to face mask oxygen  Post-op Assessment: Report given to RN and Post -op Vital signs reviewed and stable  Post vital signs: Reviewed and stable  Last Vitals:  Vitals Value Taken Time  BP 156/65 10/03/20 1354  Temp 36.7 C 10/03/20 1352  Pulse 69 10/03/20 1357  Resp 20 10/03/20 1357  SpO2 95 % 10/03/20 1357  Vitals shown include unvalidated device data.  Last Pain:  Vitals:   10/03/20 1352  TempSrc: Oral  PainSc:          Complications: No complications documented.

## 2020-10-03 NOTE — Op Note (Signed)
[facility] Patient Name: Robin Guzman Procedure Date : 10/03/2020 MRN: [id number] Attending MD: Arta Silence , MD Date of Birth: [birth year] CSN: [id number] Age: 86 Admit Type: Inpatient Procedure:                ERCP Indications:              Common bile duct stone(s), Jaundice, Elevated liver                            enzymes Providers:                Arta Silence, MD, Baird Cancer, RN, Cletis [place],                            Technician Referring MD:             [facility] Medicines:                General Anesthesia, Zosyn 3.375 g, Indomethacin 100                            mg PR Complications:            Brusing on left face and on left leg (without                            trauma, just from head positioning during procedure                            and moving patient off stretcher, respectively; per                            nursing patient's skin extremely fragile). Estimated Blood Loss:     Estimated blood loss: none. Procedure:                Pre-Anesthesia Assessment:                           - Prior to the procedure, a History and Physical                            was performed, and patient medications and                            allergies were reviewed. The patient's tolerance of                            previous anesthesia was also reviewed. The risks                            and benefits of the procedure and the sedation                            options and risks were discussed with the patient.  All questions were answered, and informed consent                            was obtained. Prior Anticoagulants: The patient has                            taken no previous anticoagulant or antiplatelet                            agents. ASA Grade Assessment: III - A patient with                            severe systemic disease. After reviewing the risks                            and benefits, the  patient was deemed in                            satisfactory condition to undergo the procedure.                           After obtaining informed consent, the scope was                            passed under direct vision. Throughout the                            procedure, the patient's blood pressure, pulse, and                            oxygen saturations were monitored continuously. The                            TJF-Q180V ([id number]) Olympus duodenoscope was                            introduced through the mouth, and used to inject                            contrast into and used to inject contrast into the                            bile duct. The ERCP was accomplished without                            difficulty. The patient tolerated the procedure                            well. Scope In: Scope Out: Findings:      The scout film was normal. The scope was passed under direct vision       through the upper GI tract. A large-mouthed multilobular diverticulum       was found in the first portion of the duodenum. The scope was passed  under direct vision through the upper GI tract. Many superficial       duodenal ulcers were found in the duodenal bulb, in the first portion of       the duodenum. The major papilla was bulbous but otherwise was       normal-appearing. The bile duct was deeply cannulated. Contrast was       injected. I personally interpreted the bile duct images. Ductal flow of       contrast was adequate. The main bile duct was moderately dilated. The       largest diameter was 13 mm. The lower third of the main bile duct       contained one stone, which was 10 mm in diameter. A 10 mm biliary       sphincterotomy was made with a traction (standard) sphincterotome using       blended current. There was no post-sphincterotomy bleeding. The biliary       tree was swept with a basket starting at the upper third of the main       bile duct, middle third of the  main bile duct and lower third of the       main duct. One stone was removed. A few stones remained. The biliary       tree was swept with a 15 mm balloon starting at the upper third of the       main bile duct, middle third of the main bile duct and lower third of       the main duct. A few stones were removed. No stones remained. Impression:               - Large multilobular duodenal diverticulum in first                            portion of duodenum.                           - Superficial proximal duodenal ulcers in bulb/D1.                           - The major papilla was bulbous but otherwise                            appeared normal.                           - The entire main bile duct was moderately dilated.                           - A biliary sphincterotomy was performed.                           - Choledocholithiasis was found. Large stone                            removed with basket; smaller stones/sludge removed                            with balloon. Moderate Sedation:      None Recommendation:           -  Watch for pancreatitis, bleeding, perforation,                            and cholangitis.                           - Avoid aspirin and nonsteroidal anti-inflammatory                            medicines for 5 days.                           - Continue present medications.                           - Clear liquid diet today.                           - Surgical consultation for consideration of                            cholecystectomy. Procedure Code(s):        --- Professional ---                           [phone], Endoscopic retrograde                            cholangiopancreatography (ERCP); with removal of                            calculi/debris from biliary/pancreatic duct(s)                           43262, Endoscopic retrograde                            cholangiopancreatography (ERCP); with                             sphincterotomy/papillotomy Diagnosis Code(s):        --- Professional ---                           K26.9, Duodenal ulcer, unspecified as acute or                            chronic, without hemorrhage or perforation                           K80.50, Calculus of bile duct without cholangitis                            or cholecystitis without obstruction                           R17, Unspecified jaundice  R74.8, Abnormal levels of other serum enzymes                           K57.10, Diverticulosis of small intestine without                            perforation or abscess without bleeding                           K83.8, Other specified diseases of biliary tract CPT copyright 2019 [employer]. All rights reserved. The codes documented in this report are preliminary and upon coder review may  be revised to meet current compliance requirements. Arta Silence, MD 10/03/2020 1:46:14 PM This report has been signed electronically. Number of Addenda: 0

## 2020-10-04 DIAGNOSIS — K8050 Calculus of bile duct without cholangitis or cholecystitis without obstruction: Secondary | ICD-10-CM | POA: Diagnosis not present

## 2020-10-04 DIAGNOSIS — M545 Low back pain, unspecified: Secondary | ICD-10-CM | POA: Diagnosis not present

## 2020-10-04 DIAGNOSIS — I700 Atherosclerosis of aorta: Secondary | ICD-10-CM | POA: Diagnosis not present

## 2020-10-04 DIAGNOSIS — R17 Unspecified jaundice: Secondary | ICD-10-CM | POA: Diagnosis not present

## 2020-10-04 LAB — COMPREHENSIVE METABOLIC PANEL
ALT: 48 U/L — ABNORMAL HIGH (ref 0–44)
AST: 54 U/L — ABNORMAL HIGH (ref 15–41)
Albumin: 1.9 g/dL — ABNORMAL LOW (ref 3.5–5.0)
Alkaline Phosphatase: 600 U/L — ABNORMAL HIGH (ref 38–126)
Anion gap: 9 (ref 5–15)
BUN: 15 mg/dL (ref 8–23)
CO2: 20 mmol/L — ABNORMAL LOW (ref 22–32)
Calcium: 8.7 mg/dL — ABNORMAL LOW (ref 8.9–10.3)
Chloride: 110 mmol/L (ref 98–111)
Creatinine, Ser: 1.26 mg/dL — ABNORMAL HIGH (ref 0.44–1.00)
GFR, Estimated: 41 mL/min — ABNORMAL LOW (ref >60)
Glucose, Bld: 154 mg/dL — ABNORMAL HIGH (ref 70–99)
Potassium: 4.7 mmol/L (ref 3.5–5.1)
Sodium: 139 mmol/L (ref 135–145)
Total Bilirubin: 6 mg/dL — ABNORMAL HIGH (ref 0.3–1.2)
Total Protein: 5 g/dL — ABNORMAL LOW (ref 6.5–8.1)

## 2020-10-04 LAB — CBC WITH DIFFERENTIAL/PLATELET
Abs Immature Granulocytes: 0.05 10*3/uL (ref 0.00–0.07)
Basophils Absolute: 0 10*3/uL (ref 0.0–0.1)
Basophils Relative: 0 %
Eosinophils Absolute: 0 10*3/uL (ref 0.0–0.5)
Eosinophils Relative: 0 %
HCT: 24.1 % — ABNORMAL LOW (ref 36.0–46.0)
Hemoglobin: 7.7 g/dL — ABNORMAL LOW (ref 12.0–15.0)
Immature Granulocytes: 1 %
Lymphocytes Relative: 7 %
Lymphs Abs: 0.4 10*3/uL — ABNORMAL LOW (ref 0.7–4.0)
MCH: 33.2 pg (ref 26.0–34.0)
MCHC: 32 g/dL (ref 30.0–36.0)
MCV: 103.9 fL — ABNORMAL HIGH (ref 80.0–100.0)
Monocytes Absolute: 0.1 10*3/uL (ref 0.1–1.0)
Monocytes Relative: 2 %
Neutro Abs: 4.7 10*3/uL (ref 1.7–7.7)
Neutrophils Relative %: 90 %
Platelets: 169 10*3/uL (ref 150–400)
RBC: 2.32 MIL/uL — ABNORMAL LOW (ref 3.87–5.11)
RDW: 18.5 % — ABNORMAL HIGH (ref 11.5–15.5)
WBC: 5.3 10*3/uL (ref 4.0–10.5)
nRBC: 0.4 % — ABNORMAL HIGH (ref 0.0–0.2)

## 2020-10-04 LAB — GLUCOSE, CAPILLARY
Glucose-Capillary: 123 mg/dL — ABNORMAL HIGH (ref 70–99)
Glucose-Capillary: 125 mg/dL — ABNORMAL HIGH (ref 70–99)
Glucose-Capillary: 125 mg/dL — ABNORMAL HIGH (ref 70–99)
Glucose-Capillary: 200 mg/dL — ABNORMAL HIGH (ref 70–99)
Glucose-Capillary: 200 mg/dL — ABNORMAL HIGH (ref 70–99)
Glucose-Capillary: 224 mg/dL — ABNORMAL HIGH (ref 70–99)

## 2020-10-04 MED ORDER — PANTOPRAZOLE SODIUM 40 MG IV SOLR
40.0000 mg | Freq: Two times a day (BID) | INTRAVENOUS | Status: DC
  Administered 2020-10-04 – 2020-10-05 (×3): 40 mg via INTRAVENOUS
  Filled 2020-10-04 (×3): qty 40

## 2020-10-04 NOTE — Progress Notes (Signed)
PROGRESS NOTE  Robin Guzman JAS:[id number] DOB: Jun 04, 1928 DOA: 10/01/2020 PCP: Celene Squibb, MD  Brief History   Robin Guzman  is a 86 y.o. female,with medical history significant forhypertension, hyperlipidemia, type 2 diabetes mellitus, CAD status post NSTEMI 07/2017 cath with mild nonobstructive disease, LVEF 25 to 30% with wall motion abnormalities suggesting stress-induced cardiomyopathy and elevated LVEDP,LBBB, hard of hearing and dementia. -Patient was sent by ED for evaluation of jaundice, patient herself cannot provide any complaints, family went to visit patient in the facility, they noted her to be jaundiced, and her oxygen level low, labs were significant for elevated bilirubin, so she was sent to ED for further evaluation, patient with advanced dementia, herself cannot provide any complaints. - in ED for total bilirubin of 8.3, AST of 98, ALT of 69, with lipase of 27, creatinine was at baseline at 1.01 had no leukocytosis, her hemoglobin lower than baseline at 8.4, CT abdomen pelvis was significant for mild intrahepatic biliary duct dilation with evidence of 9 mm calculus in the downstream common bile duct.   [facility] consulted to admit for further evaluation and treatment. Gastroenterology has been consulted for elevated LFT's and possible ERCP. Cardiology has been consulted and has cleared the patient for possible ERCP.  MRCP was performed on 10/02/2020 and confirmed the presence of an 83mm stone in the distal CBD. The patient underwent ERCP this morning with sphincterotomy and removal of stones. General surgery was consulted for consideration of laparoscopic cholecystectomy. Dr. Maxwell Caul evaluated the patient and discussed the option with the family. They did not feel that the benefit of surgery at this time was worth the risk to the patient.  ERCP demonstrated duodenal ulcer. The patient underwent sphincterotomy with removal of stone. She was returned to her room on  clear liquid diet. She has been started on protonix.  Consultants  . Gastroenterology . Cardiology  Procedures  . ERCP with sphincterotomy  Antibiotics   Anti-infectives (From admission, onward)   Start     Dose/Rate Route Frequency Ordered Stop   10/02/20 0000  piperacillin-tazobactam (ZOSYN) IVPB 3.375 g        3.375 g 12.5 mL/hr over 240 Minutes Intravenous Every 8 hours 10/01/20 1926     10/01/20 1800  piperacillin-tazobactam (ZOSYN) IVPB 3.375 g        3.375 g 100 mL/hr over 30 Minutes Intravenous  Once 10/01/20 1750 10/01/20 1945      Subjective  The patient is sitting up at bedside. She has no new complaints.  Objective   Vitals:  Vitals:   10/04/20 0342 10/04/20 1306  BP: 110/67 (!) 97/51  Pulse: 67 71  Resp:  18  Temp: (!) 97.5 F (36.4 C) 98.1 F (36.7 C)  SpO2: 97% 99%    Exam:  Constitutional:  The patient is resting quietly.  No acute distress. Respiratory:  . No increased work of breathing. . No wheezes, rales, or rhonchi . No tactile fremitus Cardiovascular:  . Regular rate and rhythm . No murmurs, ectopy, or gallups. . No lateral PMI. No thrills. Abdomen:  . Abdomen is soft, non-tender, non-distended . No hernias, masses, or organomegaly . Normoactive bowel sounds.  Musculoskeletal:  . No cyanosis, clubbing, or edema Skin:  . Icteric without lesions or ulcers . palpation of skin: no induration or nodules Neurologic:  Unable to evaluate due to the patient's inability to cooperate with exam. Psychiatric:  . Unable to evaluate due to the patient's inability to cooperate with exam.  I  have personally reviewed the following:   Today's Data  . Vitals, CMP, CBC  Imaging  . CT abdomen and pelvis: 49mm Calculus in CBD . MRCP: 55mm  Stone in distal CBD  Cardiology Data  . EKG: Unchanged from previous  Scheduled Meds: . carvedilol  25 mg Oral BID WC  . insulin aspart  0-6 Units Subcutaneous Q4H  . pantoprazole (PROTONIX) IV  40 mg  Intravenous Q12H   Continuous Infusions: . sodium chloride 75 mL/hr at 10/04/20 1400  . piperacillin-tazobactam (ZOSYN)  IV 3.375 g (10/04/20 0909)    Active Problems:   Chronic diastolic heart failure ([facility])   Essential hypertension   Controlled type 2 diabetes mellitus without complication, without long-term current use of insulin ([facility])   Dyslipidemia   Atherosclerosis of aorta ([facility])   Back pain   Choledocholithiasis   LOS: 3 days   Choledocholithiasis with possible cholangitis/obstructive jaundice: 62mm stone seen in distal CBD on CT. 52mm on MRCP 10/02/2020. She presents with jaundice, but she is afebrile, with no leukocytosis, she will be kept empirically on IV Zosyn, she will be kept n.p.o., and on IV fluids. MRCP was performed on 10/02/2020 and confirmed the presence of an 55mm stone in the distal CBD. The patient underwent ERCP this morning with sphincterotomy and removal of stones. She was also found to have a duodenal ulcer. General surgery was consulted for consideration of laparoscopic cholecystectomy. Dr. Maxwell Caul evaluated the patient and discussed the option with the family. They did not feel that the benefit of surgery at this time was worth the risk to the patient. Will resume chemical prophylaxis as per surgery's recommendations. She was started on protonix.   CAD/ischemic cardiomyopathy: Noted. The patient has a history of CAD status post NSTEMI 07/2017 cath with mild nonobstructive disease, LVEF 25 to 30% with wall motion abnormalities suggesting stress-induced cardiomyopathy and elevated LVEDP. EKG is unchanged from 06/2020. Continue with Coreg, hold aspirin anticipation for procedure. Cardiology cleared the patient for ERCP.  Hypertension: Continue with Coreg.  Dementia: Continue with supportive care.  9.8 cm left adnexal cyst to dental finding on imaging. Radiology recommend follow-up US in 6-12 months.  I have seen and examined this patient myself. I have spent 30  minutes in her evaluation and care.  DVT Prophylaxis Heparin Code Status:  DNR Family Communication: Daughter at bedside. All questions answered to the best of my ability. Disposition: Status is: Inpatient  Remains inpatient appropriate because:Inpatient level of care appropriate due to severity of illness  Dispo: The patient is from: SNF              Anticipated d/c is to: SNF              Anticipated d/c date is: 1 day              Patient currently is not medically stable to d/c.  Vasilia Dise, DO [facility] Direct contact: see [email or web address]  7PM-7AM contact night coverage as above 10/04/2020, 4:34 PM  LOS: 1 day

## 2020-10-04 NOTE — Progress Notes (Signed)
Patient ID: Robin Guzman, female   DOB: Apr 09, 1928, 86 y.o.   MRN: [id number] Family has decided not to pursue surgery. [facility] signing off.   Kaylyn Lim

## 2020-10-04 NOTE — Progress Notes (Signed)
Subjective: Patient was sitting on a bedside chair, she is very hard of hearing. However, denies any nausea or abdominal pain.  Objective: Vital signs in last 24 hours: Temp:  [97.5 F (36.4 C)-98.1 F (36.7 C)] 98.1 F (36.7 C) (11/13 1306) Pulse Rate:  [58-71] 71 (11/13 1306) Resp:  [15-18] 18 (11/13 1306) BP: (97-159)/(51-72) 97/51 (11/13 1306) SpO2:  [97 %-100 %] 99 % (11/13 1306) Weight change:  Last BM Date: 10/03/20  PE: Elderly, hard of hearing GENERAL: Icteric, mild pallor ABDOMEN: Soft, nondistended, nontender EXTREMITIES: No deformity  Lab Results: Results for orders placed or performed during the hospital encounter of 10/01/20 (from the past 48 hour(s))  Glucose, capillary     Status: Abnormal   Collection Time: 10/02/20  3:50 PM  Result Value Ref Range   Glucose-Capillary 131 (H) 70 - 99 mg/dL    Comment: Glucose reference range applies only to samples taken after fasting for at least 8 hours.  Glucose, capillary     Status: Abnormal   Collection Time: 10/02/20  7:24 PM  Result Value Ref Range   Glucose-Capillary 193 (H) 70 - 99 mg/dL    Comment: Glucose reference range applies only to samples taken after fasting for at least 8 hours.  Glucose, capillary     Status: None   Collection Time: 10/02/20 11:28 PM  Result Value Ref Range   Glucose-Capillary 92 70 - 99 mg/dL    Comment: Glucose reference range applies only to samples taken after fasting for at least 8 hours.  Glucose, capillary     Status: Abnormal   Collection Time: 10/03/20  3:53 AM  Result Value Ref Range   Glucose-Capillary 146 (H) 70 - 99 mg/dL    Comment: Glucose reference range applies only to samples taken after fasting for at least 8 hours.  Comprehensive metabolic panel     Status: Abnormal   Collection Time: 10/03/20  6:04 AM  Result Value Ref Range   Sodium 139 135 - 145 mmol/L   Potassium 4.4 3.5 - 5.1 mmol/L   Chloride 110 98 - 111 mmol/L   CO2 22 22 - 32 mmol/L   Glucose, Bld 143  (H) 70 - 99 mg/dL    Comment: Glucose reference range applies only to samples taken after fasting for at least 8 hours.   BUN 11 8 - 23 mg/dL   Creatinine, Ser 1.07 (H) 0.44 - 1.00 mg/dL   Calcium 8.8 (L) 8.9 - 10.3 mg/dL   Total Protein 5.1 (L) 6.5 - 8.1 g/dL   Albumin 1.9 (L) 3.5 - 5.0 g/dL   AST 72 (H) 15 - 41 U/L   ALT 52 (H) 0 - 44 U/L   Alkaline Phosphatase 670 (H) 38 - 126 U/L   Total Bilirubin 7.5 (H) 0.3 - 1.2 mg/dL   GFR, Estimated 49 (L) >60 mL/min    Comment: (NOTE) Calculated using the CKD-EPI Creatinine Equation (2021)    Anion gap 7 5 - 15    Comment: Performed at [facility], [place] [address], [place], [place] [address]  CBC with Differential/Platelet     Status: Abnormal   Collection Time: 10/03/20  6:04 AM  Result Value Ref Range   WBC 5.8 4.0 - 10.5 K/uL   RBC 2.25 (L) 3.87 - 5.11 MIL/uL   Hemoglobin 7.4 (L) 12.0 - 15.0 g/dL   HCT 23.4 (L) 36 - 46 %   MCV 104.0 (H) 80.0 - 100.0 fL   MCH 32.9 26.0 -  34.0 pg   MCHC 31.6 30.0 - 36.0 g/dL   RDW 18.6 (H) 11.5 - 15.5 %   Platelets 148 (L) 150 - 400 K/uL   nRBC 0.0 0.0 - 0.2 %   Neutrophils Relative % 81 %   Neutro Abs 4.8 1.7 - 7.7 K/uL   Lymphocytes Relative 9 %   Lymphs Abs 0.5 (L) 0.7 - 4.0 K/uL   Monocytes Relative 7 %   Monocytes Absolute 0.4 0.1 - 1.0 K/uL   Eosinophils Relative 1 %   Eosinophils Absolute 0.1 0.0 - 0.5 K/uL   Basophils Relative 1 %   Basophils Absolute 0.0 0.0 - 0.1 K/uL   Immature Granulocytes 1 %   Abs Immature Granulocytes 0.03 0.00 - 0.07 K/uL    Comment: Performed at [facility] [address] [place], [place] [address]  Glucose, capillary     Status: Abnormal   Collection Time: 10/03/20  8:25 AM  Result Value Ref Range   Glucose-Capillary 142 (H) 70 - 99 mg/dL    Comment: Glucose reference range applies only to samples taken after fasting for at least 8 hours.  Glucose, capillary     Status: Abnormal   Collection Time: 10/03/20  2:37 PM  Result Value Ref Range    Glucose-Capillary 132 (H) 70 - 99 mg/dL    Comment: Glucose reference range applies only to samples taken after fasting for at least 8 hours.  Glucose, capillary     Status: Abnormal   Collection Time: 10/03/20  5:07 PM  Result Value Ref Range   Glucose-Capillary 155 (H) 70 - 99 mg/dL    Comment: Glucose reference range applies only to samples taken after fasting for at least 8 hours.  Glucose, capillary     Status: Abnormal   Collection Time: 10/03/20  8:06 PM  Result Value Ref Range   Glucose-Capillary 173 (H) 70 - 99 mg/dL    Comment: Glucose reference range applies only to samples taken after fasting for at least 8 hours.  Glucose, capillary     Status: Abnormal   Collection Time: 10/03/20 11:57 PM  Result Value Ref Range   Glucose-Capillary 152 (H) 70 - 99 mg/dL    Comment: Glucose reference range applies only to samples taken after fasting for at least 8 hours.  Comprehensive metabolic panel     Status: Abnormal   Collection Time: 10/04/20 12:54 AM  Result Value Ref Range   Sodium 139 135 - 145 mmol/L   Potassium 4.7 3.5 - 5.1 mmol/L   Chloride 110 98 - 111 mmol/L   CO2 20 (L) 22 - 32 mmol/L   Glucose, Bld 154 (H) 70 - 99 mg/dL    Comment: Glucose reference range applies only to samples taken after fasting for at least 8 hours.   BUN 15 8 - 23 mg/dL   Creatinine, Ser 1.26 (H) 0.44 - 1.00 mg/dL   Calcium 8.7 (L) 8.9 - 10.3 mg/dL   Total Protein 5.0 (L) 6.5 - 8.1 g/dL   Albumin 1.9 (L) 3.5 - 5.0 g/dL   AST 54 (H) 15 - 41 U/L   ALT 48 (H) 0 - 44 U/L   Alkaline Phosphatase 600 (H) 38 - 126 U/L   Total Bilirubin 6.0 (H) 0.3 - 1.2 mg/dL   GFR, Estimated 41 (L) >60 mL/min    Comment: (NOTE) Calculated using the CKD-EPI Creatinine Equation (2021)    Anion gap 9 5 - 15    Comment: Performed at [facility], [place] [address], [place], [place] [address]  CBC with Differential/Platelet     Status: Abnormal   Collection Time: 10/04/20 12:54 AM  Result Value Ref Range   WBC  5.3 4.0 - 10.5 K/uL   RBC 2.32 (L) 3.87 - 5.11 MIL/uL   Hemoglobin 7.7 (L) 12.0 - 15.0 g/dL   HCT 24.1 (L) 36 - 46 %   MCV 103.9 (H) 80.0 - 100.0 fL   MCH 33.2 26.0 - 34.0 pg   MCHC 32.0 30.0 - 36.0 g/dL   RDW 18.5 (H) 11.5 - 15.5 %   Platelets 169 150 - 400 K/uL   nRBC 0.4 (H) 0.0 - 0.2 %   Neutrophils Relative % 90 %   Neutro Abs 4.7 1.7 - 7.7 K/uL   Lymphocytes Relative 7 %   Lymphs Abs 0.4 (L) 0.7 - 4.0 K/uL   Monocytes Relative 2 %   Monocytes Absolute 0.1 0.1 - 1.0 K/uL   Eosinophils Relative 0 %   Eosinophils Absolute 0.0 0.0 - 0.5 K/uL   Basophils Relative 0 %   Basophils Absolute 0.0 0.0 - 0.1 K/uL   Immature Granulocytes 1 %   Abs Immature Granulocytes 0.05 0.00 - 0.07 K/uL    Comment: Performed at [facility], [place] [address], [place], [place] [address]  Glucose, capillary     Status: Abnormal   Collection Time: 10/04/20  3:43 AM  Result Value Ref Range   Glucose-Capillary 125 (H) 70 - 99 mg/dL    Comment: Glucose reference range applies only to samples taken after fasting for at least 8 hours.  Glucose, capillary     Status: Abnormal   Collection Time: 10/04/20  8:22 AM  Result Value Ref Range   Glucose-Capillary 125 (H) 70 - 99 mg/dL    Comment: Glucose reference range applies only to samples taken after fasting for at least 8 hours.  Glucose, capillary     Status: Abnormal   Collection Time: 10/04/20 12:01 PM  Result Value Ref Range   Glucose-Capillary 123 (H) 70 - 99 mg/dL    Comment: Glucose reference range applies only to samples taken after fasting for at least 8 hours.    Studies/Results: DG ERCP BILIARY & PANCREATIC DUCTS  Result Date: 10/03/2020 CLINICAL DATA:  86 year old female with jaundice EXAM: ERCP TECHNIQUE: Multiple spot images obtained with the fluoroscopic device and submitted for interpretation post-procedure. FLUOROSCOPY TIME:  Fluoroscopy Time:  2 minutes 38 seconds COMPARISON:  MR 10/02/2020 FINDINGS: Limited intraoperative  fluoroscopic spot images during ERCP. Initial image demonstrates the endoscope projecting over the upper abdomen. Cannulation of the ampulla with safety wire in place and then retrograde infusion of contrast. Deployment of a retrieval balloon. IMPRESSION: Limited images during ERCP demonstrates partial opacification of the extrahepatic biliary ducts with deployment of a balloon retrieval catheter. Please refer to the dictated operative report for full details of intraoperative findings and procedure. Electronically Signed   By: Corrie Mckusick D.O.   On: 10/03/2020 14:08   MR ABDOMEN MRCP W WO CONTAST  Result Date: 10/02/2020 CLINICAL DATA:  Jaundice.  Biliary ductal dilatation on recent CT. EXAM: MRI ABDOMEN WITHOUT AND WITH CONTRAST (INCLUDING MRCP) TECHNIQUE: Multiplanar multisequence MR imaging of the abdomen was performed both before and after the administration of intravenous contrast. Heavily T2-weighted images of the biliary and pancreatic ducts were obtained, and three-dimensional MRCP images were rendered by post processing. CONTRAST:  5mL GADAVIST GADOBUTROL 1 MMOL/ML IV SOLN COMPARISON:  CT  on 10/01/2020 FINDINGS: Lower chest: No acute findings. Hepatobiliary: No hepatic masses identified. Multiple gallstones are seen measuring up to 1 cm, and layering gallbladder sludge is also noted. No evidence of acute cholecystitis. Diffuse biliary ductal dilatation is seen, with common bile duct measuring 17 mm in diameter. An 11 mm calculus is seen in the distal common bile duct adjacent to the ampulla on image 19/3. Pancreas:  No mass or inflammatory changes. Spleen:  Within normal limits in size and appearance. Adrenals/Urinary Tract: Multiple small bilateral renal cortical and sinus cysts are seen. A 7 cm subcapsular cyst is seen projecting from the lower pole of the left kidney. No renal masses identified. No evidence of hydronephrosis. Stomach/Bowel: A few tiny duodenal diverticula are seen at the junction  of the 2nd and 3rd portions. Otherwise unremarkable. Vascular/Lymphatic: No pathologically enlarged lymph nodes identified. No abdominal aortic aneurysm. Other:  None. Musculoskeletal:  No suspicious bone lesions identified. IMPRESSION: Cholelithiasis and gallbladder sludge. No evidence of acute cholecystitis. Diffuse biliary ductal dilatation, due to 11 mm calculus in the distal common bile duct adjacent to the ampulla. Electronically Signed   By: Marlaine Hind M.D.   On: 10/02/2020 16:10   ECHOCARDIOGRAM COMPLETE  Result Date: 10/03/2020    ECHOCARDIOGRAM REPORT   Patient Name:   Robin Guzman Date of Exam: 10/03/2020 Medical Rec #:  [id number]        Height:       64.0 in Accession #:    [id number]       Weight:       147.0 lb Date of Birth:  07-02-1928        BSA:          1.716 m Patient Age:    10 years         BP:           136/59 mmHg Patient Gender: F                HR:           65 bpm. Exam Location:  Inpatient Procedure: 2D Echo Indications:    cardiomyopathy 425.9  History:        Patient has prior history of Echocardiogram examinations, most                 recent 02/11/2016. CAD, Abnormal ECG; Risk Factors:Diabetes,                 Dyslipidemia and Hypertension.  Sonographer:    Johny Chess Referring Phys: Buford Dresser  Sonographer Comments: Image acquisition challenging due to patient behavioral factors. and Image acquisition challenging due to uncooperative patient. IMPRESSIONS  1. Left ventricular ejection fraction, by estimation, is 55 to 60%. The left ventricle has normal function. The left ventricle has no regional wall motion abnormalities. There is mild left ventricular hypertrophy. Left ventricular diastolic parameters are consistent with Grade I diastolic dysfunction (impaired relaxation).  2. Right ventricular systolic function is normal. The right ventricular size is normal. There is normal pulmonary artery systolic pressure. The estimated right ventricular systolic  pressure is 94.7 mmHg.  3. Left atrial size was mildly dilated.  4. The mitral valve is normal in structure. Trivial mitral valve regurgitation. No evidence of mitral stenosis.  5. The aortic valve is tricuspid. Aortic valve regurgitation is mild.  6. The inferior vena cava is normal in size with greater than 50% respiratory variability, suggesting right atrial pressure of 3 mmHg. FINDINGS  Left Ventricle:  Left ventricular ejection fraction, by estimation, is 55 to 60%. The left ventricle has normal function. The left ventricle has no regional wall motion abnormalities. The left ventricular internal cavity size was normal in size. There is  mild left ventricular hypertrophy. Left ventricular diastolic parameters are consistent with Grade I diastolic dysfunction (impaired relaxation). Right Ventricle: The right ventricular size is normal. No increase in right ventricular wall thickness. Right ventricular systolic function is normal. There is normal pulmonary artery systolic pressure. The tricuspid regurgitant velocity is 2.82 m/s, and  with an assumed right atrial pressure of 3 mmHg, the estimated right ventricular systolic pressure is 66.2 mmHg. Left Atrium: Left atrial size was mildly dilated. Right Atrium: Right atrial size was normal in size. Pericardium: Trivial pericardial effusion is present. Mitral Valve: The mitral valve is normal in structure. Mild mitral annular calcification. Trivial mitral valve regurgitation. No evidence of mitral valve stenosis. Tricuspid Valve: The tricuspid valve is normal in structure. Tricuspid valve regurgitation is trivial. Aortic Valve: The aortic valve is tricuspid. Aortic valve regurgitation is mild. Aortic regurgitation PHT measures 433 msec. Pulmonic Valve: The pulmonic valve was normal in structure. Pulmonic valve regurgitation is not visualized. Aorta: The aortic root is normal in size and structure. Venous: The inferior vena cava is normal in size with greater than 50%  respiratory variability, suggesting right atrial pressure of 3 mmHg. IAS/Shunts: No atrial level shunt detected by color flow Doppler.  LEFT VENTRICLE PLAX 2D LVIDd:         4.30 cm Diastology LVIDs:         2.70 cm LV e' medial:    4.68 cm/s LV PW:         1.00 cm LV E/e' medial:  18.5 LV IVS:        1.10 cm LV e' lateral:   6.09 cm/s                        LV E/e' lateral: 14.2  RIGHT VENTRICLE             IVC RV S prime:     13.10 cm/s  IVC diam: 1.40 cm LEFT ATRIUM             Index       RIGHT ATRIUM           Index LA diam:        3.00 cm 1.75 cm/m  RA Area:     11.00 cm LA Vol (A2C):   54.6 ml 31.81 ml/m RA Volume:   23.00 ml  13.40 ml/m LA Vol (A4C):   46.1 ml 26.86 ml/m LA Biplane Vol: 52.3 ml 30.47 ml/m  AORTIC VALVE LVOT Vmax:   124.00 cm/s LVOT Vmean:  83.300 cm/s LVOT VTI:    0.319 m AI PHT:      433 msec  AORTA Ao Root diam: 3.40 cm Ao Asc diam:  3.50 cm MITRAL VALVE                TRICUSPID VALVE MV Area (PHT): 2.24 cm     TR Peak grad:   31.8 mmHg MV Decel Time: 338 msec     TR Vmax:        282.00 cm/s MV E velocity: 86.60 cm/s MV A velocity: 128.00 cm/s  SHUNTS MV E/A ratio:  0.68         Systemic VTI: 0.32 m Loralie Champagne MD Electronically signed by Loralie Champagne MD  Signature Date/Time: 10/03/2020/12:26:17 PM    Final     Medications: I have reviewed the patient's current medications.  Assessment: Status post ERCP for jaundice, elevated enzymes and common bile duct stones Status post sphincterotomy with removal of stones and sludge T bili 6/AST 50/ALT 48/ALP 600 Superficial proximal duodenal ulcers noted  Macrocytic anemia, hemoglobin 7.7, MCV 103.9  Plan: Okay to advance diet As per documentation family has decided not to pursue surgery. We will sign off from GI standpoint, okay to DC home from our standpoint.  Ronnette Juniper, MD 10/04/2020, 2:26 PM

## 2020-10-05 DIAGNOSIS — M545 Low back pain, unspecified: Secondary | ICD-10-CM | POA: Diagnosis not present

## 2020-10-05 DIAGNOSIS — R17 Unspecified jaundice: Secondary | ICD-10-CM | POA: Diagnosis not present

## 2020-10-05 DIAGNOSIS — I700 Atherosclerosis of aorta: Secondary | ICD-10-CM | POA: Diagnosis not present

## 2020-10-05 DIAGNOSIS — K8050 Calculus of bile duct without cholangitis or cholecystitis without obstruction: Secondary | ICD-10-CM | POA: Diagnosis not present

## 2020-10-05 LAB — PREPARE RBC (CROSSMATCH)

## 2020-10-05 LAB — COMPREHENSIVE METABOLIC PANEL
ALT: 37 U/L (ref 0–44)
AST: 33 U/L (ref 15–41)
Albumin: 1.9 g/dL — ABNORMAL LOW (ref 3.5–5.0)
Alkaline Phosphatase: 460 U/L — ABNORMAL HIGH (ref 38–126)
Anion gap: 7 (ref 5–15)
BUN: 18 mg/dL (ref 8–23)
CO2: 22 mmol/L (ref 22–32)
Calcium: 8.3 mg/dL — ABNORMAL LOW (ref 8.9–10.3)
Chloride: 111 mmol/L (ref 98–111)
Creatinine, Ser: 1.38 mg/dL — ABNORMAL HIGH (ref 0.44–1.00)
GFR, Estimated: 36 mL/min — ABNORMAL LOW (ref >60)
Glucose, Bld: 171 mg/dL — ABNORMAL HIGH (ref 70–99)
Potassium: 3.6 mmol/L (ref 3.5–5.1)
Sodium: 140 mmol/L (ref 135–145)
Total Bilirubin: 3.6 mg/dL — ABNORMAL HIGH (ref 0.3–1.2)
Total Protein: 5 g/dL — ABNORMAL LOW (ref 6.5–8.1)

## 2020-10-05 LAB — HEMOGLOBIN AND HEMATOCRIT, BLOOD
HCT: 28.1 % — ABNORMAL LOW (ref 36.0–46.0)
Hemoglobin: 8.9 g/dL — ABNORMAL LOW (ref 12.0–15.0)

## 2020-10-05 LAB — RETICULOCYTES
Immature Retic Fract: 25.1 % — ABNORMAL HIGH (ref 2.3–15.9)
RBC.: 2.38 MIL/uL — ABNORMAL LOW (ref 3.87–5.11)
Retic Count, Absolute: 62.6 10*3/uL (ref 19.0–186.0)
Retic Ct Pct: 2.6 % (ref 0.4–3.1)

## 2020-10-05 LAB — CBC WITH DIFFERENTIAL/PLATELET
Abs Immature Granulocytes: 0.06 10*3/uL (ref 0.00–0.07)
Basophils Absolute: 0 10*3/uL (ref 0.0–0.1)
Basophils Relative: 0 %
Eosinophils Absolute: 0.1 10*3/uL (ref 0.0–0.5)
Eosinophils Relative: 1 %
HCT: 22.9 % — ABNORMAL LOW (ref 36.0–46.0)
Hemoglobin: 7.3 g/dL — ABNORMAL LOW (ref 12.0–15.0)
Immature Granulocytes: 1 %
Lymphocytes Relative: 15 %
Lymphs Abs: 0.9 10*3/uL (ref 0.7–4.0)
MCH: 33.3 pg (ref 26.0–34.0)
MCHC: 31.9 g/dL (ref 30.0–36.0)
MCV: 104.6 fL — ABNORMAL HIGH (ref 80.0–100.0)
Monocytes Absolute: 0.4 10*3/uL (ref 0.1–1.0)
Monocytes Relative: 6 %
Neutro Abs: 4.9 10*3/uL (ref 1.7–7.7)
Neutrophils Relative %: 77 %
Platelets: 156 10*3/uL (ref 150–400)
RBC: 2.19 MIL/uL — ABNORMAL LOW (ref 3.87–5.11)
RDW: 18.6 % — ABNORMAL HIGH (ref 11.5–15.5)
WBC: 6.4 10*3/uL (ref 4.0–10.5)
nRBC: 0.5 % — ABNORMAL HIGH (ref 0.0–0.2)

## 2020-10-05 LAB — FERRITIN: Ferritin: 326 ng/mL — ABNORMAL HIGH (ref 11–307)

## 2020-10-05 LAB — ABO/RH: ABO/RH(D): A POS

## 2020-10-05 LAB — IRON AND TIBC
Iron: 84 ug/dL (ref 28–170)
Saturation Ratios: 46 % — ABNORMAL HIGH (ref 10.4–31.8)
TIBC: 182 ug/dL — ABNORMAL LOW (ref 250–450)
UIBC: 98 ug/dL

## 2020-10-05 LAB — GLUCOSE, CAPILLARY
Glucose-Capillary: 148 mg/dL — ABNORMAL HIGH (ref 70–99)
Glucose-Capillary: 139 mg/dL — ABNORMAL HIGH (ref 70–99)
Glucose-Capillary: 212 mg/dL — ABNORMAL HIGH (ref 70–99)
Glucose-Capillary: 154 mg/dL — ABNORMAL HIGH (ref 70–99)
Glucose-Capillary: 131 mg/dL — ABNORMAL HIGH (ref 70–99)

## 2020-10-05 LAB — FOLATE: Folate: 23.1 ng/mL (ref >5.9)

## 2020-10-05 LAB — VITAMIN B12: Vitamin B-12: 315 pg/mL (ref 180–914)

## 2020-10-05 MED ORDER — HALOPERIDOL LACTATE 5 MG/ML IJ SOLN
2.0000 mg | Freq: Four times a day (QID) | INTRAMUSCULAR | Status: DC | PRN
  Administered 2020-10-05: 2 mg via INTRAVENOUS
  Filled 2020-10-05: qty 1

## 2020-10-05 MED ORDER — PANTOPRAZOLE SODIUM 40 MG PO TBEC
40.0000 mg | DELAYED_RELEASE_TABLET | Freq: Two times a day (BID) | ORAL | Status: DC
  Administered 2020-10-06: 40 mg via ORAL
  Filled 2020-10-05 (×2): qty 1

## 2020-10-05 MED ORDER — SODIUM CHLORIDE 0.9% IV SOLUTION: Freq: Once | INTRAVENOUS | Status: DC

## 2020-10-05 NOTE — Progress Notes (Addendum)
PROGRESS NOTE  Robin Guzman:[id number] DOB: 24-May-1928 DOA: 10/01/2020 PCP: Celene Squibb, MD  Brief History   Robin Guzman  is a 86 y.o. female,with medical history significant forhypertension, hyperlipidemia, type 2 diabetes mellitus, CAD status post NSTEMI 07/2017 cath with mild nonobstructive disease, LVEF 25 to 30% with wall motion abnormalities suggesting stress-induced cardiomyopathy and elevated LVEDP,LBBB, hard of hearing and dementia. -Patient was sent by ED for evaluation of jaundice, patient herself cannot provide any complaints, family went to visit patient in the facility, they noted her to be jaundiced, and her oxygen level low, labs were significant for elevated bilirubin, so she was sent to ED for further evaluation, patient with advanced dementia, herself cannot provide any complaints. - in ED for total bilirubin of 8.3, AST of 98, ALT of 69, with lipase of 27, creatinine was at baseline at 1.01 had no leukocytosis, her hemoglobin lower than baseline at 8.4, CT abdomen pelvis was significant for mild intrahepatic biliary duct dilation with evidence of 9 mm calculus in the downstream common bile duct.   [facility] consulted to admit for further evaluation and treatment. Gastroenterology has been consulted for elevated LFT's and possible ERCP. Cardiology has been consulted and has cleared the patient for possible ERCP.  MRCP was performed on 10/02/2020 and confirmed the presence of an 27mm stone in the distal CBD. The patient underwent ERCP this morning with sphincterotomy and removal of stones. General surgery was consulted for consideration of laparoscopic cholecystectomy. Dr. Maxwell Caul evaluated the patient and discussed the option with the family. They did not feel that the benefit of surgery at this time was worth the risk to the patient.  ERCP demonstrated duodenal ulcer. The patient underwent sphincterotomy with removal of stone. She was returned to her room on  clear liquid diet. She has been started on protonix.  Hemoglobin was 7.3 this morning. Given the patient's cardiac history she will be transfused with 1 unit of PRBC's.  Consultants  . Gastroenterology . Cardiology  Procedures  . ERCP with sphincterotomy  Antibiotics   Anti-infectives (From admission, onward)   Start     Dose/Rate Route Frequency Ordered Stop   10/02/20 0000  piperacillin-tazobactam (ZOSYN) IVPB 3.375 g        3.375 g 12.5 mL/hr over 240 Minutes Intravenous Every 8 hours 10/01/20 1926     10/01/20 1800  piperacillin-tazobactam (ZOSYN) IVPB 3.375 g        3.375 g 100 mL/hr over 30 Minutes Intravenous  Once 10/01/20 1750 10/01/20 1945      Subjective  The patient is sitting up at bedside. She has no new complaints.  Objective   Vitals:  Vitals:   10/05/20 1409 10/05/20 1447  BP: 131/65 140/62  Pulse: (!) 55 (!) 56  Resp: 16 14  Temp: 97.7 F (36.5 C) 97.6 F (36.4 C)  SpO2: 100% 99%    Exam:  Constitutional:  The patient is resting quietly.  No acute distress. Respiratory:  . No increased work of breathing. . No wheezes, rales, or rhonchi . No tactile fremitus Cardiovascular:  . Regular rate and rhythm . No murmurs, ectopy, or gallups. . No lateral PMI. No thrills. Abdomen:  . Abdomen is soft, non-tender, non-distended . No hernias, masses, or organomegaly . Normoactive bowel sounds.  Musculoskeletal:  . No cyanosis, clubbing, or edema Skin:  . Icteric without lesions or ulcers . palpation of skin: no induration or nodules Neurologic:  Unable to evaluate due to the patient's inability to  cooperate with exam. Psychiatric:  . Unable to evaluate due to the patient's inability to cooperate with exam.  I have personally reviewed the following:   Today's Data  . Vitals, CMP, CBC  Imaging  . CT abdomen and pelvis: 87mm Calculus in CBD . MRCP: 79mm  Stone in distal CBD  Cardiology Data  . EKG: Unchanged from previous  Scheduled  Meds: . sodium chloride   Intravenous Once  . carvedilol  25 mg Oral BID WC  . insulin aspart  0-6 Units Subcutaneous Q4H  . pantoprazole  40 mg Oral BID   Continuous Infusions: . sodium chloride 75 mL/hr at 10/05/20 0659  . piperacillin-tazobactam (ZOSYN)  IV 3.375 g (10/05/20 6237)    Active Problems:   Chronic diastolic heart failure ([place])   Essential hypertension   Controlled type 2 diabetes mellitus without complication, without long-term current use of insulin (HCC)   Dyslipidemia   Atherosclerosis of aorta (HCC)   Back pain   Choledocholithiasis   LOS: 4 days   Choledocholithiasis with possible cholangitis/obstructive jaundice: 67mm stone seen in distal CBD on CT. 75mm on MRCP 10/02/2020. She presents with jaundice, but she is afebrile, with no leukocytosis, she will be kept empirically on IV Zosyn, she will be kept n.p.o., and on IV fluids. MRCP was performed on 10/02/2020 and confirmed the presence of an 92mm stone in the distal CBD. The patient underwent ERCP this morning with sphincterotomy and removal of stones. She was also found to have a duodenal ulcer. General surgery was consulted for consideration of laparoscopic cholecystectomy. Dr. Maxwell Caul evaluated the patient and discussed the option with the family. They did not feel that the benefit of surgery at this time was worth the risk to the patient. Will resume chemical prophylaxis as per surgery's recommendations. She was started on protonix.   CAD/ischemic cardiomyopathy: Noted. The patient has a history of CAD status post NSTEMI 07/2017 cath with mild nonobstructive disease, LVEF 25 to 30% with wall motion abnormalities suggesting stress-induced cardiomyopathy and elevated LVEDP. EKG is unchanged from 06/2020. Continue with Coreg, hold aspirin anticipation for procedure. Cardiology cleared the patient for ERCP.  Anemia: Likely combination of chronic blood loss from duodenal ulcer and nutritional deficits. Due to her ischemic  cardiomyopathy, I will transfuse the patient with one unit of PRBC's. Hemoglobin this morning was 7.3. Anemia panel demonstrates elevated ferritin and low TIBC with adequate iron stores, indicating likely chronic disease as a source of anemia.  Hypertension: Continue with Coreg.  Dementia: Continue with supportive care.  9.8 cm left adnexal cyst to dental finding on imaging. Radiology recommend follow-up US in 6-12 months.  I have seen and examined this patient myself. I have spent 35 minutes in her evaluation and care.  DVT Prophylaxis Heparin Code Status:  DNR Family Communication: Daughter at bedside. All questions answered to the best of my ability. Disposition: Status is: Inpatient  Remains inpatient appropriate because:Inpatient level of care appropriate due to severity of illness  Dispo: The patient is from: SNF              Anticipated d/c is to: SNF              Anticipated d/c date is: 1 day              Patient currently is not medically stable to d/c.  Danille Oppedisano, DO [facility] Direct contact: see [email or web address]  7PM-7AM contact night coverage as above 10/05/2020, 4:33 PM  LOS: 1 day

## 2020-10-06 ENCOUNTER — Encounter (HOSPITAL_COMMUNITY): Payer: Self-pay | Admitting: Gastroenterology

## 2020-10-06 DIAGNOSIS — I700 Atherosclerosis of aorta: Secondary | ICD-10-CM | POA: Diagnosis not present

## 2020-10-06 DIAGNOSIS — D638 Anemia in other chronic diseases classified elsewhere: Secondary | ICD-10-CM | POA: Diagnosis not present

## 2020-10-06 DIAGNOSIS — R17 Unspecified jaundice: Secondary | ICD-10-CM | POA: Diagnosis not present

## 2020-10-06 DIAGNOSIS — M545 Low back pain, unspecified: Secondary | ICD-10-CM | POA: Diagnosis not present

## 2020-10-06 LAB — TYPE AND SCREEN
ABO/RH(D): A POS
Antibody Screen: NEGATIVE
Unit division: 0

## 2020-10-06 LAB — CBC WITH DIFFERENTIAL/PLATELET
Abs Immature Granulocytes: 0.09 10*3/uL — ABNORMAL HIGH (ref 0.00–0.07)
Basophils Absolute: 0 10*3/uL (ref 0.0–0.1)
Basophils Relative: 1 %
Eosinophils Absolute: 0.1 10*3/uL (ref 0.0–0.5)
Eosinophils Relative: 2 %
HCT: 25.5 % — ABNORMAL LOW (ref 36.0–46.0)
Hemoglobin: 8.1 g/dL — ABNORMAL LOW (ref 12.0–15.0)
Immature Granulocytes: 2 %
Lymphocytes Relative: 19 %
Lymphs Abs: 0.9 10*3/uL (ref 0.7–4.0)
MCH: 31.3 pg (ref 26.0–34.0)
MCHC: 31.8 g/dL (ref 30.0–36.0)
MCV: 98.5 fL (ref 80.0–100.0)
Monocytes Absolute: 0.4 10*3/uL (ref 0.1–1.0)
Monocytes Relative: 8 %
Neutro Abs: 3.5 10*3/uL (ref 1.7–7.7)
Neutrophils Relative %: 68 %
Platelets: 137 10*3/uL — ABNORMAL LOW (ref 150–400)
RBC: 2.59 MIL/uL — ABNORMAL LOW (ref 3.87–5.11)
RDW: 24.3 % — ABNORMAL HIGH (ref 11.5–15.5)
WBC: 5 10*3/uL (ref 4.0–10.5)
nRBC: 0.6 % — ABNORMAL HIGH (ref 0.0–0.2)

## 2020-10-06 LAB — BPAM RBC
Blood Product Expiration Date: 202112122359
ISSUE DATE / TIME: 202111141417
Unit Type and Rh: 6200

## 2020-10-06 LAB — COMPREHENSIVE METABOLIC PANEL
ALT: 29 U/L (ref 0–44)
AST: 30 U/L (ref 15–41)
Albumin: 1.8 g/dL — ABNORMAL LOW (ref 3.5–5.0)
Alkaline Phosphatase: 364 U/L — ABNORMAL HIGH (ref 38–126)
Anion gap: 8 (ref 5–15)
BUN: 10 mg/dL (ref 8–23)
CO2: 20 mmol/L — ABNORMAL LOW (ref 22–32)
Calcium: 8 mg/dL — ABNORMAL LOW (ref 8.9–10.3)
Chloride: 112 mmol/L — ABNORMAL HIGH (ref 98–111)
Creatinine, Ser: 1.11 mg/dL — ABNORMAL HIGH (ref 0.44–1.00)
GFR, Estimated: 47 mL/min — ABNORMAL LOW (ref >60)
Glucose, Bld: 117 mg/dL — ABNORMAL HIGH (ref 70–99)
Potassium: 3.5 mmol/L (ref 3.5–5.1)
Sodium: 140 mmol/L (ref 135–145)
Total Bilirubin: 3 mg/dL — ABNORMAL HIGH (ref 0.3–1.2)
Total Protein: 4.4 g/dL — ABNORMAL LOW (ref 6.5–8.1)

## 2020-10-06 LAB — GLUCOSE, CAPILLARY
Glucose-Capillary: 141 mg/dL — ABNORMAL HIGH (ref 70–99)
Glucose-Capillary: 161 mg/dL — ABNORMAL HIGH (ref 70–99)
Glucose-Capillary: 167 mg/dL — ABNORMAL HIGH (ref 70–99)
Glucose-Capillary: 168 mg/dL — ABNORMAL HIGH (ref 70–99)

## 2020-10-06 MED ORDER — PANTOPRAZOLE SODIUM 40 MG PO TBEC: 40.0000 mg | DELAYED_RELEASE_TABLET | Freq: Two times a day (BID) | ORAL | 0 refills | Status: AC

## 2020-10-06 NOTE — Progress Notes (Signed)
CSW spoke with Robin Guzman and Confirmed Robin Guzman can return. Fl2 and D/C summary faxed to [phone].   CSW confirmed that Robin Guzman active with [facility] Robin Guzman with [facility] and notified them that Robin Guzman is d/cing today.   CSW spoke with Robin Guzman daughter who will be transporting Robin Guzman to [place].

## 2020-10-06 NOTE — Progress Notes (Signed)
Discharge instructions reviewed with pt's daughter, Dalphene and instructed on where to pick up prescription.  Pt's daughter verbalized understanding and had no questions.  Pt's daughter aware to take AVS with her and give to [facility].  Pt discharged in stable condition via wheelchair with daughter to [place].  Robin Guzman

## 2020-10-06 NOTE — Anesthesia Postprocedure Evaluation (Signed)
Anesthesia Post Note  Patient: Robin Guzman  Procedure(s) Performed: ENDOSCOPIC RETROGRADE CHOLANGIOPANCREATOGRAPHY (ERCP) (N/A ) SPHINCTEROTOMY REMOVAL OF STONES     Patient location during evaluation: PACU Anesthesia Type: General Level of consciousness: awake and alert Pain management: pain level controlled Vital Signs Assessment: post-procedure vital signs reviewed and stable Respiratory status: spontaneous breathing, nonlabored ventilation and respiratory function stable Cardiovascular status: blood pressure returned to baseline and stable Postop Assessment: no apparent nausea or vomiting Anesthetic complications: no   No complications documented.  Last Vitals:  Vitals:   10/05/20 1957 10/06/20 1426  BP: (!) 141/84 (!) 144/68  Pulse: 62 (!) 59  Resp: 17 16  Temp: 37 C 36.8 C  SpO2: 99% 100%    Last Pain:  Vitals:   10/06/20 1426  TempSrc: Oral  PainSc:    Pain Goal:                   Lynda Rainwater

## 2020-10-06 NOTE — Consult Note (Signed)
   [facility] Inpatient Consult   10/06/2020  [name] [birth year] [phone]  [place]  Accountable Care Organization [ACO] Patient: [employer] Medicare  PCP: Celene Squibb, MD and this provider is listed for the transition of care follow up.  Reviewed for disposition and patient is returning to [facility]  Plan: No current barriers noted to care for transition.  For questions, please contact:  Natividad Brood, RN BSN [facility] Liaison  [phone] business mobile phone Toll free office [phone]  Fax number: [phone] [email or web address]

## 2020-10-06 NOTE — [place] FL2 (Signed)
[place] MEDICAID FL2 LEVEL OF CARE SCREENING TOOL     IDENTIFICATION  Patient Name: Robin Guzman Birthdate: 05-23-1928 Sex: female Admission Date (Current Location): 10/01/2020  [facility] [place] Number:  Herbalist and Address:  [facility] [place], [place] [address] [place], [place] [address]      Provider Number: [id number]  Attending Physician Name and Address:  Karie Kirks, DO  Relative Name and Phone Number:  Dabbs,Carolyn (Daughter) [phone] [facility])    Current Level of Care: Hospital Recommended Level of Care: [place], Memory Care Prior Approval Number:    Date Approved/Denied:   PASRR Number:    Discharge Plan: Other (Comment) (ALF)    Current Diagnoses: Patient Active Problem List   Diagnosis Date Noted  . Choledocholithiasis 10/01/2020  . Compression fracture of L1 vertebra (HCC) 06/23/2020  . Leukocytosis 06/23/2020  . Macrocytic anemia 06/23/2020  . Hyperglycemia due to diabetes mellitus ([place]) 06/23/2020  . Thrombocytopenia ([place]) 06/23/2020  . Back pain 06/23/2020  . Fall at home, initial encounter 06/23/2020  . SVT (supraventricular tachycardia) ([place]) 10/23/2018  . PAT (paroxysmal atrial tachycardia) ([place]) 04/23/2018  . LBBB (left bundle branch block) 04/23/2018  . Dyslipidemia 04/23/2018  . Atherosclerosis of aorta ([place]) 04/23/2018  . Acute systolic heart failure ([place]) 07/28/2017  . Sundowning 07/28/2017  . NSTEMI (non-ST elevated myocardial infarction) ([place]) 07/26/2017  . Closed 3-part fracture of proximal humerus, left, with routine healing, subsequent encounter 12/02/2016  . UTI (urinary tract infection) 12/25/2013  . Dyspnea 12/25/2013  . Near syncope 12/25/2013  . CAD (coronary artery disease) 12/25/2013  . Chronic diastolic heart failure ([place]) 08/07/2013  . Essential hypertension 08/07/2013  . Controlled type 2 diabetes mellitus without complication, without long-term current use of  insulin ([place]) 08/07/2013  . Hypercholesteremia 08/07/2013    Orientation RESPIRATION BLADDER Height & Weight     Self  Normal Continent, External catheter Weight: 147 lb (66.7 kg) Height:  5\' 4"  (162.6 cm)  BEHAVIORAL SYMPTOMS/MOOD NEUROLOGICAL BOWEL NUTRITION STATUS  Other (Comment) (sundowning)   Continent Diet  low sodium heart healthy    AMBULATORY STATUS COMMUNICATION OF NEEDS Skin   Limited Assist Verbally Surgical wounds (Skin tear acquired in Endo during procedure, left leg)                       Personal Care Assistance Level of Assistance  Bathing, Feeding, Dressing Bathing Assistance: Limited assistance Feeding assistance: Limited assistance Dressing Assistance: Limited assistance     Functional Limitations Info  Sight, Hearing, Speech Sight Info: Adequate Hearing Info: Impaired Speech Info: Adequate    SPECIAL CARE FACTORS FREQUENCY  PT (By licensed PT), OT (By licensed OT)     PT Frequency: 2x/week OT Frequency: 2x/week            Contractures Contractures Info: Not present    Additional Factors Info  Code Status, Allergies Code Status Info: DNR Allergies Info: Daypro (oxaprozin), ibuprofen           Current Medications (10/06/2020):  This is the current hospital active medication list Current Facility-Administered Medications  Medication Dose Route Frequency Provider Last Rate Last Admin  . 0.9 %  sodium chloride infusion (Manually program via Guardrails IV Fluids)   Intravenous Once Swayze, Ava, DO      . 0.9 %  sodium chloride infusion   Intravenous Continuous Arta Silence, MD 75 mL/hr at 10/06/20 0847 New Bag at 10/06/20 0847  . carvedilol (  COREG) tablet 25 mg  25 mg Oral BID WC Arta Silence, MD   25 mg at 10/06/20 0837  . haloperidol lactate (HALDOL) injection 2 mg  2 mg Intravenous Q6H PRN Swayze, Ava, DO   2 mg at 10/05/20 1940  . HYDROcodone-acetaminophen (NORCO/VICODIN) 5-325 MG per tablet 1-2 tablet  1-2 tablet Oral Q4H PRN  Arta Silence, MD      . insulin aspart (novoLOG) injection 0-6 Units  0-6 Units Subcutaneous Q4H Arta Silence, MD   1 Units at 10/06/20 [phone]  . pantoprazole (PROTONIX) EC tablet 40 mg  40 mg Oral BID Swayze, Ava, DO   40 mg at 10/06/20 1038  . piperacillin-tazobactam (ZOSYN) IVPB 3.375 g  3.375 g Intravenous Smiley Houseman, MD 12.5 mL/hr at 10/06/20 0848 3.375 g at 10/06/20 0848     Discharge Medications: Discharge Instructions      Discharge Instructions    Activity as tolerated - No restrictions   Complete by: As directed    Call MD for:   Complete by: As directed    Jaundice   Call MD for:  persistant nausea and vomiting   Complete by: As directed    Call MD for:  severe uncontrolled pain   Complete by: As directed    Diet - low sodium heart healthy   Complete by: As directed    Discharge instructions   Complete by: As directed    Follow up with PCP in 7-10 days. Chemistry, CBC, and hepatic panel to be drawn on that visit and reported to PCP.   Increase activity slowly   Complete by: As directed    No wound care   Complete by: As directed           Allergies as of 10/06/2020      Reactions   Daypro [oxaprozin] Shortness Of Breath, Other (See Comments)   weakness   Advil [ibuprofen] Other (See Comments)   weakness         Medication List    STOP taking these medications   famotidine 20 MG tablet Commonly known as: PEPCID     TAKE these medications   acetaminophen 500 MG tablet Commonly known as: TYLENOL Take 1,000 mg by mouth in the morning and at bedtime.   aspirin EC 81 MG tablet Take 1 tablet (81 mg total) by mouth daily with breakfast.   atorvastatin 10 MG tablet Commonly known as: LIPITOR Take 10 mg by mouth daily.   carvedilol 25 MG tablet Commonly known as: COREG Take 1 tablet (25 mg total) by mouth 2 (two) times daily with a meal.   Centrum Adults Tabs Take 1 tablet by mouth daily.   ferrous  sulfate 325 (65 FE) MG tablet Take 325 mg by mouth daily.   HYDROcodone-acetaminophen 5-325 MG tablet Commonly known as: NORCO/VICODIN Take 0.5 tablets by mouth every 4 (four) hours as needed for moderate pain or severe pain.   insulin aspart 100 UNIT/ML FlexPen Commonly known as: NOVOLOG Inject 0-6 Units into the skin 3 (three) times daily with meals. insulin aspart (novoLOG) injection 0-6 Units 0-6 Units Subcutaneous, 3 times daily with meals CBG < 70: Implement Hypoglycemia Standing Orders and refer to Hypoglycemia Standing Orders sidebar report  CBG 70 - 120: 0 unit CBG 121 - 150: 0 unit  CBG 151 - 200: 0 unit CBG 201 - 250: 1 units CBG 251 - 300:  2units CBG 301 - 350:  3 units  CBG 351 - 400:  5  units  CBG > 400: 6 units   LORazepam 0.5 MG tablet Commonly known as: Ativan Take 1 tablet (0.5 mg total) by mouth every 12 (twelve) hours as needed for anxiety or sleep.   metFORMIN 500 MG tablet Commonly known as: GLUCOPHAGE Take 500 mg by mouth 2 (two) times daily with a meal.   pantoprazole 40 MG tablet Commonly known as: PROTONIX Take 1 tablet (40 mg total) by mouth 2 (two) times daily.   polyethylene glycol 17 g packet Commonly known as: MiraLax Take 17 g by mouth daily.   senna-docusate 8.6-50 MG tablet Commonly known as: Senokot-S Take 2 tablets by mouth 2 (two) times daily.        Relevant Imaging Results:  Relevant Lab Results:   Additional Information SSN [place], [place]

## 2020-10-06 NOTE — Discharge Summary (Signed)
Physician Discharge Summary  Robin Guzman IEP:[id number] DOB: Apr 20, 1928 DOA: 10/01/2020  PCP: Celene Squibb, MD  Admit date: 10/01/2020 Discharge date: 10/06/2020  Recommendations for Outpatient Follow-up:  Follow up with PCP in 7-10 days. Chemistry, CBC, and hepatic panel to be drawn on that visit and reported to PCP.  Discharge Diagnoses: Principal diagnosis is #1 1. Choledocholithiasis 2. Jaundice 3. Duodenal ulcer 4. CAD/ischemic cardiomyopathy 5. Anemia - chronic blood loss/chronic disease 6. Hypertension 7. Dementia  Discharge Condition: Fair  Disposition: SNF  Diet recommendation: Heart healthy  Filed Weights   10/01/20 1419  Weight: 66.7 kg    History of present illness:  Robin Guzman  is a 86 y.o. female,with medical history significant forhypertension, hyperlipidemia, type 2 diabetes mellitus, CAD status post NSTEMI 07/2017 cath with mild nonobstructive disease, LVEF 25 to 30% with wall motion abnormalities suggesting stress-induced cardiomyopathy and elevated LVEDP,LBBB, hard of hearing and dementia. -Patient was sent by ED for evaluation of jaundice, patient herself cannot provide any complaints, family went to visit patient in the facility, they noted her to be jaundiced, and her oxygen level low, labs were significant for elevated bilirubin, so she was sent to ED for further evaluation, patient with advanced dementia, herself cannot provide any complaints. - in ED for total bilirubin of 8.3, AST of 98, ALT of 69, with lipase of 27, creatinine was at baseline at 1.01 had no leukocytosis, her hemoglobin lower than baseline at 8.4, CT abdomen pelvis was significant for mild intrahepatic biliary duct dilation with evidence of 9 mm calculus in the downstream common bile duct.  [facility] consulted to admit  Hospital Course:  [facility] consulted to admit for further evaluation and treatment. Gastroenterology has been consulted for elevated LFT's and  possible ERCP. Cardiology has been consulted and has cleared the patient for possible ERCP.  MRCP was performed on 10/02/2020 and confirmed the presence of an 25mm stone in the distal CBD. The patient underwent ERCP this morning with sphincterotomy and removal of stones. General surgery was consulted for consideration of laparoscopic cholecystectomy. Dr. Maxwell Caul evaluated the patient and discussed the option with the family. They did not feel that the benefit of surgery at this time was worth the risk to the patient.  ERCP demonstrated duodenal ulcer. The patient underwent sphincterotomy with removal of stone. She was returned to her room on clear liquid diet. She has been started on protonix.  Hemoglobin was 7.3 this morning. Given the patient's cardiac history she will be transfused with 1 unit of PRBC's.  Hemoglobin responded appropriately to transfusion. The patient will be discharged to SNF today.  Today's assessment: S: The patient is resting comfortably. No new complaints. O: Vitals:  Vitals:   10/05/20 1649 10/05/20 1957  BP: 135/66 (!) 141/84  Pulse: (!) 59 62  Resp: 16 17  Temp: 98 F (36.7 C) 98.6 F (37 C)  SpO2: 99% 99%   Exam:  Constitutional:  The patient is resting quietly.  No acute distress. Respiratory:   No increased work of breathing.  No wheezes, rales, or rhonchi  No tactile fremitus Cardiovascular:   Regular rate and rhythm  No murmurs, ectopy, or gallups.  No lateral PMI. No thrills. Abdomen:   Abdomen is soft, non-tender, non-distended  No hernias, masses, or organomegaly  Normoactive bowel sounds.  Musculoskeletal:   No cyanosis, clubbing, or edema Skin:   Icteric without lesions or ulcers  palpation of skin: no induration or nodules Neurologic:  Unable to evaluate due  to the patient's inability to cooperate with exam. Psychiatric:   Unable to evaluate due to the patient's inability to cooperate with exam.  Discharge  Instructions  Discharge Instructions    Activity as tolerated - No restrictions   Complete by: As directed    Call MD for:   Complete by: As directed    Jaundice   Call MD for:  persistant nausea and vomiting   Complete by: As directed    Call MD for:  severe uncontrolled pain   Complete by: As directed    Diet - low sodium heart healthy   Complete by: As directed    Discharge instructions   Complete by: As directed    Follow up with PCP in 7-10 days. Chemistry, CBC, and hepatic panel to be drawn on that visit and reported to PCP.   Increase activity slowly   Complete by: As directed    No wound care   Complete by: As directed      Allergies as of 10/06/2020      Reactions   Daypro [oxaprozin] Shortness Of Breath, Other (See Comments)   weakness   Advil [ibuprofen] Other (See Comments)   weakness      Medication List    STOP taking these medications   famotidine 20 MG tablet Commonly known as: PEPCID     TAKE these medications   acetaminophen 500 MG tablet Commonly known as: TYLENOL Take 1,000 mg by mouth in the morning and at bedtime.   aspirin EC 81 MG tablet Take 1 tablet (81 mg total) by mouth daily with breakfast.   atorvastatin 10 MG tablet Commonly known as: LIPITOR Take 10 mg by mouth daily.   carvedilol 25 MG tablet Commonly known as: COREG Take 1 tablet (25 mg total) by mouth 2 (two) times daily with a meal.   Centrum Adults Tabs Take 1 tablet by mouth daily.   ferrous sulfate 325 (65 FE) MG tablet Take 325 mg by mouth daily.   HYDROcodone-acetaminophen 5-325 MG tablet Commonly known as: NORCO/VICODIN Take 0.5 tablets by mouth every 4 (four) hours as needed for moderate pain or severe pain.   insulin aspart 100 UNIT/ML FlexPen Commonly known as: NOVOLOG Inject 0-6 Units into the skin 3 (three) times daily with meals. insulin aspart (novoLOG) injection 0-6 Units 0-6 Units Subcutaneous, 3 times daily with meals CBG < 70: Implement  Hypoglycemia Standing Orders and refer to Hypoglycemia Standing Orders sidebar report  CBG 70 - 120: 0 unit CBG 121 - 150: 0 unit  CBG 151 - 200: 0 unit CBG 201 - 250: 1 units CBG 251 - 300:  2units CBG 301 - 350:  3 units  CBG 351 - 400:  5 units  CBG > 400: 6 units   LORazepam 0.5 MG tablet Commonly known as: Ativan Take 1 tablet (0.5 mg total) by mouth every 12 (twelve) hours as needed for anxiety or sleep.   metFORMIN 500 MG tablet Commonly known as: GLUCOPHAGE Take 500 mg by mouth 2 (two) times daily with a meal.   pantoprazole 40 MG tablet Commonly known as: PROTONIX Take 1 tablet (40 mg total) by mouth 2 (two) times daily.   polyethylene glycol 17 g packet Commonly known as: MiraLax Take 17 g by mouth daily.   senna-docusate 8.6-50 MG tablet Commonly known as: Senokot-S Take 2 tablets by mouth 2 (two) times daily.      Allergies  Allergen Reactions  . Daypro [Oxaprozin] Shortness Of Breath and  Other (See Comments)    weakness  . Advil [Ibuprofen] Other (See Comments)    weakness    The results of significant diagnostics from this hospitalization (including imaging, microbiology, ancillary and laboratory) are listed below for reference.    Significant Diagnostic Studies: CT ABDOMEN PELVIS W CONTRAST  Result Date: 10/01/2020 CLINICAL DATA:  Mid abdominal pain, dementia, jaundice EXAM: CT ABDOMEN AND PELVIS WITH CONTRAST TECHNIQUE: Multidetector CT imaging of the abdomen and pelvis was performed using the standard protocol following bolus administration of intravenous contrast. CONTRAST:  57mL OMNIPAQUE IOHEXOL 300 MG/ML  SOLN COMPARISON:  06/22/2020 FINDINGS: Lower chest: No acute pleural or parenchymal lung disease. Hepatobiliary: Multiple calcified gallstones are identified. No evidence of gallbladder wall thickening or acute cholecystitis. There is mild intrahepatic biliary duct dilation. No focal liver abnormalities. Common bile duct measures up to 9 mm in diameter,  which is grossly normal for age. There is a 9 mm calculus within the downstream common bile duct best seen on image 24 series 3. Pancreas: Unremarkable. No pancreatic ductal dilatation or surrounding inflammatory changes. Spleen: Spleen is enlarged measuring 15.1 cm in craniocaudal length. No focal splenic abnormalities. Adrenals/Urinary Tract: There are bilateral renal cortical cysts, largest off the lower pole left kidney measuring up to 6.8 cm. Bilateral renal cortical thinning. No nephrolithiasis or obstructive uropathy. The bladder is unremarkable. The adrenals are normal. Stomach/Bowel: No bowel obstruction or ileus. Distal colonic diverticulosis without diverticulitis. No bowel wall thickening or inflammatory change. Vascular/Lymphatic: Aortic atherosclerosis. No enlarged abdominal or pelvic lymph nodes. Reproductive: Uterus is surgically absent. There is a large cyst within the left adnexa measuring 9.8 x 6.6 x 6.2 cm. No mural nodularity or internal septation. No right adnexal mass. Other: No free fluid or free gas.  No abdominal wall hernia. Musculoskeletal: There is a subacute left anterolateral sixth rib fracture. Chronic L1 compression fracture is again noted. Reconstructed images demonstrate no additional findings. IMPRESSION: 1. Mild intrahepatic biliary duct dilation, with evidence of a 9 mm calculus in the downstream common bile duct. ERCP may be useful. 2. Cholelithiasis without cholecystitis. 3. 9.8 cm left adnexal cyst. Recommend follow-up US in 6-12 months. Note: This recommendation does not apply to premenarchal patients and to those with increased risk (genetic, family history, elevated tumor markers or other high-risk factors) of ovarian cancer. Reference: JACR 2020 Feb; 17(2):248-254 4. Splenomegaly. Electronically Signed   By: Randa Ngo M.D.   On: 10/01/2020 17:14   DG ERCP BILIARY & PANCREATIC DUCTS  Result Date: 10/03/2020 CLINICAL DATA:  86 year old female with jaundice EXAM:  ERCP TECHNIQUE: Multiple spot images obtained with the fluoroscopic device and submitted for interpretation post-procedure. FLUOROSCOPY TIME:  Fluoroscopy Time:  2 minutes 38 seconds COMPARISON:  MR 10/02/2020 FINDINGS: Limited intraoperative fluoroscopic spot images during ERCP. Initial image demonstrates the endoscope projecting over the upper abdomen. Cannulation of the ampulla with safety wire in place and then retrograde infusion of contrast. Deployment of a retrieval balloon. IMPRESSION: Limited images during ERCP demonstrates partial opacification of the extrahepatic biliary ducts with deployment of a balloon retrieval catheter. Please refer to the dictated operative report for full details of intraoperative findings and procedure. Electronically Signed   By: Corrie Mckusick D.O.   On: 10/03/2020 14:08   MR ABDOMEN MRCP W WO CONTAST  Result Date: 10/02/2020 CLINICAL DATA:  Jaundice.  Biliary ductal dilatation on recent CT. EXAM: MRI ABDOMEN WITHOUT AND WITH CONTRAST (INCLUDING MRCP) TECHNIQUE: Multiplanar multisequence MR imaging of the abdomen was performed both before  and after the administration of intravenous contrast. Heavily T2-weighted images of the biliary and pancreatic ducts were obtained, and three-dimensional MRCP images were rendered by post processing. CONTRAST:  68mL GADAVIST GADOBUTROL 1 MMOL/ML IV SOLN COMPARISON:  CT on 10/01/2020 FINDINGS: Lower chest: No acute findings. Hepatobiliary: No hepatic masses identified. Multiple gallstones are seen measuring up to 1 cm, and layering gallbladder sludge is also noted. No evidence of acute cholecystitis. Diffuse biliary ductal dilatation is seen, with common bile duct measuring 17 mm in diameter. An 11 mm calculus is seen in the distal common bile duct adjacent to the ampulla on image 19/3. Pancreas:  No mass or inflammatory changes. Spleen:  Within normal limits in size and appearance. Adrenals/Urinary Tract: Multiple small bilateral renal  cortical and sinus cysts are seen. A 7 cm subcapsular cyst is seen projecting from the lower pole of the left kidney. No renal masses identified. No evidence of hydronephrosis. Stomach/Bowel: A few tiny duodenal diverticula are seen at the junction of the 2nd and 3rd portions. Otherwise unremarkable. Vascular/Lymphatic: No pathologically enlarged lymph nodes identified. No abdominal aortic aneurysm. Other:  None. Musculoskeletal:  No suspicious bone lesions identified. IMPRESSION: Cholelithiasis and gallbladder sludge. No evidence of acute cholecystitis. Diffuse biliary ductal dilatation, due to 11 mm calculus in the distal common bile duct adjacent to the ampulla. Electronically Signed   By: Marlaine Hind M.D.   On: 10/02/2020 16:10   ECHOCARDIOGRAM COMPLETE  Result Date: 10/03/2020    ECHOCARDIOGRAM REPORT   Patient Name:   Robin Guzman Date of Exam: 10/03/2020 Medical Rec #:  [id number]        Height:       64.0 in Accession #:    [id number]       Weight:       147.0 lb Date of Birth:  06-Dec-1927        BSA:          1.716 m Patient Age:    24 years         BP:           136/59 mmHg Patient Gender: F                HR:           65 bpm. Exam Location:  Inpatient Procedure: 2D Echo Indications:    cardiomyopathy 425.9  History:        Patient has prior history of Echocardiogram examinations, most                 recent 02/11/2016. CAD, Abnormal ECG; Risk Factors:Diabetes,                 Dyslipidemia and Hypertension.  Sonographer:    Johny Chess Referring Phys: Buford Dresser  Sonographer Comments: Image acquisition challenging due to patient behavioral factors. and Image acquisition challenging due to uncooperative patient. IMPRESSIONS  1. Left ventricular ejection fraction, by estimation, is 55 to 60%. The left ventricle has normal function. The left ventricle has no regional wall motion abnormalities. There is mild left ventricular hypertrophy. Left ventricular diastolic parameters are  consistent with Grade I diastolic dysfunction (impaired relaxation).  2. Right ventricular systolic function is normal. The right ventricular size is normal. There is normal pulmonary artery systolic pressure. The estimated right ventricular systolic pressure is 53.2 mmHg.  3. Left atrial size was mildly dilated.  4. The mitral valve is normal in structure. Trivial mitral valve regurgitation. No evidence of mitral stenosis.  5. The aortic valve is tricuspid. Aortic valve regurgitation is mild.  6. The inferior vena cava is normal in size with greater than 50% respiratory variability, suggesting right atrial pressure of 3 mmHg. FINDINGS  Left Ventricle: Left ventricular ejection fraction, by estimation, is 55 to 60%. The left ventricle has normal function. The left ventricle has no regional wall motion abnormalities. The left ventricular internal cavity size was normal in size. There is  mild left ventricular hypertrophy. Left ventricular diastolic parameters are consistent with Grade I diastolic dysfunction (impaired relaxation). Right Ventricle: The right ventricular size is normal. No increase in right ventricular wall thickness. Right ventricular systolic function is normal. There is normal pulmonary artery systolic pressure. The tricuspid regurgitant velocity is 2.82 m/s, and  with an assumed right atrial pressure of 3 mmHg, the estimated right ventricular systolic pressure is 86.7 mmHg. Left Atrium: Left atrial size was mildly dilated. Right Atrium: Right atrial size was normal in size. Pericardium: Trivial pericardial effusion is present. Mitral Valve: The mitral valve is normal in structure. Mild mitral annular calcification. Trivial mitral valve regurgitation. No evidence of mitral valve stenosis. Tricuspid Valve: The tricuspid valve is normal in structure. Tricuspid valve regurgitation is trivial. Aortic Valve: The aortic valve is tricuspid. Aortic valve regurgitation is mild. Aortic regurgitation PHT  measures 433 msec. Pulmonic Valve: The pulmonic valve was normal in structure. Pulmonic valve regurgitation is not visualized. Aorta: The aortic root is normal in size and structure. Venous: The inferior vena cava is normal in size with greater than 50% respiratory variability, suggesting right atrial pressure of 3 mmHg. IAS/Shunts: No atrial level shunt detected by color flow Doppler.  LEFT VENTRICLE PLAX 2D LVIDd:         4.30 cm Diastology LVIDs:         2.70 cm LV e' medial:    4.68 cm/s LV PW:         1.00 cm LV E/e' medial:  18.5 LV IVS:        1.10 cm LV e' lateral:   6.09 cm/s                        LV E/e' lateral: 14.2  RIGHT VENTRICLE             IVC RV S prime:     13.10 cm/s  IVC diam: 1.40 cm LEFT ATRIUM             Index       RIGHT ATRIUM           Index LA diam:        3.00 cm 1.75 cm/m  RA Area:     11.00 cm LA Vol (A2C):   54.6 ml 31.81 ml/m RA Volume:   23.00 ml  13.40 ml/m LA Vol (A4C):   46.1 ml 26.86 ml/m LA Biplane Vol: 52.3 ml 30.47 ml/m  AORTIC VALVE LVOT Vmax:   124.00 cm/s LVOT Vmean:  83.300 cm/s LVOT VTI:    0.319 m AI PHT:      433 msec  AORTA Ao Root diam: 3.40 cm Ao Asc diam:  3.50 cm MITRAL VALVE                TRICUSPID VALVE MV Area (PHT): 2.24 cm     TR Peak grad:   31.8 mmHg MV Decel Time: 338 msec     TR Vmax:        282.00 cm/s  MV E velocity: 86.60 cm/s MV A velocity: 128.00 cm/s  SHUNTS MV E/A ratio:  0.68         Systemic VTI: 0.32 m Loralie Champagne MD Electronically signed by Loralie Champagne MD Signature Date/Time: 10/03/2020/12:26:17 PM    Final     Microbiology: Recent Results (from the past 240 hour(s))  Respiratory Panel by RT PCR (Flu A&B, Covid) - Nasopharyngeal Swab     Status: None   Collection Time: 10/01/20  5:30 PM   Specimen: Nasopharyngeal Swab  Result Value Ref Range Status   SARS Coronavirus 2 by RT PCR NEGATIVE NEGATIVE Final    Comment: (NOTE) SARS-CoV-2 target nucleic acids are NOT DETECTED.  The SARS-CoV-2 RNA is generally detectable in  upper respiratoy specimens during the acute phase of infection. The lowest concentration of SARS-CoV-2 viral copies this assay can detect is 131 copies/mL. A negative result does not preclude SARS-Cov-2 infection and should not be used as the sole basis for treatment or other patient management decisions. A negative result may occur with  improper specimen collection/handling, submission of specimen other than nasopharyngeal swab, presence of viral mutation(s) within the areas targeted by this assay, and inadequate number of viral copies (<131 copies/mL). A negative result must be combined with clinical observations, patient history, and epidemiological information. The expected result is Negative.  Fact Sheet for Patients:  PinkCheek.be  Fact Sheet for Healthcare Providers:  GravelBags.it  This test is no t yet approved or cleared by the [place] FDA and  has been authorized for detection and/or diagnosis of SARS-CoV-2 by FDA under an Emergency Use Authorization (EUA). This EUA will remain  in effect (meaning this test can be used) for the duration of the COVID-19 declaration under Section 564(b)(1) of the Act, 21 U.S.C. section 360bbb-3(b)(1), unless the authorization is terminated or revoked sooner.     Influenza A by PCR NEGATIVE NEGATIVE Final   Influenza B by PCR NEGATIVE NEGATIVE Final    Comment: (NOTE) The Xpert Xpress SARS-CoV-2/FLU/RSV assay is intended as an aid in  the diagnosis of influenza from Nasopharyngeal swab specimens and  should not be used as a sole basis for treatment. Nasal washings and  aspirates are unacceptable for Xpert Xpress SARS-CoV-2/FLU/RSV  testing.  Fact Sheet for Patients: PinkCheek.be  Fact Sheet for Healthcare Providers: GravelBags.it  This test is not yet approved or cleared by the [place] FDA and  has been  authorized for detection and/or diagnosis of SARS-CoV-2 by  FDA under an Emergency Use Authorization (EUA). This EUA will remain  in effect (meaning this test can be used) for the duration of the  Covid-19 declaration under Section 564(b)(1) of the Act, 21  U.S.C. section 360bbb-3(b)(1), unless the authorization is  terminated or revoked. Performed at [facility], [address], [place], [place] [address]      Labs: Basic Metabolic Panel: Recent Labs  Lab 10/02/20 0958 10/03/20 0604 10/04/20 0054 10/05/20 0306 10/06/20 0323  NA 135 139 139 140 140  K 4.5 4.4 4.7 3.6 3.5  CL 105 110 110 111 112*  CO2 22 22 20* 22 20*  GLUCOSE 159* 143* 154* 171* 117*  BUN 13 11 15 18 10   CREATININE 1.05* 1.07* 1.26* 1.38* 1.11*  CALCIUM 9.0 8.8* 8.7* 8.3* 8.0*   Liver Function Tests: Recent Labs  Lab 10/02/20 0958 10/03/20 0604 10/04/20 0054 10/05/20 0306 10/06/20 0323  AST 62* 72* 54* 33 30  ALT 56* 52* 48* 37 29  ALKPHOS 721*  670* 600* 460* 364*  BILITOT 7.4* 7.5* 6.0* 3.6* 3.0*  PROT 5.6* 5.1* 5.0* 5.0* 4.4*  ALBUMIN 2.2* 1.9* 1.9* 1.9* 1.8*   Recent Labs  Lab 10/01/20 1415  LIPASE 27   No results for input(s): AMMONIA in the last 168 hours. CBC: Recent Labs  Lab 10/02/20 0958 10/02/20 0958 10/03/20 0604 10/04/20 0054 10/05/20 0306 10/05/20 1913 10/06/20 0323  WBC 6.6  --  5.8 5.3 6.4  --  5.0  NEUTROABS 5.6  --  4.8 4.7 4.9  --  3.5  HGB 8.2*   < > 7.4* 7.7* 7.3* 8.9* 8.1*  HCT 25.4*   < > 23.4* 24.1* 22.9* 28.1* 25.5*  MCV 102.0*  --  104.0* 103.9* 104.6*  --  98.5  PLT 185  --  148* 169 156  --  137*   < > = values in this interval not displayed.   Cardiac Enzymes: Recent Labs  Lab 10/01/20 1415  CKTOTAL 8*   BNP: BNP (last 3 results) No results for input(s): BNP in the last 8760 hours.  ProBNP (last 3 results) No results for input(s): PROBNP in the last 8760 hours.  CBG: Recent Labs  Lab 10/05/20 1955 10/06/20 0021 10/06/20 0353  10/06/20 0808 10/06/20 1211  GLUCAP 131* 167* 161* 168* 141*    Active Problems:   Chronic diastolic heart failure (HCC)   Essential hypertension   Controlled type 2 diabetes mellitus without complication, without long-term current use of insulin (HCC)   Dyslipidemia   Atherosclerosis of aorta (HCC)   Back pain   Choledocholithiasis   Time coordinating discharge: 38 minutes  Signed:        Chynna Buerkle, DO [facility]  10/06/2020, 12:27 PM

## 2020-10-07 DIAGNOSIS — E782 Mixed hyperlipidemia: Secondary | ICD-10-CM | POA: Diagnosis not present

## 2020-10-07 DIAGNOSIS — E1122 Type 2 diabetes mellitus with diabetic chronic kidney disease: Secondary | ICD-10-CM | POA: Diagnosis not present

## 2020-10-07 DIAGNOSIS — I110 Hypertensive heart disease with heart failure: Secondary | ICD-10-CM | POA: Diagnosis not present

## 2020-10-07 DIAGNOSIS — R2681 Unsteadiness on feet: Secondary | ICD-10-CM | POA: Diagnosis not present

## 2020-10-07 DIAGNOSIS — I130 Hypertensive heart and chronic kidney disease with heart failure and stage 1 through stage 4 chronic kidney disease, or unspecified chronic kidney disease: Secondary | ICD-10-CM | POA: Diagnosis not present

## 2020-10-07 DIAGNOSIS — D631 Anemia in chronic kidney disease: Secondary | ICD-10-CM | POA: Diagnosis not present

## 2020-10-07 DIAGNOSIS — E1165 Type 2 diabetes mellitus with hyperglycemia: Secondary | ICD-10-CM | POA: Diagnosis not present

## 2020-10-07 DIAGNOSIS — M6281 Muscle weakness (generalized): Secondary | ICD-10-CM | POA: Diagnosis not present

## 2020-10-07 DIAGNOSIS — L989 Disorder of the skin and subcutaneous tissue, unspecified: Secondary | ICD-10-CM | POA: Diagnosis not present

## 2020-10-07 DIAGNOSIS — I5032 Chronic diastolic (congestive) heart failure: Secondary | ICD-10-CM | POA: Diagnosis not present

## 2020-10-07 DIAGNOSIS — M170 Bilateral primary osteoarthritis of knee: Secondary | ICD-10-CM | POA: Diagnosis not present

## 2020-10-07 DIAGNOSIS — E875 Hyperkalemia: Secondary | ICD-10-CM | POA: Diagnosis not present

## 2020-10-07 DIAGNOSIS — I428 Other cardiomyopathies: Secondary | ICD-10-CM | POA: Diagnosis not present

## 2020-10-07 DIAGNOSIS — D696 Thrombocytopenia, unspecified: Secondary | ICD-10-CM | POA: Diagnosis not present

## 2020-10-07 DIAGNOSIS — S32010D Wedge compression fracture of first lumbar vertebra, subsequent encounter for fracture with routine healing: Secondary | ICD-10-CM | POA: Diagnosis not present

## 2020-10-07 DIAGNOSIS — D509 Iron deficiency anemia, unspecified: Secondary | ICD-10-CM | POA: Diagnosis not present

## 2020-10-07 DIAGNOSIS — Z7984 Long term (current) use of oral hypoglycemic drugs: Secondary | ICD-10-CM | POA: Diagnosis not present

## 2020-10-07 DIAGNOSIS — I2510 Atherosclerotic heart disease of native coronary artery without angina pectoris: Secondary | ICD-10-CM | POA: Diagnosis not present

## 2020-10-08 DIAGNOSIS — I509 Heart failure, unspecified: Secondary | ICD-10-CM | POA: Diagnosis not present

## 2020-10-08 DIAGNOSIS — R531 Weakness: Secondary | ICD-10-CM | POA: Diagnosis not present

## 2020-10-08 DIAGNOSIS — S32019A Unspecified fracture of first lumbar vertebra, initial encounter for closed fracture: Secondary | ICD-10-CM | POA: Diagnosis not present

## 2020-10-09 DIAGNOSIS — E1165 Type 2 diabetes mellitus with hyperglycemia: Secondary | ICD-10-CM | POA: Diagnosis not present

## 2020-10-09 DIAGNOSIS — I5032 Chronic diastolic (congestive) heart failure: Secondary | ICD-10-CM | POA: Diagnosis not present

## 2020-10-09 DIAGNOSIS — R2681 Unsteadiness on feet: Secondary | ICD-10-CM | POA: Diagnosis not present

## 2020-10-09 DIAGNOSIS — I110 Hypertensive heart disease with heart failure: Secondary | ICD-10-CM | POA: Diagnosis not present

## 2020-10-09 DIAGNOSIS — I2510 Atherosclerotic heart disease of native coronary artery without angina pectoris: Secondary | ICD-10-CM | POA: Diagnosis not present

## 2020-10-09 DIAGNOSIS — M6281 Muscle weakness (generalized): Secondary | ICD-10-CM | POA: Diagnosis not present

## 2020-10-09 DIAGNOSIS — S32010D Wedge compression fracture of first lumbar vertebra, subsequent encounter for fracture with routine healing: Secondary | ICD-10-CM | POA: Diagnosis not present

## 2020-10-09 DIAGNOSIS — Z7984 Long term (current) use of oral hypoglycemic drugs: Secondary | ICD-10-CM | POA: Diagnosis not present

## 2020-10-10 ENCOUNTER — Emergency Department (HOSPITAL_COMMUNITY): Payer: Medicare Other

## 2020-10-10 ENCOUNTER — Encounter (HOSPITAL_COMMUNITY): Payer: Self-pay

## 2020-10-10 ENCOUNTER — Emergency Department (HOSPITAL_COMMUNITY)
Admission: EM | Admit: 2020-10-10 | Discharge: 2020-10-10 | Disposition: A | Discharge status: Skilled Nursing Facility | Payer: Medicare Other | Source: Home / Self Care | Attending: Emergency Medicine | Admitting: Emergency Medicine

## 2020-10-10 ENCOUNTER — Other Ambulatory Visit: Payer: Self-pay

## 2020-10-10 DIAGNOSIS — I5032 Chronic diastolic (congestive) heart failure: Secondary | ICD-10-CM | POA: Diagnosis not present

## 2020-10-10 DIAGNOSIS — R233 Spontaneous ecchymoses: Secondary | ICD-10-CM | POA: Insufficient documentation

## 2020-10-10 DIAGNOSIS — G9389 Other specified disorders of brain: Secondary | ICD-10-CM | POA: Diagnosis not present

## 2020-10-10 DIAGNOSIS — I110 Hypertensive heart disease with heart failure: Secondary | ICD-10-CM | POA: Insufficient documentation

## 2020-10-10 DIAGNOSIS — R6889 Other general symptoms and signs: Secondary | ICD-10-CM | POA: Diagnosis not present

## 2020-10-10 DIAGNOSIS — Z743 Need for continuous supervision: Secondary | ICD-10-CM | POA: Diagnosis not present

## 2020-10-10 DIAGNOSIS — I10 Essential (primary) hypertension: Secondary | ICD-10-CM | POA: Diagnosis not present

## 2020-10-10 DIAGNOSIS — E1165 Type 2 diabetes mellitus with hyperglycemia: Secondary | ICD-10-CM | POA: Insufficient documentation

## 2020-10-10 DIAGNOSIS — F0390 Unspecified dementia without behavioral disturbance: Secondary | ICD-10-CM | POA: Insufficient documentation

## 2020-10-10 DIAGNOSIS — M80052A Age-related osteoporosis with current pathological fracture, left femur, initial encounter for fracture: Secondary | ICD-10-CM | POA: Diagnosis not present

## 2020-10-10 DIAGNOSIS — Z7982 Long term (current) use of aspirin: Secondary | ICD-10-CM | POA: Insufficient documentation

## 2020-10-10 DIAGNOSIS — I5021 Acute systolic (congestive) heart failure: Secondary | ICD-10-CM | POA: Insufficient documentation

## 2020-10-10 DIAGNOSIS — Z85828 Personal history of other malignant neoplasm of skin: Secondary | ICD-10-CM | POA: Insufficient documentation

## 2020-10-10 DIAGNOSIS — R58 Hemorrhage, not elsewhere classified: Secondary | ICD-10-CM | POA: Diagnosis not present

## 2020-10-10 DIAGNOSIS — S4992XA Unspecified injury of left shoulder and upper arm, initial encounter: Secondary | ICD-10-CM | POA: Insufficient documentation

## 2020-10-10 DIAGNOSIS — S0990XA Unspecified injury of head, initial encounter: Secondary | ICD-10-CM | POA: Diagnosis not present

## 2020-10-10 DIAGNOSIS — I2510 Atherosclerotic heart disease of native coronary artery without angina pectoris: Secondary | ICD-10-CM | POA: Diagnosis not present

## 2020-10-10 DIAGNOSIS — J320 Chronic maxillary sinusitis: Secondary | ICD-10-CM | POA: Diagnosis not present

## 2020-10-10 DIAGNOSIS — J341 Cyst and mucocele of nose and nasal sinus: Secondary | ICD-10-CM | POA: Diagnosis not present

## 2020-10-10 DIAGNOSIS — Z794 Long term (current) use of insulin: Secondary | ICD-10-CM | POA: Insufficient documentation

## 2020-10-10 DIAGNOSIS — S32010D Wedge compression fracture of first lumbar vertebra, subsequent encounter for fracture with routine healing: Secondary | ICD-10-CM | POA: Diagnosis not present

## 2020-10-10 DIAGNOSIS — M6281 Muscle weakness (generalized): Secondary | ICD-10-CM | POA: Diagnosis not present

## 2020-10-10 DIAGNOSIS — R2681 Unsteadiness on feet: Secondary | ICD-10-CM | POA: Diagnosis not present

## 2020-10-10 DIAGNOSIS — R29898 Other symptoms and signs involving the musculoskeletal system: Secondary | ICD-10-CM | POA: Diagnosis not present

## 2020-10-10 DIAGNOSIS — Z7984 Long term (current) use of oral hypoglycemic drugs: Secondary | ICD-10-CM | POA: Insufficient documentation

## 2020-10-10 DIAGNOSIS — W19XXXA Unspecified fall, initial encounter: Secondary | ICD-10-CM | POA: Insufficient documentation

## 2020-10-10 DIAGNOSIS — S72002A Fracture of unspecified part of neck of left femur, initial encounter for closed fracture: Secondary | ICD-10-CM | POA: Diagnosis not present

## 2020-10-10 DIAGNOSIS — M7989 Other specified soft tissue disorders: Secondary | ICD-10-CM | POA: Diagnosis not present

## 2020-10-10 MED ORDER — HALOPERIDOL LACTATE 5 MG/ML IJ SOLN
5.0000 mg | Freq: Once | INTRAMUSCULAR | Status: AC
  Administered 2020-10-10: 5 mg via INTRAMUSCULAR
  Filled 2020-10-10: qty 1

## 2020-10-10 MED ORDER — LORAZEPAM 0.5 MG PO TABS
0.5000 mg | ORAL_TABLET | Freq: Once | ORAL | Status: AC
  Administered 2020-10-10: 0.5 mg via ORAL
  Filled 2020-10-10: qty 1

## 2020-10-10 NOTE — ED Triage Notes (Signed)
Pt brought in by EMS from [facility] for a fall. Pt has swelling and large hematoma to left arm. Pt says the hematoma/bruising was already present from a different fall about a week ago.

## 2020-10-10 NOTE — ED Notes (Signed)
Pt confused and agitated, pulling cords off, hitting herself, yelling and trying to get out of bed. MD aware.

## 2020-10-11 NOTE — ED Provider Notes (Signed)
[facility] Provider Note   CSN: [id number] Arrival date & time: 10/10/20  0541     History Chief Complaint  Patient presents with  . Fall    left arm injury    Robin Guzman is a 86 y.o. female.  Fall with questionable head contact. Definitely with left arm ecchymosis but appears old. Patient without other complaints.   Dementia. Level v caveat applies   Fall       Past Medical History:  Diagnosis Date  . Arthritis   . CAD (coronary artery disease), native coronary artery    07/27/17 nonobstructive, EF 25-35% by LV gram  . CHF (congestive heart failure) ([place])   . Dementia ([place])   . Diabetes mellitus without complication ([place])   . Dyslipidemia   . Dyspnea   . Dyspnea on exertion   . Gallstone 09/2020  . History of stress test 07/05/2006   High risk scan cardiac cathe was recommended.  [name] Hx of echocardiogram 04/09/2011   EF 55% Mildly hypertrophic left ventricle with normal systolic function, Moderate (grade II) diastolic dysfunction. Elevated left atrial pressure. No significant valvular abnormalities. No pericardial effusion. Mild to moderate pulmonary arterial hypertension.  . Hypercholesteremia   . Hypertension   . Myocardial infarct ([place])   . Skin cancer    HISTORY OF     Patient Active Problem List   Diagnosis Date Noted  . Choledocholithiasis 10/01/2020  . Compression fracture of L1 vertebra (HCC) 06/23/2020  . Leukocytosis 06/23/2020  . Macrocytic anemia 06/23/2020  . Hyperglycemia due to diabetes mellitus ([place]) 06/23/2020  . Thrombocytopenia ([place]) 06/23/2020  . Back pain 06/23/2020  . Fall at home, initial encounter 06/23/2020  . SVT (supraventricular tachycardia) ([place]) 10/23/2018  . PAT (paroxysmal atrial tachycardia) ([place]) 04/23/2018  . LBBB (left bundle branch block) 04/23/2018  . Dyslipidemia 04/23/2018  . Atherosclerosis of aorta ([place]) 04/23/2018  . Acute systolic heart failure ([place]) 07/28/2017  . Sundowning  07/28/2017  . NSTEMI (non-ST elevated myocardial infarction) ([place]) 07/26/2017  . Closed 3-part fracture of proximal humerus, left, with routine healing, subsequent encounter 12/02/2016  . UTI (urinary tract infection) 12/25/2013  . Dyspnea 12/25/2013  . Near syncope 12/25/2013  . CAD (coronary artery disease) 12/25/2013  . Chronic diastolic heart failure ([place]) 08/07/2013  . Essential hypertension 08/07/2013  . Controlled type 2 diabetes mellitus without complication, without long-term current use of insulin ([place]) 08/07/2013  . Hypercholesteremia 08/07/2013    Past Surgical History:  Procedure Laterality Date  . ABDOMINAL HYSTERECTOMY    . CATARACT EXTRACTION W/PHACO Right 05/20/2017   Procedure: CATARACT EXTRACTION PHACO AND INTRAOCULAR LENS PLACEMENT RIGHT EYE;  Surgeon: Tonny Branch, MD;  Location: [facility];  Service: Ophthalmology;  Laterality: Right;  CDE: 17.77  . CATARACT EXTRACTION W/PHACO Left 06/21/2017   Procedure: CATARACT EXTRACTION PHACO AND INTRAOCULAR LENS PLACEMENT (IOC);  Surgeon: Tonny Branch, MD;  Location: [facility];  Service: Ophthalmology;  Laterality: Left;  CDE: 8.90  . ERCP N/A 10/03/2020   Procedure: ENDOSCOPIC RETROGRADE CHOLANGIOPANCREATOGRAPHY (ERCP);  Surgeon: Arta Silence, MD;  Location: [facility];  Service: Endoscopy;  Laterality: N/A;  . LEFT HEART CATH AND CORONARY ANGIOGRAPHY N/A 07/27/2017   Procedure: LEFT HEART CATH AND CORONARY ANGIOGRAPHY;  Surgeon: Wellington Hampshire, MD;  Location: [facility];  Service: Cardiovascular;  Laterality: N/A;  . REMOVAL OF STONES  10/03/2020   Procedure: REMOVAL OF STONES;  Surgeon: Arta Silence, MD;  Location: [facility];  Service: Endoscopy;;  . Joan Mayans  10/03/2020   Procedure: SPHINCTEROTOMY;  Surgeon: Arta Silence, MD;  Location: [facility] ENDOSCOPY;  Service: Endoscopy;;     OB History    Gravida  3   Para  3   Term  3   Preterm      AB      Living        SAB      TAB      Ectopic        Multiple      Live Births              Family History  Problem Relation Age of Onset  . Cancer Mother   . Heart Problems Father   . Cancer Brother        brain  . Cancer Sister        brain    Social History   Tobacco Use  . Smoking status: Never Smoker  . Smokeless tobacco: Never Used  Vaping Use  . Vaping Use: Never used  Substance Use Topics  . Alcohol use: No  . Drug use: No    Home Medications Prior to Admission medications   Medication Sig Start Date End Date Taking? Authorizing Provider  acetaminophen (TYLENOL) 500 MG tablet Take 1,000 mg by mouth in the morning and at bedtime.    [provider]  aspirin EC 81 MG tablet Take 1 tablet (81 mg total) by mouth daily with breakfast. 06/25/20   Emokpae, Courage, MD  atorvastatin (LIPITOR) 10 MG tablet Take 10 mg by mouth daily.    [provider]  carvedilol (COREG) 25 MG tablet Take 1 tablet (25 mg total) by mouth 2 (two) times daily with a meal. 10/23/18   Croitoru, Mihai, MD  ferrous sulfate 325 (65 FE) MG tablet Take 325 mg by mouth daily.    [provider]  HYDROcodone-acetaminophen (NORCO/VICODIN) 5-325 MG tablet Take 0.5 tablets by mouth every 4 (four) hours as needed for moderate pain or severe pain. 06/25/20   Emokpae, Courage, MD  insulin aspart (NOVOLOG) 100 UNIT/ML FlexPen Inject 0-6 Units into the skin 3 (three) times daily with meals. insulin aspart (novoLOG) injection 0-6 Units 0-6 Units Subcutaneous, 3 times daily with meals CBG < 70: Implement Hypoglycemia Standing Orders and refer to Hypoglycemia Standing Orders sidebar report  CBG 70 - 120: 0 unit CBG 121 - 150: 0 unit  CBG 151 - 200: 0 unit CBG 201 - 250: 1 units CBG 251 - 300:  2units CBG 301 - 350:  3 units  CBG 351 - 400:  5 units  CBG > 400: 6 units Patient not taking: Reported on 10/01/2020 06/25/20   Roxan Hockey, MD  LORazepam (ATIVAN) 0.5 MG tablet Take 1 tablet (0.5 mg total) by mouth every 12 (twelve) hours as  needed for anxiety or sleep. 06/25/20 06/25/21  Roxan Hockey, MD  metFORMIN (GLUCOPHAGE) 500 MG tablet Take 500 mg by mouth 2 (two) times daily with a meal.    [provider]  Multiple Vitamins-Minerals (CENTRUM ADULTS) TABS Take 1 tablet by mouth daily.    [provider]  pantoprazole (PROTONIX) 40 MG tablet Take 1 tablet (40 mg total) by mouth 2 (two) times daily. 10/06/20   Swayze, Ava, DO  polyethylene glycol (MIRALAX) 17 g packet Take 17 g by mouth daily. 06/25/20   Emokpae, Courage, MD  senna-docusate (SENOKOT-S) 8.6-50 MG tablet Take 2 tablets by mouth 2 (two) times daily. 06/25/20   Roxan Hockey, MD  Allergies    Daypro [oxaprozin] and Advil [ibuprofen]  Review of Systems   Review of Systems  Unable to perform ROS: Dementia    Physical Exam Updated Vital Signs BP (!) 164/81 (BP Location: Right Arm)   Pulse 74   Temp (!) 97.5 F (36.4 C) (Oral)   Resp 19   SpO2 99%   Physical Exam Vitals and nursing note reviewed.  Constitutional:      Appearance: She is well-developed.  HENT:     Head: Normocephalic and atraumatic.     Mouth/Throat:     Mouth: Mucous membranes are moist.  Eyes:     Pupils: Pupils are equal, round, and reactive to light.  Cardiovascular:     Rate and Rhythm: Normal rate and regular rhythm.  Pulmonary:     Effort: No respiratory distress.     Breath sounds: No stridor.  Abdominal:     General: There is no distension.  Musculoskeletal:     Cervical back: Normal range of motion.     Comments: Ecchymosis and mild ttp to left upper arm  Skin:    General: Skin is warm and dry.     Findings: Bruising present.  Neurological:     General: No focal deficit present.     Mental Status: She is alert.     ED Results / Procedures / Treatments   Labs (all labs ordered are listed, but only abnormal results are displayed) Labs Reviewed - No data to display  EKG None  Radiology CT Head Wo Contrast  Result Date:  10/10/2020 CLINICAL DATA:  Fall, head injury, EXAM: CT HEAD WITHOUT CONTRAST TECHNIQUE: Contiguous axial images were obtained from the base of the skull through the vertex without intravenous contrast. COMPARISON:  None. FINDINGS: Brain: Normal anatomic configuration. Parenchymal volume loss is commensurate with the patient's age. Mild periventricular white matter changes are present likely reflecting the sequela of small vessel ischemia. No abnormal intra or extra-axial mass lesion or fluid collection. No abnormal mass effect or midline shift. No evidence of acute intracranial hemorrhage or infarct. Ventricular size is normal. Cerebellum unremarkable. Vascular: No asymmetric hyperdense vasculature at the skull base. Skull: Intact Sinuses/Orbits: Large mucous retention cyst noted within the right maxillary sinus. Remaining visualized paranasal sinuses are clear. Orbits are unremarkable. Other: Mastoid air cells and middle ear cavities are clear. IMPRESSION: No acute intracranial injury.  No calvarial fracture. Right maxillary sinus disease. Electronically Signed   By: Fidela Salisbury MD   On: 10/10/2020 06:35   DG Humerus Left  Result Date: 10/10/2020 CLINICAL DATA:  Fall, left arm injury EXAM: LEFT HUMERUS - 2+ VIEW COMPARISON:  None. FINDINGS: Two view radiograph left humerus demonstrates a remote, healed fracture of the left humeral head with minimal residual deformity. No acute fracture or dislocation. There is soft tissue swelling involving the lateral aspect of the mid left upper extremity at the level of the mid diaphysis of the humerus. IMPRESSION: Soft tissue swelling.  No fracture or dislocation. Electronically Signed   By: Fidela Salisbury MD   On: 10/10/2020 06:38    Procedures Procedures (including critical care time)  Medications Ordered in ED Medications  LORazepam (ATIVAN) tablet 0.5 mg (0.5 mg Oral Given 10/10/20 0802)  haloperidol lactate (HALDOL) injection 5 mg (5 mg Intramuscular  Given 10/10/20 0827)    ED Course  I have reviewed the triage vital signs and the nursing notes.  Pertinent labs & imaging results that were available during my care of the  patient were reviewed by me and considered in my medical decision making (see chart for details).    MDM Rules/Calculators/A&P                          No obvious new injuries. Stable for discharge back to facility.   Final Clinical Impression(s) / ED Diagnoses Final diagnoses:  Fall, initial encounter  Ecchymosis    Rx / DC Orders ED Discharge Orders    None       Kamorah Nevils, Corene Cornea, MD 10/11/20 [phone]

## 2020-10-13 ENCOUNTER — Encounter (HOSPITAL_COMMUNITY): Payer: Self-pay

## 2020-10-13 ENCOUNTER — Inpatient Hospital Stay (HOSPITAL_COMMUNITY): Payer: Medicare Other

## 2020-10-13 ENCOUNTER — Inpatient Hospital Stay (HOSPITAL_COMMUNITY)
Admission: EM | Admit: 2020-10-13 | Discharge: 2020-10-22 | DRG: 481 | Disposition: A | Discharge status: Skilled Nursing Facility | Payer: Medicare Other | Attending: Internal Medicine | Admitting: Internal Medicine

## 2020-10-13 ENCOUNTER — Emergency Department (HOSPITAL_COMMUNITY): Payer: Medicare Other

## 2020-10-13 DIAGNOSIS — W1830XA Fall on same level, unspecified, initial encounter: Secondary | ICD-10-CM | POA: Diagnosis present

## 2020-10-13 DIAGNOSIS — S40022A Contusion of left upper arm, initial encounter: Secondary | ICD-10-CM | POA: Diagnosis not present

## 2020-10-13 DIAGNOSIS — S72002A Fracture of unspecified part of neck of left femur, initial encounter for closed fracture: Secondary | ICD-10-CM | POA: Diagnosis not present

## 2020-10-13 DIAGNOSIS — D509 Iron deficiency anemia, unspecified: Secondary | ICD-10-CM | POA: Diagnosis not present

## 2020-10-13 DIAGNOSIS — Y92129 Unspecified place in nursing home as the place of occurrence of the external cause: Secondary | ICD-10-CM | POA: Diagnosis not present

## 2020-10-13 DIAGNOSIS — E785 Hyperlipidemia, unspecified: Secondary | ICD-10-CM | POA: Diagnosis present

## 2020-10-13 DIAGNOSIS — I739 Peripheral vascular disease, unspecified: Secondary | ICD-10-CM | POA: Diagnosis not present

## 2020-10-13 DIAGNOSIS — I110 Hypertensive heart disease with heart failure: Secondary | ICD-10-CM | POA: Diagnosis not present

## 2020-10-13 DIAGNOSIS — Z808 Family history of malignant neoplasm of other organs or systems: Secondary | ICD-10-CM

## 2020-10-13 DIAGNOSIS — E440 Moderate protein-calorie malnutrition: Secondary | ICD-10-CM | POA: Diagnosis not present

## 2020-10-13 DIAGNOSIS — Z886 Allergy status to analgesic agent status: Secondary | ICD-10-CM

## 2020-10-13 DIAGNOSIS — I471 Supraventricular tachycardia: Secondary | ICD-10-CM | POA: Diagnosis present

## 2020-10-13 DIAGNOSIS — S72002D Fracture of unspecified part of neck of left femur, subsequent encounter for closed fracture with routine healing: Secondary | ICD-10-CM | POA: Diagnosis not present

## 2020-10-13 DIAGNOSIS — M1990 Unspecified osteoarthritis, unspecified site: Secondary | ICD-10-CM | POA: Diagnosis present

## 2020-10-13 DIAGNOSIS — Z515 Encounter for palliative care: Secondary | ICD-10-CM

## 2020-10-13 DIAGNOSIS — I10 Essential (primary) hypertension: Secondary | ICD-10-CM | POA: Diagnosis present

## 2020-10-13 DIAGNOSIS — D62 Acute posthemorrhagic anemia: Secondary | ICD-10-CM | POA: Diagnosis not present

## 2020-10-13 DIAGNOSIS — E1165 Type 2 diabetes mellitus with hyperglycemia: Secondary | ICD-10-CM | POA: Diagnosis not present

## 2020-10-13 DIAGNOSIS — E1142 Type 2 diabetes mellitus with diabetic polyneuropathy: Secondary | ICD-10-CM | POA: Diagnosis not present

## 2020-10-13 DIAGNOSIS — Z4789 Encounter for other orthopedic aftercare: Secondary | ICD-10-CM | POA: Diagnosis not present

## 2020-10-13 DIAGNOSIS — Z01818 Encounter for other preprocedural examination: Secondary | ICD-10-CM | POA: Diagnosis not present

## 2020-10-13 DIAGNOSIS — M2550 Pain in unspecified joint: Secondary | ICD-10-CM | POA: Diagnosis not present

## 2020-10-13 DIAGNOSIS — I252 Old myocardial infarction: Secondary | ICD-10-CM

## 2020-10-13 DIAGNOSIS — Z6824 Body mass index (BMI) 24.0-24.9, adult: Secondary | ICD-10-CM

## 2020-10-13 DIAGNOSIS — Z7189 Other specified counseling: Secondary | ICD-10-CM | POA: Diagnosis not present

## 2020-10-13 DIAGNOSIS — K8050 Calculus of bile duct without cholangitis or cholecystitis without obstruction: Secondary | ICD-10-CM | POA: Diagnosis present

## 2020-10-13 DIAGNOSIS — Z8249 Family history of ischemic heart disease and other diseases of the circulatory system: Secondary | ICD-10-CM | POA: Diagnosis not present

## 2020-10-13 DIAGNOSIS — R262 Difficulty in walking, not elsewhere classified: Secondary | ICD-10-CM | POA: Diagnosis not present

## 2020-10-13 DIAGNOSIS — R404 Transient alteration of awareness: Secondary | ICD-10-CM | POA: Diagnosis not present

## 2020-10-13 DIAGNOSIS — I5032 Chronic diastolic (congestive) heart failure: Secondary | ICD-10-CM | POA: Diagnosis not present

## 2020-10-13 DIAGNOSIS — E119 Type 2 diabetes mellitus without complications: Secondary | ICD-10-CM | POA: Diagnosis present

## 2020-10-13 DIAGNOSIS — I2510 Atherosclerotic heart disease of native coronary artery without angina pectoris: Secondary | ICD-10-CM | POA: Diagnosis present

## 2020-10-13 DIAGNOSIS — D649 Anemia, unspecified: Secondary | ICD-10-CM | POA: Diagnosis not present

## 2020-10-13 DIAGNOSIS — Z743 Need for continuous supervision: Secondary | ICD-10-CM | POA: Diagnosis not present

## 2020-10-13 DIAGNOSIS — I2721 Secondary pulmonary arterial hypertension: Secondary | ICD-10-CM | POA: Diagnosis not present

## 2020-10-13 DIAGNOSIS — Z9889 Other specified postprocedural states: Secondary | ICD-10-CM | POA: Diagnosis not present

## 2020-10-13 DIAGNOSIS — J0130 Acute sphenoidal sinusitis, unspecified: Secondary | ICD-10-CM | POA: Diagnosis not present

## 2020-10-13 DIAGNOSIS — Z85828 Personal history of other malignant neoplasm of skin: Secondary | ICD-10-CM | POA: Diagnosis not present

## 2020-10-13 DIAGNOSIS — D539 Nutritional anemia, unspecified: Secondary | ICD-10-CM | POA: Diagnosis present

## 2020-10-13 DIAGNOSIS — Z66 Do not resuscitate: Secondary | ICD-10-CM | POA: Diagnosis present

## 2020-10-13 DIAGNOSIS — M6281 Muscle weakness (generalized): Secondary | ICD-10-CM | POA: Diagnosis not present

## 2020-10-13 DIAGNOSIS — Z20822 Contact with and (suspected) exposure to covid-19: Secondary | ICD-10-CM | POA: Diagnosis present

## 2020-10-13 DIAGNOSIS — J323 Chronic sphenoidal sinusitis: Secondary | ICD-10-CM | POA: Diagnosis not present

## 2020-10-13 DIAGNOSIS — Z888 Allergy status to other drugs, medicaments and biological substances status: Secondary | ICD-10-CM | POA: Diagnosis not present

## 2020-10-13 DIAGNOSIS — S72032A Displaced midcervical fracture of left femur, initial encounter for closed fracture: Secondary | ICD-10-CM | POA: Diagnosis not present

## 2020-10-13 DIAGNOSIS — F0391 Unspecified dementia with behavioral disturbance: Secondary | ICD-10-CM

## 2020-10-13 DIAGNOSIS — Z419 Encounter for procedure for purposes other than remedying health state, unspecified: Secondary | ICD-10-CM

## 2020-10-13 DIAGNOSIS — R1312 Dysphagia, oropharyngeal phase: Secondary | ICD-10-CM | POA: Diagnosis not present

## 2020-10-13 DIAGNOSIS — Z7984 Long term (current) use of oral hypoglycemic drugs: Secondary | ICD-10-CM | POA: Diagnosis not present

## 2020-10-13 DIAGNOSIS — I447 Left bundle-branch block, unspecified: Secondary | ICD-10-CM | POA: Diagnosis not present

## 2020-10-13 DIAGNOSIS — T148XXA Other injury of unspecified body region, initial encounter: Secondary | ICD-10-CM

## 2020-10-13 DIAGNOSIS — M80052A Age-related osteoporosis with current pathological fracture, left femur, initial encounter for fracture: Secondary | ICD-10-CM | POA: Diagnosis not present

## 2020-10-13 DIAGNOSIS — M150 Primary generalized (osteo)arthritis: Secondary | ICD-10-CM | POA: Diagnosis not present

## 2020-10-13 DIAGNOSIS — E7800 Pure hypercholesterolemia, unspecified: Secondary | ICD-10-CM | POA: Diagnosis present

## 2020-10-13 DIAGNOSIS — R17 Unspecified jaundice: Secondary | ICD-10-CM | POA: Diagnosis not present

## 2020-10-13 DIAGNOSIS — R6889 Other general symptoms and signs: Secondary | ICD-10-CM | POA: Diagnosis not present

## 2020-10-13 DIAGNOSIS — K219 Gastro-esophageal reflux disease without esophagitis: Secondary | ICD-10-CM | POA: Diagnosis not present

## 2020-10-13 DIAGNOSIS — R58 Hemorrhage, not elsewhere classified: Secondary | ICD-10-CM | POA: Diagnosis not present

## 2020-10-13 DIAGNOSIS — W19XXXA Unspecified fall, initial encounter: Secondary | ICD-10-CM

## 2020-10-13 DIAGNOSIS — R2681 Unsteadiness on feet: Secondary | ICD-10-CM | POA: Diagnosis not present

## 2020-10-13 DIAGNOSIS — I479 Paroxysmal tachycardia, unspecified: Secondary | ICD-10-CM | POA: Diagnosis not present

## 2020-10-13 DIAGNOSIS — S32010D Wedge compression fracture of first lumbar vertebra, subsequent encounter for fracture with routine healing: Secondary | ICD-10-CM | POA: Diagnosis not present

## 2020-10-13 DIAGNOSIS — R410 Disorientation, unspecified: Secondary | ICD-10-CM | POA: Diagnosis not present

## 2020-10-13 DIAGNOSIS — Z0389 Encounter for observation for other suspected diseases and conditions ruled out: Secondary | ICD-10-CM | POA: Diagnosis not present

## 2020-10-13 DIAGNOSIS — Z7401 Bed confinement status: Secondary | ICD-10-CM | POA: Diagnosis not present

## 2020-10-13 DIAGNOSIS — F03918 Unspecified dementia, unspecified severity, with other behavioral disturbance: Secondary | ICD-10-CM

## 2020-10-13 LAB — CBC
HCT: 32.5 % — ABNORMAL LOW (ref 36.0–46.0)
Hemoglobin: 9.8 g/dL — ABNORMAL LOW (ref 12.0–15.0)
MCH: 30.9 pg (ref 26.0–34.0)
MCHC: 30.2 g/dL (ref 30.0–36.0)
MCV: 102.5 fL — ABNORMAL HIGH (ref 80.0–100.0)
Platelets: 204 10*3/uL (ref 150–400)
RBC: 3.17 MIL/uL — ABNORMAL LOW (ref 3.87–5.11)
RDW: 23.4 % — ABNORMAL HIGH (ref 11.5–15.5)
WBC: 10.7 10*3/uL — ABNORMAL HIGH (ref 4.0–10.5)
nRBC: 0 % (ref 0.0–0.2)

## 2020-10-13 LAB — LIPASE, BLOOD: Lipase: 27 U/L (ref 11–51)

## 2020-10-13 LAB — BASIC METABOLIC PANEL
Anion gap: 8 (ref 5–15)
BUN: 13 mg/dL (ref 8–23)
CO2: 23 mmol/L (ref 22–32)
Calcium: 9 mg/dL (ref 8.9–10.3)
Chloride: 106 mmol/L (ref 98–111)
Creatinine, Ser: 0.96 mg/dL (ref 0.44–1.00)
GFR, Estimated: 56 mL/min — ABNORMAL LOW (ref >60)
Glucose, Bld: 160 mg/dL — ABNORMAL HIGH (ref 70–99)
Potassium: 4 mmol/L (ref 3.5–5.1)
Sodium: 137 mmol/L (ref 135–145)

## 2020-10-13 LAB — RESP PANEL BY RT-PCR (FLU A&B, COVID) ARPGX2
Influenza A by PCR: NEGATIVE
Influenza B by PCR: NEGATIVE
SARS Coronavirus 2 by RT PCR: NEGATIVE

## 2020-10-13 LAB — HEPATIC FUNCTION PANEL
ALT: 27 U/L (ref 0–44)
AST: 49 U/L — ABNORMAL HIGH (ref 15–41)
Albumin: 2.9 g/dL — ABNORMAL LOW (ref 3.5–5.0)
Alkaline Phosphatase: 303 U/L — ABNORMAL HIGH (ref 38–126)
Bilirubin, Direct: 1.6 mg/dL — ABNORMAL HIGH (ref 0.0–0.2)
Indirect Bilirubin: 2 mg/dL — ABNORMAL HIGH (ref 0.3–0.9)
Total Bilirubin: 3.6 mg/dL — ABNORMAL HIGH (ref 0.3–1.2)
Total Protein: 5.9 g/dL — ABNORMAL LOW (ref 6.5–8.1)

## 2020-10-13 MED ORDER — ONDANSETRON HCL 4 MG/2ML IJ SOLN: 4.0000 mg | Freq: Four times a day (QID) | INTRAMUSCULAR | Status: DC | PRN

## 2020-10-13 MED ORDER — HEPARIN SODIUM (PORCINE) 5000 UNIT/ML IJ SOLN: 5000.0000 [IU] | Freq: Three times a day (TID) | INTRAMUSCULAR | Status: DC

## 2020-10-13 MED ORDER — ACETAMINOPHEN 650 MG RE SUPP: 650.0000 mg | Freq: Four times a day (QID) | RECTAL | Status: DC | PRN

## 2020-10-13 MED ORDER — LORAZEPAM 2 MG/ML IJ SOLN
1.0000 mg | Freq: Once | INTRAMUSCULAR | Status: AC
  Administered 2020-10-13: 1 mg via INTRAMUSCULAR
  Filled 2020-10-13: qty 1

## 2020-10-13 MED ORDER — ACETAMINOPHEN 325 MG PO TABS
650.0000 mg | ORAL_TABLET | Freq: Four times a day (QID) | ORAL | Status: DC | PRN
  Administered 2020-10-21: 650 mg via ORAL
  Filled 2020-10-13: qty 2

## 2020-10-13 MED ORDER — LORAZEPAM 1 MG PO TABS
1.0000 mg | ORAL_TABLET | Freq: Once | ORAL | Status: DC
  Filled 2020-10-13 (×2): qty 1

## 2020-10-13 MED ORDER — HYDROMORPHONE HCL 1 MG/ML IJ SOLN
0.5000 mg | INTRAMUSCULAR | Status: DC | PRN
  Administered 2020-10-14: 0.5 mg via INTRAVENOUS
  Filled 2020-10-13: qty 1

## 2020-10-13 MED ORDER — ONDANSETRON HCL 4 MG PO TABS: 4.0000 mg | ORAL_TABLET | Freq: Four times a day (QID) | ORAL | Status: DC | PRN

## 2020-10-13 MED ORDER — HYDROCODONE-ACETAMINOPHEN 5-325 MG PO TABS: 1.0000 | ORAL_TABLET | Freq: Four times a day (QID) | ORAL | Status: DC | PRN

## 2020-10-13 MED ORDER — HALOPERIDOL LACTATE 5 MG/ML IJ SOLN
5.0000 mg | Freq: Once | INTRAMUSCULAR | Status: AC
  Administered 2020-10-13: 5 mg via INTRAMUSCULAR
  Filled 2020-10-13: qty 1

## 2020-10-13 NOTE — ED Notes (Signed)
Posey Pad placed under pt . Call bell In reach . stretcher locked and lowered.
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
## 2020-10-13 NOTE — Progress Notes (Signed)
Called about hip fracture. Recommend hip fracture protocol admit, will plan for hip hemi tomorrow if possible.

## 2020-10-13 NOTE — ED Provider Notes (Signed)
[facility] EMERGENCY DEPARTMENT Provider Note   CSN: [id number] Arrival date & time: 10/13/20  1636     History Chief Complaint  Patient presents with  . Altered Mental Status  . skin tear    Robin Guzman is a 86 y.o. female.  Patient brought in from [place].  For confusion for the past few days they stated increased jaundice but she has a lot of bruising everywhere.  Mostly to her left side.  Has a skin tear to her left upper arm.  Patient has a history of dementia.  Today is aggressive.  Patient with recent evaluation in the emergency department on November 19 for a fall.  She had CT of her head and x-ray of her left humerus at that time without any acute findings.  Patient was also admitted to the hospital November 10 through November 15 for choledocholithiasis.  Underwent MRCP had removal of 11 mm stone from her common bile duct.  At that time family opted not to go forward with gallbladder removal.  Patient prior to that admission was ambulatory usually with a cane.  She has been having some increased falls and some increased confusion since August.  Family states that yesterday she was quite alert so some days she is more alert than others and other days like today she is very confused.        Past Medical History:  Diagnosis Date  . Arthritis   . CAD (coronary artery disease), native coronary artery    07/27/17 nonobstructive, EF 25-35% by LV gram  . CHF (congestive heart failure) ([place])   . Dementia ([place])   . Diabetes mellitus without complication ([place])   . Dyslipidemia   . Dyspnea   . Dyspnea on exertion   . Gallstone 09/2020  . History of stress test 07/05/2006   High risk scan cardiac cathe was recommended.  [name] Hx of echocardiogram 04/09/2011   EF 55% Mildly hypertrophic left ventricle with normal systolic function, Moderate (grade II) diastolic dysfunction. Elevated left atrial pressure. No significant valvular abnormalities. No pericardial  effusion. Mild to moderate pulmonary arterial hypertension.  . Hypercholesteremia   . Hypertension   . Myocardial infarct ([place])   . Skin cancer    HISTORY OF     Patient Active Problem List   Diagnosis Date Noted  . Closed left hip fracture, initial encounter ([place]) 10/13/2020  . Moderate protein malnutrition ([place]) 10/13/2020  . Choledocholithiasis 10/01/2020  . Compression fracture of L1 vertebra (H[facility]) 06/23/2020  . Leukocytosis 06/23/2020  . Macrocytic anemia 06/23/2020  . Hyperglycemia due to diabetes mellitus ([place]) 06/23/2020  . Thrombocytopenia ([place]) 06/23/2020  . Back pain 06/23/2020  . Fall at home, initial encounter 06/23/2020  . SVT (supraventricular tachycardia) ([place]) 10/23/2018  . PAT (paroxysmal atrial tachycardia) ([place]) 04/23/2018  . LBBB (left bundle branch block) 04/23/2018  . Dyslipidemia 04/23/2018  . Atherosclerosis of aorta ([place]) 04/23/2018  . Acute systolic heart failure ([place]) 07/28/2017  . Sundowning 07/28/2017  . NSTEMI (non-ST elevated myocardial infarction) ([place]) 07/26/2017  . Closed 3-part fracture of proximal humerus, left, with routine healing, subsequent encounter 12/02/2016  . UTI (urinary tract infection) 12/25/2013  . Dyspnea 12/25/2013  . Near syncope 12/25/2013  . CAD (coronary artery disease) 12/25/2013  . Chronic diastolic heart failure ([place]) 08/07/2013  . Essential hypertension 08/07/2013  . Controlled type 2 diabetes mellitus without complication, without long-term current use of insulin ([place]) 08/07/2013  . Hypercholesteremia 08/07/2013    Past Surgical History:  Procedure Laterality Date  . ABDOMINAL HYSTERECTOMY    . CATARACT EXTRACTION W/PHACO Right 05/20/2017   Procedure: CATARACT EXTRACTION PHACO AND INTRAOCULAR LENS PLACEMENT RIGHT EYE;  Surgeon: Tonny Branch, MD;  Location: [facility];  Service: Ophthalmology;  Laterality: Right;  CDE: 17.77  . CATARACT EXTRACTION W/PHACO Left 06/21/2017   Procedure: CATARACT EXTRACTION PHACO AND  INTRAOCULAR LENS PLACEMENT (IOC);  Surgeon: Tonny Branch, MD;  Location: [facility];  Service: Ophthalmology;  Laterality: Left;  CDE: 8.90  . ERCP N/A 10/03/2020   Procedure: ENDOSCOPIC RETROGRADE CHOLANGIOPANCREATOGRAPHY (ERCP);  Surgeon: Arta Silence, MD;  Location: [facility] ENDOSCOPY;  Service: Endoscopy;  Laterality: N/A;  . LEFT HEART CATH AND CORONARY ANGIOGRAPHY N/A 07/27/2017   Procedure: LEFT HEART CATH AND CORONARY ANGIOGRAPHY;  Surgeon: Wellington Hampshire, MD;  Location: [facility] LAB;  Service: Cardiovascular;  Laterality: N/A;  . REMOVAL OF STONES  10/03/2020   Procedure: REMOVAL OF STONES;  Surgeon: Arta Silence, MD;  Location: [facility] ENDOSCOPY;  Service: Endoscopy;;  . SPHINCTEROTOMY  10/03/2020   Procedure: SPHINCTEROTOMY;  Surgeon: Arta Silence, MD;  Location: [facility] ENDOSCOPY;  Service: Endoscopy;;     OB History    Gravida  3   Para  3   Term  3   Preterm      AB      Living        SAB      TAB      Ectopic      Multiple      Live Births              Family History  Problem Relation Age of Onset  . Cancer Mother   . Heart Problems Father   . Cancer Brother        brain  . Cancer Sister        brain    Social History   Tobacco Use  . Smoking status: Never Smoker  . Smokeless tobacco: Never Used  Vaping Use  . Vaping Use: Never used  Substance Use Topics  . Alcohol use: No  . Drug use: No    Home Medications Prior to Admission medications   Medication Sig Start Date End Date Taking? Authorizing Provider  carvedilol (COREG) 25 MG tablet Take 1 tablet (25 mg total) by mouth 2 (two) times daily with a meal. 10/23/18  Yes Croitoru, Mihai, MD  famotidine (PEPCID) 20 MG tablet Take 20 mg by mouth 2 (two) times daily.   Yes [provider]  metFORMIN (GLUCOPHAGE) 500 MG tablet Take 500 mg by mouth 2 (two) times daily with a meal.   Yes [provider]  pantoprazole (PROTONIX) 40 MG tablet Take 1 tablet (40 mg total) by mouth 2  (two) times daily. 10/06/20  Yes Swayze, Ava, DO  senna-docusate (SENOKOT-S) 8.6-50 MG tablet Take 2 tablets by mouth 2 (two) times daily. 06/25/20  Yes Emokpae, Courage, MD  acetaminophen (TYLENOL) 500 MG tablet Take 1,000 mg by mouth in the morning and at bedtime.    [provider]  aspirin EC 81 MG tablet Take 1 tablet (81 mg total) by mouth daily with breakfast. 06/25/20   Emokpae, Courage, MD  atorvastatin (LIPITOR) 10 MG tablet Take 10 mg by mouth daily.    [provider]  ferrous sulfate 325 (65 FE) MG tablet Take 325 mg by mouth daily.    [provider]  HYDROcodone-acetaminophen (NORCO/VICODIN) 5-325 MG tablet Take 0.5 tablets by mouth every 4 (four) hours  as needed for moderate pain or severe pain. Patient not taking: Reported on 10/13/2020 06/25/20   Roxan Hockey, MD  insulin aspart (NOVOLOG) 100 UNIT/ML FlexPen Inject 0-6 Units into the skin 3 (three) times daily with meals. insulin aspart (novoLOG) injection 0-6 Units 0-6 Units Subcutaneous, 3 times daily with meals CBG < 70: Implement Hypoglycemia Standing Orders and refer to Hypoglycemia Standing Orders sidebar report  CBG 70 - 120: 0 unit CBG 121 - 150: 0 unit  CBG 151 - 200: 0 unit CBG 201 - 250: 1 units CBG 251 - 300:  2units CBG 301 - 350:  3 units  CBG 351 - 400:  5 units  CBG > 400: 6 units Patient not taking: Reported on 10/01/2020 06/25/20   Roxan Hockey, MD  LORazepam (ATIVAN) 0.5 MG tablet Take 1 tablet (0.5 mg total) by mouth every 12 (twelve) hours as needed for anxiety or sleep. 06/25/20 06/25/21  Roxan Hockey, MD  Multiple Vitamins-Minerals (CENTRUM ADULTS) TABS Take 1 tablet by mouth daily. Patient not taking: Reported on 10/13/2020    [provider]  polyethylene glycol (MIRALAX) 17 g packet Take 17 g by mouth daily. 06/25/20   Roxan Hockey, MD    Allergies    Daypro [oxaprozin] and Advil [ibuprofen]  Review of Systems   Review of Systems  Unable to perform ROS: Dementia     Physical Exam Updated Vital Signs BP 105/67   Pulse (!) 56   Temp 98 F (36.7 C) (Oral)   Resp (!) 21   Ht 1.626 m (5\' 4" )   Wt 66 kg   SpO2 99%   BMI 24.98 kg/m   Physical Exam Vitals and nursing note reviewed.  Constitutional:      General: She is not in acute distress.    Appearance: Normal appearance. She is well-developed.  HENT:     Head: Normocephalic and atraumatic.  Eyes:     Extraocular Movements: Extraocular movements intact.     Conjunctiva/sclera: Conjunctivae normal.     Pupils: Pupils are equal, round, and reactive to light.  Cardiovascular:     Rate and Rhythm: Normal rate and regular rhythm.     Heart sounds: No murmur heard.   Pulmonary:     Effort: Pulmonary effort is normal. No respiratory distress.     Breath sounds: Normal breath sounds.  Abdominal:     Palpations: Abdomen is soft.     Tenderness: There is no abdominal tenderness.     Comments: Lots of ecchymosis to her left flank area and left lower back area.  Musculoskeletal:        General: Signs of injury present.     Cervical back: Normal range of motion and neck supple.     Comments: Dark ecchymosis and bruising to the left side of her body including her left upper extremity that has a superficial skin tear measuring about 3 cm.  It is not gaping.  No active bleeding.  Patient has both lower extremities wrapped.  Good cap refill to both toes.  Skin:    General: Skin is warm and dry.     Capillary Refill: Capillary refill takes less than 2 seconds.  Neurological:     Mental Status: She is alert.     Comments: Patient alert but very confused.  Gets agitated and aggressive at times.  Keeps wanting to leave keeps thinking that people are in her house.  Patient moving all 4 extremities does try to get out of the  bed.     ED Results / Procedures / Treatments   Labs (all labs ordered are listed, but only abnormal results are displayed) Labs Reviewed  CBC - Abnormal; Notable for the  following components:      Result Value   WBC 10.7 (*)    RBC 3.17 (*)    Hemoglobin 9.8 (*)    HCT 32.5 (*)    MCV 102.5 (*)    RDW 23.4 (*)    All other components within normal limits  BASIC METABOLIC PANEL - Abnormal; Notable for the following components:   Glucose, Bld 160 (*)    GFR, Estimated 56 (*)    All other components within normal limits  HEPATIC FUNCTION PANEL - Abnormal; Notable for the following components:   Total Protein 5.9 (*)    Albumin 2.9 (*)    AST 49 (*)    Alkaline Phosphatase 303 (*)    Total Bilirubin 3.6 (*)    Bilirubin, Direct 1.6 (*)    Indirect Bilirubin 2.0 (*)    All other components within normal limits  RESP PANEL BY RT-PCR (FLU A&B, COVID) ARPGX2  LIPASE, BLOOD  CBC  COMPREHENSIVE METABOLIC PANEL    EKG EKG Interpretation  Date/Time:  Monday October 13 2020 22:29:32 EST Ventricular Rate:  57 PR Interval:    QRS Duration: 133 QT Interval:  482 QTC Calculation: 470 R Axis:   -32 Text Interpretation: Sinus rhythm Left bundle branch block No significant change since last tracing Confirmed by Fredia Sorrow [phone]) on 10/13/2020 10:32:08 PM   Radiology CT ABDOMEN PELVIS WO CONTRAST  Result Date: 10/13/2020 CLINICAL DATA:  Abdominal trauma, jaundice. EXAM: CT ABDOMEN AND PELVIS WITHOUT CONTRAST TECHNIQUE: Multidetector CT imaging of the abdomen and pelvis was performed following the standard protocol without IV contrast. COMPARISON:  10/01/2020 FINDINGS: Lower chest: Coronary and aortic atherosclerosis. Mild cardiomegaly. Hepatobiliary: Gallstones in the gallbladder. No well-defined or definite residual biliary dilatation. Pancreas: Unremarkable Spleen: Unremarkable Adrenals/Urinary Tract: Bilateral renal cysts of varying complexity are unchanged. 1.4 by 1.4 cm myelolipoma of the lateral limb of the right adrenal gland. Stomach/Bowel: Scattered colonic diverticula. There a few diverticula of the terminal ileum is well. Sigmoid colon  diverticulosis without active diverticulitis. Vascular/Lymphatic: Aortoiliac atherosclerotic vascular disease. No pathologic adenopathy is identified. Reproductive: Uterus absent. Stable 9.9 by 6.7 cm fluid density structure along the vaginal cuff/left adnexa. Other: Stable trace perihepatic ascites. Increased flank edema bilaterally and increased edema lateral to the hips in the subcutaneous tissues, and tracking into the right upper thigh. Musculoskeletal: Healing fractures of the left lower fifth and sixth ribs. Subacute subcapital fracture of the left femoral neck, without current volume loss or avascular necrosis of the femoral head. 60% compression fracture at L1 fracture plane along the inferior endplate and about 4 mm of posterior bony retropulsion, not appreciably changed from 10/01/2020. Lower thoracic spondylosis with bridging fusion. Grade 1 degenerative anterolisthesis at L4-5. IMPRESSION: 1. Subacute subcapital fracture of the left femoral neck, without current volume loss or avascular necrosis of the femoral head. 2. Healing fractures of the left lower fifth and sixth ribs. 3. 60% compression fracture at L1, not appreciably changed from 10/01/2020. 4. Other imaging findings of potential clinical significance: Coronary atherosclerosis. Mild cardiomegaly. Bilateral renal cysts of varying complexity are unchanged. Sigmoid colon diverticulosis. Small myelolipoma of the right adrenal gland. Increased edema lateral to the hips in the subcutaneous tissues, and tracking into the right upper thigh. Stable trace perihepatic ascites. Cholelithiasis. Stable simple appearing  large fluid collection along the vaginal cuff/left adnexa. 5. Aortic atherosclerosis. Aortic Atherosclerosis (ICD10-I70.0). Electronically Signed   By: Van Clines M.D.   On: 10/13/2020 20:30   CT Head Wo Contrast  Result Date: 10/13/2020 CLINICAL DATA:  Confusion, jaundice, unexplained skin tear on the left upper arm EXAM: CT HEAD  WITHOUT CONTRAST TECHNIQUE: Contiguous axial images were obtained from the base of the skull through the vertex without intravenous contrast. COMPARISON:  CT head 10/13/2020 FINDINGS: Brain: The brainstem, cerebellum, cerebral peduncles, thalami, basal ganglia, basilar cisterns, and ventricular system appear within normal limits. Periventricular white matter and corona radiata hypodensities favor chronic ischemic microvascular white matter disease. No intracranial hemorrhage, mass lesion, or acute CVA. Vascular: There is atherosclerotic calcification of the cavernous carotid arteries bilaterally. Skull: Unremarkable Sinuses/Orbits: Mucous retention cyst in the right maxillary sinus. Chronic right sphenoid sinusitis. Other: No supplemental non-categorized findings. IMPRESSION: 1. No acute intracranial findings. 2. Periventricular white matter and corona radiata hypodensities favor chronic ischemic microvascular white matter disease. 3. Mucous retention cyst in the right maxillary sinus. Chronic right sphenoid sinusitis. Electronically Signed   By: Van Clines M.D.   On: 10/13/2020 20:13   Chest Portable 1 View  Result Date: 10/13/2020 CLINICAL DATA:  Preop left hip fracture EXAM: PORTABLE CHEST 1 VIEW COMPARISON:  06/22/2020 FINDINGS: The heart size and mediastinal contours are within normal limits. Both lungs are clear. The visualized skeletal structures are unremarkable. IMPRESSION: No active disease. Electronically Signed   By: Ulyses Jarred M.D.   On: 10/13/2020 23:37   DG Humerus Left  Result Date: 10/13/2020 CLINICAL DATA:  Bruising and pain along the left arm EXAM: LEFT HUMERUS - 2+ VIEW COMPARISON:  Radiographs from 10/10/2020 FINDINGS: Deformity of the proximal humeral shaft related to a remote prior surgical neck fracture (originally shown on 11/18/2016). No new fracture is identified. No acute bony findings or abnormal gas tracking in the soft tissues. The focal soft tissue swelling  overlying the mid humerus laterally is reduced compared to the prior exam. IMPRESSION: 1. Soft tissue swelling overlying the mid humerus laterally is reduced compared to the prior exam. No acute bony findings. 2. Deformity from old healed proximal humeral fracture. Electronically Signed   By: Van Clines M.D.   On: 10/13/2020 20:15    Procedures Procedures (including critical care time)  CRITICAL CARE Performed by: Fredia Sorrow Total critical care time: 35 minutes Critical care time was exclusive of separately billable procedures and treating other patients. Critical care was necessary to treat or prevent imminent or life-threatening deterioration. Critical care was time spent personally by me on the following activities: development of treatment plan with patient and/or surrogate as well as nursing, discussions with consultants, evaluation of patient's response to treatment, examination of patient, obtaining history from patient or surrogate, ordering and performing treatments and interventions, ordering and review of laboratory studies, ordering and review of radiographic studies, pulse oximetry and re-evaluation of patient's condition.   Medications Ordered in ED Medications  LORazepam (ATIVAN) tablet 1 mg (1 mg Oral Not Given 10/13/20 1832)  HYDROcodone-acetaminophen (NORCO/VICODIN) 5-325 MG per tablet 1 tablet (has no administration in time range)  heparin injection 5,000 Units (has no administration in time range)  HYDROmorphone (DILAUDID) injection 0.5 mg (has no administration in time range)  acetaminophen (TYLENOL) tablet 650 mg (has no administration in time range)    Or  acetaminophen (TYLENOL) suppository 650 mg (has no administration in time range)  ondansetron (ZOFRAN) tablet 4 mg (has no administration  in time range)    Or  ondansetron (ZOFRAN) injection 4 mg (has no administration in time range)  haloperidol lactate (HALDOL) injection 5 mg (5 mg Intramuscular Given  10/13/20 1733)  LORazepam (ATIVAN) injection 1 mg (1 mg Intramuscular Given 10/13/20 1843)    ED Course  I have reviewed the triage vital signs and the nursing notes.  Pertinent labs & imaging results that were available during my care of the patient were reviewed by me and considered in my medical decision making (see chart for details).    MDM Rules/Calculators/A&P                          Patient combative here required Haldol and Ativan to settle her down we did CT head.  Because of all the bruising and failure the quickest way to evaluate any abdominal injuries or pelvic injury or hip injuries was just a CT abdomen and pelvis.  Patient also had repeat x-ray of her left humerus.  No acute new findings to the left humerus.  Patient CT of the abdomen had no internal injuries.  But did find a left hip fracture.  That appears to be subacute.  She had last CT on November 10 there was no evidence of any injury at that time so this is happened sometime since then.  Had long discussion with family they do want consideration for hip repairs.  Spoke to orthopedist on call at [facility] Dr. Griffin Basil.  He will have the hospitalist admit the patient will see him in consultation hospitalist service contacted here for admission there.     Final Clinical Impression(s) / ED Diagnoses Final diagnoses:  Closed fracture of left hip, initial encounter ([place])  Fall, initial encounter  Dementia with behavioral disturbance, unspecified dementia type [facility])    Rx / DC Orders ED Discharge Orders    None       Fredia Sorrow, MD 10/13/20 2342

## 2020-10-13 NOTE — ED Notes (Signed)
Pt is combative towards staff. Pt refuses to keep clothes on. MD mad aware.

## 2020-10-13 NOTE — ED Notes (Signed)
Pt asleep at this time

## 2020-10-13 NOTE — ED Notes (Signed)
Pt remains combative towards staff. Also spitting. Xray unable to be done at this time. MD made aware.

## 2020-10-13 NOTE — ED Triage Notes (Addendum)
Pt brought in from [place] due to confusion for several days, increase jaundice and large unexplainable large skin tear to left upper arm with bruising. Pt also noted to have large amount of bruising to lower back and flank area

## 2020-10-13 NOTE — ED Notes (Signed)
Per nurse at [facility], pt is 90% non-ambulatory. She states pt stays in bed or wheelchair most of the time with the exception of her walking small distances in her room. MD made aware.

## 2020-10-13 NOTE — H&P (Signed)
History and Physical    ADLAI NIEBLAS ZOX:[id number] DOB: [birth year] DOA: 10/13/2020  PCP: Celene Squibb, MD   Patient coming from: [facility].   I have personally briefly reviewed patient's old medical records in [place]  Chief Complaint: Fall.  HPI: Robin Guzman is a 86 y.o. female with medical history significant of osteoarthritis, CAD, history of MI, chronic diastolic heart failure, dementia, type 2 diabetes, hyperlipidemia, dyspnea on exertion, hypertension, history of skin cancer, history of cholelithiasis with acute cholestatic hepatitis admitted from November 10 November 15 and underwent ERCP and removal of her CBD stone who is coming from the nursing home due to having a fall at the facility.  The patient had a recent fall on November 19 and was seen in the emergency department.  She is currently sedated, has history of dementia and is unable to provide further history.  ED Course: Initial vital signs were temperature 98 F, pulse 75, respirations 26, blood pressure 165/90 mmHg O2 sat 97% on room air.  The patient received 5 mg of haloperidol and 1 mg of lorazepam IM in the emergency department.  Labwork: SARS 2 and influenza PCR was negative.  CBC shows a white count of 10.7, hemoglobin 9.8 g/dL with an elevated MCV at 102.5 fL and platelets of 204.  BMP was normal except for glucose of 160 mg/dL.  Total protein was 5.9 and albumin 2.9 g/dL.  AST was 49, ALT 27, alkaline phosphatase 303, total bilirubin 3.6 and direct bilirubin 1.6 mg/dL.  Lipase was only 27 units/L.  Imaging: Left humerus radiograph shows soft tissue swelling overlying the mid humerus laterally is reduced compared to the prior exam.  All healed proximal humeral fracture.  CT head without contrast did not show any acute intracranial findings.  There is periventricular white matter and corona radiata hypodensities likely from chronic ischemic microvascular white matter disease.  CT abdomen/pelvis without  contrast show evidence of acute subcapital fracture of the left femoral neck, without current volume loss or avascular necrosis of the femoral head.  There were healing fractures of the left lower fifth and sixth ribs.  There was a 60% compression fracture at L1 not changed from 10/01/2020.  Please see images and full radiology report for further detail.  Review of Systems: As per HPI otherwise all other systems reviewed and are negative.  Past Medical History:  Diagnosis Date  . Arthritis   . CAD (coronary artery disease), native coronary artery    07/27/17 nonobstructive, EF 25-35% by LV gram  . CHF (congestive heart failure) ([place])   . Dementia ([place])   . Diabetes mellitus without complication ([place])   . Dyslipidemia   . Dyspnea   . Dyspnea on exertion   . Gallstone 09/2020  . History of stress test 07/05/2006   High risk scan cardiac cathe was recommended.  [name] Hx of echocardiogram 04/09/2011   EF 55% Mildly hypertrophic left ventricle with normal systolic function, Moderate (grade II) diastolic dysfunction. Elevated left atrial pressure. No significant valvular abnormalities. No pericardial effusion. Mild to moderate pulmonary arterial hypertension.  . Hypercholesteremia   . Hypertension   . Myocardial infarct ([place])   . Skin cancer    HISTORY OF    Past Surgical History:  Procedure Laterality Date  . ABDOMINAL HYSTERECTOMY    . CATARACT EXTRACTION W/PHACO Right 05/20/2017   Procedure: CATARACT EXTRACTION PHACO AND INTRAOCULAR LENS PLACEMENT RIGHT EYE;  Surgeon: Tonny Branch, MD;  Location: [facility];  Service: Ophthalmology;  Laterality: Right;  CDE: 17.77  . CATARACT EXTRACTION W/PHACO Left 06/21/2017   Procedure: CATARACT EXTRACTION PHACO AND INTRAOCULAR LENS PLACEMENT (IOC);  Surgeon: Tonny Branch, MD;  Location: [facility];  Service: Ophthalmology;  Laterality: Left;  CDE: 8.90  . ERCP N/A 10/03/2020   Procedure: ENDOSCOPIC RETROGRADE CHOLANGIOPANCREATOGRAPHY (ERCP);  Surgeon: Arta Silence, MD;  Location: [facility] ENDOSCOPY;  Service: Endoscopy;  Laterality: N/A;  . LEFT HEART CATH AND CORONARY ANGIOGRAPHY N/A 07/27/2017   Procedure: LEFT HEART CATH AND CORONARY ANGIOGRAPHY;  Surgeon: Wellington Hampshire, MD;  Location: [facility];  Service: Cardiovascular;  Laterality: N/A;  . REMOVAL OF STONES  10/03/2020   Procedure: REMOVAL OF STONES;  Surgeon: Arta Silence, MD;  Location: [facility] ENDOSCOPY;  Service: Endoscopy;;  . SPHINCTEROTOMY  10/03/2020   Procedure: SPHINCTEROTOMY;  Surgeon: Arta Silence, MD;  Location: [facility] ENDOSCOPY;  Service: Endoscopy;;   Social History  reports that she has never smoked. She has never used smokeless tobacco. She reports that she does not drink alcohol and does not use drugs.  Allergies  Allergen Reactions  . Daypro [Oxaprozin] Shortness Of Breath and Other (See Comments)    weakness  . Advil [Ibuprofen] Other (See Comments)    weakness   Family History  Problem Relation Age of Onset  . Cancer Mother   . Heart Problems Father   . Cancer Brother        brain  . Cancer Sister        brain   Prior to Admission medications   Medication Sig Start Date End Date Taking? Authorizing Provider  carvedilol (COREG) 25 MG tablet Take 1 tablet (25 mg total) by mouth 2 (two) times daily with a meal. 10/23/18  Yes Croitoru, Mihai, MD  famotidine (PEPCID) 20 MG tablet Take 20 mg by mouth 2 (two) times daily.   Yes [provider]  metFORMIN (GLUCOPHAGE) 500 MG tablet Take 500 mg by mouth 2 (two) times daily with a meal.   Yes [provider]  pantoprazole (PROTONIX) 40 MG tablet Take 1 tablet (40 mg total) by mouth 2 (two) times daily. 10/06/20  Yes Swayze, Ava, DO  senna-docusate (SENOKOT-S) 8.6-50 MG tablet Take 2 tablets by mouth 2 (two) times daily. 06/25/20  Yes Emokpae, Courage, MD  acetaminophen (TYLENOL) 500 MG tablet Take 1,000 mg by mouth in the morning and at bedtime.    [provider]  aspirin EC 81 MG tablet  Take 1 tablet (81 mg total) by mouth daily with breakfast. 06/25/20   Emokpae, Courage, MD  atorvastatin (LIPITOR) 10 MG tablet Take 10 mg by mouth daily.    [provider]  ferrous sulfate 325 (65 FE) MG tablet Take 325 mg by mouth daily.    [provider]  HYDROcodone-acetaminophen (NORCO/VICODIN) 5-325 MG tablet Take 0.5 tablets by mouth every 4 (four) hours as needed for moderate pain or severe pain. Patient not taking: Reported on 10/13/2020 06/25/20   Roxan Hockey, MD  insulin aspart (NOVOLOG) 100 UNIT/ML FlexPen Inject 0-6 Units into the skin 3 (three) times daily with meals. insulin aspart (novoLOG) injection 0-6 Units 0-6 Units Subcutaneous, 3 times daily with meals CBG < 70: Implement Hypoglycemia Standing Orders and refer to Hypoglycemia Standing Orders sidebar report  CBG 70 - 120: 0 unit CBG 121 - 150: 0 unit  CBG 151 - 200: 0 unit CBG 201 - 250: 1 units CBG 251 - 300:  2units CBG 301 - 350:  3 units  CBG 351 - 400:  5 units  CBG > 400: 6 units Patient not taking: Reported on 10/01/2020 06/25/20   Roxan Hockey, MD  LORazepam (ATIVAN) 0.5 MG tablet Take 1 tablet (0.5 mg total) by mouth every 12 (twelve) hours as needed for anxiety or sleep. 06/25/20 06/25/21  Roxan Hockey, MD  Multiple Vitamins-Minerals (CENTRUM ADULTS) TABS Take 1 tablet by mouth daily. Patient not taking: Reported on 10/13/2020    [provider]  polyethylene glycol (MIRALAX) 17 g packet Take 17 g by mouth daily. 06/25/20   Roxan Hockey, MD   Physical Exam: Vitals:   10/13/20 2100 10/13/20 2230 10/13/20 2231 10/13/20 2232  BP:   136/63   Pulse: 63 (!) 55 69   Resp:   16 13  Temp:      TempSrc:      SpO2: 98% 98% 98%   Weight:      Height:       Constitutional: Sedated.  In NAD. Eyes: PERRL, lids and conjunctivae normal ENMT: Mucous membranes are mildly dry. Posterior pharynx clear of any exudate or lesions. Neck: normal, supple, no masses, no thyromegaly Respiratory:  Decreased breath sounds in bases, otherwise clear to auscultation bilaterally, no wheezing, no crackles. Normal respiratory effort. No accessory muscle use.  Cardiovascular: Regular rate and rhythm, no murmurs / rubs / gallops. No extremity edema. 2+ pedal pulses. No carotid bruits.  Abdomen: Soft, no tenderness, no masses palpated. No hepatosplenomegaly. Bowel sounds positive.  Musculoskeletal: Dressings on both extremities.  No clubbing / cyanosis.  Tender left hip area with severely impaired ROM, no contractures.  Relaxed muscle tone.  Skin: 3 cm LUE skin tear.  Areas of ecchymosis on left sided face, LUE, LLE, left lower back and flank area. Neurologic: Somnolent.  Responds to tactile stimuli. Psychiatric: Sedated.  Unable to answer questions.  Labs on Admission: I have personally reviewed following labs and imaging studies  CBC: Recent Labs  Lab 10/13/20 1744  WBC 10.7*  HGB 9.8*  HCT 32.5*  MCV 102.5*  PLT 825    Basic Metabolic Panel: Recent Labs  Lab 10/13/20 1744  NA 137  K 4.0  CL 106  CO2 23  GLUCOSE 160*  BUN 13  CREATININE 0.96  CALCIUM 9.0    GFR: Estimated Creatinine Clearance: 36.4 mL/min (by C-G formula based on SCr of 0.96 mg/dL).  Liver Function Tests: Recent Labs  Lab 10/13/20 1744  AST 49*  ALT 27  ALKPHOS 303*  BILITOT 3.6*  PROT 5.9*  ALBUMIN 2.9*    Radiological Exams on Admission: CT ABDOMEN PELVIS WO CONTRAST  Result Date: 10/13/2020 CLINICAL DATA:  Abdominal trauma, jaundice. EXAM: CT ABDOMEN AND PELVIS WITHOUT CONTRAST TECHNIQUE: Multidetector CT imaging of the abdomen and pelvis was performed following the standard protocol without IV contrast. COMPARISON:  10/01/2020 FINDINGS: Lower chest: Coronary and aortic atherosclerosis. Mild cardiomegaly. Hepatobiliary: Gallstones in the gallbladder. No well-defined or definite residual biliary dilatation. Pancreas: Unremarkable Spleen: Unremarkable Adrenals/Urinary Tract: Bilateral renal cysts  of varying complexity are unchanged. 1.4 by 1.4 cm myelolipoma of the lateral limb of the right adrenal gland. Stomach/Bowel: Scattered colonic diverticula. There a few diverticula of the terminal ileum is well. Sigmoid colon diverticulosis without active diverticulitis. Vascular/Lymphatic: Aortoiliac atherosclerotic vascular disease. No pathologic adenopathy is identified. Reproductive: Uterus absent. Stable 9.9 by 6.7 cm fluid density structure along the vaginal cuff/left adnexa. Other: Stable trace perihepatic ascites. Increased flank edema bilaterally and increased edema lateral to the hips in the subcutaneous tissues,  and tracking into the right upper thigh. Musculoskeletal: Healing fractures of the left lower fifth and sixth ribs. Subacute subcapital fracture of the left femoral neck, without current volume loss or avascular necrosis of the femoral head. 60% compression fracture at L1 fracture plane along the inferior endplate and about 4 mm of posterior bony retropulsion, not appreciably changed from 10/01/2020. Lower thoracic spondylosis with bridging fusion. Grade 1 degenerative anterolisthesis at L4-5. IMPRESSION: 1. Subacute subcapital fracture of the left femoral neck, without current volume loss or avascular necrosis of the femoral head. 2. Healing fractures of the left lower fifth and sixth ribs. 3. 60% compression fracture at L1, not appreciably changed from 10/01/2020. 4. Other imaging findings of potential clinical significance: Coronary atherosclerosis. Mild cardiomegaly. Bilateral renal cysts of varying complexity are unchanged. Sigmoid colon diverticulosis. Small myelolipoma of the right adrenal gland. Increased edema lateral to the hips in the subcutaneous tissues, and tracking into the right upper thigh. Stable trace perihepatic ascites. Cholelithiasis. Stable simple appearing large fluid collection along the vaginal cuff/left adnexa. 5. Aortic atherosclerosis. Aortic Atherosclerosis  (ICD10-I70.0). Electronically Signed   By: Van Clines M.D.   On: 10/13/2020 20:30   CT Head Wo Contrast  Result Date: 10/13/2020 CLINICAL DATA:  Confusion, jaundice, unexplained skin tear on the left upper arm EXAM: CT HEAD WITHOUT CONTRAST TECHNIQUE: Contiguous axial images were obtained from the base of the skull through the vertex without intravenous contrast. COMPARISON:  CT head 10/13/2020 FINDINGS: Brain: The brainstem, cerebellum, cerebral peduncles, thalami, basal ganglia, basilar cisterns, and ventricular system appear within normal limits. Periventricular white matter and corona radiata hypodensities favor chronic ischemic microvascular white matter disease. No intracranial hemorrhage, mass lesion, or acute CVA. Vascular: There is atherosclerotic calcification of the cavernous carotid arteries bilaterally. Skull: Unremarkable Sinuses/Orbits: Mucous retention cyst in the right maxillary sinus. Chronic right sphenoid sinusitis. Other: No supplemental non-categorized findings. IMPRESSION: 1. No acute intracranial findings. 2. Periventricular white matter and corona radiata hypodensities favor chronic ischemic microvascular white matter disease. 3. Mucous retention cyst in the right maxillary sinus. Chronic right sphenoid sinusitis. Electronically Signed   By: Van Clines M.D.   On: 10/13/2020 20:13   DG Humerus Left  Result Date: 10/13/2020 CLINICAL DATA:  Bruising and pain along the left arm EXAM: LEFT HUMERUS - 2+ VIEW COMPARISON:  Radiographs from 10/10/2020 FINDINGS: Deformity of the proximal humeral shaft related to a remote prior surgical neck fracture (originally shown on 11/18/2016). No new fracture is identified. No acute bony findings or abnormal gas tracking in the soft tissues. The focal soft tissue swelling overlying the mid humerus laterally is reduced compared to the prior exam. IMPRESSION: 1. Soft tissue swelling overlying the mid humerus laterally is reduced compared  to the prior exam. No acute bony findings. 2. Deformity from old healed proximal humeral fracture. Electronically Signed   By: Van Clines M.D.   On: 10/13/2020 20:15   10/03/2020 echocardiogram IMPRESSIONS:  1. Left ventricular ejection fraction, by estimation, is 55 to 60%. The  left ventricle has normal function. The left ventricle has no regional  wall motion abnormalities. There is mild left ventricular hypertrophy.  Left ventricular diastolic parameters  are consistent with Grade I diastolic dysfunction (impaired relaxation).  2. Right ventricular systolic function is normal. The right ventricular  size is normal. There is normal pulmonary artery systolic pressure. The  estimated right ventricular systolic pressure is 84.1 mmHg.  3. Left atrial size was mildly dilated.  4. The mitral valve is normal  in structure. Trivial mitral valve  regurgitation. No evidence of mitral stenosis.  5. The aortic valve is tricuspid. Aortic valve regurgitation is mild.  6. The inferior vena cava is normal in size with greater than 50%  respiratory variability, suggesting right atrial pressure of 3 mmHg.   EKG: Independently reviewed.  Vent. rate 57 BPM PR interval * ms QRS duration 133 ms QT/QTc 482/470 ms P-R-T axes 28 -32 56 Sinus rhythm Left bundle branch block  Assessment/Plan Principal Problem:   Closed left hip fracture, initial encounter (HCC) Admit to [facility]/inpatient. Supplemental oxygen as needed. Buck's traction per protocol. Analgesics as needed. Consult PT and OT. Consult dietitian. Consult TOC team. Orthopedic surgery to evaluate on arrival to [facility].  Active Problems:   Chronic diastolic heart failure (HCC) No signs of decompensation at this time. Solute and fluid restriction. Continue beta-blocker. Consider low-dose ACE inhibitor. Diuretic as needed.    Essential hypertension Continue carvedilol 25 mg p.o. twice daily. Monitor blood pressure and heart  rate.    Controlled type 2 diabetes mellitus without complication, without long-term current use of insulin (HCC) Currently NPO. CBG monitoring every 6 hours.    Hypercholesteremia No longer on atorvastatin.    CAD (coronary artery disease) Continue carvedilol. Off aspirin (NSAID side effects?) Off atorvastatin.    PAT (paroxysmal atrial tachycardia) (HCC) Continue carvedilol for rate control.    Macrocytic anemia Monitor H&H.    Choledocholithiasis LFTs improving post ERCP.    Moderate protein malnutrition ([place]) Consult nutritional services.    DVT prophylaxis: Lovenox SQ. Code Status:   DNR. Family Communication: Disposition Plan:   Patient is from:  [facility].  Anticipated DC to:  [facility].  Anticipated DC date:  10/17/2020.  Anticipated DC barriers: Clinical condition.  Consults called:  Please notify orthopedic surgery of the patient's arrival to [facility]. Admission status:  Inpatient/telemetry.    Severity of Illness:High due to given age, comorbidities in the setting of acute left hip fracture needing surgical repair.  Reubin Milan MD [facility]  How to contact the [facility] Attending or Consulting provider [place] or covering provider during after hours [place], for this patient?   1. Check the care team in [facility] and look for a) attending/consulting [facility] provider listed and b) the [facility] team listed 2. Log into [email or web address] and use [place]'s universal password to access. If you do not have the password, please contact the hospital operator. 3. Locate the [facility] provider you are looking for under [facility] and page to a number that you can be directly reached. 4. If you still have difficulty reaching the provider, please page the [facility] (Director on Call) for the Hospitalists listed on amion for assistance.  10/13/2020, 11:14 PM   This document was prepared using Dragon voice recognition software and may contain some unintended transcription  errors.

## 2020-10-14 ENCOUNTER — Inpatient Hospital Stay (HOSPITAL_COMMUNITY): Payer: Medicare Other | Admitting: Certified Registered Nurse Anesthetist

## 2020-10-14 ENCOUNTER — Inpatient Hospital Stay (HOSPITAL_COMMUNITY): Payer: Medicare Other

## 2020-10-14 ENCOUNTER — Encounter (HOSPITAL_COMMUNITY): Payer: Self-pay | Admitting: Internal Medicine

## 2020-10-14 ENCOUNTER — Encounter (HOSPITAL_COMMUNITY)
Admission: EM | Disposition: A | Discharge status: Skilled Nursing Facility | Payer: Self-pay | Source: Home / Self Care | Attending: Internal Medicine

## 2020-10-14 DIAGNOSIS — S72002A Fracture of unspecified part of neck of left femur, initial encounter for closed fracture: Secondary | ICD-10-CM | POA: Diagnosis not present

## 2020-10-14 HISTORY — PX: HIP PINNING,CANNULATED: SHX1758

## 2020-10-14 LAB — CBC
HCT: 29.4 % — ABNORMAL LOW (ref 36.0–46.0)
Hemoglobin: 9.2 g/dL — ABNORMAL LOW (ref 12.0–15.0)
MCH: 31.2 pg (ref 26.0–34.0)
MCHC: 31.3 g/dL (ref 30.0–36.0)
MCV: 99.7 fL (ref 80.0–100.0)
Platelets: 143 10*3/uL — ABNORMAL LOW (ref 150–400)
RBC: 2.95 MIL/uL — ABNORMAL LOW (ref 3.87–5.11)
RDW: 23.2 % — ABNORMAL HIGH (ref 11.5–15.5)
WBC: 10.5 10*3/uL (ref 4.0–10.5)
nRBC: 0 % (ref 0.0–0.2)

## 2020-10-14 LAB — GLUCOSE, CAPILLARY
Glucose-Capillary: 108 mg/dL — ABNORMAL HIGH (ref 70–99)
Glucose-Capillary: 113 mg/dL — ABNORMAL HIGH (ref 70–99)
Glucose-Capillary: 162 mg/dL — ABNORMAL HIGH (ref 70–99)

## 2020-10-14 LAB — CREATININE, SERUM
Creatinine, Ser: 0.88 mg/dL (ref 0.44–1.00)
GFR, Estimated: >60 mL/min (ref >60)

## 2020-10-14 LAB — CBG MONITORING, ED: Glucose-Capillary: 106 mg/dL — ABNORMAL HIGH (ref 70–99)

## 2020-10-14 SURGERY — FIXATION, FEMUR, NECK, PERCUTANEOUS, USING SCREW
Anesthesia: General | Site: Hip | Laterality: Left

## 2020-10-14 MED ORDER — POVIDONE-IODINE 10 % EX SWAB: 2.0000 "application " | Freq: Once | CUTANEOUS | Status: DC

## 2020-10-14 MED ORDER — METOCLOPRAMIDE HCL 5 MG PO TABS: 5.0000 mg | ORAL_TABLET | Freq: Three times a day (TID) | ORAL | Status: DC | PRN

## 2020-10-14 MED ORDER — PANTOPRAZOLE SODIUM 40 MG PO TBEC
40.0000 mg | DELAYED_RELEASE_TABLET | Freq: Two times a day (BID) | ORAL | Status: DC
  Administered 2020-10-14 – 2020-10-22 (×16): 40 mg via ORAL
  Filled 2020-10-14 (×16): qty 1

## 2020-10-14 MED ORDER — METFORMIN HCL 500 MG PO TABS: 500.0000 mg | ORAL_TABLET | Freq: Two times a day (BID) | ORAL | Status: DC

## 2020-10-14 MED ORDER — MEPERIDINE HCL 25 MG/ML IJ SOLN: 6.2500 mg | INTRAMUSCULAR | Status: DC | PRN

## 2020-10-14 MED ORDER — MAGNESIUM OXIDE 400 (241.3 MG) MG PO TABS
200.0000 mg | ORAL_TABLET | Freq: Two times a day (BID) | ORAL | Status: DC
  Administered 2020-10-14 – 2020-10-22 (×16): 200 mg via ORAL
  Filled 2020-10-14 (×16): qty 1

## 2020-10-14 MED ORDER — MENTHOL 3 MG MT LOZG: 1.0000 | LOZENGE | OROMUCOSAL | Status: DC | PRN

## 2020-10-14 MED ORDER — ONDANSETRON HCL 4 MG/2ML IJ SOLN: 4.0000 mg | Freq: Once | INTRAMUSCULAR | Status: DC | PRN

## 2020-10-14 MED ORDER — ACETAMINOPHEN 325 MG PO TABS: 325.0000 mg | ORAL_TABLET | Freq: Four times a day (QID) | ORAL | Status: DC | PRN

## 2020-10-14 MED ORDER — LACTATED RINGERS IV SOLN: INTRAVENOUS | Status: DC | PRN

## 2020-10-14 MED ORDER — CEFAZOLIN SODIUM-DEXTROSE 2-4 GM/100ML-% IV SOLN
2.0000 g | Freq: Four times a day (QID) | INTRAVENOUS | Status: AC
  Administered 2020-10-14 – 2020-10-15 (×2): 2 g via INTRAVENOUS
  Filled 2020-10-14 (×2): qty 100

## 2020-10-14 MED ORDER — ROCURONIUM BROMIDE 100 MG/10ML IV SOLN
INTRAVENOUS | Status: DC | PRN
  Administered 2020-10-14: 70 mg via INTRAVENOUS

## 2020-10-14 MED ORDER — ENOXAPARIN SODIUM 40 MG/0.4ML ~~LOC~~ SOLN
40.0000 mg | SUBCUTANEOUS | Status: DC
  Administered 2020-10-15 – 2020-10-22 (×8): 40 mg via SUBCUTANEOUS
  Filled 2020-10-14 (×8): qty 0.4

## 2020-10-14 MED ORDER — CHLORHEXIDINE GLUCONATE 4 % EX LIQD: 60.0000 mL | Freq: Once | CUTANEOUS | Status: DC

## 2020-10-14 MED ORDER — CHLORHEXIDINE GLUCONATE 0.12 % MT SOLN
OROMUCOSAL | Status: AC
  Administered 2020-10-14: 15 mL via OROMUCOSAL
  Filled 2020-10-14: qty 15

## 2020-10-14 MED ORDER — ENSURE ENLIVE PO LIQD
237.0000 mL | Freq: Two times a day (BID) | ORAL | Status: DC
  Administered 2020-10-15 – 2020-10-17 (×3): 237 mL via ORAL

## 2020-10-14 MED ORDER — PHENYLEPHRINE HCL-NACL 10-0.9 MG/250ML-% IV SOLN
INTRAVENOUS | Status: DC | PRN
  Administered 2020-10-14: 50 ug/min via INTRAVENOUS

## 2020-10-14 MED ORDER — PHENOL 1.4 % MT LIQD: 1.0000 | OROMUCOSAL | Status: DC | PRN

## 2020-10-14 MED ORDER — ADULT MULTIVITAMIN W/MINERALS CH
1.0000 | ORAL_TABLET | Freq: Every day | ORAL | Status: DC
  Administered 2020-10-15 – 2020-10-22 (×8): 1 via ORAL
  Filled 2020-10-14 (×8): qty 1

## 2020-10-14 MED ORDER — CHLORHEXIDINE GLUCONATE 0.12 % MT SOLN: 15.0000 mL | Freq: Once | OROMUCOSAL | Status: AC

## 2020-10-14 MED ORDER — ACETAMINOPHEN 500 MG PO TABS
500.0000 mg | ORAL_TABLET | Freq: Three times a day (TID) | ORAL | Status: DC
  Administered 2020-10-14 – 2020-10-15 (×3): 500 mg via ORAL
  Filled 2020-10-14 (×3): qty 1

## 2020-10-14 MED ORDER — ONDANSETRON HCL 4 MG PO TABS: 4.0000 mg | ORAL_TABLET | Freq: Four times a day (QID) | ORAL | Status: DC | PRN

## 2020-10-14 MED ORDER — FENTANYL CITRATE (PF) 250 MCG/5ML IJ SOLN
INTRAMUSCULAR | Status: AC
  Filled 2020-10-14: qty 5

## 2020-10-14 MED ORDER — EPHEDRINE SULFATE-NACL 50-0.9 MG/10ML-% IV SOSY
PREFILLED_SYRINGE | INTRAVENOUS | Status: DC | PRN
  Administered 2020-10-14: 5 mg via INTRAVENOUS
  Administered 2020-10-14: 10 mg via INTRAVENOUS

## 2020-10-14 MED ORDER — CEFAZOLIN SODIUM-DEXTROSE 2-4 GM/100ML-% IV SOLN
2.0000 g | INTRAVENOUS | Status: AC
  Administered 2020-10-14: 2 g via INTRAVENOUS

## 2020-10-14 MED ORDER — METOCLOPRAMIDE HCL 5 MG/ML IJ SOLN: 5.0000 mg | Freq: Three times a day (TID) | INTRAMUSCULAR | Status: DC | PRN

## 2020-10-14 MED ORDER — 0.9 % SODIUM CHLORIDE (POUR BTL) OPTIME
TOPICAL | Status: DC | PRN
  Administered 2020-10-14: 1000 mL

## 2020-10-14 MED ORDER — INSULIN ASPART 100 UNIT/ML ~~LOC~~ SOLN
0.0000 [IU] | SUBCUTANEOUS | Status: DC
  Administered 2020-10-14 – 2020-10-15 (×2): 2 [IU] via SUBCUTANEOUS
  Administered 2020-10-15 – 2020-10-16 (×3): 1 [IU] via SUBCUTANEOUS
  Administered 2020-10-16: 2 [IU] via SUBCUTANEOUS
  Administered 2020-10-16 – 2020-10-17 (×4): 1 [IU] via SUBCUTANEOUS
  Administered 2020-10-17: 2 [IU] via SUBCUTANEOUS
  Administered 2020-10-17 – 2020-10-21 (×12): 1 [IU] via SUBCUTANEOUS
  Administered 2020-10-21: 2 [IU] via SUBCUTANEOUS
  Administered 2020-10-21 – 2020-10-22 (×6): 1 [IU] via SUBCUTANEOUS

## 2020-10-14 MED ORDER — PROPOFOL 10 MG/ML IV BOLUS
INTRAVENOUS | Status: DC | PRN
  Administered 2020-10-14: 100 mg via INTRAVENOUS

## 2020-10-14 MED ORDER — DOCUSATE SODIUM 100 MG PO CAPS
100.0000 mg | ORAL_CAPSULE | Freq: Two times a day (BID) | ORAL | Status: DC
  Administered 2020-10-15 – 2020-10-21 (×12): 100 mg via ORAL
  Filled 2020-10-14 (×15): qty 1

## 2020-10-14 MED ORDER — ONDANSETRON HCL 4 MG/2ML IJ SOLN
INTRAMUSCULAR | Status: DC | PRN
  Administered 2020-10-14: 4 mg via INTRAVENOUS

## 2020-10-14 MED ORDER — CEFAZOLIN SODIUM-DEXTROSE 2-4 GM/100ML-% IV SOLN
INTRAVENOUS | Status: AC
  Filled 2020-10-14: qty 100

## 2020-10-14 MED ORDER — POLYETHYLENE GLYCOL 3350 17 G PO PACK
17.0000 g | PACK | Freq: Every day | ORAL | Status: DC
  Administered 2020-10-15 – 2020-10-21 (×7): 17 g via ORAL
  Filled 2020-10-14 (×8): qty 1

## 2020-10-14 MED ORDER — CARVEDILOL 25 MG PO TABS
25.0000 mg | ORAL_TABLET | Freq: Two times a day (BID) | ORAL | Status: DC
  Administered 2020-10-14 – 2020-10-22 (×16): 25 mg via ORAL
  Filled 2020-10-14 (×16): qty 1

## 2020-10-14 MED ORDER — FENTANYL CITRATE (PF) 250 MCG/5ML IJ SOLN
INTRAMUSCULAR | Status: DC | PRN
  Administered 2020-10-14 (×2): 25 ug via INTRAVENOUS
  Administered 2020-10-14: 100 ug via INTRAVENOUS

## 2020-10-14 MED ORDER — SUGAMMADEX SODIUM 200 MG/2ML IV SOLN
INTRAVENOUS | Status: DC | PRN
  Administered 2020-10-14: 200 mg via INTRAVENOUS

## 2020-10-14 MED ORDER — ROCURONIUM BROMIDE 10 MG/ML (PF) SYRINGE
PREFILLED_SYRINGE | INTRAVENOUS | Status: AC
  Filled 2020-10-14: qty 10

## 2020-10-14 MED ORDER — FAMOTIDINE 20 MG PO TABS
20.0000 mg | ORAL_TABLET | Freq: Two times a day (BID) | ORAL | Status: DC
  Administered 2020-10-14 – 2020-10-22 (×16): 20 mg via ORAL
  Filled 2020-10-14 (×16): qty 1

## 2020-10-14 MED ORDER — ONDANSETRON HCL 4 MG/2ML IJ SOLN: 4.0000 mg | Freq: Four times a day (QID) | INTRAMUSCULAR | Status: DC | PRN

## 2020-10-14 MED ORDER — LIDOCAINE 2% (20 MG/ML) 5 ML SYRINGE
INTRAMUSCULAR | Status: DC | PRN
  Administered 2020-10-14: 100 mg via INTRAVENOUS

## 2020-10-14 MED ORDER — HYDROMORPHONE HCL 1 MG/ML IJ SOLN: 0.2500 mg | INTRAMUSCULAR | Status: DC | PRN

## 2020-10-14 MED ORDER — SENNOSIDES-DOCUSATE SODIUM 8.6-50 MG PO TABS
2.0000 | ORAL_TABLET | Freq: Two times a day (BID) | ORAL | Status: DC
  Administered 2020-10-14 – 2020-10-22 (×16): 2 via ORAL
  Filled 2020-10-14 (×19): qty 2

## 2020-10-14 MED ORDER — PHENYLEPHRINE 40 MCG/ML (10ML) SYRINGE FOR IV PUSH (FOR BLOOD PRESSURE SUPPORT)
PREFILLED_SYRINGE | INTRAVENOUS | Status: DC | PRN
  Administered 2020-10-14: 80 ug via INTRAVENOUS

## 2020-10-14 SURGICAL SUPPLY — 47 items
BIT DRILL 4.8X200 CANN (BIT) ×2 IMPLANT
BNDG COHESIVE 6X5 TAN STRL LF (GAUZE/BANDAGES/DRESSINGS) ×2 IMPLANT
BNDG ELASTIC 6X5.8 VLCR STR LF (GAUZE/BANDAGES/DRESSINGS) ×2 IMPLANT
BNDG GAUZE ELAST 4 BULKY (GAUZE/BANDAGES/DRESSINGS) IMPLANT
BRUSH SCRUB EZ PLAIN DRY (MISCELLANEOUS) ×6 IMPLANT
COVER PERINEAL POST (MISCELLANEOUS) ×1 IMPLANT
COVER SURGICAL LIGHT HANDLE (MISCELLANEOUS) ×4 IMPLANT
COVER WAND RF STERILE (DRAPES) ×1 IMPLANT
DRAPE C-ARMOR (DRAPES) ×3 IMPLANT
DRAPE STERI IOBAN 125X83 (DRAPES) ×3 IMPLANT
DRSG MEPILEX BORDER 4X4 (GAUZE/BANDAGES/DRESSINGS) ×3 IMPLANT
DRSG MEPITEL 3X4 ME34 (GAUZE/BANDAGES/DRESSINGS) ×4 IMPLANT
ELECT REM PT RETURN 9FT ADLT (ELECTROSURGICAL) ×3
ELECTRODE REM PT RTRN 9FT ADLT (ELECTROSURGICAL) ×1 IMPLANT
GLOVE BIO SURGEON STRL SZ7.5 (GLOVE) ×3 IMPLANT
GLOVE BIO SURGEON STRL SZ8 (GLOVE) ×3 IMPLANT
GLOVE BIOGEL PI IND STRL 7.5 (GLOVE) ×1 IMPLANT
GLOVE BIOGEL PI IND STRL 8 (GLOVE) ×1 IMPLANT
GLOVE BIOGEL PI INDICATOR 7.5 (GLOVE) ×2
GLOVE BIOGEL PI INDICATOR 8 (GLOVE) ×2
GOWN STRL REUS W/ TWL LRG LVL3 (GOWN DISPOSABLE) ×2 IMPLANT
GOWN STRL REUS W/ TWL XL LVL3 (GOWN DISPOSABLE) ×1 IMPLANT
GOWN STRL REUS W/TWL LRG LVL3 (GOWN DISPOSABLE) ×6
GOWN STRL REUS W/TWL XL LVL3 (GOWN DISPOSABLE) ×3
KIT BASIN OR (CUSTOM PROCEDURE TRAY) ×3 IMPLANT
KIT TURNOVER KIT B (KITS) ×3 IMPLANT
MANIFOLD NEPTUNE II (INSTRUMENTS) ×1 IMPLANT
NS IRRIG 1000ML POUR BTL (IV SOLUTION) ×3 IMPLANT
PACK GENERAL/GYN (CUSTOM PROCEDURE TRAY) ×3 IMPLANT
PAD ARMBOARD 7.5X6 YLW CONV (MISCELLANEOUS) ×6 IMPLANT
PIN GUIDE DRILL TIP 2.8X300 (DRILL) ×8 IMPLANT
SCREW CANNULATED 8.0X65 (Screw) ×4 IMPLANT
SCREW CANNULATED 8.0X75MM (Screw) ×2 IMPLANT
STAPLER VISISTAT 35W (STAPLE) ×3 IMPLANT
STOCKINETTE IMPERVIOUS LG (DRAPES) ×2 IMPLANT
SUT ETHILON 2 0 FS 18 (SUTURE) ×2 IMPLANT
SUT VIC AB 0 CT1 27 (SUTURE)
SUT VIC AB 0 CT1 27XBRD ANBCTR (SUTURE) ×1 IMPLANT
SUT VIC AB 1 CT1 27 (SUTURE) ×3
SUT VIC AB 1 CT1 27XBRD ANBCTR (SUTURE) ×1 IMPLANT
SUT VIC AB 2-0 CT1 27 (SUTURE) ×3
SUT VIC AB 2-0 CT1 TAPERPNT 27 (SUTURE) ×1 IMPLANT
TOWEL GREEN STERILE (TOWEL DISPOSABLE) ×6 IMPLANT
TOWEL GREEN STERILE FF (TOWEL DISPOSABLE) ×3 IMPLANT
WASHER 8.0 (Orthopedic Implant) ×2 IMPLANT
WASHER CANN FLAT 8 (Orthopedic Implant) IMPLANT
WATER STERILE IRR 1000ML POUR (IV SOLUTION) ×1 IMPLANT

## 2020-10-14 NOTE — Anesthesia Postprocedure Evaluation (Signed)
Anesthesia Post Note  Patient: Robin Guzman  Procedure(s) Performed: CANNULATED HIP PINNING (Left Hip)     Patient location during evaluation: PACU Anesthesia Type: General Level of consciousness: awake and alert Pain management: pain level controlled Vital Signs Assessment: post-procedure vital signs reviewed and stable Respiratory status: spontaneous breathing, nonlabored ventilation, respiratory function stable and patient connected to nasal cannula oxygen Cardiovascular status: blood pressure returned to baseline and stable Postop Assessment: no apparent nausea or vomiting Anesthetic complications: no   No complications documented.  Last Vitals:  Vitals:   10/14/20 0700 10/14/20 1520  BP: (!) 146/65 (!) 134/57  Pulse: (!) 50 (!) 58  Resp: 17 14  Temp:  (!) 36.3 C  SpO2: 99% 100%    Last Pain:  Vitals:   10/14/20 1520  TempSrc:   PainSc: Asleep                 Liala Codispoti DAVID

## 2020-10-14 NOTE — Progress Notes (Signed)
Patient admitted to room. Alert and confusion. No pain or discomfort. Skin dry and warm to touch. Skin tear to upper left arm,hand, lower leg, and left foot. Bruises noted to chest and both arms, left hand, leg, foot, back, face. Respiration even and non labor. Patient refused incentive spirometer or deep coughing. Ice applied to incision site. Patient's able to feed self at supper with cues  from staff. No combative behavior at this time.

## 2020-10-14 NOTE — ED Notes (Signed)
[facility] aware of bed assignment and need for transport to [facility] [phone])

## 2020-10-14 NOTE — Transfer of Care (Signed)
Immediate Anesthesia Transfer of Care Note  Patient: Robin Guzman  Procedure(s) Performed: CANNULATED HIP PINNING (Left Hip)  Patient Location: PACU  Anesthesia Type:General  Level of Consciousness: drowsy  Airway & Oxygen Therapy: Patient Spontanous Breathing and Patient connected to face mask oxygen  Post-op Assessment: Report given to RN and Post -op Vital signs reviewed and stable  Post vital signs: Reviewed  Last Vitals:  Vitals Value Taken Time  BP    Temp    Pulse    Resp    SpO2      Last Pain:  Vitals:   10/14/20 1520  TempSrc:   PainSc: (P) Asleep         Complications: No complications documented.

## 2020-10-14 NOTE — Consult Note (Signed)
Reason for Consult:Right hip fx Referring Physician: Eligah East Time called: [phone]   Time at bedside: [place] is an 86 y.o. female.  HPI: Jeaninne fell at the SNF where she resides. This was the second fall in a week. She was brought to the ED where x-rays showed a left hip fx. She was transferred to [facility] for definitive fixation. She is demented and cannot contribute to history.  Past Medical History:  Diagnosis Date  . Arthritis   . CAD (coronary artery disease), native coronary artery    07/27/17 nonobstructive, EF 25-35% by LV gram  . CHF (congestive heart failure) ([place])   . Dementia ([place])   . Diabetes mellitus without complication ([place])   . Dyslipidemia   . Dyspnea   . Dyspnea on exertion   . Gallstone 09/2020  . History of stress test 07/05/2006   High risk scan cardiac cathe was recommended.  [name] Hx of echocardiogram 04/09/2011   EF 55% Mildly hypertrophic left ventricle with normal systolic function, Moderate (grade II) diastolic dysfunction. Elevated left atrial pressure. No significant valvular abnormalities. No pericardial effusion. Mild to moderate pulmonary arterial hypertension.  . Hypercholesteremia   . Hypertension   . Myocardial infarct ([place])   . Skin cancer    HISTORY OF     Past Surgical History:  Procedure Laterality Date  . ABDOMINAL HYSTERECTOMY    . CATARACT EXTRACTION W/PHACO Right 05/20/2017   Procedure: CATARACT EXTRACTION PHACO AND INTRAOCULAR LENS PLACEMENT RIGHT EYE;  Surgeon: Tonny Branch, MD;  Location: [facility];  Service: Ophthalmology;  Laterality: Right;  CDE: 17.77  . CATARACT EXTRACTION W/PHACO Left 06/21/2017   Procedure: CATARACT EXTRACTION PHACO AND INTRAOCULAR LENS PLACEMENT (IOC);  Surgeon: Tonny Branch, MD;  Location: [facility];  Service: Ophthalmology;  Laterality: Left;  CDE: 8.90  . ERCP N/A 10/03/2020   Procedure: ENDOSCOPIC RETROGRADE CHOLANGIOPANCREATOGRAPHY (ERCP);  Surgeon: Arta Silence, MD;  Location: [facility] ENDOSCOPY;  Service:  Endoscopy;  Laterality: N/A;  . LEFT HEART CATH AND CORONARY ANGIOGRAPHY N/A 07/27/2017   Procedure: LEFT HEART CATH AND CORONARY ANGIOGRAPHY;  Surgeon: Wellington Hampshire, MD;  Location: [place] CV LAB;  Service: Cardiovascular;  Laterality: N/A;  . REMOVAL OF STONES  10/03/2020   Procedure: REMOVAL OF STONES;  Surgeon: Arta Silence, MD;  Location: [facility] ENDOSCOPY;  Service: Endoscopy;;  . SPHINCTEROTOMY  10/03/2020   Procedure: SPHINCTEROTOMY;  Surgeon: Arta Silence, MD;  Location: [facility] ENDOSCOPY;  Service: Endoscopy;;    Family History  Problem Relation Age of Onset  . Cancer Mother   . Heart Problems Father   . Cancer Brother        brain  . Cancer Sister        brain    Social History:  reports that she has never smoked. She has never used smokeless tobacco. She reports that she does not drink alcohol and does not use drugs.  Allergies:  Allergies  Allergen Reactions  . Daypro [Oxaprozin] Shortness Of Breath and Other (See Comments)    weakness  . Advil [Ibuprofen] Other (See Comments)    weakness    Medications: I have reviewed the patient's current medications.  Results for orders placed or performed during the hospital encounter of 10/13/20 (from the past 48 hour(s))  CBC     Status: Abnormal   Collection Time: 10/13/20  5:44 PM  Result Value Ref Range   WBC 10.7 (H) 4.0 - 10.5 K/uL   RBC 3.17 (L) 3.87 -  5.11 MIL/uL   Hemoglobin 9.8 (L) 12.0 - 15.0 g/dL   HCT 32.5 (L) 36 - 46 %   MCV 102.5 (H) 80.0 - 100.0 fL   MCH 30.9 26.0 - 34.0 pg   MCHC 30.2 30.0 - 36.0 g/dL   RDW 23.4 (H) 11.5 - 15.5 %   Platelets 204 150 - 400 K/uL   nRBC 0.0 0.0 - 0.2 %    Comment: Performed at [facility], [address] [place], [place] [address]  Basic metabolic panel     Status: Abnormal   Collection Time: 10/13/20  5:44 PM  Result Value Ref Range   Sodium 137 135 - 145 mmol/L   Potassium 4.0 3.5 - 5.1 mmol/L   Chloride 106 98 - 111 mmol/L   CO2 23 22 - 32 mmol/L   Glucose,  Bld 160 (H) 70 - 99 mg/dL    Comment: Glucose reference range applies only to samples taken after fasting for at least 8 hours.   BUN 13 8 - 23 mg/dL   Creatinine, Ser 0.96 0.44 - 1.00 mg/dL   Calcium 9.0 8.9 - 10.3 mg/dL   GFR, Estimated 56 (L) >60 mL/min    Comment: (NOTE) Calculated using the CKD-EPI Creatinine Equation (2021)    Anion gap 8 5 - 15    Comment: Performed at [facility], [address] [place], [place] [address]  Hepatic function panel     Status: Abnormal   Collection Time: 10/13/20  5:44 PM  Result Value Ref Range   Total Protein 5.9 (L) 6.5 - 8.1 g/dL   Albumin 2.9 (L) 3.5 - 5.0 g/dL   AST 49 (H) 15 - 41 U/L   ALT 27 0 - 44 U/L   Alkaline Phosphatase 303 (H) 38 - 126 U/L   Total Bilirubin 3.6 (H) 0.3 - 1.2 mg/dL   Bilirubin, Direct 1.6 (H) 0.0 - 0.2 mg/dL   Indirect Bilirubin 2.0 (H) 0.3 - 0.9 mg/dL    Comment: Performed at [facility], [address] [place], [place] [address]  Lipase, blood     Status: None   Collection Time: 10/13/20  5:44 PM  Result Value Ref Range   Lipase 27 11 - 51 U/L    Comment: Performed at [facility], [address] [place],  [address]  Resp Panel by RT-PCR (Flu A&B, Covid) Nasopharyngeal Swab     Status: None   Collection Time: 10/13/20 10:31 PM   Specimen: Nasopharyngeal Swab; Nasopharyngeal(NP) swabs in vial transport medium  Result Value Ref Range   SARS Coronavirus 2 by RT PCR NEGATIVE NEGATIVE    Comment: (NOTE) SARS-CoV-2 target nucleic acids are NOT DETECTED.  The SARS-CoV-2 RNA is generally detectable in upper respiratory specimens during the acute phase of infection. The lowest concentration of SARS-CoV-2 viral copies this assay can detect is 138 copies/mL. A negative result does not preclude SARS-Cov-2 infection and should not be used as the sole basis for treatment or other patient management decisions. A negative result may occur with  improper specimen collection/handling, submission of specimen  other than nasopharyngeal swab, presence of viral mutation(s) within the areas targeted by this assay, and inadequate number of viral copies(<138 copies/mL). A negative result must be combined with clinical observations, patient history, and epidemiological information. The expected result is Negative.  Fact Sheet for Patients:  EntrepreneurPulse.com.au  Fact Sheet for Healthcare Providers:  IncredibleEmployment.be  This test is no t yet approved or cleared by the [place] and  has been authorized for detection and/or diagnosis of SARS-CoV-2 by FDA under an Emergency Use Authorization (EUA). This EUA will remain  in effect (meaning this test can be used) for the duration of the COVID-19 declaration under Section 564(b)(1) of the Act, 21 U.S.C.section 360bbb-3(b)(1), unless the authorization is terminated  or revoked sooner.       Influenza A by PCR NEGATIVE NEGATIVE   Influenza B by PCR NEGATIVE NEGATIVE    Comment: (NOTE) The Xpert Xpress SARS-CoV-2/FLU/RSV plus assay is intended as an aid in the diagnosis of influenza from Nasopharyngeal swab specimens and should not be used as a sole basis for treatment. Nasal washings and aspirates are unacceptable for Xpert Xpress SARS-CoV-2/FLU/RSV testing.  Fact Sheet for Patients: EntrepreneurPulse.com.au  Fact Sheet for Healthcare Providers: IncredibleEmployment.be  This test is not yet approved or cleared by the [place] FDA and has been authorized for detection and/or diagnosis of SARS-CoV-2 by FDA under an Emergency Use Authorization (EUA). This EUA will remain in effect (meaning this test can be used) for the duration of the COVID-19 declaration under Section 564(b)(1) of the Act, 21 U.S.C. section 360bbb-3(b)(1), unless the authorization is terminated or revoked.  Performed at [facility], [address] [place], [place] [address]   CBG  monitoring, ED     Status: Abnormal   Collection Time: 10/14/20  9:29 AM  Result Value Ref Range   Glucose-Capillary 106 (H) 70 - 99 mg/dL    Comment: Glucose reference range applies only to samples taken after fasting for at least 8 hours.    CT ABDOMEN PELVIS WO CONTRAST  Result Date: 10/13/2020 CLINICAL DATA:  Abdominal trauma, jaundice. EXAM: CT ABDOMEN AND PELVIS WITHOUT CONTRAST TECHNIQUE: Multidetector CT imaging of the abdomen and pelvis was performed following the standard protocol without IV contrast. COMPARISON:  10/01/2020 FINDINGS: Lower chest: Coronary and aortic atherosclerosis. Mild cardiomegaly. Hepatobiliary: Gallstones in the gallbladder. No well-defined or definite residual biliary dilatation. Pancreas: Unremarkable Spleen: Unremarkable Adrenals/Urinary Tract: Bilateral renal cysts of varying complexity are unchanged. 1.4 by 1.4 cm myelolipoma of the lateral limb of the right adrenal gland. Stomach/Bowel: Scattered colonic diverticula. There a few diverticula of the terminal ileum is well. Sigmoid colon diverticulosis without active diverticulitis. Vascular/Lymphatic: Aortoiliac atherosclerotic vascular disease. No pathologic adenopathy is identified. Reproductive: Uterus absent. Stable 9.9 by 6.7 cm fluid density structure along the vaginal cuff/left adnexa. Other: Stable trace perihepatic ascites. Increased flank edema bilaterally and increased edema lateral to the hips in the subcutaneous tissues, and tracking into the right upper thigh. Musculoskeletal: Healing fractures of the left lower fifth and sixth ribs. Subacute subcapital fracture of the left femoral neck, without current volume loss or avascular necrosis of the femoral head. 60% compression fracture at L1 fracture plane along the inferior endplate and about 4 mm of posterior bony retropulsion, not appreciably changed from 10/01/2020. Lower thoracic spondylosis with bridging fusion. Grade 1 degenerative anterolisthesis at  L4-5. IMPRESSION: 1. Subacute subcapital fracture of the left femoral neck, without current volume loss or avascular necrosis of the femoral head. 2. Healing fractures of the left lower fifth and sixth ribs. 3. 60% compression fracture at L1, not appreciably changed from 10/01/2020. 4. Other imaging findings of potential clinical significance: Coronary atherosclerosis. Mild cardiomegaly. Bilateral renal cysts of varying complexity are unchanged. Sigmoid colon diverticulosis. Small myelolipoma of the right adrenal gland. Increased edema lateral to the hips in the subcutaneous tissues, and tracking into the right upper thigh. Stable trace perihepatic ascites. Cholelithiasis. Stable simple  appearing large fluid collection along the vaginal cuff/left adnexa. 5. Aortic atherosclerosis. Aortic Atherosclerosis (ICD10-I70.0). Electronically Signed   By: Van Clines M.D.   On: 10/13/2020 20:30   CT Head Wo Contrast  Result Date: 10/13/2020 CLINICAL DATA:  Confusion, jaundice, unexplained skin tear on the left upper arm EXAM: CT HEAD WITHOUT CONTRAST TECHNIQUE: Contiguous axial images were obtained from the base of the skull through the vertex without intravenous contrast. COMPARISON:  CT head 10/13/2020 FINDINGS: Brain: The brainstem, cerebellum, cerebral peduncles, thalami, basal ganglia, basilar cisterns, and ventricular system appear within normal limits. Periventricular white matter and corona radiata hypodensities favor chronic ischemic microvascular white matter disease. No intracranial hemorrhage, mass lesion, or acute CVA. Vascular: There is atherosclerotic calcification of the cavernous carotid arteries bilaterally. Skull: Unremarkable Sinuses/Orbits: Mucous retention cyst in the right maxillary sinus. Chronic right sphenoid sinusitis. Other: No supplemental non-categorized findings. IMPRESSION: 1. No acute intracranial findings. 2. Periventricular white matter and corona radiata hypodensities favor  chronic ischemic microvascular white matter disease. 3. Mucous retention cyst in the right maxillary sinus. Chronic right sphenoid sinusitis. Electronically Signed   By: Van Clines M.D.   On: 10/13/2020 20:13   Chest Portable 1 View  Result Date: 10/13/2020 CLINICAL DATA:  Preop left hip fracture EXAM: PORTABLE CHEST 1 VIEW COMPARISON:  06/22/2020 FINDINGS: The heart size and mediastinal contours are within normal limits. Both lungs are clear. The visualized skeletal structures are unremarkable. IMPRESSION: No active disease. Electronically Signed   By: Ulyses Jarred M.D.   On: 10/13/2020 23:37   DG Humerus Left  Result Date: 10/13/2020 CLINICAL DATA:  Bruising and pain along the left arm EXAM: LEFT HUMERUS - 2+ VIEW COMPARISON:  Radiographs from 10/10/2020 FINDINGS: Deformity of the proximal humeral shaft related to a remote prior surgical neck fracture (originally shown on 11/18/2016). No new fracture is identified. No acute bony findings or abnormal gas tracking in the soft tissues. The focal soft tissue swelling overlying the mid humerus laterally is reduced compared to the prior exam. IMPRESSION: 1. Soft tissue swelling overlying the mid humerus laterally is reduced compared to the prior exam. No acute bony findings. 2. Deformity from old healed proximal humeral fracture. Electronically Signed   By: Van Clines M.D.   On: 10/13/2020 20:15    Review of Systems  Unable to perform ROS: Dementia   Blood pressure (!) 146/65, pulse (!) 50, temperature 98 F (36.7 C), temperature source Oral, resp. rate 17, height 5\' 4"  (1.626 m), weight 66 kg, SpO2 99 %. Physical Exam Constitutional:      General: She is not in acute distress.    Appearance: She is well-developed. She is not diaphoretic.  HENT:     Head: Normocephalic and atraumatic.  Eyes:     General: No scleral icterus.       Right eye: No discharge.        Left eye: No discharge.     Conjunctiva/sclera: Conjunctivae  normal.  Cardiovascular:     Rate and Rhythm: Normal rate and regular rhythm.  Pulmonary:     Effort: Pulmonary effort is normal. No respiratory distress.  Musculoskeletal:     Cervical back: Normal range of motion.     Comments: RLE No traumatic wounds, ecchymosis, or rash  No knee or ankle effusion  Knee stable to varus/ valgus and anterior/posterior stress  Sens DPN, SPN, TN could not assess  Motor EHL, ext, flex, evers could not assess  DP 1+, PT 2+, No significant  edema  Skin:    General: Skin is warm and dry.  Neurological:     Mental Status: She is alert.  Psychiatric:        Behavior: Behavior normal.     Assessment/Plan: Left hip fx -- Plan cannulate hip pinning today by Dr. Marcelino Scot.  Please keep NPO. Multiple medical problems including osteoarthritis, CAD, history of MI, chronic diastolic heart failure, dementia, type 2 diabetes, hyperlipidemia, dyspnea on exertion, hypertension, and history of skin cancer -- per primary service    Lisette Abu, PA-C Orthopedic Surgery [phone] 10/14/2020, 11:09 AM

## 2020-10-14 NOTE — ED Notes (Signed)
Pt was attempting to get out of bed, repeating that she needed to use the bathroom; purewick placed on pt and pt encouraged to urinate; pt acting out slapping at staff and biting; verbal order for foley, foley placed; pt bed linens changed and pt repositioned

## 2020-10-14 NOTE — Plan of Care (Signed)
°  Problem: Education: Goal: Knowledge of General Education information will improve Description: Including pain rating scale, medication(s)/side effects and non-pharmacologic comfort measures Outcome: Progressing   Problem: Health Behavior/Discharge Planning: Goal: Ability to manage health-related needs will improve Outcome: Progressing   Problem: Clinical Measurements: Goal: Ability to maintain clinical measurements within normal limits will improve Outcome: Progressing Goal: Will remain free from infection Outcome: Progressing Goal: Diagnostic test results will improve Outcome: Progressing Goal: Respiratory complications will improve Outcome: Progressing Goal: Cardiovascular complication will be avoided Outcome: Progressing   Problem: Activity: Goal: Risk for activity intolerance will decrease Outcome: Progressing   Problem: Nutrition: Goal: Adequate nutrition will be maintained Outcome: Progressing   Problem: Activity: Goal: Risk for activity intolerance will decrease Outcome: Progressing   Problem: Nutrition: Goal: Adequate nutrition will be maintained Outcome: Progressing   Problem: Coping: Goal: Level of anxiety will decrease Outcome: Progressing   Problem: Safety: Goal: Ability to remain free from injury will improve Outcome: Progressing   Problem: Skin Integrity: Goal: Risk for impaired skin integrity will decrease Outcome: Progressing

## 2020-10-14 NOTE — Anesthesia Preprocedure Evaluation (Signed)
Anesthesia Evaluation  Patient identified by MRN, date of birth, ID band Patient awake    Reviewed: Allergy & Precautions, NPO status , Patient's Chart, lab work & pertinent test results  Airway Mallampati: I  TM Distance: >3 FB Neck ROM: Full    Dental   Pulmonary    Pulmonary exam normal        Cardiovascular hypertension, Pt. on medications + CAD, + Past MI and +CHF  Normal cardiovascular exam     Neuro/Psych Dementia    GI/Hepatic   Endo/Other  diabetes, Type 2  Renal/GU      Musculoskeletal   Abdominal   Peds  Hematology   Anesthesia Other Findings   Reproductive/Obstetrics                             Anesthesia Physical Anesthesia Plan  ASA: III  Anesthesia Plan: General   Post-op Pain Management:    Induction: Intravenous  PONV Risk Score and Plan: 3 and Ondansetron and Treatment may vary due to age or medical condition  Airway Management Planned: Oral ETT  Additional Equipment:   Intra-op Plan:   Post-operative Plan: Extubation in OR  Informed Consent: I have reviewed the patients History and Physical, chart, labs and discussed the procedure including the risks, benefits and alternatives for the proposed anesthesia with the patient or authorized representative who has indicated his/her understanding and acceptance.       Plan Discussed with: CRNA and Surgeon  Anesthesia Plan Comments:         Anesthesia Quick Evaluation

## 2020-10-14 NOTE — Op Note (Signed)
OPERATIVE REPORT 10/14/2020  3:32 PM  PATIENT:  Robin Guzman  86 y.o. female  PRE-OPERATIVE DIAGNOSIS:  1. IMPACTED LEFT FEMORAL NECK FRACTURE   2. LEFT LEG SKIN TEAR  POST-OPERATIVE DIAGNOSIS:   1. IMPACTED LEFT FEMORAL NECK FRACTURE   2. LEFT LEG SKIN TEAR  PROCEDURE:  1. CANNULATED SCREW FIXATION OF LEFT FEMORAL NECK FRACTURE WITH 8.0 MM BIOMET CANNULATED SCREWS WITH STAR DRIVE HEADS 2. DRESSING CHANGE UNDER ANESTHESIA FOR LEFT LEG SKIN TEAR  SURGEON:  Surgeon(s) and Role:    * Altamese [place], MD - Primary  PHYSICIAN ASSISTANT: PA Student  ANESTHESIA:   general  I/O:  Total I/O In: 1400 [I.V.:1300; IV Piggyback:100] Out: 225 [Urine:175; Blood:50]  SPECIMEN:  No Specimen  TOURNIQUET: NONE  COMPLICATIONS: NONE  DICTATION: Note written in EPIC  DISPOSITION: TO PACU  CONDITION: STABLE  DELAY START OF DVT PROPHYLAXIS BECAUSE OF BLEEDING RISK: NO   BRIEF SUMMARY OF INDICATIONS FOR PROCEDURE:  Robin Guzman is a very pleasant 86 y.o. who sustained a ground level fall resulting in hip pain, inability to bear weight. Subsequent x-rays demonstrated a comminuted valgus impacted femoral neck fracture.  We did discuss with daughter the risks and benefits of surgical fixation including the possibility of avascular necrosis, nonunion, malunion, loss of fixation, need for conversion to total hip arthroplasty or other surgery, DVT, PE, heart attack, stroke, loss of motion, and multiple others including infection, and symptomatic hardware.  After full discussion of these risks and others, consent was provided to proceed.  DESCRIPTION OF PROCEDURE:  The patient was taken to the operating room where general anesthesia was induced.  Preoperative antibiotics consisting of Ancef were administered. The patient was very carefully positioned on the radiolucent table with a bump under the pelvis on the side of the fracture. C-arm was brought in to  confirm appropriate images and reduction. Standard prep and drape were then performed using chlorhexidine scrub, followed by Betadine scrub and paint.  C-arm was brought in to confirm the appropriate starting position for a 4 cm incision and checked on both AP and lateral planes. The incision was made.  Dissection carried carefully down to the tensor Which was split in line to expose the vastus lateralis.  The guide pin for these screws was then placed inferior and central and advanced along the calcar into the femoral head checking it on 2 views. Two additional guide pins were then placed superior to this, one superior anterior and the other superior posterior. Began with the inferior screw, drilling the lateral cortex, then advancing the threads across the fracture site and engaging it, checking under C-arm for compression.  I did use a washer, and additionally 2 screws with washers were placed proximally achieving excellent fixation with inverted triangle pattern across the femoral neck into the head.  There was outstanding bite in the femoral head as well.  Wound was irrigated thoroughly.  I did split and spread the tensor and was able to repair this back with a figure of eight #1 Vicryl suture.  The deep subcu was repaired with #1 Vicryl and then a 2-0 Vicryl and a 2-0 nylon horizontal mattress for the skin layer.  Sterile gently compressive dressing was applied.  The patient was awakened from anesthesia and transported to the PACU in stable condition.  Robin Spinner, PA-C, assisted me throughout with retraction for exposure and instrumentation, as well as for closure.   A dressing change under anesthesia was also performed for the left leg  skin tear using mepitel and gauze with ACE.  PROGNOSIS:  Patient will be partial weightbearing on the left lower extremity with walker and will be allowed to proceed with discharge to facility/ home as soon cleared by PT.  Anticipate being on  DVT prophylaxis with Lovenox while in hospital.  Would anticipate some form of bicycle use within a couple of weeks.  She is at risk for nonunion and avascular necrosis with the potential for subsequent total hip arthroplasty conversion.  The family is aware of this.     Astrid Divine. Marcelino Scot, M.D.

## 2020-10-14 NOTE — OR Nursing (Addendum)
Pt is awake,alert and oriented to self which is baseline, hx of dementia .Pt and/or family verbalized understanding of poc and discharge instructions. Reviewed admission and on going care with receiving RN. Pt is in NAD at this time and is ready to be transferred to floor. Will con't to monitor until pt is transferred. Belongings on bed with patient Pt on Monitor

## 2020-10-14 NOTE — Anesthesia Procedure Notes (Signed)
Procedure Name: Intubation Date/Time: 10/14/2020 1:21 PM Performed by: Janene Harvey, CRNA Pre-anesthesia Checklist: Patient identified, Emergency Drugs available, Suction available and Patient being monitored Patient Re-evaluated:Patient Re-evaluated prior to induction Oxygen Delivery Method: Circle system utilized Preoxygenation: Pre-oxygenation with 100% oxygen Induction Type: IV induction Ventilation: Mask ventilation without difficulty Laryngoscope Size: Mac and 4 Grade View: Grade I Tube type: Oral Tube size: 7.0 mm Number of attempts: 1 Airway Equipment and Method: Stylet and Oral airway Placement Confirmation: ETT inserted through vocal cords under direct vision,  positive ETCO2 and breath sounds checked- equal and bilateral Secured at: 20 cm Tube secured with: Tape Dental Injury: Teeth and Oropharynx as per pre-operative assessment

## 2020-10-14 NOTE — Progress Notes (Signed)
Initial Nutrition Assessment  DOCUMENTATION CODES:   Not applicable  INTERVENTION:   -Once det is advanced, add:   -Ensure Enlive po BID, each supplement provides 350 kcal and 20 grams of protein -MVI with minerals daily  NUTRITION DIAGNOSIS:   Increased nutrient needs related to post-op healing as evidenced by estimated needs.  GOAL:   Patient will meet greater than or equal to 90% of their needs  MONITOR:   PO intake, Supplement acceptance, Diet advancement, Labs, Weight trends, Skin, I & O's  REASON FOR ASSESSMENT:   Consult Assessment of nutrition requirement/status, Hip fracture protocol  ASSESSMENT:   Robin Guzman is a 86 y.o. female with medical history significant of osteoarthritis, CAD, history of MI, chronic diastolic heart failure, dementia, type 2 diabetes, hyperlipidemia, dyspnea on exertion, hypertension, history of skin cancer, history of cholelithiasis with acute cholestatic hepatitis admitted from November 10 November 15 and underwent ERCP and removal of her CBD stone who is coming from the nursing home due to having a fall at the facility.  Pt admitted with lt hip fracture s/p fall.    Per orthopedics notes, plan hip pinning today.   Pt unavailable at time of attempted contact. Per chart review, pt with dementia and unable to provide reliable history. Pt was residing in a SNF PTA.   Reviewed wt hx; wt has been stable over the past year.   Medications reviewed and include ativan, magnesium oxide, miralax, and senokot.   Lab Results  Component Value Date   HGBA1C 5.1 10/01/2020   PTA DM medications are 500 mg metformin BID and 0-6 units inuslin aspart TID with meals.   Labs reviewed: CBGS: 106 (inpatient orders for glycemic control are 0-9 units insulin aspart TID with meals).    Diet Order:   Diet Order            Diet NPO time specified  Diet effective now                 EDUCATION NEEDS:   No education needs have been identified at  this time  Skin:  Skin Assessment: Skin Integrity Issues: Skin Integrity Issues:: Other (Comment) Other: skin tear lt leg  Last BM:  Unknown  Height:   Ht Readings from Last 1 Encounters:  10/13/20 5\' 4"  (1.626 m)    Weight:   Wt Readings from Last 1 Encounters:  10/13/20 66 kg    Ideal Body Weight:  54.5 kg  BMI:  Body mass index is 24.98 kg/m.  Estimated Nutritional Needs:   Kcal:  1650-1850  Protein:  80-95 grams  Fluid:  > 1.6 L    Loistine Chance, RD, LDN, [place] Registered Dietitian II Certified Diabetes Care and Education Specialist Please refer to [facility] for RD and/or RD on-call/weekend/after hours pager

## 2020-10-14 NOTE — Progress Notes (Signed)
PROGRESS NOTE    Robin Guzman  OVZ:[id number] DOB: Apr 19, 1928 DOA: 10/13/2020 PCP: Celene Squibb, MD   Brief Narrative:  Per HPI: Robin Guzman is a 86 y.o. female with medical history significant of osteoarthritis, CAD, history of MI, chronic diastolic heart failure, dementia, type 2 diabetes, hyperlipidemia, dyspnea on exertion, hypertension, history of skin cancer, history of cholelithiasis with acute cholestatic hepatitis admitted from November 10 November 15 and underwent ERCP and removal of her CBD stone who is coming from the nursing home due to having a fall at the facility.  The patient had a recent fall on November 19 and was seen in the emergency department.  She is currently sedated, has history of dementia and is unable to provide further history.  -Patient has been admitted with closed left hip fracture with plans to transfer to [facility] for orthopedic evaluation and management.  Assessment & Plan:   Principal Problem:   Closed left hip fracture, initial encounter [facility]) Active Problems:   Chronic diastolic heart failure (HCC)   Essential hypertension   Controlled type 2 diabetes mellitus without complication, without long-term current use of insulin (HCC)   Hypercholesteremia   CAD (coronary artery disease)   PAT (paroxysmal atrial tachycardia) (HCC)   Macrocytic anemia   Choledocholithiasis   Moderate protein malnutrition (HCC)   Closed left hip fracture -Transfer to [facility] for further orthopedic evaluation -Pain management -Buck's traction -PT/OT evaluation eventually  Chronic diastolic heart failure -Without signs of decompensation -Continue beta-blocker -Avoid diuretics while n.p.o.  Essential hypertension-stable -Continue on carvedilol  Controlled type 2 diabetes -Continue CBG monitoring every 4 hours while n.p.o. -Hold Metformin  CAD/dyslipidemia -No longer on atorvastatin -Continue carvedilol -Currently off aspirin  Paroxysmal  atrial tachycardia -Monitor on telemetry continue carvedilol for heart rate control  Macrocytic anemia -Monitor H&H with repeat CBC  Choledocholithiasis -LFTs downtrending status post ERCP  Moderate protein calorie malnutrition -Consulted dietitian  DVT prophylaxis: Heparin Code Status: DNR Family Communication: Spoke with daughter on phone 11/23 Disposition Plan:  Status is: Inpatient  Remains inpatient appropriate because:Altered mental status, Unsafe d/c plan, IV treatments appropriate due to intensity of illness or inability to take PO and Inpatient level of care appropriate due to severity of illness   Dispo: The patient is from: SNF              Anticipated d/c is to: SNF              Anticipated d/c date is: 2 days              Patient currently is not medically stable to d/c.  Patient needs to have left hip fracture addressed.   Consultants:   Orthopedics  Procedures:   See below  Antimicrobials:   None   Subjective: Patient seen and evaluated today with no new acute complaints or concerns. No acute concerns or events noted overnight.  Objective: Vitals:   10/14/20 0522 10/14/20 0530 10/14/20 0600 10/14/20 0630  BP: (!) 105/58 123/62 115/60 132/61  Pulse: (!) 49 (!) 48 (!) 50 (!) 52  Resp:  16 16   Temp:      TempSrc:      SpO2: 98% 98% 98% 100%  Weight:      Height:       No intake or output data in the 24 hours ending 10/14/20 0656 Filed Weights   10/13/20 1646  Weight: 66 kg    Examination:  General exam: Appears calm  and comfortable, somnolent but arousable Respiratory system: Clear to auscultation. Respiratory effort normal. Cardiovascular system: S1 & S2 heard, RRR.  Gastrointestinal system: Abdomen is nondistended, soft and nontender.  Central nervous system: Alert and awake Extremities: No significant edema, left lower extremity wrapped in dressing Skin: Skin bruising throughout Psychiatry: Cannot be adequately assessed    Data  Reviewed: I have personally reviewed following labs and imaging studies  CBC: Recent Labs  Lab 10/13/20 1744  WBC 10.7*  HGB 9.8*  HCT 32.5*  MCV 102.5*  PLT 481   Basic Metabolic Panel: Recent Labs  Lab 10/13/20 1744  NA 137  K 4.0  CL 106  CO2 23  GLUCOSE 160*  BUN 13  CREATININE 0.96  CALCIUM 9.0   GFR: Estimated Creatinine Clearance: 36.4 mL/min (by C-G formula based on SCr of 0.96 mg/dL). Liver Function Tests: Recent Labs  Lab 10/13/20 1744  AST 49*  ALT 27  ALKPHOS 303*  BILITOT 3.6*  PROT 5.9*  ALBUMIN 2.9*   Recent Labs  Lab 10/13/20 1744  LIPASE 27   No results for input(s): AMMONIA in the last 168 hours. Coagulation Profile: No results for input(s): INR, PROTIME in the last 168 hours. Cardiac Enzymes: No results for input(s): CKTOTAL, CKMB, CKMBINDEX, TROPONINI in the last 168 hours. BNP (last 3 results) No results for input(s): PROBNP in the last 8760 hours. HbA1C: No results for input(s): HGBA1C in the last 72 hours. CBG: No results for input(s): GLUCAP in the last 168 hours. Lipid Profile: No results for input(s): CHOL, HDL, LDLCALC, TRIG, CHOLHDL, LDLDIRECT in the last 72 hours. Thyroid Function Tests: No results for input(s): TSH, T4TOTAL, FREET4, T3FREE, THYROIDAB in the last 72 hours. Anemia Panel: No results for input(s): VITAMINB12, FOLATE, FERRITIN, TIBC, IRON, RETICCTPCT in the last 72 hours. Sepsis Labs: No results for input(s): PROCALCITON, LATICACIDVEN in the last 168 hours.  Recent Results (from the past 240 hour(s))  Resp Panel by RT-PCR (Flu A&B, Covid) Nasopharyngeal Swab     Status: None   Collection Time: 10/13/20 10:31 PM   Specimen: Nasopharyngeal Swab; Nasopharyngeal(NP) swabs in vial transport medium  Result Value Ref Range Status   SARS Coronavirus 2 by RT PCR NEGATIVE NEGATIVE Final    Comment: (NOTE) SARS-CoV-2 target nucleic acids are NOT DETECTED.  The SARS-CoV-2 RNA is generally detectable in upper  respiratory specimens during the acute phase of infection. The lowest concentration of SARS-CoV-2 viral copies this assay can detect is 138 copies/mL. A negative result does not preclude SARS-Cov-2 infection and should not be used as the sole basis for treatment or other patient management decisions. A negative result may occur with  improper specimen collection/handling, submission of specimen other than nasopharyngeal swab, presence of viral mutation(s) within the areas targeted by this assay, and inadequate number of viral copies(<138 copies/mL). A negative result must be combined with clinical observations, patient history, and epidemiological information. The expected result is Negative.  Fact Sheet for Patients:  EntrepreneurPulse.com.au  Fact Sheet for Healthcare Providers:  IncredibleEmployment.be  This test is no t yet approved or cleared by the [place] FDA and  has been authorized for detection and/or diagnosis of SARS-CoV-2 by FDA under an Emergency Use Authorization (EUA). This EUA will remain  in effect (meaning this test can be used) for the duration of the COVID-19 declaration under Section 564(b)(1) of the Act, 21 U.S.C.section 360bbb-3(b)(1), unless the authorization is terminated  or revoked sooner.       Influenza A by  PCR NEGATIVE NEGATIVE Final   Influenza B by PCR NEGATIVE NEGATIVE Final    Comment: (NOTE) The Xpert Xpress SARS-CoV-2/FLU/RSV plus assay is intended as an aid in the diagnosis of influenza from Nasopharyngeal swab specimens and should not be used as a sole basis for treatment. Nasal washings and aspirates are unacceptable for Xpert Xpress SARS-CoV-2/FLU/RSV testing.  Fact Sheet for Patients: EntrepreneurPulse.com.au  Fact Sheet for Healthcare Providers: IncredibleEmployment.be  This test is not yet approved or cleared by the [place] FDA and has been  authorized for detection and/or diagnosis of SARS-CoV-2 by FDA under an Emergency Use Authorization (EUA). This EUA will remain in effect (meaning this test can be used) for the duration of the COVID-19 declaration under Section 564(b)(1) of the Act, 21 U.S.C. section 360bbb-3(b)(1), unless the authorization is terminated or revoked.  Performed at [facility], [address], [place], [place] [address]          Radiology Studies: CT ABDOMEN PELVIS WO CONTRAST  Result Date: 10/13/2020 CLINICAL DATA:  Abdominal trauma, jaundice. EXAM: CT ABDOMEN AND PELVIS WITHOUT CONTRAST TECHNIQUE: Multidetector CT imaging of the abdomen and pelvis was performed following the standard protocol without IV contrast. COMPARISON:  10/01/2020 FINDINGS: Lower chest: Coronary and aortic atherosclerosis. Mild cardiomegaly. Hepatobiliary: Gallstones in the gallbladder. No well-defined or definite residual biliary dilatation. Pancreas: Unremarkable Spleen: Unremarkable Adrenals/Urinary Tract: Bilateral renal cysts of varying complexity are unchanged. 1.4 by 1.4 cm myelolipoma of the lateral limb of the right adrenal gland. Stomach/Bowel: Scattered colonic diverticula. There a few diverticula of the terminal ileum is well. Sigmoid colon diverticulosis without active diverticulitis. Vascular/Lymphatic: Aortoiliac atherosclerotic vascular disease. No pathologic adenopathy is identified. Reproductive: Uterus absent. Stable 9.9 by 6.7 cm fluid density structure along the vaginal cuff/left adnexa. Other: Stable trace perihepatic ascites. Increased flank edema bilaterally and increased edema lateral to the hips in the subcutaneous tissues, and tracking into the right upper thigh. Musculoskeletal: Healing fractures of the left lower fifth and sixth ribs. Subacute subcapital fracture of the left femoral neck, without current volume loss or avascular necrosis of the femoral head. 60% compression fracture at L1 fracture plane along  the inferior endplate and about 4 mm of posterior bony retropulsion, not appreciably changed from 10/01/2020. Lower thoracic spondylosis with bridging fusion. Grade 1 degenerative anterolisthesis at L4-5. IMPRESSION: 1. Subacute subcapital fracture of the left femoral neck, without current volume loss or avascular necrosis of the femoral head. 2. Healing fractures of the left lower fifth and sixth ribs. 3. 60% compression fracture at L1, not appreciably changed from 10/01/2020. 4. Other imaging findings of potential clinical significance: Coronary atherosclerosis. Mild cardiomegaly. Bilateral renal cysts of varying complexity are unchanged. Sigmoid colon diverticulosis. Small myelolipoma of the right adrenal gland. Increased edema lateral to the hips in the subcutaneous tissues, and tracking into the right upper thigh. Stable trace perihepatic ascites. Cholelithiasis. Stable simple appearing large fluid collection along the vaginal cuff/left adnexa. 5. Aortic atherosclerosis. Aortic Atherosclerosis (ICD10-I70.0). Electronically Signed   By: Van Clines M.D.   On: 10/13/2020 20:30   CT Head Wo Contrast  Result Date: 10/13/2020 CLINICAL DATA:  Confusion, jaundice, unexplained skin tear on the left upper arm EXAM: CT HEAD WITHOUT CONTRAST TECHNIQUE: Contiguous axial images were obtained from the base of the skull through the vertex without intravenous contrast. COMPARISON:  CT head 10/13/2020 FINDINGS: Brain: The brainstem, cerebellum, cerebral peduncles, thalami, basal ganglia, basilar cisterns, and ventricular system appear within normal limits. Periventricular white matter and corona radiata hypodensities  favor chronic ischemic microvascular white matter disease. No intracranial hemorrhage, mass lesion, or acute CVA. Vascular: There is atherosclerotic calcification of the cavernous carotid arteries bilaterally. Skull: Unremarkable Sinuses/Orbits: Mucous retention cyst in the right maxillary sinus.  Chronic right sphenoid sinusitis. Other: No supplemental non-categorized findings. IMPRESSION: 1. No acute intracranial findings. 2. Periventricular white matter and corona radiata hypodensities favor chronic ischemic microvascular white matter disease. 3. Mucous retention cyst in the right maxillary sinus. Chronic right sphenoid sinusitis. Electronically Signed   By: Van Clines M.D.   On: 10/13/2020 20:13   Chest Portable 1 View  Result Date: 10/13/2020 CLINICAL DATA:  Preop left hip fracture EXAM: PORTABLE CHEST 1 VIEW COMPARISON:  06/22/2020 FINDINGS: The heart size and mediastinal contours are within normal limits. Both lungs are clear. The visualized skeletal structures are unremarkable. IMPRESSION: No active disease. Electronically Signed   By: Ulyses Jarred M.D.   On: 10/13/2020 23:37   DG Humerus Left  Result Date: 10/13/2020 CLINICAL DATA:  Bruising and pain along the left arm EXAM: LEFT HUMERUS - 2+ VIEW COMPARISON:  Radiographs from 10/10/2020 FINDINGS: Deformity of the proximal humeral shaft related to a remote prior surgical neck fracture (originally shown on 11/18/2016). No new fracture is identified. No acute bony findings or abnormal gas tracking in the soft tissues. The focal soft tissue swelling overlying the mid humerus laterally is reduced compared to the prior exam. IMPRESSION: 1. Soft tissue swelling overlying the mid humerus laterally is reduced compared to the prior exam. No acute bony findings. 2. Deformity from old healed proximal humeral fracture. Electronically Signed   By: Van Clines M.D.   On: 10/13/2020 20:15        Scheduled Meds:  carvedilol  25 mg Oral BID WC   famotidine  20 mg Oral BID   heparin injection (subcutaneous)  5,000 Units Subcutaneous Q8H   LORazepam  1 mg Oral Once   magnesium oxide  200 mg Oral BID   metFORMIN  500 mg Oral BID WC   pantoprazole  40 mg Oral BID   polyethylene glycol  17 g Oral Daily   senna-docusate  2  tablet Oral BID    LOS: 1 day    Time spent: 35 minutes    Finas Delone Darleen Crocker, DO [facility]  If 7PM-7AM, please contact night-coverage [email or web address] 10/14/2020, 6:56 AM

## 2020-10-15 ENCOUNTER — Encounter (HOSPITAL_COMMUNITY): Payer: Self-pay | Admitting: Orthopedic Surgery

## 2020-10-15 ENCOUNTER — Other Ambulatory Visit: Payer: Self-pay

## 2020-10-15 DIAGNOSIS — F0391 Unspecified dementia with behavioral disturbance: Secondary | ICD-10-CM | POA: Diagnosis not present

## 2020-10-15 DIAGNOSIS — I5032 Chronic diastolic (congestive) heart failure: Secondary | ICD-10-CM

## 2020-10-15 DIAGNOSIS — I10 Essential (primary) hypertension: Secondary | ICD-10-CM

## 2020-10-15 DIAGNOSIS — E440 Moderate protein-calorie malnutrition: Secondary | ICD-10-CM

## 2020-10-15 DIAGNOSIS — E119 Type 2 diabetes mellitus without complications: Secondary | ICD-10-CM

## 2020-10-15 DIAGNOSIS — Z515 Encounter for palliative care: Secondary | ICD-10-CM

## 2020-10-15 DIAGNOSIS — Z419 Encounter for procedure for purposes other than remedying health state, unspecified: Secondary | ICD-10-CM | POA: Diagnosis not present

## 2020-10-15 DIAGNOSIS — D539 Nutritional anemia, unspecified: Secondary | ICD-10-CM

## 2020-10-15 DIAGNOSIS — F03918 Unspecified dementia, unspecified severity, with other behavioral disturbance: Secondary | ICD-10-CM

## 2020-10-15 DIAGNOSIS — Z7189 Other specified counseling: Secondary | ICD-10-CM

## 2020-10-15 DIAGNOSIS — S72002A Fracture of unspecified part of neck of left femur, initial encounter for closed fracture: Secondary | ICD-10-CM | POA: Diagnosis not present

## 2020-10-15 DIAGNOSIS — W19XXXA Unspecified fall, initial encounter: Secondary | ICD-10-CM

## 2020-10-15 LAB — GLUCOSE, CAPILLARY
Glucose-Capillary: 112 mg/dL — ABNORMAL HIGH (ref 70–99)
Glucose-Capillary: 105 mg/dL — ABNORMAL HIGH (ref 70–99)
Glucose-Capillary: 158 mg/dL — ABNORMAL HIGH (ref 70–99)
Glucose-Capillary: 145 mg/dL — ABNORMAL HIGH (ref 70–99)
Glucose-Capillary: 122 mg/dL — ABNORMAL HIGH (ref 70–99)
Glucose-Capillary: 128 mg/dL — ABNORMAL HIGH (ref 70–99)

## 2020-10-15 LAB — CBC
HCT: 28.2 % — ABNORMAL LOW (ref 36.0–46.0)
Hemoglobin: 8.8 g/dL — ABNORMAL LOW (ref 12.0–15.0)
MCH: 31.1 pg (ref 26.0–34.0)
MCHC: 31.2 g/dL (ref 30.0–36.0)
MCV: 99.6 fL (ref 80.0–100.0)
Platelets: 131 10*3/uL — ABNORMAL LOW (ref 150–400)
RBC: 2.83 MIL/uL — ABNORMAL LOW (ref 3.87–5.11)
RDW: 23 % — ABNORMAL HIGH (ref 11.5–15.5)
WBC: 12 10*3/uL — ABNORMAL HIGH (ref 4.0–10.5)
nRBC: 0 % (ref 0.0–0.2)

## 2020-10-15 LAB — COMPREHENSIVE METABOLIC PANEL
ALT: 19 U/L (ref 0–44)
AST: 32 U/L (ref 15–41)
Albumin: 2.1 g/dL — ABNORMAL LOW (ref 3.5–5.0)
Alkaline Phosphatase: 230 U/L — ABNORMAL HIGH (ref 38–126)
Anion gap: 9 (ref 5–15)
BUN: 8 mg/dL (ref 8–23)
CO2: 26 mmol/L (ref 22–32)
Calcium: 8.8 mg/dL — ABNORMAL LOW (ref 8.9–10.3)
Chloride: 105 mmol/L (ref 98–111)
Creatinine, Ser: 0.85 mg/dL (ref 0.44–1.00)
GFR, Estimated: >60 mL/min (ref >60)
Glucose, Bld: 104 mg/dL — ABNORMAL HIGH (ref 70–99)
Potassium: 4 mmol/L (ref 3.5–5.1)
Sodium: 140 mmol/L (ref 135–145)
Total Bilirubin: 2.6 mg/dL — ABNORMAL HIGH (ref 0.3–1.2)
Total Protein: 4.4 g/dL — ABNORMAL LOW (ref 6.5–8.1)

## 2020-10-15 LAB — VITAMIN D 25 HYDROXY (VIT D DEFICIENCY, FRACTURES): Vit D, 25-Hydroxy: 34.96 ng/mL (ref 30–100)

## 2020-10-15 LAB — MAGNESIUM: Magnesium: 1.7 mg/dL (ref 1.7–2.4)

## 2020-10-15 MED ORDER — ASCORBIC ACID 500 MG PO TABS
500.0000 mg | ORAL_TABLET | Freq: Every day | ORAL | Status: DC
  Administered 2020-10-15 – 2020-10-22 (×8): 500 mg via ORAL
  Filled 2020-10-15 (×8): qty 1

## 2020-10-15 MED ORDER — MELATONIN 3 MG PO TABS
3.0000 mg | ORAL_TABLET | Freq: Every day | ORAL | Status: DC
  Administered 2020-10-15 – 2020-10-21 (×7): 3 mg via ORAL
  Filled 2020-10-15 (×7): qty 1

## 2020-10-15 MED ORDER — HALOPERIDOL LACTATE 5 MG/ML IJ SOLN
1.0000 mg | Freq: Four times a day (QID) | INTRAMUSCULAR | Status: DC | PRN
  Administered 2020-10-15: 1 mg via INTRAVENOUS
  Filled 2020-10-15 (×2): qty 1

## 2020-10-15 MED ORDER — VITAMIN D 25 MCG (1000 UNIT) PO TABS
2000.0000 [IU] | ORAL_TABLET | Freq: Two times a day (BID) | ORAL | Status: DC
  Administered 2020-10-15 – 2020-10-22 (×15): 2000 [IU] via ORAL
  Filled 2020-10-15 (×15): qty 2

## 2020-10-15 MED ORDER — HALOPERIDOL LACTATE 5 MG/ML IJ SOLN
1.0000 mg | Freq: Once | INTRAMUSCULAR | Status: AC
  Administered 2020-10-15: 1 mg via INTRAVENOUS
  Filled 2020-10-15: qty 1

## 2020-10-15 MED ORDER — HYDROCODONE-ACETAMINOPHEN 5-325 MG PO TABS: 0.5000 | ORAL_TABLET | ORAL | Status: DC | PRN

## 2020-10-15 MED ORDER — QUETIAPINE FUMARATE 25 MG PO TABS: 25.0000 mg | ORAL_TABLET | Freq: Every evening | ORAL | Status: DC | PRN

## 2020-10-15 NOTE — Progress Notes (Signed)
Nutrition Follow-up  DOCUMENTATION CODES:   Not applicable  INTERVENTION:   -Continue MVI with minerals daily -Continue Ensure Enlive po BID, each supplement provides 350 kcal and 20 grams of protein  NUTRITION DIAGNOSIS:   Increased nutrient needs related to post-op healing as evidenced by estimated needs.  Ongoing  GOAL:   Patient will meet greater than or equal to 90% of their needs  Progressing   MONITOR:   PO intake, Supplement acceptance, Diet advancement, Labs, Weight trends, Skin, I & O's  REASON FOR ASSESSMENT:   Consult Assessment of nutrition requirement/status, Hip fracture protocol  ASSESSMENT:   Robin Guzman is a 86 y.o. female with medical history significant of osteoarthritis, CAD, history of MI, chronic diastolic heart failure, dementia, type 2 diabetes, hyperlipidemia, dyspnea on exertion, hypertension, history of skin cancer, history of cholelithiasis with acute cholestatic hepatitis admitted from November 10 November 15 and underwent ERCP and removal of her CBD stone who is coming from the nursing home due to having a fall at the facility.  11/23- s/p PROCEDURE:  1. CANNULATED SCREW FIXATION OF LEFT FEMORAL NECK FRACTURE WITH 8.0 MM BIOMET CANNULATED SCREWS WITH STAR DRIVE HEADS 2. DRESSING CHANGE UNDER ANESTHESIA FOR LEFT LEG SKIN TEAR 11/24- s/p BSE- downgraded to dysphagia 1 diet with thin liquids  Reviewed I/O's: +1.4 L x 24 hours  UOP: 175 ml x 24 hours  Pt sitting in recliner chair, pleasantly confused at time of visit. Pt very HOH; responded to each question to "don't you worry; I'm not going anywhere". Pt consuming peanut butter without difficulty and states she eats well. No meal completions documented. She is consuming Ensure supplements.   Medications reviewed and include colace, vitamin D3, magnesium oxide, senna, and miralax.  Labs reviewed: CBGS: 112-158 (inpatient orders for glycemic control are 0-9 units insulin aspart every 4  hours).   NUTRITION - FOCUSED PHYSICAL EXAM:    Most Recent Value  Orbital Region No depletion  Upper Arm Region Mild depletion  Thoracic and Lumbar Region No depletion  Buccal Region No depletion  Temple Region No depletion  Clavicle Bone Region No depletion  Clavicle and Acromion Bone Region No depletion  Scapular Bone Region No depletion  Dorsal Hand Mild depletion  Patellar Region No depletion  Anterior Thigh Region No depletion  Posterior Calf Region No depletion  Edema (RD Assessment) Mild  Hair Reviewed  Eyes Reviewed  Mouth Reviewed  Skin Reviewed  Nails Reviewed       Diet Order:   Diet Order            DIET - DYS 1 Room service appropriate? No; Fluid consistency: Thin  Diet effective now                 EDUCATION NEEDS:   No education needs have been identified at this time  Skin:  Skin Assessment: Skin Integrity Issues: Skin Integrity Issues:: Incisions Incisions: closed lt hip, lt foot Other: skin tear lt leg  Last BM:  Unknown  Height:   Ht Readings from Last 1 Encounters:  10/13/20 5\' 4"  (1.626 m)    Weight:   Wt Readings from Last 1 Encounters:  10/13/20 66 kg    Ideal Body Weight:  54.5 kg  BMI:  Body mass index is 24.98 kg/m.  Estimated Nutritional Needs:   Kcal:  1650-1850  Protein:  80-95 grams  Fluid:  > 1.6 L    Loistine Chance, RD, LDN, [place] Registered Dietitian II Certified Diabetes Care  and Education Specialist Please refer to [facility] for RD and/or RD on-call/weekend/after hours pager

## 2020-10-15 NOTE — Progress Notes (Addendum)
[facility] PROGRESS NOTE    Progress Note  Robin Guzman  DVV:[id number] DOB: Mar 05, 1928 DOA: 10/13/2020 PCP: Celene Squibb, MD     Brief Narrative:   Robin Guzman is an 86 y.o. female past medical history significant osteoarthritis, CAD with a history of an MI, chronic diastolic heart failure, dementia diabetes mellitus type 2, essential hypertension, with history of choledocholithiasis admitted on November 10 who underwent ERCP with removal of CBD stone coming back from the nursing home due to having a fall. To have a low closed left hip fracture and transferred to [facility].  Assessment/Plan:   Acute confusional state superimposed on advanced dementia: Use Seroquel at bedtime we will start Haldol IV as needed. Place a sitter at bedside.  Closed left hip fracture, initial encounter [facility]) He has been consulted commended surgical intervention and she is status post cannulated fixation of the left femoral neck Physical therapy evaluation is pending. TOC has been consulted he probably needs skilled nursing facility.  Chronic diastolic heart failure ([place]): No signs of decompensation will restart diuretic therapy continue beta-blockers.  Essential hypertension Seems fairly controlled continue current regimen.  Controlled type 2 diabetes mellitus without complication, without long-term current use of insulin (HCC) Continue to hold Metformin, start CBGs before meals and at bedtime continue sliding scale insulin.  CAD/dyslipidemia: No longer on Lipitor, continue Coreg.  Paroxysmal atrial tachycardia: Try to keep potassium greater than 4 magnesium greater than 2 ischemic sinus rhythm now.  Microcytic anemia: CBC, shows a mild drop in hemoglobin from 9-8.8 question normal variation.  Choledocholithiasis: Noted.  Moderate protein caloric malnutrition: Awaiting nutritionist recommendations.   DVT prophylaxis: lovenox Family Communication:Daughter Status is:  Inpatient  Remains inpatient appropriate because:Hemodynamically unstable   Dispo: The patient is from: SNF              Anticipated d/c is to: SNF              Anticipated d/c date is: 3 days              Patient currently is not medically stable to d/c.        Code Status:     Code Status Orders  (From admission, onward)         Start     Ordered   10/13/20 2306  Do not attempt resuscitation (DNR)  Continuous       Question Answer Comment  In the event of cardiac or respiratory ARREST Do not call a "code blue"   In the event of cardiac or respiratory ARREST Do not perform Intubation, CPR, defibrillation or ACLS   In the event of cardiac or respiratory ARREST Use medication by any route, position, wound care, and other measures to relive pain and suffering. May use oxygen, suction and manual treatment of airway obstruction as needed for comfort.      10/13/20 2307        Code Status History    Date Active Date Inactive Code Status Order ID Comments User Context   10/13/2020 2308 10/13/2020 2308 DNR [id number]  Reubin Milan, MD ED   10/13/2020 1754 10/13/2020 2307 DNR [id number]  Fredia Sorrow, MD ED   10/01/2020 2017 10/06/2020 2127 DNR [id number]  Elgergawy, Silver Huguenin, MD ED   06/23/2020 0011 06/25/2020 1946 Full Code [id number]  Bernadette Hoit, DO ED   07/26/2017 1630 07/29/2017 1751 Full Code [id number]  Lendon Colonel, NP ED   12/16/2013 1107 12/17/2013 1830 Full Code [id number]  Samuella Cota, MD Inpatient   Advance Care Planning Activity    Advance Directive Documentation     Most Recent Value  Type of Advance Directive Out of facility DNR (pink MOST or yellow form)  Pre-existing out of facility DNR order (yellow form or pink MOST form) --  "MOST" Form in Place? --        IV Access:    Peripheral IV   Procedures and diagnostic studies:   CT ABDOMEN PELVIS WO CONTRAST  Result Date: 10/13/2020 CLINICAL DATA:  Abdominal trauma, jaundice.  EXAM: CT ABDOMEN AND PELVIS WITHOUT CONTRAST TECHNIQUE: Multidetector CT imaging of the abdomen and pelvis was performed following the standard protocol without IV contrast. COMPARISON:  10/01/2020 FINDINGS: Lower chest: Coronary and aortic atherosclerosis. Mild cardiomegaly. Hepatobiliary: Gallstones in the gallbladder. No well-defined or definite residual biliary dilatation. Pancreas: Unremarkable Spleen: Unremarkable Adrenals/Urinary Tract: Bilateral renal cysts of varying complexity are unchanged. 1.4 by 1.4 cm myelolipoma of the lateral limb of the right adrenal gland. Stomach/Bowel: Scattered colonic diverticula. There a few diverticula of the terminal ileum is well. Sigmoid colon diverticulosis without active diverticulitis. Vascular/Lymphatic: Aortoiliac atherosclerotic vascular disease. No pathologic adenopathy is identified. Reproductive: Uterus absent. Stable 9.9 by 6.7 cm fluid density structure along the vaginal cuff/left adnexa. Other: Stable trace perihepatic ascites. Increased flank edema bilaterally and increased edema lateral to the hips in the subcutaneous tissues, and tracking into the right upper thigh. Musculoskeletal: Healing fractures of the left lower fifth and sixth ribs. Subacute subcapital fracture of the left femoral neck, without current volume loss or avascular necrosis of the femoral head. 60% compression fracture at L1 fracture plane along the inferior endplate and about 4 mm of posterior bony retropulsion, not appreciably changed from 10/01/2020. Lower thoracic spondylosis with bridging fusion. Grade 1 degenerative anterolisthesis at L4-5. IMPRESSION: 1. Subacute subcapital fracture of the left femoral neck, without current volume loss or avascular necrosis of the femoral head. 2. Healing fractures of the left lower fifth and sixth ribs. 3. 60% compression fracture at L1, not appreciably changed from 10/01/2020. 4. Other imaging findings of potential clinical significance: Coronary  atherosclerosis. Mild cardiomegaly. Bilateral renal cysts of varying complexity are unchanged. Sigmoid colon diverticulosis. Small myelolipoma of the right adrenal gland. Increased edema lateral to the hips in the subcutaneous tissues, and tracking into the right upper thigh. Stable trace perihepatic ascites. Cholelithiasis. Stable simple appearing large fluid collection along the vaginal cuff/left adnexa. 5. Aortic atherosclerosis. Aortic Atherosclerosis (ICD10-I70.0). Electronically Signed   By: Van Clines M.D.   On: 10/13/2020 20:30   CT Head Wo Contrast  Result Date: 10/13/2020 CLINICAL DATA:  Confusion, jaundice, unexplained skin tear on the left upper arm EXAM: CT HEAD WITHOUT CONTRAST TECHNIQUE: Contiguous axial images were obtained from the base of the skull through the vertex without intravenous contrast. COMPARISON:  CT head 10/13/2020 FINDINGS: Brain: The brainstem, cerebellum, cerebral peduncles, thalami, basal ganglia, basilar cisterns, and ventricular system appear within normal limits. Periventricular white matter and corona radiata hypodensities favor chronic ischemic microvascular white matter disease. No intracranial hemorrhage, mass lesion, or acute CVA. Vascular: There is atherosclerotic calcification of the cavernous carotid arteries bilaterally. Skull: Unremarkable Sinuses/Orbits: Mucous retention cyst in the right maxillary sinus. Chronic right sphenoid sinusitis. Other: No supplemental non-categorized findings. IMPRESSION: 1. No acute intracranial findings. 2. Periventricular white matter and corona radiata hypodensities favor chronic ischemic microvascular white matter disease. 3. Mucous retention cyst in the right maxillary sinus. Chronic right sphenoid sinusitis. Electronically Signed  By: Van Clines M.D.   On: 10/13/2020 20:13   Chest Portable 1 View  Result Date: 10/13/2020 CLINICAL DATA:  Preop left hip fracture EXAM: PORTABLE CHEST 1 VIEW COMPARISON:   06/22/2020 FINDINGS: The heart size and mediastinal contours are within normal limits. Both lungs are clear. The visualized skeletal structures are unremarkable. IMPRESSION: No active disease. Electronically Signed   By: Ulyses Jarred M.D.   On: 10/13/2020 23:37   DG Humerus Left  Result Date: 10/13/2020 CLINICAL DATA:  Bruising and pain along the left arm EXAM: LEFT HUMERUS - 2+ VIEW COMPARISON:  Radiographs from 10/10/2020 FINDINGS: Deformity of the proximal humeral shaft related to a remote prior surgical neck fracture (originally shown on 11/18/2016). No new fracture is identified. No acute bony findings or abnormal gas tracking in the soft tissues. The focal soft tissue swelling overlying the mid humerus laterally is reduced compared to the prior exam. IMPRESSION: 1. Soft tissue swelling overlying the mid humerus laterally is reduced compared to the prior exam. No acute bony findings. 2. Deformity from old healed proximal humeral fracture. Electronically Signed   By: Van Clines M.D.   On: 10/13/2020 20:15   DG C-Arm 1-60 Min  Result Date: 10/14/2020 CLINICAL DATA:  Cannulated screws. EXAM: OPERATIVE left HIP (WITH PELVIS IF PERFORMED) 2 VIEWS TECHNIQUE: Fluoroscopic spot image(s) were submitted for interpretation post-operatively. COMPARISON:  CT October 13, 2020. FINDINGS: Fluoro time: 1 minutes 5 seconds. Two C-arm fluoroscopic images were obtained intraoperatively and submitted for post operative interpretation. These images demonstrate fixation of a left subtrochanteric fracture with cannulated screws. Please see the performing provider's procedural report for further detail. IMPRESSION: Intraoperative fluoroscopic images, as detailed above. Electronically Signed   By: Margaretha Sheffield MD   On: 10/14/2020 16:04   DG Hip Port Unilat With Pelvis 1V Left  Result Date: 10/14/2020 CLINICAL DATA:  Postop EXAM: DG HIP (WITH OR WITHOUT PELVIS) 1V PORT LEFT COMPARISON:  10/13/2020 FINDINGS:  Interval 3 screw fixation of proximal left femur for femoral neck fracture. Intact hardware. Near anatomic alignment. IMPRESSION: Interval screw fixation of proximal left femur for femoral neck fracture. Electronically Signed   By: Donavan Foil M.D.   On: 10/14/2020 17:16   DG HIP OPERATIVE UNILAT W OR W/O PELVIS LEFT  Result Date: 10/14/2020 CLINICAL DATA:  Cannulated screws. EXAM: OPERATIVE left HIP (WITH PELVIS IF PERFORMED) 2 VIEWS TECHNIQUE: Fluoroscopic spot image(s) were submitted for interpretation post-operatively. COMPARISON:  CT October 13, 2020. FINDINGS: Fluoro time: 1 minutes 5 seconds. Two C-arm fluoroscopic images were obtained intraoperatively and submitted for post operative interpretation. These images demonstrate fixation of a left subtrochanteric fracture with cannulated screws. Please see the performing provider's procedural report for further detail. IMPRESSION: Intraoperative fluoroscopic images, as detailed above. Electronically Signed   By: Margaretha Sheffield MD   On: 10/14/2020 16:04     Medical Consultants:    None.  Anti-Infectives:   none  Subjective:    Heavenleigh J Klopf agitated, confused and screaming  Objective:    Vitals:   10/14/20 2033 10/14/20 2251 10/15/20 0154 10/15/20 0500  BP: 107/79 124/61 (!) 149/62 (!) 153/71  Pulse: 68 68 81 74  Resp: 15 16 16 17   Temp: 98.4 F (36.9 C) 97.6 F (36.4 C) 98.5 F (36.9 C) 97.7 F (36.5 C)  TempSrc:  Oral Oral Oral  SpO2: 93% 96% 96% 95%  Weight:      Height:       SpO2: 95 % O2 Flow  Rate (L/min): 5 L/min   Intake/Output Summary (Last 24 hours) at 10/15/2020 0845 Last data filed at 10/15/2020 0300 Gross per 24 hour  Intake 1600 ml  Output 225 ml  Net 1375 ml   Filed Weights   10/13/20 1646  Weight: 66 kg    Exam: General exam: In no acute distress. Respiratory system: Good air movement and clear to auscultation. Cardiovascular system: S1 & S2 heard, RRR. No JVD. Gastrointestinal  system: Abdomen is nondistended, soft and nontender.  Extremities: No pedal edema. Skin: No rashes, lesions or ulcers Psychiatry: Judgement and insight appear normal. Mood & affect appropriate.    Data Reviewed:    Labs: Basic Metabolic Panel: Recent Labs  Lab 10/13/20 1744 10/14/20 1732 10/15/20 0153  NA 137  --  140  K 4.0  --  4.0  CL 106  --  105  CO2 23  --  26  GLUCOSE 160*  --  104*  BUN 13  --  8  CREATININE 0.96 0.88 0.85  CALCIUM 9.0  --  8.8*   GFR Estimated Creatinine Clearance: 41.1 mL/min (by C-G formula based on SCr of 0.85 mg/dL). Liver Function Tests: Recent Labs  Lab 10/13/20 1744 10/15/20 0153  AST 49* 32  ALT 27 19  ALKPHOS 303* 230*  BILITOT 3.6* 2.6*  PROT 5.9* 4.4*  ALBUMIN 2.9* 2.1*   Recent Labs  Lab 10/13/20 1744  LIPASE 27   No results for input(s): AMMONIA in the last 168 hours. Coagulation profile No results for input(s): INR, PROTIME in the last 168 hours. COVID-19 Labs  No results for input(s): DDIMER, FERRITIN, LDH, CRP in the last 72 hours.  Lab Results  Component Value Date   SARSCOV2NAA NEGATIVE 10/13/2020   SARSCOV2NAA NEGATIVE 10/01/2020   [place] NEGATIVE 06/24/2020   [place] NEGATIVE 06/22/2020    CBC: Recent Labs  Lab 10/13/20 1744 10/14/20 1732 10/15/20 0153  WBC 10.7* 10.5 12.0*  HGB 9.8* 9.2* 8.8*  HCT 32.5* 29.4* 28.2*  MCV 102.5* 99.7 99.6  PLT 204 143* 131*   Cardiac Enzymes: No results for input(s): CKTOTAL, CKMB, CKMBINDEX, TROPONINI in the last 168 hours. BNP (last 3 results) No results for input(s): PROBNP in the last 8760 hours. CBG: Recent Labs  Lab 10/14/20 1523 10/14/20 2025 10/15/20 0027 10/15/20 0453 10/15/20 0830  GLUCAP 113* 162* 105* 112* 158*   D-Dimer: No results for input(s): DDIMER in the last 72 hours. Hgb A1c: No results for input(s): HGBA1C in the last 72 hours. Lipid Profile: No results for input(s): CHOL, HDL, LDLCALC, TRIG, CHOLHDL, LDLDIRECT in the  last 72 hours. Thyroid function studies: No results for input(s): TSH, T4TOTAL, T3FREE, THYROIDAB in the last 72 hours.  Invalid input(s): FREET3 Anemia work up: No results for input(s): VITAMINB12, FOLATE, FERRITIN, TIBC, IRON, RETICCTPCT in the last 72 hours. Sepsis Labs: Recent Labs  Lab 10/13/20 1744 10/14/20 1732 10/15/20 0153  WBC 10.7* 10.5 12.0*   Microbiology Recent Results (from the past 240 hour(s))  Resp Panel by RT-PCR (Flu A&B, Covid) Nasopharyngeal Swab     Status: None   Collection Time: 10/13/20 10:31 PM   Specimen: Nasopharyngeal Swab; Nasopharyngeal(NP) swabs in vial transport medium  Result Value Ref Range Status   SARS Coronavirus 2 by RT PCR NEGATIVE NEGATIVE Final    Comment: (NOTE) SARS-CoV-2 target nucleic acids are NOT DETECTED.  The SARS-CoV-2 RNA is generally detectable in upper respiratory specimens during the acute phase of infection. The lowest concentration of SARS-CoV-2 viral copies  this assay can detect is 138 copies/mL. A negative result does not preclude SARS-Cov-2 infection and should not be used as the sole basis for treatment or other patient management decisions. A negative result may occur with  improper specimen collection/handling, submission of specimen other than nasopharyngeal swab, presence of viral mutation(s) within the areas targeted by this assay, and inadequate number of viral copies(<138 copies/mL). A negative result must be combined with clinical observations, patient history, and epidemiological information. The expected result is Negative.  Fact Sheet for Patients:  EntrepreneurPulse.com.au  Fact Sheet for Healthcare Providers:  IncredibleEmployment.be  This test is no t yet approved or cleared by the [place] FDA and  has been authorized for detection and/or diagnosis of SARS-CoV-2 by FDA under an Emergency Use Authorization (EUA). This EUA will remain  in effect (meaning  this test can be used) for the duration of the COVID-19 declaration under Section 564(b)(1) of the Act, 21 U.S.C.section 360bbb-3(b)(1), unless the authorization is terminated  or revoked sooner.       Influenza A by PCR NEGATIVE NEGATIVE Final   Influenza B by PCR NEGATIVE NEGATIVE Final    Comment: (NOTE) The Xpert Xpress SARS-CoV-2/FLU/RSV plus assay is intended as an aid in the diagnosis of influenza from Nasopharyngeal swab specimens and should not be used as a sole basis for treatment. Nasal washings and aspirates are unacceptable for Xpert Xpress SARS-CoV-2/FLU/RSV testing.  Fact Sheet for Patients: EntrepreneurPulse.com.au  Fact Sheet for Healthcare Providers: IncredibleEmployment.be  This test is not yet approved or cleared by the [place] FDA and has been authorized for detection and/or diagnosis of SARS-CoV-2 by FDA under an Emergency Use Authorization (EUA). This EUA will remain in effect (meaning this test can be used) for the duration of the COVID-19 declaration under Section 564(b)(1) of the Act, 21 U.S.C. section 360bbb-3(b)(1), unless the authorization is terminated or revoked.  Performed at [facility], [address], [place], [place] [address]      Medications:   . acetaminophen  500 mg Oral Q8H  . carvedilol  25 mg Oral BID WC  . docusate sodium  100 mg Oral BID  . enoxaparin (LOVENOX) injection  40 mg Subcutaneous Q24H  . famotidine  20 mg Oral BID  . feeding supplement  237 mL Oral BID BM  . insulin aspart  0-9 Units Subcutaneous Q4H  . LORazepam  1 mg Oral Once  . magnesium oxide  200 mg Oral BID  . multivitamin with minerals  1 tablet Oral Daily  . pantoprazole  40 mg Oral BID  . polyethylene glycol  17 g Oral Daily  . senna-docusate  2 tablet Oral BID   Continuous Infusions:    LOS: 2 days   Charlynne Cousins  [facility]  10/15/2020, 8:45 AM

## 2020-10-15 NOTE — Progress Notes (Signed)
[facility] Progress Note  Patient ID: Robin Guzman MRN: [id number] DOB/AGE: Dec 14, 1927 86 y.o.  Subjective:  No acute issues Not participatory in exam  Not speaking   Pulled off all dressings   ROS As above  Objective:   VITALS:   Vitals:   10/14/20 2033 10/14/20 2251 10/15/20 0154 10/15/20 0500  BP: 107/79 124/61 (!) 149/62 (!) 153/71  Pulse: 68 68 81 74  Resp: 15 16 16 17   Temp: 98.4 F (36.9 C) 97.6 F (36.4 C) 98.5 F (36.9 C) 97.7 F (36.5 C)  TempSrc:  Oral Oral Oral  SpO2: 93% 96% 96% 95%  Weight:      Height:        Estimated body mass index is 24.98 kg/m as calculated from the following:   Height as of this encounter: 5\' 4"  (1.626 m).   Weight as of this encounter: 66 kg.   Intake/Output      11/23 0701 - 11/24 0700 11/24 0701 - 11/25 0700   I.V. (mL/kg) 1300 (19.7)    IV Piggyback 300    Total Intake(mL/kg) 1600 (24.2)    Urine (mL/kg/hr) 175 (0.1)    Blood 50    Total Output 225    Net +1375           LABS  Results for orders placed or performed during the hospital encounter of 10/13/20 (from the past 24 hour(s))  Glucose, capillary     Status: Abnormal   Collection Time: 10/14/20 12:58 PM  Result Value Ref Range   Glucose-Capillary 108 (H) 70 - 99 mg/dL  Glucose, capillary     Status: Abnormal   Collection Time: 10/14/20  3:23 PM  Result Value Ref Range   Glucose-Capillary 113 (H) 70 - 99 mg/dL  CBC     Status: Abnormal   Collection Time: 10/14/20  5:32 PM  Result Value Ref Range   WBC 10.5 4.0 - 10.5 K/uL   RBC 2.95 (L) 3.87 - 5.11 MIL/uL   Hemoglobin 9.2 (L) 12.0 - 15.0 g/dL   HCT 29.4 (L) 36 - 46 %   MCV 99.7 80.0 - 100.0 fL   MCH 31.2 26.0 - 34.0 pg   MCHC 31.3 30.0 - 36.0 g/dL   RDW 23.2 (H) 11.5 - 15.5 %   Platelets 143 (L) 150 - 400 K/uL   nRBC 0.0 0.0 - 0.2 %  Creatinine, serum     Status: None   Collection Time: 10/14/20   5:32 PM  Result Value Ref Range   Creatinine, Ser 0.88 0.44 - 1.00 mg/dL   GFR, Estimated >60 >60 mL/min  Glucose, capillary     Status: Abnormal   Collection Time: 10/14/20  8:25 PM  Result Value Ref Range   Glucose-Capillary 162 (H) 70 - 99 mg/dL  Glucose, capillary     Status: Abnormal   Collection Time: 10/15/20 12:27 AM  Result Value Ref Range   Glucose-Capillary 105 (H) 70 - 99 mg/dL  CBC     Status: Abnormal   Collection Time: 10/15/20  1:53 AM  Result Value Ref Range   WBC 12.0 (H) 4.0 - 10.5 K/uL   RBC 2.83 (L) 3.87 - 5.11 MIL/uL   Hemoglobin 8.8 (L) 12.0 - 15.0 g/dL   HCT 28.2 (L) 36 - 46 %  MCV 99.6 80.0 - 100.0 fL   MCH 31.1 26.0 - 34.0 pg   MCHC 31.2 30.0 - 36.0 g/dL   RDW 23.0 (H) 11.5 - 15.5 %   Platelets 131 (L) 150 - 400 K/uL   nRBC 0.0 0.0 - 0.2 %  Comprehensive metabolic panel     Status: Abnormal   Collection Time: 10/15/20  1:53 AM  Result Value Ref Range   Sodium 140 135 - 145 mmol/L   Potassium 4.0 3.5 - 5.1 mmol/L   Chloride 105 98 - 111 mmol/L   CO2 26 22 - 32 mmol/L   Glucose, Bld 104 (H) 70 - 99 mg/dL   BUN 8 8 - 23 mg/dL   Creatinine, Ser 0.85 0.44 - 1.00 mg/dL   Calcium 8.8 (L) 8.9 - 10.3 mg/dL   Total Protein 4.4 (L) 6.5 - 8.1 g/dL   Albumin 2.1 (L) 3.5 - 5.0 g/dL   AST 32 15 - 41 U/L   ALT 19 0 - 44 U/L   Alkaline Phosphatase 230 (H) 38 - 126 U/L   Total Bilirubin 2.6 (H) 0.3 - 1.2 mg/dL   GFR, Estimated >60 >60 mL/min   Anion gap 9 5 - 15  Glucose, capillary     Status: Abnormal   Collection Time: 10/15/20  4:53 AM  Result Value Ref Range   Glucose-Capillary 112 (H) 70 - 99 mg/dL  Glucose, capillary     Status: Abnormal   Collection Time: 10/15/20  8:30 AM  Result Value Ref Range   Glucose-Capillary 158 (H) 70 - 99 mg/dL     PHYSICAL EXAM:   Gen: sitting up in bed, Awake, not really communicating but appears to be in NAD  Ext:       Left Lower Extremities  L hip dressing c/d/i  Ext warm   Skin tear L leg stable    Ecchymosis to L leg stable   Small blisters stable   Ext warm   + DP pulse   Not really participating in exam   Assessment/Plan: 1 Day Post-Op   Principal Problem:   Closed left hip fracture, initial encounter ([place]) Active Problems:   Chronic diastolic heart failure (HCC)   Essential hypertension   Controlled type 2 diabetes mellitus without complication, without long-term current use of insulin (HCC)   Hypercholesteremia   CAD (coronary artery disease)   PAT (paroxysmal atrial tachycardia) (HCC)   Macrocytic anemia   Choledocholithiasis   Moderate protein malnutrition (HCC)   Anti-infectives (From admission, onward)   Start     Dose/Rate Route Frequency Ordered Stop   10/14/20 2000  ceFAZolin (ANCEF) IVPB 2g/100 mL premix        2 g 200 mL/hr over 30 Minutes Intravenous Every 6 hours 10/14/20 1702 10/15/20 0230   10/14/20 1136  ceFAZolin (ANCEF) 2-4 GM/100ML-% IVPB       Note to Pharmacy: Henrine Screws   : cabinet override      10/14/20 1136 10/14/20 1337   10/14/20 1130  ceFAZolin (ANCEF) IVPB 2g/100 mL premix        2 g 200 mL/hr over 30 Minutes Intravenous On call to O.R. 10/14/20 1124 10/14/20 1356    .  POD/HD#: 1  86 y/o female s/p ground level fall with valgus impacted L femoral neck fracture   -L femoral neck fracture s/p closed reduction and percutaneous pinning  PWB L LEx (50%)   Follow as best as possible. Will be difficult given dementia  May need to use WC   ROM as tolerated L hip   PT/OT  Dressing changes as needed  - L leg skin tears  Local wound care   - Pain management:  Tylenol   Minimize narcotics   - ABL anemia/Hemodynamics  Monitor   - Medical issues   Per primary   - DVT/PE prophylaxis:  Lovenox while inpatient   SCDs  - ID:   periop abx  - Metabolic Bone Disease:  Vitamin d levels low normal   Supplement  Fracture indicative of osteoporosis   Fracture liaison consult   - Impediments to fracture  healing:  Osteoporosis  Dementia   - Dispo:  Ortho issues stable  Follow up with ortho in 10-14 days    Jari Pigg, PA-C [phone] (C) 10/15/2020, 9:43 AM  [facility] [place] [id number] [phone] Jenetta [phone] (F)    After 5pm and on the weekends please log on to Amion, go to orthopaedics and the look under the [facility] Call for the provider(s) on call. You can also call our office at [phone] and then follow the prompts to be connected to the call team.

## 2020-10-15 NOTE — Progress Notes (Signed)
Tele monitor off d/t patient continuously taking it off, MD made aware.

## 2020-10-15 NOTE — Evaluation (Signed)
Physical Therapy Evaluation Patient Details Name: Robin Guzman MRN: [id number] DOB: [birth year] Today's Date: 10/15/2020   History of Present Illness  Robin Guzman is a 86 y.o. female with medical history significant of osteoarthritis, CAD, history of MI, chronic diastolic heart failure, dementia, type 2 diabetes, hyperlipidemia, dyspnea on exertion, hypertension, history of skin cancer, history of cholelithiasis with acute cholestatic hepatitis admitted from November 10 November 15 and underwent ERCP and removal of her CBD stone who is coming from the nursing home due to having a fall at the facility, sustaining a L hip fx and skin tear; s/p surgery for hip fixation and lac repair; TDWB LLE; Noting restlessness is complicating recovery  Clinical Impression   Patient is s/p above surgery resulting in functional limitations due to the deficits listed below (see PT Problem List). Noted comes from SNF where she was mostly using a wheelchair for moblity, and walking short distances in her room; Presents to PT overall moving surprisingly well -- except for her inability to keep the weight off of her LLE, thereby increasing the risk of poor healing; Communication difficult during session due to pt confusion and impaired hearing.. Pt overall Min A for bed mobility, Min A x 2 for safety during transfers with and without RW. Pt with poor adherence to [facility] precautions but no pain in operative LE noted during session Patient will benefit from skilled PT to increase their independence and safety with mobility to allow discharge to the venue listed below.       Follow Up Recommendations SNF    Equipment Recommendations  Rolling walker with 5" wheels;3in1 (PT)    Recommendations for Other Services       Precautions / Restrictions Precautions Precautions: Fall;Other (comment) Precaution Comments: very HOH Restrictions Weight Bearing Restrictions: Yes LLE Weight Bearing: Partial weight bearing LLE  Partial Weight Bearing Percentage or Pounds: 50      Mobility  Bed Mobility Overal bed mobility: Needs Assistance Bed Mobility: Supine to Sit     Supine to sit: Min assist;HOB elevated     General bed mobility comments: Min A for advancement of B LE to EOB    Transfers Overall transfer level: Needs assistance Equipment used: Rolling walker (2 wheeled);None Transfers: Sit to/from [employer] to Stand: Min assist;+2 safety/equipment;+2 physical assistance Stand pivot transfers: Min assist;+2 safety/equipment;+2 physical assistance       General transfer comment: Trialed RW with pt noted to leave behind and reach for armrests of chair. Pt transferred bed > BSC > bed > recliner.MIn A x 2 for safety but good pacing, poor adherence to WB precautions.  Ambulation/Gait                Stairs            Wheelchair Mobility    Modified Rankin (Stroke Patients Only)       Balance     Sitting balance-Leahy Scale: Fair       Standing balance-Leahy Scale: Fair Standing balance comment: fair static standing with one UE or no UE support,                              Pertinent Vitals/Pain Pain Assessment: Faces Faces Pain Scale: No hurt Pain Intervention(s): Monitored during session    Home Living Family/patient expects to be discharged to:: Skilled nursing facility  Prior Function Level of Independence: Needs assistance   Gait / Transfers Assistance Needed: per chart review, "pt is 90% non ambulatory,  pt stays in bed or wheelchair most of the time with the exception of her walking small distances in her room."  ADL's / Homemaking Assistance Needed: Assistance with ADLs        Hand Dominance   Dominant Hand: Right    Extremity/Trunk Assessment   Upper Extremity Assessment Upper Extremity Assessment: Defer to OT evaluation    Lower Extremity Assessment Lower Extremity Assessment: LLE  deficits/detail LLE Deficits / Details: Some weakness postop, but notably moving LLE well in the bed, and without grimace; Occasionally using hands to help move LLE; some difficulty with assessment due to [facility] and impaired cognition    Cervical / Trunk Assessment Cervical / Trunk Assessment: Kyphotic  Communication   Communication: HOH  Cognition Arousal/Alertness: Awake/alert Behavior During Therapy: Restless;Impulsive Overall Cognitive Status: History of cognitive impairments - at baseline Area of Impairment: Attention;Memory;Following commands;Safety/judgement;Awareness;Problem solving                   Current Attention Level: Sustained Memory: Decreased recall of precautions;Decreased short-term memory Following Commands: Follows one step commands with increased time Safety/Judgement: Decreased awareness of safety;Decreased awareness of deficits Awareness: Emergent Problem Solving: Difficulty sequencing;Requires verbal cues;Requires tactile cues General Comments: Cognition vs very HOH when it comes to difficulty following commands/answering questions. Pt with impaired safety but able to demo sitting EOB without attempting to get up during session.       General Comments General comments (skin integrity, edema, etc.): Pt placed in chair with chair alarm underneath pt. No alarm box in room, notified NT/RN. OT later located alarm box and connected in pt's room.    Exercises     Assessment/Plan    PT Assessment Patient needs continued PT services  PT Problem List Decreased strength;Decreased range of motion;Decreased activity tolerance;Decreased balance;Decreased mobility;Decreased cognition;Decreased knowledge of use of DME;Decreased safety awareness;Decreased knowledge of precautions       PT Treatment Interventions DME instruction;Gait training;Functional mobility training;Therapeutic activities;Therapeutic exercise;Balance training;Neuromuscular re-education;Cognitive  remediation;Patient/family education;Wheelchair mobility training    PT Goals (Current goals can be found in the Care Plan section)  Acute Rehab PT Goals Patient Stated Goal: sit in the chair and look out the window PT Goal Formulation: Patient unable to participate in goal setting Time For Goal Achievement: 10/29/20 Potential to Achieve Goals: Good    Frequency Min 2X/week   Barriers to discharge        Co-evaluation PT/OT/SLP Co-Evaluation/Treatment: Yes Reason for Co-Treatment: Necessary to address cognition/behavior during functional activity PT goals addressed during session: Mobility/safety with mobility         AM-PAC PT "6 Clicks" Mobility  Outcome Measure Help needed turning from your back to your side while in a flat bed without using bedrails?: A Little Help needed moving from lying on your back to sitting on the side of a flat bed without using bedrails?: A Little Help needed moving to and from a bed to a chair (including a wheelchair)?: A Little Help needed standing up from a chair using your arms (e.g., wheelchair or bedside chair)?: A Lot Help needed to walk in hospital room?: A Lot Help needed climbing 3-5 steps with a railing? : Total 6 Click Score: 14    End of Session Equipment Utilized During Treatment: Gait belt Activity Tolerance: Patient tolerated treatment well Patient left: in chair;with call bell/phone within reach;with chair alarm set;Other (  comment) (OT returned to turn on chair alarm) Nurse Communication: Mobility status PT Visit Diagnosis: Unsteadiness on feet (R26.81);Other abnormalities of gait and mobility (R26.89)    Time: 0786-7544 PT Time Calculation (min) (ACUTE ONLY): 34 min   Charges:   PT Evaluation $PT Eval Moderate Complexity: 1 Mod          Roney [place], [place]  Acute Rehabilitation Services Pager [phone] Office [phone]   [place]ta [place] 10/15/2020, 3:00 PM

## 2020-10-15 NOTE — Evaluation (Signed)
Occupational Therapy Evaluation Patient Details Name: Robin Guzman MRN: [id number] DOB: 05-Nov-1928 Today's Date: 10/15/2020    History of Present Illness Robin Guzman is a 86 y.o. female with medical history significant of osteoarthritis, CAD, history of MI, chronic diastolic heart failure, dementia, type 2 diabetes, hyperlipidemia, dyspnea on exertion, hypertension, history of skin cancer, history of cholelithiasis with acute cholestatic hepatitis admitted from November 10 November 15 and underwent ERCP and removal of her CBD stone who is coming from the nursing home due to having a fall at the facility, sustaining a L hip fx and skin tear; s/p surgery for hip fixation and lac repair; TDWB LLE; Noting restlessness is complicating recovery   Clinical Impression   PTA, pt from [place] and per chart review, pt able to walk short distances, but uses wheelchair primarily. Pt receives assistance with ADLs, but unsure of how much assist needed due to pt poor historian. Communication difficult during session due to pt confusion and impaired hearing.. Pt overall Min A for bed mobility, Min A x 2 for safety during transfers with and without RW. Pt with poor adherence to [facility] precautions but no pain in operative LE noted during session. Due to deficits, pt requires Min A for UB ADLs and up to Max A for LB ADLs. Plan to attempt in reinforcing WB precautions during ADLs/transfers and progressing safety to prevent falls. Recommend DC back to SNF.   Follow Up Recommendations  SNF;Supervision/Assistance - 24 hour    Equipment Recommendations  None recommended by OT    Recommendations for Other Services       Precautions / Restrictions Precautions Precautions: Fall;Other (comment) Precaution Comments: very HOH Restrictions Weight Bearing Restrictions: Yes Other Position/Activity Restrictions: PWB (50%)      Mobility Bed Mobility Overal bed mobility: Needs Assistance Bed Mobility: Supine  to Sit     Supine to sit: Min assist;HOB elevated     General bed mobility comments: Min A for advancement of B LE to EOB    Transfers Overall transfer level: Needs assistance Equipment used: Rolling walker (2 wheeled);None Transfers: Sit to/from [employer] to Stand: Min assist;+2 safety/equipment;+2 physical assistance Stand pivot transfers: Min assist;+2 safety/equipment;+2 physical assistance       General transfer comment: Trialed RW with pt noted to leave behind and reach for armrests of chair. Pt transferred bed > BSC > bed > recliner.MIn A x 2 for safety but good pacing, poor adherence to WB precautions.    Balance Overall balance assessment: Needs assistance;History of Falls Sitting-balance support: Feet supported;No upper extremity supported Sitting balance-Leahy Scale: Fair     Standing balance support: Single extremity supported;During functional activity Standing balance-Leahy Scale: Fair Standing balance comment: fair static standing with one UE or no UE support,                            ADL either performed or assessed with clinical judgement   ADL Overall ADL's : Needs assistance/impaired Eating/Feeding: Set up;Sitting Eating/Feeding Details (indicate cue type and reason): Swtup to eat crackers sitting in recliner Grooming: Supervision/safety;Sitting;Wash/dry face;Brushing hair Grooming Details (indicate cue type and reason): Supervision for grooming tasks sitting EOB without LOB Upper Body Bathing: Minimal assistance;Sitting   Lower Body Bathing: Moderate assistance;Sitting/lateral leans;Sit to/from stand   Upper Body Dressing : Supervision/safety;Sitting   Lower Body Dressing: Maximal assistance;Bed level;Sit to/from stand Lower Body Dressing Details (indicate cue type and reason): Max A to don  socks  Toilet Transfer: Minimal Production assistant, radio Details (indicate cue type and reason): Min A x 2  for safety in pivot to BSC, poor adherence to WB precautions Toileting- Clothing Manipulation and Hygiene: Minimal assistance;Sit to/from stand Toileting - Clothing Manipulation Details (indicate cue type and reason): Min A for maintaining balance/clothing mgmt while pt performed peri care in standing        General ADL Comments: Pt with deficits in cognition/hearing impacting ability to complete ADLs/transfers without assist. Pt with limited ability to adhere to WB precautions     Vision Baseline Vision/History: Wears glasses Wears Glasses: Reading only Patient Visual Report: No change from baseline Vision Assessment?: No apparent visual deficits     Perception     Praxis      Pertinent Vitals/Pain Pain Assessment: Faces Faces Pain Scale: No hurt Pain Intervention(s): Monitored during session     Hand Dominance Right   Extremity/Trunk Assessment Upper Extremity Assessment Upper Extremity Assessment: Generalized weakness   Lower Extremity Assessment Lower Extremity Assessment: Defer to PT evaluation   Cervical / Trunk Assessment Cervical / Trunk Assessment: Kyphotic   Communication Communication Communication: HOH   Cognition Arousal/Alertness: Awake/alert Behavior During Therapy: Restless;Impulsive Overall Cognitive Status: History of cognitive impairments - at baseline Area of Impairment: Attention;Memory;Following commands;Safety/judgement;Awareness;Problem solving                   Current Attention Level: Sustained Memory: Decreased recall of precautions;Decreased short-term memory Following Commands: Follows one step commands with increased time Safety/Judgement: Decreased awareness of safety;Decreased awareness of deficits Awareness: Emergent Problem Solving: Difficulty sequencing;Requires verbal cues;Requires tactile cues General Comments: Cognition vs very HOH when it comes to difficulty following commands/answering questions. Pt with impaired  safety but able to demo sitting EOB without attempting to get up during session.    General Comments  Pt placed in chair with chair alarm underneath pt. No alarm box in room, notified NT/RN. OT later located alarm box and connected in pt's room.    Exercises     Shoulder Instructions      Home Living Family/patient expects to be discharged to:: Skilled nursing facility                                        Prior Functioning/Environment Level of Independence: Needs assistance  Gait / Transfers Assistance Needed: per chart review, "pt is 90% non ambulatory,  pt stays in bed or wheelchair most of the time with the exception of her walking small distances in her room." ADL's / Homemaking Assistance Needed: Assistance with ADLs            OT Problem List: Decreased strength;Decreased activity tolerance;Impaired balance (sitting and/or standing);Decreased cognition;Decreased safety awareness;Decreased knowledge of precautions;Decreased knowledge of use of DME or AE      OT Treatment/Interventions: Self-care/ADL training;Therapeutic exercise;DME and/or AE instruction;Therapeutic activities;Patient/family education;Balance training    OT Goals(Current goals can be found in the care plan section) Acute Rehab OT Goals Patient Stated Goal: sit in the chair and look out the window OT Goal Formulation: With patient Time For Goal Achievement: 10/29/20 Potential to Achieve Goals: Good ADL Goals Pt Will Perform Grooming: with supervision;standing Pt Will Perform Lower Body Dressing: with supervision;sit to/from stand;sitting/lateral leans Pt Will Transfer to Toilet: with supervision;stand pivot transfer;bedside commode Pt Will Perform Toileting - Clothing Manipulation and hygiene: with supervision;sitting/lateral leans;sit to/from stand Additional ADL Goal #  1: Pt to be able to recall WB precautions to maximize safety and healing  OT Frequency: Min 2X/week   Barriers to D/C:             Co-evaluation PT/OT/SLP Co-Evaluation/Treatment: Yes Reason for Co-Treatment: Necessary to address cognition/behavior during functional activity;For patient/therapist safety;To address functional/ADL transfers   OT goals addressed during session: ADL's and self-care      AM-PAC OT "6 Clicks" Daily Activity     Outcome Measure Help from another person eating meals?: A Little Help from another person taking care of personal grooming?: A Little Help from another person toileting, which includes using toliet, bedpan, or urinal?: A Little Help from another person bathing (including washing, rinsing, drying)?: A Lot Help from another person to put on and taking off regular upper body clothing?: A Little Help from another person to put on and taking off regular lower body clothing?: A Lot 6 Click Score: 16   End of Session Equipment Utilized During Treatment: Gait belt;Rolling walker Nurse Communication: Mobility status;Weight bearing status  Activity Tolerance: Patient tolerated treatment well Patient left: in chair;with call bell/phone within reach;with chair alarm set  OT Visit Diagnosis: Other abnormalities of gait and mobility (R26.89);Muscle weakness (generalized) (M62.81);Unsteadiness on feet (R26.81);Repeated falls (R29.6);History of falling (Z91.81);Other symptoms and signs involving cognitive function                Time: 7989-2119 OT Time Calculation (min): 33 min Charges:  OT General Charges $OT Visit: 1 Visit OT Evaluation $OT Eval Moderate Complexity: 1 Mod  Layla Maw, OTR/L  Layla Maw 10/15/2020, 1:36 PM

## 2020-10-15 NOTE — Progress Notes (Signed)
Pt's bed alarm going off. RN went in room and put had pulled hip dressings, gown, and telemetry off. Sheets were bloody from hip and arm skin tear? Pt washed up, foam applied to hip, new gown, and tele reapplied. Mits placed on hands. Bed alarm on.

## 2020-10-15 NOTE — Plan of Care (Signed)

## 2020-10-15 NOTE — Consult Note (Signed)
Consultation Note Date: 10/15/2020   Patient Name: Robin Guzman  DOB: [birth year]  MRN: [id number]  Age / Sex: 86 y.o., female  PCP: Celene Squibb, MD Referring Physician: Aileen Fass, Tammi Klippel, MD  Reason for Consultation: Establishing goals of care  HPI/Patient Profile: 86 y.o. female  with past medical history of dementia, osteoarthritis, CAD, MI, chronic diastolic heart failure, DM type 2, essential hypertension, recent hospital admission for choledocholithiasis s/p ERCP and removal of CBD stone admitted on 10/13/2020 with confusion and fall. Imaging revealed closed left hip fracture s/p closed reduction and percutaneous pinning by ortho. Baseline dementia with agitation inpatient requiring HS Seroquel and PRN IV Haldol. Palliative medicine consultation for goals of care.   Clinical Assessment and Goals of Care:  I have reviewed medical records, discussed with care team, and met patient at bedside. Robin Guzman is sitting up in recliner. She just finished a snack. She is pleasantly confused with baseline dementia. She is asking to leave with me. She does not appear to be in pain or discomfort. No family at bedside.   Spoke with daughter, Robin Guzman via telephone. She is reported POA but in regular communication with her brother and sister about their mother. Robin Guzman shares that the family has decided not to visit the hospital for a few days as their presence seems to be agitating [place] more.   I introduced Palliative Medicine as specialized medical care for people living with serious illness. It focuses on providing relief from the symptoms and stress of a serious illness. The goal is to improve quality of life for both the patient and the family.  We discussed a brief life review of the patient. The patient has had mild dementia for many years but up until August 2021, she was still living home, independent and  still driving. In August 2021, daughter reports her mother suffered a fall and broken back. She was at rehab and then moved into [facility]. Functionally and cognitively, her mother's condition has declined.   Discussed events leading up to admission and course of hospitalization including diagnoses, interventions, plan of care.   I attempted to elicit values and goals of care important to the patient and family. Robin Guzman acknowledges they cannot care for her at home in this state. She feels her mother will need higher level of care after this hospitalization. We discussed SNF rehab and then eventually transition to long-term care at a nursing facility.   Advanced directives, concepts specific to code status, artifical feeding and hydration were discussed. Robin Guzman shares that a few months prior, blood work possibly revealed some sort of cancer, but decision was made not to pursue aggressive work-up with patient's age and frailty. "At her age, don't put her through anything we don't have to" and that this would "upset her more." Robin Guzman confirms decision for DNR code status and that her mother would not wish to be kept alive on machines or with poor quality of life.   Patient does not have a documented MOST form. Introduced and discussed  MOST form. Again, daughter does not plan to visit [facility] for a few days but interested in reviewing and possibly completing electronic MOST form tomorrow afternoon when she is with her brother and sister. Encouraged her to review this evening. F/u phone conference at [place] on 11/25.   Discussed ongoing medical management, PT/OT/SLP efforts, and pain management. Discussed TOC involvement for rehab placement.   Questions and concerns were addressed. Reassured of f/u tomorrow afternoon.     SUMMARY OF RECOMMENDATIONS    Daughter, Robin Guzman, is reported POA. She is in regular communication with two siblings regarding their mother's condition.   Robin Guzman confirms DNR code  status. No MOST form on file. F/u phone conference 11/25 at 1pm to discuss MOST form with patient's three children.   Continue current plan of care and medical management.  Continue PT/OT/SLP efforts.  Continue current diet. Assist and encouragement with meals.  Will need TOC involvement. Daughter hopeful for SNF rehab on discharge.   May benefit from outpatient palliative referral.   Code Status/Advance Care Planning:  DNR  Symptom Management:   Per attending  Palliative Prophylaxis:   Aspiration, Delirium Protocol, Frequent Pain Assessment, Oral Care and Turn Reposition   Psycho-social/Spiritual:   Desire for further Chaplaincy support:yes  Additional Recommendations: Caregiving  Support/Resources  Prognosis:   Guarded  Discharge Planning: [place] for rehab with Palliative care service follow-up      Primary Diagnoses: Present on Admission: . Closed left hip fracture, initial encounter ([place]) . CAD (coronary artery disease) . Chronic diastolic heart failure ([place]) . Essential hypertension . Hypercholesteremia . PAT (paroxysmal atrial tachycardia) ([place]) . Choledocholithiasis . Moderate protein malnutrition ([place]) . Macrocytic anemia   I have reviewed the medical record, interviewed the patient and family, and examined the patient. The following aspects are pertinent.  Past Medical History:  Diagnosis Date  . Arthritis   . CAD (coronary artery disease), native coronary artery    07/27/17 nonobstructive, EF 25-35% by LV gram  . CHF (congestive heart failure) ([place])   . Dementia ([place])   . Diabetes mellitus without complication ([place])   . Dyslipidemia   . Dyspnea   . Dyspnea on exertion   . Gallstone 09/2020  . History of stress test 07/05/2006   High risk scan cardiac cathe was recommended.  [name] Hx of echocardiogram 04/09/2011   EF 55% Mildly hypertrophic left ventricle with normal systolic function, Moderate (grade II) diastolic dysfunction.  Elevated left atrial pressure. No significant valvular abnormalities. No pericardial effusion. Mild to moderate pulmonary arterial hypertension.  . Hypercholesteremia   . Hypertension   . Myocardial infarct ([place])   . Skin cancer    HISTORY OF    Social History   Socioeconomic History  . Marital status: Widowed    Spouse name: Not on file  . Number of children: Not on file  . Years of education: Not on file  . Highest education level: Not on file  Occupational History  . Not on file  Tobacco Use  . Smoking status: Never Smoker  . Smokeless tobacco: Never Used  Vaping Use  . Vaping Use: Never used  Substance and Sexual Activity  . Alcohol use: No  . Drug use: No  . Sexual activity: Not Currently  Other Topics Concern  . Not on file  Social History Narrative  . Not on file   Social Determinants of Health   Financial Resource Strain:   . Difficulty of Paying Living Expenses: Not on file  Food  Insecurity:   . Worried About Charity fundraiser in the Last Year: Not on file  . Ran Out of Food in the Last Year: Not on file  Transportation Needs:   . Lack of Transportation (Medical): Not on file  . Lack of Transportation (Non-Medical): Not on file  Physical Activity:   . Days of Exercise per Week: Not on file  . Minutes of Exercise per Session: Not on file  Stress:   . Feeling of Stress : Not on file  Social Connections:   . Frequency of Communication with Friends and Family: Not on file  . Frequency of Social Gatherings with Friends and Family: Not on file  . Attends Religious Services: Not on file  . Active Member of Clubs or Organizations: Not on file  . Attends Archivist Meetings: Not on file  . Marital Status: Not on file   Family History  Problem Relation Age of Onset  . Cancer Mother   . Heart Problems Father   . Cancer Brother        brain  . Cancer Sister        brain   Scheduled Meds: . vitamin C  500 mg Oral Daily  . carvedilol  25 mg Oral  BID WC  . cholecalciferol  2,000 Units Oral BID  . docusate sodium  100 mg Oral BID  . enoxaparin (LOVENOX) injection  40 mg Subcutaneous Q24H  . famotidine  20 mg Oral BID  . feeding supplement  237 mL Oral BID BM  . insulin aspart  0-9 Units Subcutaneous Q4H  . magnesium oxide  200 mg Oral BID  . multivitamin with minerals  1 tablet Oral Daily  . pantoprazole  40 mg Oral BID  . polyethylene glycol  17 g Oral Daily  . senna-docusate  2 tablet Oral BID   Continuous Infusions: PRN Meds:.acetaminophen **OR** acetaminophen, haloperidol lactate, HYDROmorphone (DILAUDID) injection, menthol-cetylpyridinium **OR** phenol, metoCLOPramide **OR** metoCLOPramide (REGLAN) injection, ondansetron **OR** ondansetron (ZOFRAN) IV, QUEtiapine Medications Prior to Admission:  Prior to Admission medications   Medication Sig Start Date End Date Taking? Authorizing Provider  carvedilol (COREG) 25 MG tablet Take 1 tablet (25 mg total) by mouth 2 (two) times daily with a meal. 10/23/18  Yes Croitoru, Mihai, MD  famotidine (PEPCID) 20 MG tablet Take 20 mg by mouth 2 (two) times daily.   Yes [provider]  metFORMIN (GLUCOPHAGE) 500 MG tablet Take 500 mg by mouth 2 (two) times daily with a meal.   Yes [provider]  pantoprazole (PROTONIX) 40 MG tablet Take 1 tablet (40 mg total) by mouth 2 (two) times daily. 10/06/20  Yes Swayze, Ava, DO  senna-docusate (SENOKOT-S) 8.6-50 MG tablet Take 2 tablets by mouth 2 (two) times daily. 06/25/20  Yes Emokpae, Courage, MD  acetaminophen (TYLENOL) 500 MG tablet Take 1,000 mg by mouth in the morning and at bedtime.    [provider]  aspirin EC 81 MG tablet Take 1 tablet (81 mg total) by mouth daily with breakfast. 06/25/20   Emokpae, Courage, MD  atorvastatin (LIPITOR) 10 MG tablet Take 10 mg by mouth daily.    [provider]  ferrous sulfate 325 (65 FE) MG tablet Take 325 mg by mouth daily.    [provider]    HYDROcodone-acetaminophen (NORCO/VICODIN) 5-325 MG tablet Take 0.5 tablets by mouth every 4 (four) hours as needed for moderate pain or severe pain. Patient not taking: Reported on 10/13/2020 06/25/20  Shon Hale, MD  insulin aspart (NOVOLOG) 100 UNIT/ML FlexPen Inject 0-6 Units into the skin 3 (three) times daily with meals. insulin aspart (novoLOG) injection 0-6 Units 0-6 Units Subcutaneous, 3 times daily with meals CBG < 70: Implement Hypoglycemia Standing Orders and refer to Hypoglycemia Standing Orders sidebar report  CBG 70 - 120: 0 unit CBG 121 - 150: 0 unit  CBG 151 - 200: 0 unit CBG 201 - 250: 1 units CBG 251 - 300:  2units CBG 301 - 350:  3 units  CBG 351 - 400:  5 units  CBG > 400: 6 units Patient not taking: Reported on 10/01/2020 06/25/20   Shon Hale, MD  LORazepam (ATIVAN) 0.5 MG tablet Take 1 tablet (0.5 mg total) by mouth every 12 (twelve) hours as needed for anxiety or sleep. 06/25/20 06/25/21  Shon Hale, MD  Multiple Vitamins-Minerals (CENTRUM ADULTS) TABS Take 1 tablet by mouth daily. Patient not taking: Reported on 10/13/2020    [provider]  polyethylene glycol (MIRALAX) 17 g packet Take 17 g by mouth daily. 06/25/20   Shon Hale, MD   Allergies  Allergen Reactions  . Daypro [Oxaprozin] Shortness Of Breath and Other (See Comments)    weakness  . Advil [Ibuprofen] Other (See Comments)    weakness   Review of Systems  Unable to perform ROS: Dementia   Physical Exam Vitals and nursing note reviewed.  Constitutional:      General: She is awake.     Appearance: She is ill-appearing.  HENT:     Head: Normocephalic and atraumatic.  Pulmonary:     Effort: No tachypnea, accessory muscle usage or respiratory distress.  Musculoskeletal:     Comments: Left hip dressing C/D/I  Neurological:     Mental Status: She is alert.     Comments: Alert, disoriented with baseline dementia  Psychiatric:        Cognition and Memory: Cognition is impaired.     Vital Signs: BP (!) 148/81 (BP Location: Left Arm)   Pulse 78   Temp 98.2 F (36.8 C) (Oral)   Resp 17   Ht 5\' 4"  (1.626 m)   Wt 66 kg   SpO2 98%   BMI 24.98 kg/m  Pain Scale: 0-10   Pain Score: 0-No pain   SpO2: SpO2: 98 % O2 Device:SpO2: 98 % O2 Flow Rate: .O2 Flow Rate (L/min): 5 L/min  IO: Intake/output summary:   Intake/Output Summary (Last 24 hours) at 10/15/2020 1528 Last data filed at 10/15/2020 0300 Gross per 24 hour  Intake 200 ml  Output --  Net 200 ml    LBM:   Baseline Weight: Weight: 66 kg Most recent weight: Weight: 66 kg     Palliative Assessment/Data: PPS 50%   Flowsheet Rows     Most Recent Value  Intake Tab  Referral Department Hospitalist  Unit at Time of Referral Med/Surg Unit  Palliative Care Primary Diagnosis Neurology  Palliative Care Type New Palliative care  Reason for referral Clarify Goals of Care  Date first seen by Palliative Care 10/15/20  Clinical Assessment  Palliative Performance Scale Score 50%  Psychosocial & Spiritual Assessment  Palliative Care Outcomes  Patient/Family meeting held? Yes  Who was at the meeting? daughter  Palliative Care Outcomes Provided psychosocial or spiritual support, ACP counseling assistance, Linked to palliative care logitudinal support, Clarified goals of care      Time Total: 10/17/20 Greater than 50%  of this time was spent counseling and coordinating care related  to the above assessment and plan.  Signed by:  Ihor Dow, DNP, FNP-C Palliative Medicine Team  Phone: [phone] Fax: [phone]  Please contact Palliative Medicine Team phone at [phone] for questions and concerns.  For individual provider: See Shea Evans

## 2020-10-15 NOTE — Evaluation (Signed)
Clinical/Bedside Swallow Evaluation Patient Details  Name: Robin Guzman MRN: [id number] Date of Birth: 07/26/1928  Today's Date: 10/15/2020 Time: SLP Start Time (ACUTE ONLY): 0857 SLP Stop Time (ACUTE ONLY): 0915 SLP Time Calculation (min) (ACUTE ONLY): 18 min  Past Medical History:  Past Medical History:  Diagnosis Date  . Arthritis   . CAD (coronary artery disease), native coronary artery    07/27/17 nonobstructive, EF 25-35% by LV gram  . CHF (congestive heart failure) ([place])   . Dementia ([place])   . Diabetes mellitus without complication ([place])   . Dyslipidemia   . Dyspnea   . Dyspnea on exertion   . Gallstone 09/2020  . History of stress test 07/05/2006   High risk scan cardiac cathe was recommended.  [name] Hx of echocardiogram 04/09/2011   EF 55% Mildly hypertrophic left ventricle with normal systolic function, Moderate (grade II) diastolic dysfunction. Elevated left atrial pressure. No significant valvular abnormalities. No pericardial effusion. Mild to moderate pulmonary arterial hypertension.  . Hypercholesteremia   . Hypertension   . Myocardial infarct ([place])   . Skin cancer    HISTORY OF    Past Surgical History:  Past Surgical History:  Procedure Laterality Date  . ABDOMINAL HYSTERECTOMY    . CATARACT EXTRACTION W/PHACO Right 05/20/2017   Procedure: CATARACT EXTRACTION PHACO AND INTRAOCULAR LENS PLACEMENT RIGHT EYE;  Surgeon: Tonny Branch, MD;  Location: [facility];  Service: Ophthalmology;  Laterality: Right;  CDE: 17.77  . CATARACT EXTRACTION W/PHACO Left 06/21/2017   Procedure: CATARACT EXTRACTION PHACO AND INTRAOCULAR LENS PLACEMENT (IOC);  Surgeon: Tonny Branch, MD;  Location: [facility];  Service: Ophthalmology;  Laterality: Left;  CDE: 8.90  . ERCP N/A 10/03/2020   Procedure: ENDOSCOPIC RETROGRADE CHOLANGIOPANCREATOGRAPHY (ERCP);  Surgeon: Arta Silence, MD;  Location: [facility] ENDOSCOPY;  Service: Endoscopy;  Laterality: N/A;  . LEFT HEART CATH AND CORONARY ANGIOGRAPHY N/A  07/27/2017   Procedure: LEFT HEART CATH AND CORONARY ANGIOGRAPHY;  Surgeon: Wellington Hampshire, MD;  Location: [place] [facility] LAB;  Service: Cardiovascular;  Laterality: N/A;  . REMOVAL OF STONES  10/03/2020   Procedure: REMOVAL OF STONES;  Surgeon: Arta Silence, MD;  Location: [facility] ENDOSCOPY;  Service: Endoscopy;;  . SPHINCTEROTOMY  10/03/2020   Procedure: SPHINCTEROTOMY;  Surgeon: Arta Silence, MD;  Location: [facility] ENDOSCOPY;  Service: Endoscopy;;   HPI:  Pt is a 86 y.o. female with medical history significant of osteoarthritis, CAD, history of MI, chronic diastolic heart failure, dementia, type 2 diabetes, hyperlipidemia, dyspnea on exertion, hypertension, skin cancer, cholelithiasis with acute cholestatic hepatitis. She was admitted 11/10-11/15 and underwent ERCP and removal of her CBD stone and is now admitted from the SNF due to fall. CXR and CT head were negative for acute changes. Pt found to have left femoral neck fracture and is s/p   cannulated screw fixation of left femoral fracture.    Assessment / Plan / Recommendation Clinical Impression  Pt was seen for bedside swallow evaluation. She did not provide any meaningful history or consistently follow commands. Oral mechanism exam was not conducted due to pt's difficulty following commands and she did not allow thorough oral inspection. Limited mandibular dentition was noted and pt stated, "I can't chew; I ain't got no teeth". Mastication was prolonged with solids and pt ultimately spat out all boluses which required mastication. A single cough was noted following prolonged mastication of french toast; aspiration before deglutition secondary to premature spillage is therefore suspected. No s/sx of aspiration were noted with other consistencies and  her tolerance of puree solids appeared functional, but cueing was needed for acceptance of boluses. A dysphagia 1 (puree) diet with thin liquids is recommended at this time and SLP will follow to assess  tolerance and for possible diet advancement.  SLP Visit Diagnosis: Dysphagia, unspecified (R13.10)    Aspiration Risk  Mild aspiration risk    Diet Recommendation Dysphagia 1 (Puree);Thin liquid   Liquid Administration via: Cup;Straw Medication Administration: Crushed with puree Supervision: Staff to assist with self feeding Compensations: Minimize environmental distractions;Small sips/bites Postural Changes: Seated upright at 90 degrees    Other  Recommendations Oral Care Recommendations: Oral care BID   Follow up Recommendations None      Frequency and Duration min 2x/week  1 week       Prognosis Prognosis for Safe Diet Advancement: Fair Barriers to Reach Goals: Cognitive deficits      Swallow Study   General Date of Onset: 10/14/20 HPI: Pt is a 86 y.o. female with medical history significant of osteoarthritis, CAD, history of MI, chronic diastolic heart failure, dementia, type 2 diabetes, hyperlipidemia, dyspnea on exertion, hypertension, skin cancer, cholelithiasis with acute cholestatic hepatitis. She was admitted 11/10-11/15 and underwent ERCP and removal of her CBD stone and is now admitted from the SNF due to fall. CXR and CT head were negative for acute changes. Pt found to have left femoral neck fracture and is s/p   cannulated screw fixation of left femoral fracture.  Type of Study: Bedside Swallow Evaluation Previous Swallow Assessment: None Diet Prior to this Study: Regular;Thin liquids Temperature Spikes Noted: No Respiratory Status: Room air History of Recent Intubation: No Behavior/Cognition: Alert;Cooperative;Pleasant mood Oral Cavity Assessment: Within Functional Limits Oral Care Completed by SLP: No Oral Cavity - Dentition: Missing dentition Vision: Functional for self-feeding Self-Feeding Abilities: Total assist Patient Positioning: Upright in bed;Postural control adequate for testing Baseline Vocal Quality: Normal Volitional Cough: Cognitively unable  to elicit Volitional Swallow: Unable to elicit    Oral/Motor/Sensory Function Overall Oral Motor/Sensory Function: Other (comment) (Pt unable to participate in assessment)   Ice Chips Ice chips: Not tested   Thin Liquid Thin Liquid: Within functional limits Presentation: Straw    Nectar Thick Nectar Thick Liquid: Not tested   Honey Thick Honey Thick Liquid: Not tested   Puree Puree: Within functional limits Presentation: Spoon   Solid     Solid: Impaired Presentation: Spoon Oral Phase Impairments: Poor awareness of bolus Oral Phase Functional Implications: Impaired mastication     Benelli Winther I. Hardin Negus, [place], [place] Office number [phone] Pager [phone]  Horton Marshall 10/15/2020,9:28 AM

## 2020-10-16 DIAGNOSIS — W19XXXA Unspecified fall, initial encounter: Secondary | ICD-10-CM | POA: Diagnosis not present

## 2020-10-16 DIAGNOSIS — I5032 Chronic diastolic (congestive) heart failure: Secondary | ICD-10-CM | POA: Diagnosis not present

## 2020-10-16 DIAGNOSIS — E119 Type 2 diabetes mellitus without complications: Secondary | ICD-10-CM | POA: Diagnosis not present

## 2020-10-16 DIAGNOSIS — Z66 Do not resuscitate: Secondary | ICD-10-CM

## 2020-10-16 DIAGNOSIS — Z419 Encounter for procedure for purposes other than remedying health state, unspecified: Secondary | ICD-10-CM | POA: Diagnosis not present

## 2020-10-16 DIAGNOSIS — S72002A Fracture of unspecified part of neck of left femur, initial encounter for closed fracture: Secondary | ICD-10-CM | POA: Diagnosis not present

## 2020-10-16 DIAGNOSIS — F0391 Unspecified dementia with behavioral disturbance: Secondary | ICD-10-CM | POA: Diagnosis not present

## 2020-10-16 LAB — BASIC METABOLIC PANEL
Anion gap: 10 (ref 5–15)
BUN: 10 mg/dL (ref 8–23)
CO2: 25 mmol/L (ref 22–32)
Calcium: 8.8 mg/dL — ABNORMAL LOW (ref 8.9–10.3)
Chloride: 106 mmol/L (ref 98–111)
Creatinine, Ser: 1.01 mg/dL — ABNORMAL HIGH (ref 0.44–1.00)
GFR, Estimated: 53 mL/min — ABNORMAL LOW (ref >60)
Glucose, Bld: 160 mg/dL — ABNORMAL HIGH (ref 70–99)
Potassium: 4.4 mmol/L (ref 3.5–5.1)
Sodium: 141 mmol/L (ref 135–145)

## 2020-10-16 LAB — CBC
HCT: 27.4 % — ABNORMAL LOW (ref 36.0–46.0)
Hemoglobin: 8.6 g/dL — ABNORMAL LOW (ref 12.0–15.0)
MCH: 31 pg (ref 26.0–34.0)
MCHC: 31.4 g/dL (ref 30.0–36.0)
MCV: 98.9 fL (ref 80.0–100.0)
Platelets: 150 10*3/uL (ref 150–400)
RBC: 2.77 MIL/uL — ABNORMAL LOW (ref 3.87–5.11)
RDW: 23.4 % — ABNORMAL HIGH (ref 11.5–15.5)
WBC: 10.9 10*3/uL — ABNORMAL HIGH (ref 4.0–10.5)
nRBC: 0 % (ref 0.0–0.2)

## 2020-10-16 LAB — GLUCOSE, CAPILLARY
Glucose-Capillary: 100 mg/dL — ABNORMAL HIGH (ref 70–99)
Glucose-Capillary: 114 mg/dL — ABNORMAL HIGH (ref 70–99)
Glucose-Capillary: 128 mg/dL — ABNORMAL HIGH (ref 70–99)
Glucose-Capillary: 139 mg/dL — ABNORMAL HIGH (ref 70–99)
Glucose-Capillary: 149 mg/dL — ABNORMAL HIGH (ref 70–99)
Glucose-Capillary: 149 mg/dL — ABNORMAL HIGH (ref 70–99)
Glucose-Capillary: 152 mg/dL — ABNORMAL HIGH (ref 70–99)

## 2020-10-16 MED ORDER — SODIUM CHLORIDE 0.9 % IV SOLN: INTRAVENOUS | Status: AC

## 2020-10-16 NOTE — Progress Notes (Signed)
[facility] PROGRESS NOTE    Progress Note  Robin Guzman  FKC:[id number] DOB: 28-May-1928 DOA: 10/13/2020 PCP: Celene Squibb, MD     Brief Narrative:   Robin Guzman is an 86 y.o. female past medical history significant osteoarthritis, CAD with a history of an MI, chronic diastolic heart failure, dementia diabetes mellitus type 2, essential hypertension, with history of choledocholithiasis admitted on November 10 who underwent ERCP with removal of CBD stone coming back from the nursing home due to having a fall. To have a low closed left hip fracture and transferred to [facility].  Assessment/Plan:   Acute confusional state superimposed on advanced dementia: She is constantly taking her telemetry off will discontinued continue Seroquel at bedtime Haldol IV as needed. We will also start melatonin. Continue sitter at bedside.  Closed left hip fracture, initial encounter [facility]) He has been consulted commended surgical intervention and she is status post cannulated fixation of the left femoral neck Physical therapy evaluation is pending. TOC has been consulted he probably needs skilled nursing facility.  Chronic diastolic heart failure ([place]): No signs of decompensation will restart diuretic therapy continue beta-blockers.  Essential hypertension Seems fairly controlled continue current regimen.  Controlled type 2 diabetes mellitus without complication, without long-term current use of insulin ([place]) Continue to hold Metformin, continue CBGs before meals and at bedtime blood glucose fairly controlled.  CAD/dyslipidemia: No longer on Lipitor, continue Coreg.  Paroxysmal atrial tachycardia: Try to keep potassium greater than 4 magnesium greater than 2 ischemic sinus rhythm now.  Microcytic anemia/acute blood loss anemia: CBC, shows a mild drop in hemoglobin from 9-8.8 question normal variation.  Choledocholithiasis: Noted.  Moderate protein caloric malnutrition: Awaiting  nutritionist recommendations.  Mild leukocytosis: White blood cell count is improving, she has remained afebrile likely stress margination from surgery.  Goals of care: Palliative care was consulted the patient continues to be a DNR MOST form was filled by the palliative team and appreciate assistance they she would likely benefit from referral to the outpatient palliative care as an outpatient.   DVT prophylaxis: lovenox Family Communication:Daughter Status is: Inpatient  Remains inpatient appropriate because:Hemodynamically unstable   Dispo: The patient is from: SNF              Anticipated d/c is to: SNF              Anticipated d/c date is: 3 days              Patient currently is not medically stable to d/c.        Code Status:     Code Status Orders  (From admission, onward)         Start     Ordered   10/13/20 2306  Do not attempt resuscitation (DNR)  Continuous       Question Answer Comment  In the event of cardiac or respiratory ARREST Do not call a code blue   In the event of cardiac or respiratory ARREST Do not perform Intubation, CPR, defibrillation or ACLS   In the event of cardiac or respiratory ARREST Use medication by any route, position, wound care, and other measures to relive pain and suffering. May use oxygen, suction and manual treatment of airway obstruction as needed for comfort.      10/13/20 2307        Code Status History    Date Active Date Inactive Code Status Order ID Comments User Context   10/13/2020 2308 10/13/2020 2308 DNR  [id number]  Reubin Milan, MD ED   10/13/2020 1754 10/13/2020 2307 DNR [id number]  Fredia Sorrow, MD ED   10/01/2020 2017 10/06/2020 2127 DNR [id number]  Elgergawy, Silver Huguenin, MD ED   06/23/2020 0011 06/25/2020 1946 Full Code [id number]  Bernadette Hoit, DO ED   07/26/2017 1630 07/29/2017 1751 Full Code [id number]  Lendon Colonel, NP ED   12/16/2013 1107 12/17/2013 1830 Full Code [id number]  Samuella Cota,  MD Inpatient   Advance Care Planning Activity    Advance Directive Documentation     Most Recent Value  Type of Advance Directive Out of facility DNR (pink MOST or yellow form)  Pre-existing out of facility DNR order (yellow form or pink MOST form) --  "MOST" Form in Place? --        IV Access:    Peripheral IV   Procedures and diagnostic studies:   DG C-Arm 1-60 Min  Result Date: 10/14/2020 CLINICAL DATA:  Cannulated screws. EXAM: OPERATIVE left HIP (WITH PELVIS IF PERFORMED) 2 VIEWS TECHNIQUE: Fluoroscopic spot image(s) were submitted for interpretation post-operatively. COMPARISON:  CT October 13, 2020. FINDINGS: Fluoro time: 1 minutes 5 seconds. Two C-arm fluoroscopic images were obtained intraoperatively and submitted for post operative interpretation. These images demonstrate fixation of a left subtrochanteric fracture with cannulated screws. Please see the performing provider's procedural report for further detail. IMPRESSION: Intraoperative fluoroscopic images, as detailed above. Electronically Signed   By: Margaretha Sheffield MD   On: 10/14/2020 16:04   DG Hip Port Unilat With Pelvis 1V Left  Result Date: 10/14/2020 CLINICAL DATA:  Postop EXAM: DG HIP (WITH OR WITHOUT PELVIS) 1V PORT LEFT COMPARISON:  10/13/2020 FINDINGS: Interval 3 screw fixation of proximal left femur for femoral neck fracture. Intact hardware. Near anatomic alignment. IMPRESSION: Interval screw fixation of proximal left femur for femoral neck fracture. Electronically Signed   By: Donavan Foil M.D.   On: 10/14/2020 17:16   DG HIP OPERATIVE UNILAT W OR W/O PELVIS LEFT  Result Date: 10/14/2020 CLINICAL DATA:  Cannulated screws. EXAM: OPERATIVE left HIP (WITH PELVIS IF PERFORMED) 2 VIEWS TECHNIQUE: Fluoroscopic spot image(s) were submitted for interpretation post-operatively. COMPARISON:  CT October 13, 2020. FINDINGS: Fluoro time: 1 minutes 5 seconds. Two C-arm fluoroscopic images were obtained  intraoperatively and submitted for post operative interpretation. These images demonstrate fixation of a left subtrochanteric fracture with cannulated screws. Please see the performing provider's procedural report for further detail. IMPRESSION: Intraoperative fluoroscopic images, as detailed above. Electronically Signed   By: Margaretha Sheffield MD   On: 10/14/2020 16:04     Medical Consultants:    None.  Anti-Infectives:   none  Subjective:    Robin Guzman calm this morning.  Objective:    Vitals:   10/15/20 0920 10/15/20 2017 10/16/20 0026 10/16/20 0414  BP: (!) 148/81 (!) 142/69 (!) 147/72 138/67  Pulse: 78 82 85 92  Resp: 17 15 18 15   Temp: 98.2 F (36.8 C) 98.5 F (36.9 C) 98.2 F (36.8 C) 99.5 F (37.5 C)  TempSrc: Oral Oral Oral Oral  SpO2: 98% 97% 97% 98%  Weight:      Height:       SpO2: 98 % O2 Flow Rate (L/min): 5 L/min   Intake/Output Summary (Last 24 hours) at 10/16/2020 0837 Last data filed at 10/15/2020 1900 Gross per 24 hour  Intake 0 ml  Output --  Net 0 ml   Filed Weights   10/13/20 1646  Weight: 66 kg  Exam: General exam: In no acute distress. Respiratory system: Good air movement and clear to auscultation. Cardiovascular system: S1 & S2 heard, RRR. No JVD. Gastrointestinal system: Abdomen is nondistended, soft and nontender.  Extremities: No pedal edema. Skin: No rashes, lesions or ulcers   Data Reviewed:    Labs: Basic Metabolic Panel: Recent Labs  Lab 10/13/20 1744 10/13/20 1744 10/14/20 1732 10/15/20 0153 10/15/20 1623 10/16/20 0110  NA 137  --   --  140  --  141  K 4.0   < >  --  4.0  --  4.4  CL 106  --   --  105  --  106  CO2 23  --   --  26  --  25  GLUCOSE 160*  --   --  104*  --  160*  BUN 13  --   --  8  --  10  CREATININE 0.96  --  0.88 0.85  --  1.01*  CALCIUM 9.0  --   --  8.8*  --  8.8*  MG  --   --   --   --  1.7  --    < > = values in this interval not displayed.   GFR Estimated Creatinine  Clearance: 34.6 mL/min (A) (by C-G formula based on SCr of 1.01 mg/dL (H)). Liver Function Tests: Recent Labs  Lab 10/13/20 1744 10/15/20 0153  AST 49* 32  ALT 27 19  ALKPHOS 303* 230*  BILITOT 3.6* 2.6*  PROT 5.9* 4.4*  ALBUMIN 2.9* 2.1*   Recent Labs  Lab 10/13/20 1744  LIPASE 27   No results for input(s): AMMONIA in the last 168 hours. Coagulation profile No results for input(s): INR, PROTIME in the last 168 hours. COVID-19 Labs  No results for input(s): DDIMER, FERRITIN, LDH, CRP in the last 72 hours.  Lab Results  Component Value Date   SARSCOV2NAA NEGATIVE 10/13/2020   [place] NEGATIVE 10/01/2020   [place] NEGATIVE 06/24/2020   [place] NEGATIVE 06/22/2020    CBC: Recent Labs  Lab 10/13/20 1744 10/14/20 1732 10/15/20 0153 10/16/20 0110  WBC 10.7* 10.5 12.0* 10.9*  HGB 9.8* 9.2* 8.8* 8.6*  HCT 32.5* 29.4* 28.2* 27.4*  MCV 102.5* 99.7 99.6 98.9  PLT 204 143* 131* 150   Cardiac Enzymes: No results for input(s): CKTOTAL, CKMB, CKMBINDEX, TROPONINI in the last 168 hours. BNP (last 3 results) No results for input(s): PROBNP in the last 8760 hours. CBG: Recent Labs  Lab 10/15/20 1615 10/15/20 2036 10/16/20 0019 10/16/20 0406 10/16/20 0741  GLUCAP 122* 128* 149* 139* 149*   D-Dimer: No results for input(s): DDIMER in the last 72 hours. Hgb A1c: No results for input(s): HGBA1C in the last 72 hours. Lipid Profile: No results for input(s): CHOL, HDL, LDLCALC, TRIG, CHOLHDL, LDLDIRECT in the last 72 hours. Thyroid function studies: No results for input(s): TSH, T4TOTAL, T3FREE, THYROIDAB in the last 72 hours.  Invalid input(s): FREET3 Anemia work up: No results for input(s): VITAMINB12, FOLATE, FERRITIN, TIBC, IRON, RETICCTPCT in the last 72 hours. Sepsis Labs: Recent Labs  Lab 10/13/20 1744 10/14/20 1732 10/15/20 0153 10/16/20 0110  WBC 10.7* 10.5 12.0* 10.9*   Microbiology Recent Results (from the past 240 hour(s))  Resp Panel  by RT-PCR (Flu A&B, Covid) Nasopharyngeal Swab     Status: None   Collection Time: 10/13/20 10:31 PM   Specimen: Nasopharyngeal Swab; Nasopharyngeal(NP) swabs in vial transport medium  Result Value Ref Range Status   SARS Coronavirus 2  by RT PCR NEGATIVE NEGATIVE Final    Comment: (NOTE) SARS-CoV-2 target nucleic acids are NOT DETECTED.  The SARS-CoV-2 RNA is generally detectable in upper respiratory specimens during the acute phase of infection. The lowest concentration of SARS-CoV-2 viral copies this assay can detect is 138 copies/mL. A negative result does not preclude SARS-Cov-2 infection and should not be used as the sole basis for treatment or other patient management decisions. A negative result may occur with  improper specimen collection/handling, submission of specimen other than nasopharyngeal swab, presence of viral mutation(s) within the areas targeted by this assay, and inadequate number of viral copies(<138 copies/mL). A negative result must be combined with clinical observations, patient history, and epidemiological information. The expected result is Negative.  Fact Sheet for Patients:  EntrepreneurPulse.com.au  Fact Sheet for Healthcare Providers:  IncredibleEmployment.be  This test is no t yet approved or cleared by the [place] FDA and  has been authorized for detection and/or diagnosis of SARS-CoV-2 by FDA under an Emergency Use Authorization (EUA). This EUA will remain  in effect (meaning this test can be used) for the duration of the COVID-19 declaration under Section 564(b)(1) of the Act, 21 U.S.C.section 360bbb-3(b)(1), unless the authorization is terminated  or revoked sooner.       Influenza A by PCR NEGATIVE NEGATIVE Final   Influenza B by PCR NEGATIVE NEGATIVE Final    Comment: (NOTE) The Xpert Xpress SARS-CoV-2/FLU/RSV plus assay is intended as an aid in the diagnosis of influenza from Nasopharyngeal swab  specimens and should not be used as a sole basis for treatment. Nasal washings and aspirates are unacceptable for Xpert Xpress SARS-CoV-2/FLU/RSV testing.  Fact Sheet for Patients: EntrepreneurPulse.com.au  Fact Sheet for Healthcare Providers: IncredibleEmployment.be  This test is not yet approved or cleared by the [place] FDA and has been authorized for detection and/or diagnosis of SARS-CoV-2 by FDA under an Emergency Use Authorization (EUA). This EUA will remain in effect (meaning this test can be used) for the duration of the COVID-19 declaration under Section 564(b)(1) of the Act, 21 U.S.C. section 360bbb-3(b)(1), unless the authorization is terminated or revoked.  Performed at [facility], [address] [place], [place] [address]      Medications:    vitamin C  500 mg Oral Daily   carvedilol  25 mg Oral BID WC   cholecalciferol  2,000 Units Oral BID   docusate sodium  100 mg Oral BID   enoxaparin (LOVENOX) injection  40 mg Subcutaneous Q24H   famotidine  20 mg Oral BID   feeding supplement  237 mL Oral BID BM   insulin aspart  0-9 Units Subcutaneous Q4H   magnesium oxide  200 mg Oral BID   melatonin  3 mg Oral QHS   multivitamin with minerals  1 tablet Oral Daily   pantoprazole  40 mg Oral BID   polyethylene glycol  17 g Oral Daily   senna-docusate  2 tablet Oral BID   Continuous Infusions:    LOS: 3 days   Charlynne Cousins  [facility]  10/16/2020, 8:37 AM

## 2020-10-16 NOTE — Progress Notes (Signed)
[facility] Progress Note  Patient ID: Robin Guzman MRN: [id number] DOB/AGE: [birth year] 86 y.o.  Subjective:  Sleeping Sitter in room, reports no current issues   ROS As above  Objective:   VITALS:   Vitals:   10/15/20 2017 10/16/20 0026 10/16/20 0414 10/16/20 0839  BP: (!) 142/69 (!) 147/72 138/67 132/67  Pulse: 82 85 92 86  Resp: 15 18 15 17   Temp: 98.5 F (36.9 C) 98.2 F (36.8 C) 99.5 F (37.5 C) 98 F (36.7 C)  TempSrc: Oral Oral Oral Oral  SpO2: 97% 97% 98% 100%  Weight:      Height:        Estimated body mass index is 24.98 kg/m as calculated from the following:   Height as of this encounter: 5\' 4"  (1.626 m).   Weight as of this encounter: 66 kg.   Intake/Output      11/24 0701 - 11/25 0700 11/25 0701 - 11/26 0700   P.O. 0 60   I.V. (mL/kg)     IV Piggyback     Total Intake(mL/kg) 0 (0) 60 (0.9)   Urine (mL/kg/hr)     Blood     Total Output     Net 0 +60        Urine Occurrence  1 x     LABS  Results for orders placed or performed during the hospital encounter of 10/13/20 (from the past 24 hour(s))  Glucose, capillary     Status: Abnormal   Collection Time: 10/15/20  4:15 PM  Result Value Ref Range   Glucose-Capillary 122 (H) 70 - 99 mg/dL  Magnesium     Status: None   Collection Time: 10/15/20  4:23 PM  Result Value Ref Range   Magnesium 1.7 1.7 - 2.4 mg/dL  Glucose, capillary     Status: Abnormal   Collection Time: 10/15/20  8:36 PM  Result Value Ref Range   Glucose-Capillary 128 (H) 70 - 99 mg/dL  Glucose, capillary     Status: Abnormal   Collection Time: 10/16/20 12:19 AM  Result Value Ref Range   Glucose-Capillary 149 (H) 70 - 99 mg/dL  CBC     Status: Abnormal   Collection Time: 10/16/20  1:10 AM  Result Value Ref Range   WBC 10.9 (H) 4.0 - 10.5 K/uL   RBC 2.77 (L) 3.87 - 5.11 MIL/uL   Hemoglobin 8.6 (L) 12.0 - 15.0 g/dL   HCT 27.4  (L) 36 - 46 %   MCV 98.9 80.0 - 100.0 fL   MCH 31.0 26.0 - 34.0 pg   MCHC 31.4 30.0 - 36.0 g/dL   RDW 23.4 (H) 11.5 - 15.5 %   Platelets 150 150 - 400 K/uL   nRBC 0.0 0.0 - 0.2 %  Basic metabolic panel     Status: Abnormal   Collection Time: 10/16/20  1:10 AM  Result Value Ref Range   Sodium 141 135 - 145 mmol/L   Potassium 4.4 3.5 - 5.1 mmol/L   Chloride 106 98 - 111 mmol/L   CO2 25 22 - 32 mmol/L   Glucose, Bld 160 (H) 70 - 99 mg/dL   BUN 10 8 - 23 mg/dL   Creatinine, Ser 1.01 (H) 0.44 - 1.00 mg/dL   Calcium 8.8 (L) 8.9 - 10.3 mg/dL  GFR, Estimated 53 (L) >60 mL/min   Anion gap 10 5 - 15  Glucose, capillary     Status: Abnormal   Collection Time: 10/16/20  4:06 AM  Result Value Ref Range   Glucose-Capillary 139 (H) 70 - 99 mg/dL  Glucose, capillary     Status: Abnormal   Collection Time: 10/16/20  7:41 AM  Result Value Ref Range   Glucose-Capillary 149 (H) 70 - 99 mg/dL     PHYSICAL EXAM:   Gen: in bed, sleeping  Ext:       Left Lower Extremities             L hip dressing c/d/i             Ext warm              Skin tear L leg stable              Ecchymosis to L leg stable                         Small blisters stable              Ext warm              + DP pulse    Assessment/Plan: 2 Days Post-Op   Principal Problem:   Closed left hip fracture, initial encounter (HCC) Active Problems:   Chronic diastolic heart failure (HCC)   Essential hypertension   Controlled type 2 diabetes mellitus without complication, without long-term current use of insulin (HCC)   Hypercholesteremia   CAD (coronary artery disease)   PAT (paroxysmal atrial tachycardia) (HCC)   Macrocytic anemia   Fall   Choledocholithiasis   Moderate protein malnutrition (HCC)   Dementia with behavioral disturbance (HCC)   Surgery, elective   Palliative care by specialist   Goals of care, counseling/discussion   Anti-infectives (From admission, onward)   Start     Dose/Rate Route  Frequency Ordered Stop   10/14/20 2000  ceFAZolin (ANCEF) IVPB 2g/100 mL premix        2 g 200 mL/hr over 30 Minutes Intravenous Every 6 hours 10/14/20 1702 10/15/20 0230   10/14/20 1136  ceFAZolin (ANCEF) 2-4 GM/100ML-% IVPB       Note to Pharmacy: Henrine Screws   : cabinet override      10/14/20 1136 10/14/20 1337   10/14/20 1130  ceFAZolin (ANCEF) IVPB 2g/100 mL premix        2 g 200 mL/hr over 30 Minutes Intravenous On call to O.R. 10/14/20 1124 10/14/20 1356    .  POD/HD#: 2  86 y/o female s/p ground level fall with valgus impacted L femoral neck fracture    -L femoral neck fracture s/p closed reduction and percutaneous pinning             PWB L LEx (50%)                         Follow as best as possible. Will be difficult given dementia                          May need to use WC              ROM as tolerated L hip              PT/OT  Dressing changes as needed   - L leg skin tears             Local wound care     - Pain management:             Tylenol              Minimize narcotics    - ABL anemia/Hemodynamics             Monitor   Appears stable  Cbc in am    - Medical issues              Per primary    - DVT/PE prophylaxis:             Lovenox while inpatient              SCDs   - ID:              periop abx   - Metabolic Bone Disease:             Vitamin d levels low normal                         Supplement             Fracture indicative of osteoporosis              Fracture liaison consult    - Impediments to fracture healing:             Osteoporosis             Dementia    - Dispo:             Ortho issues stable             Follow up with ortho in 10-14 days    Jari Pigg, PA-C [phone] (C) 10/16/2020, 11:39 AM  [facility] [place] [address] [phone] Jenetta [phone] (F)    After 5pm and on the weekends please log on to Amion, go to orthopaedics and the look under  the [facility] Call for the provider(s) on call. You can also call our office at [phone] and then follow the prompts to be connected to the call team.

## 2020-10-16 NOTE — Plan of Care (Signed)

## 2020-10-16 NOTE — Progress Notes (Signed)
Daily Progress Note   Patient Name: Robin Guzman       Date: 10/16/2020 DOB: 1928-03-25  Age: 86 y.o. MRN#: [id number] Attending Physician: Charlynne Cousins, MD Primary Care Physician: Celene Squibb, MD Admit Date: 10/13/2020  Reason for Consultation/Follow-up: Establishing goals of care  Subjective: Patient awake and alert but with pleasant confusion. Baseline dementia. Safety sitter at bedside. Reports 10% of breakfast. Patient took pills this morning without difficulty. Remains intermittently restless and pulling at lines. No s/s of pain or discomfort.   GOC:  F/u [employer] discussion with patient's three children via telephone [name], [place], and [place]).   Reviewed course of hospitalization including diagnoses, interventions, plan of care. Reviewed disease trajectory of dementia. Three children agree that she needs higher level of care following hospitalization. Would like to attempt SNF rehab but also acknowledge this will have to become a long-term care option. Reassured family that [facility] will assist with discharge.   Three children are ready to complete electronic MOST form today while they are all together. Reviewed and completed MOST form. Decisions include: DNR/DNI, limited additional interventions including BiPAP/CPAP if indicated, rehospitalization if indicated, IVF/ABX if indicated, and NO feeding tube. Durable DNR completed. Copies left for family in chart. Pink MOST form completed but daughter signed electronic Vynca MOST form since family did not wish to meet in person. Limmie Patricia, and Roger all agree on these decisions.   Answered questions. PMT contact information given.   Length of Stay: 3  Current Medications: Scheduled Meds:  . vitamin C  500 mg Oral Daily  .  carvedilol  25 mg Oral BID WC  . cholecalciferol  2,000 Units Oral BID  . docusate sodium  100 mg Oral BID  . enoxaparin (LOVENOX) injection  40 mg Subcutaneous Q24H  . famotidine  20 mg Oral BID  . feeding supplement  237 mL Oral BID BM  . insulin aspart  0-9 Units Subcutaneous Q4H  . magnesium oxide  200 mg Oral BID  . melatonin  3 mg Oral QHS  . multivitamin with minerals  1 tablet Oral Daily  . pantoprazole  40 mg Oral BID  . polyethylene glycol  17 g Oral Daily  . senna-docusate  2 tablet Oral BID    Continuous Infusions: . sodium chloride 75 mL/hr at 10/16/20 [phone]  PRN Meds: acetaminophen **OR** acetaminophen, haloperidol lactate, HYDROmorphone (DILAUDID) injection, menthol-cetylpyridinium **OR** phenol, metoCLOPramide **OR** metoCLOPramide (REGLAN) injection, ondansetron **OR** ondansetron (ZOFRAN) IV, QUEtiapine  Physical Exam Vitals and nursing note reviewed.  Constitutional:      General: She is awake.  HENT:     Head: Normocephalic and atraumatic.  Cardiovascular:     Heart sounds: Normal heart sounds.  Pulmonary:     Effort: No tachypnea, accessory muscle usage or respiratory distress.  Skin:    General: Skin is warm and dry.  Neurological:     Mental Status: She is alert.     Comments: Awake, disoriented with baseline dementia. Safety sitter at bedside  Psychiatric:        Attention and Perception: She is inattentive.        Speech: Speech is delayed.        Cognition and Memory: Cognition is impaired.            Vital Signs: BP 132/67 (BP Location: Left Arm)   Pulse 86   Temp 98 F (36.7 C) (Oral)   Resp 17   Ht 5\' 4"  (1.626 m)   Wt 66 kg   SpO2 100%   BMI 24.98 kg/m  SpO2: SpO2: 100 % O2 Device: O2 Device: Room Air O2 Flow Rate: O2 Flow Rate (L/min): 5 L/min  Intake/output summary:   Intake/Output Summary (Last 24 hours) at 10/16/2020 1024 Last data filed at 10/16/2020 0263 Gross per 24 hour  Intake 60 ml  Output --  Net 60 ml   LBM:    Baseline Weight: Weight: 66 kg Most recent weight: Weight: 66 kg       Palliative Assessment/Data: PPS 50%    Flowsheet Rows     Most Recent Value  Intake Tab  Referral Department Hospitalist  Unit at Time of Referral Med/Surg Unit  Palliative Care Primary Diagnosis Neurology  Palliative Care Type New Palliative care  Reason for referral Clarify Goals of Care  Date first seen by Palliative Care 10/15/20  Clinical Assessment  Palliative Performance Scale Score 50%  Psychosocial & Spiritual Assessment  Palliative Care Outcomes  Patient/Family meeting held? Yes  Who was at the meeting? daughter  Palliative Care Outcomes Provided psychosocial or spiritual support, ACP counseling assistance, Linked to palliative care logitudinal support, Clarified goals of care      Patient Active Problem List   Diagnosis Date Noted  . Dementia with behavioral disturbance ([place])   . Surgery, elective   . Palliative care by specialist   . Goals of care, counseling/discussion   . Closed left hip fracture, initial encounter ([place]) 10/13/2020  . Moderate protein malnutrition ([place]) 10/13/2020  . Choledocholithiasis 10/01/2020  . Compression fracture of L1 vertebra ([facility]) 06/23/2020  . Leukocytosis 06/23/2020  . Macrocytic anemia 06/23/2020  . Hyperglycemia due to diabetes mellitus ([place]) 06/23/2020  . Thrombocytopenia ([place]) 06/23/2020  . Back pain 06/23/2020  . Fall 06/23/2020  . SVT (supraventricular tachycardia) ([place]) 10/23/2018  . PAT (paroxysmal atrial tachycardia) ([place]) 04/23/2018  . LBBB (left bundle branch block) 04/23/2018  . Dyslipidemia 04/23/2018  . Atherosclerosis of aorta ([place]) 04/23/2018  . Acute systolic heart failure ([place]) 07/28/2017  . Sundowning 07/28/2017  . NSTEMI (non-ST elevated myocardial infarction) ([place]) 07/26/2017  . Closed 3-part fracture of proximal humerus, left, with routine healing, subsequent encounter 12/02/2016  . UTI (urinary tract infection) 12/25/2013  .  Dyspnea 12/25/2013  . Near syncope 12/25/2013  . CAD (coronary artery disease) 12/25/2013  . Chronic diastolic  heart failure ([place]) 08/07/2013  . Essential hypertension 08/07/2013  . Controlled type 2 diabetes mellitus without complication, without long-term current use of insulin ([place]) 08/07/2013  . Hypercholesteremia 08/07/2013    Palliative Care Assessment & Plan   Patient Profile: 86 y.o. female  with past medical history of dementia, osteoarthritis, CAD, MI, chronic diastolic heart failure, DM type 2, essential hypertension, recent hospital admission for choledocholithiasis s/p ERCP and removal of CBD stone admitted on 10/13/2020 with confusion and fall. Imaging revealed closed left hip fracture s/p closed reduction and percutaneous pinning by ortho. Baseline dementia with agitation inpatient requiring HS Seroquel and PRN IV Haldol. Palliative medicine consultation for goals of care.   Assessment: Closed left hip fracture Chronic diastolic heart failure Advanced dementia Essential hypertension Type 2 DM Paroxysmal atrial tachycardia Weakness/deconditioning  Recommendations/Plan:  Electronic Vynca MOST form completed with patient's three children. Decisions include: DNR/DNI, limited additional interventions including non-invasive airway support if indicated, re-hospitalization if indicated, IVF/ABX if indicated, and NO feeding tube. Durable DNR completed. Copies of MOST left in chart for family.   Continue current plan of care and medical management.  Continue PT/OT/SLP efforts.  TOC team. Family requesting SNF rehab on discharge but acknowledge that their mother needs higher level of care and will need long-term care following rehab days.  May benefit from outpatient palliative referral.   Code Status: DNR   Code Status Orders  (From admission, onward)         Start     Ordered   10/13/20 2306  Do not attempt resuscitation (DNR)  Continuous       Question Answer  Comment  In the event of cardiac or respiratory ARREST Do not call a "code blue"   In the event of cardiac or respiratory ARREST Do not perform Intubation, CPR, defibrillation or ACLS   In the event of cardiac or respiratory ARREST Use medication by any route, position, wound care, and other measures to relive pain and suffering. May use oxygen, suction and manual treatment of airway obstruction as needed for comfort.      10/13/20 2307        Code Status History    Date Active Date Inactive Code Status Order ID Comments User Context   10/13/2020 2308 10/13/2020 2308 DNR [id number]  Reubin Milan, MD ED   10/13/2020 1754 10/13/2020 2307 DNR [id number]  Fredia Sorrow, MD ED   10/01/2020 2017 10/06/2020 2127 DNR [id number]  Elgergawy, Silver Huguenin, MD ED   06/23/2020 0011 06/25/2020 1946 Full Code [id number]  Bernadette Hoit, DO ED   07/26/2017 1630 07/29/2017 1751 Full Code [id number]  Lendon Colonel, NP ED   12/16/2013 1107 12/17/2013 1830 Full Code [id number]  Samuella Cota, MD Inpatient   Advance Care Planning Activity    Advance Directive Documentation     Most Recent Value  Type of Advance Directive Out of facility DNR (pink MOST or yellow form)  Pre-existing out of facility DNR order (yellow form or pink MOST form) --  "MOST" Form in Place? --       Prognosis:   Unable to determine  Discharge Planning:  [place] for rehab with Palliative care service follow-up  Care plan was discussed with safety sitter at bedside, family (two daughters and one son)  Thank you for allowing the Palliative Medicine Team to assist in the care of this patient.   Total Time 40 Prolonged Time Billed  no      Greater than 50%  of this time was spent counseling and coordinating care related to the above assessment and plan.  Ihor Dow, DNP, FNP-C Palliative Medicine Team  Phone: [phone] Fax: [phone]  Please contact Palliative Medicine Team phone at [phone]  for questions and concerns.

## 2020-10-17 DIAGNOSIS — S72002A Fracture of unspecified part of neck of left femur, initial encounter for closed fracture: Secondary | ICD-10-CM | POA: Diagnosis not present

## 2020-10-17 DIAGNOSIS — K8050 Calculus of bile duct without cholangitis or cholecystitis without obstruction: Secondary | ICD-10-CM

## 2020-10-17 DIAGNOSIS — I5032 Chronic diastolic (congestive) heart failure: Secondary | ICD-10-CM | POA: Diagnosis not present

## 2020-10-17 DIAGNOSIS — E119 Type 2 diabetes mellitus without complications: Secondary | ICD-10-CM | POA: Diagnosis not present

## 2020-10-17 LAB — VITAMIN B12: Vitamin B-12: 353 pg/mL (ref 180–914)

## 2020-10-17 LAB — FERRITIN: Ferritin: 463 ng/mL — ABNORMAL HIGH (ref 11–307)

## 2020-10-17 LAB — CBC
HCT: 23.7 % — ABNORMAL LOW (ref 36.0–46.0)
Hemoglobin: 7.7 g/dL — ABNORMAL LOW (ref 12.0–15.0)
MCH: 32.5 pg (ref 26.0–34.0)
MCHC: 32.5 g/dL (ref 30.0–36.0)
MCV: 100 fL (ref 80.0–100.0)
Platelets: 121 10*3/uL — ABNORMAL LOW (ref 150–400)
RBC: 2.37 MIL/uL — ABNORMAL LOW (ref 3.87–5.11)
RDW: 23.6 % — ABNORMAL HIGH (ref 11.5–15.5)
WBC: 6.2 10*3/uL (ref 4.0–10.5)
nRBC: 0 % (ref 0.0–0.2)

## 2020-10-17 LAB — RETICULOCYTES
Immature Retic Fract: 22.3 % — ABNORMAL HIGH (ref 2.3–15.9)
RBC.: 2.44 MIL/uL — ABNORMAL LOW (ref 3.87–5.11)
Retic Count, Absolute: 63.2 10*3/uL (ref 19.0–186.0)
Retic Ct Pct: 2.6 % (ref 0.4–3.1)

## 2020-10-17 LAB — GLUCOSE, CAPILLARY
Glucose-Capillary: 126 mg/dL — ABNORMAL HIGH (ref 70–99)
Glucose-Capillary: 199 mg/dL — ABNORMAL HIGH (ref 70–99)
Glucose-Capillary: 103 mg/dL — ABNORMAL HIGH (ref 70–99)
Glucose-Capillary: 122 mg/dL — ABNORMAL HIGH (ref 70–99)
Glucose-Capillary: 138 mg/dL — ABNORMAL HIGH (ref 70–99)

## 2020-10-17 LAB — FOLATE: Folate: 30.8 ng/mL (ref >5.9)

## 2020-10-17 LAB — IRON AND TIBC
Iron: 42 ug/dL (ref 28–170)
Saturation Ratios: 31 % (ref 10.4–31.8)
TIBC: 134 ug/dL — ABNORMAL LOW (ref 250–450)
UIBC: 92 ug/dL

## 2020-10-17 MED ORDER — ACETAMINOPHEN 325 MG PO TABS
325.0000 mg | ORAL_TABLET | Freq: Four times a day (QID) | ORAL | Status: AC | PRN
Start: 2020-10-17 — End: ?

## 2020-10-17 MED ORDER — VITAMIN D3 25 MCG PO TABS: 2000.0000 [IU] | ORAL_TABLET | Freq: Every day | ORAL | Status: AC

## 2020-10-17 MED ORDER — ASCORBIC ACID 500 MG PO TABS: 500.0000 mg | ORAL_TABLET | Freq: Every day | ORAL | Status: AC

## 2020-10-17 NOTE — Progress Notes (Signed)
[facility] PROGRESS NOTE    Progress Note  Robin Guzman  ZOX:[id number] DOB: [birth year] DOA: 10/13/2020 PCP: Celene Squibb, MD     Brief Narrative:   Robin Guzman is an 86 y.o. female past medical history significant osteoarthritis, CAD with a history of an MI, chronic diastolic heart failure, dementia diabetes mellitus type 2, essential hypertension, with history of choledocholithiasis admitted on November 10 who underwent ERCP with removal of CBD stone coming back from the nursing home due to having a fall. To have a low closed left hip fracture and transferred to [facility] had fixation of the left hip.   She is awaiting skilled nursing facility placement.  Assessment/Plan:   Acute confusional state superimposed on advanced dementia: Patient is more calm continue current regimen of Seroquel and melatonin at bedtime Haldol IV as needed for agitation. Keep sitter at bedside.  Closed left hip fracture, initial encounter [facility]) Orthopedic surgery was consulted to perform cannulated fixation of the left femoral neck Physical and occupational therapy were consulted recommended skilled nursing facility [facility] has been consulted he probably needs skilled nursing facility.  Chronic diastolic heart failure ([place]): No signs of decompensation will restart diuretic therapy continue beta-blockers.  Essential hypertension Seems fairly controlled continue current regimen.  Controlled type 2 diabetes mellitus without complication, without long-term current use of insulin ([place]) Continue to hold Metformin, continue CBGs before meals and at bedtime blood glucose fairly controlled.  CAD/dyslipidemia: No longer on Lipitor, continue Coreg.  Paroxysmal atrial tachycardia: Try to keep potassium greater than 4 magnesium greater than 2 ischemic sinus rhythm now.  Microcytic anemia/acute blood loss anemia: Hemoglobin this morning is 7.7 no signs of overt bleeding. Recheck a CBC tomorrow morning  she is currently asymptomatic. Check an anemia panel.  Choledocholithiasis: Noted.  Moderate protein caloric malnutrition: Awaiting nutritionist recommendations.  Mild leukocytosis: White blood cell count is improving, she has remained afebrile likely stress margination from surgery.  Goals of care: Palliative care was consulted the patient continues to be a DNR MOST form was filled by the palliative team and appreciate assistance they she would likely benefit from referral to the outpatient palliative care as an outpatient.   DVT prophylaxis: lovenox Family Communication:Daughter Status is: Inpatient  Remains inpatient appropriate because:Hemodynamically unstable   Dispo: The patient is from: SNF              Anticipated d/c is to: SNF              Anticipated d/c date is: 3 days              Patient currently is not medically stable to d/c.  Awaiting skilled nursing facility placement.        Code Status:     Code Status Orders  (From admission, onward)         Start     Ordered   10/13/20 2306  Do not attempt resuscitation (DNR)  Continuous       Question Answer Comment  In the event of cardiac or respiratory ARREST Do not call a "code blue"   In the event of cardiac or respiratory ARREST Do not perform Intubation, CPR, defibrillation or ACLS   In the event of cardiac or respiratory ARREST Use medication by any route, position, wound care, and other measures to relive pain and suffering. May use oxygen, suction and manual treatment of airway obstruction as needed for comfort.      10/13/20 2307  Code Status History    Date Active Date Inactive Code Status Order ID Comments User Context   10/13/2020 2308 10/13/2020 2308 DNR [id number]  Reubin Milan, MD ED   10/13/2020 1754 10/13/2020 2307 DNR [id number]  Fredia Sorrow, MD ED   10/01/2020 2017 10/06/2020 2127 DNR [id number]  Elgergawy, Silver Huguenin, MD ED   06/23/2020 0011 06/25/2020 1946 Full Code  [id number]  Bernadette Hoit, DO ED   07/26/2017 1630 07/29/2017 1751 Full Code [id number]  Lendon Colonel, NP ED   12/16/2013 1107 12/17/2013 1830 Full Code [id number]  Samuella Cota, MD Inpatient   Advance Care Planning Activity    Advance Directive Documentation     Most Recent Value  Type of Advance Directive Out of facility DNR (pink MOST or yellow form)  Pre-existing out of facility DNR order (yellow form or pink MOST form) --  "MOST" Form in Place? --        IV Access:    Peripheral IV   Procedures and diagnostic studies:   No results found.   Medical Consultants:    None.  Anti-Infectives:   none  Subjective:    [name] calm this morning.  Objective:    Vitals:   10/16/20 1402 10/16/20 2006 10/17/20 0431 10/17/20 0731  BP: 129/70 114/63 111/69 120/64  Pulse: 69 72 70 75  Resp: 16 17 16 16   Temp: 97.8 F (36.6 C) 97.8 F (36.6 C) 97.8 F (36.6 C) 98 F (36.7 C)  TempSrc: Oral Oral Oral Oral  SpO2: 99% 96% 97% 98%  Weight:      Height:       SpO2: 98 % O2 Flow Rate (L/min): 5 L/min   Intake/Output Summary (Last 24 hours) at 10/17/2020 0851 Last data filed at 10/17/2020 0759 Gross per 24 hour  Intake 840.14 ml  Output --  Net 840.14 ml   Filed Weights   10/13/20 1646  Weight: 66 kg    Exam: General exam: In no acute distress. Respiratory system: Good air movement and clear to auscultation. Cardiovascular system: S1 & S2 heard, RRR. No JVD. Gastrointestinal system: Abdomen is nondistended, soft and nontender.  Extremities: No pedal edema. Skin: No rashes, lesions or ulcers   Data Reviewed:    Labs: Basic Metabolic Panel: Recent Labs  Lab 10/13/20 1744 10/13/20 1744 10/14/20 1732 10/15/20 0153 10/15/20 1623 10/16/20 0110  NA 137  --   --  140  --  141  K 4.0   < >  --  4.0  --  4.4  CL 106  --   --  105  --  106  CO2 23  --   --  26  --  25  GLUCOSE 160*  --   --  104*  --  160*  BUN 13  --   --  8  --   10  CREATININE 0.96  --  0.88 0.85  --  1.01*  CALCIUM 9.0  --   --  8.8*  --  8.8*  MG  --   --   --   --  1.7  --    < > = values in this interval not displayed.   GFR Estimated Creatinine Clearance: 34.6 mL/min (A) (by C-G formula based on SCr of 1.01 mg/dL (H)). Liver Function Tests: Recent Labs  Lab 10/13/20 1744 10/15/20 0153  AST 49* 32  ALT 27 19  ALKPHOS 303* 230*  BILITOT 3.6* 2.6*  PROT 5.9* 4.4*  ALBUMIN 2.9* 2.1*   Recent Labs  Lab 10/13/20 1744  LIPASE 27   No results for input(s): AMMONIA in the last 168 hours. Coagulation profile No results for input(s): INR, PROTIME in the last 168 hours. COVID-19 Labs  No results for input(s): DDIMER, FERRITIN, LDH, CRP in the last 72 hours.  Lab Results  Component Value Date   SARSCOV2NAA NEGATIVE 10/13/2020   [place] NEGATIVE 10/01/2020   [place] NEGATIVE 06/24/2020   [place] NEGATIVE 06/22/2020    CBC: Recent Labs  Lab 10/13/20 1744 10/14/20 1732 10/15/20 0153 10/16/20 0110 10/17/20 0328  WBC 10.7* 10.5 12.0* 10.9* 6.2  HGB 9.8* 9.2* 8.8* 8.6* 7.7*  HCT 32.5* 29.4* 28.2* 27.4* 23.7*  MCV 102.5* 99.7 99.6 98.9 100.0  PLT 204 143* 131* 150 121*   Cardiac Enzymes: No results for input(s): CKTOTAL, CKMB, CKMBINDEX, TROPONINI in the last 168 hours. BNP (last 3 results) No results for input(s): PROBNP in the last 8760 hours. CBG: Recent Labs  Lab 10/16/20 1605 10/16/20 2008 10/16/20 2355 10/17/20 0403 10/17/20 0812  GLUCAP 128* 152* 114* 126* 199*   D-Dimer: No results for input(s): DDIMER in the last 72 hours. Hgb A1c: No results for input(s): HGBA1C in the last 72 hours. Lipid Profile: No results for input(s): CHOL, HDL, LDLCALC, TRIG, CHOLHDL, LDLDIRECT in the last 72 hours. Thyroid function studies: No results for input(s): TSH, T4TOTAL, T3FREE, THYROIDAB in the last 72 hours.  Invalid input(s): FREET3 Anemia work up: No results for input(s): VITAMINB12, FOLATE, FERRITIN,  TIBC, IRON, RETICCTPCT in the last 72 hours. Sepsis Labs: Recent Labs  Lab 10/14/20 1732 10/15/20 0153 10/16/20 0110 10/17/20 0328  WBC 10.5 12.0* 10.9* 6.2   Microbiology Recent Results (from the past 240 hour(s))  Resp Panel by RT-PCR (Flu A&B, Covid) Nasopharyngeal Swab     Status: None   Collection Time: 10/13/20 10:31 PM   Specimen: Nasopharyngeal Swab; Nasopharyngeal(NP) swabs in vial transport medium  Result Value Ref Range Status   SARS Coronavirus 2 by RT PCR NEGATIVE NEGATIVE Final    Comment: (NOTE) SARS-CoV-2 target nucleic acids are NOT DETECTED.  The SARS-CoV-2 RNA is generally detectable in upper respiratory specimens during the acute phase of infection. The lowest concentration of SARS-CoV-2 viral copies this assay can detect is 138 copies/mL. A negative result does not preclude SARS-Cov-2 infection and should not be used as the sole basis for treatment or other patient management decisions. A negative result may occur with  improper specimen collection/handling, submission of specimen other than nasopharyngeal swab, presence of viral mutation(s) within the areas targeted by this assay, and inadequate number of viral copies(<138 copies/mL). A negative result must be combined with clinical observations, patient history, and epidemiological information. The expected result is Negative.  Fact Sheet for Patients:  EntrepreneurPulse.com.au  Fact Sheet for Healthcare Providers:  IncredibleEmployment.be  This test is no t yet approved or cleared by the [place] FDA and  has been authorized for detection and/or diagnosis of SARS-CoV-2 by FDA under an Emergency Use Authorization (EUA). This EUA will remain  in effect (meaning this test can be used) for the duration of the COVID-19 declaration under Section 564(b)(1) of the Act, 21 U.S.C.section 360bbb-3(b)(1), unless the authorization is terminated  or revoked sooner.        Influenza A by PCR NEGATIVE NEGATIVE Final   Influenza B by PCR NEGATIVE NEGATIVE Final    Comment: (NOTE) The Xpert Xpress SARS-CoV-2/FLU/RSV plus assay is intended as an aid in the diagnosis  of influenza from Nasopharyngeal swab specimens and should not be used as a sole basis for treatment. Nasal washings and aspirates are unacceptable for Xpert Xpress SARS-CoV-2/FLU/RSV testing.  Fact Sheet for Patients: EntrepreneurPulse.com.au  Fact Sheet for Healthcare Providers: IncredibleEmployment.be  This test is not yet approved or cleared by the [place] FDA and has been authorized for detection and/or diagnosis of SARS-CoV-2 by FDA under an Emergency Use Authorization (EUA). This EUA will remain in effect (meaning this test can be used) for the duration of the COVID-19 declaration under Section 564(b)(1) of the Act, 21 U.S.C. section 360bbb-3(b)(1), unless the authorization is terminated or revoked.  Performed at [facility], [address], [place], [place] [address]      Medications:   . vitamin C  500 mg Oral Daily  . carvedilol  25 mg Oral BID WC  . cholecalciferol  2,000 Units Oral BID  . docusate sodium  100 mg Oral BID  . enoxaparin (LOVENOX) injection  40 mg Subcutaneous Q24H  . famotidine  20 mg Oral BID  . feeding supplement  237 mL Oral BID BM  . insulin aspart  0-9 Units Subcutaneous Q4H  . magnesium oxide  200 mg Oral BID  . melatonin  3 mg Oral QHS  . multivitamin with minerals  1 tablet Oral Daily  . pantoprazole  40 mg Oral BID  . polyethylene glycol  17 g Oral Daily  . senna-docusate  2 tablet Oral BID   Continuous Infusions: . sodium chloride 75 mL/hr at 10/16/20 2256      LOS: 4 days   Charlynne Cousins  [facility]  10/17/2020, 8:51 AM

## 2020-10-17 NOTE — Plan of Care (Signed)

## 2020-10-17 NOTE — NC FL2 (Signed)
[place] MEDICAID FL2 LEVEL OF CARE SCREENING TOOL     IDENTIFICATION  Patient Name: Robin Guzman Birthdate: [birth year] Sex: female Admission Date (Current Location): 10/13/2020  [facility] and [place] Number:  Herbalist and Address:  [facility]. [facility], [place] [address], [place], [place] [address]      Provider Number: [id number]  Attending Physician Name and Address:  Charlynne Cousins, MD  Relative Name and Phone Number:  Dabbs,Carolyn (Daughter) [phone] [facility])    Current Level of Care: Hospital Recommended Level of Care: [place] Prior Approval Number:    Date Approved/Denied:   PASRR Number: [id number]  Discharge Plan: SNF    Current Diagnoses: Patient Active Problem List   Diagnosis Date Noted  . Dementia with behavioral disturbance ([place])   . Surgery, elective   . Palliative care by specialist   . Goals of care, counseling/discussion   . Closed left hip fracture, initial encounter ([place]) 10/13/2020  . Moderate protein malnutrition ([place]) 10/13/2020  . Choledocholithiasis 10/01/2020  . Compression fracture of L1 vertebra (HCC) 06/23/2020  . Leukocytosis 06/23/2020  . Macrocytic anemia 06/23/2020  . Hyperglycemia due to diabetes mellitus ([place]) 06/23/2020  . Thrombocytopenia ([place]) 06/23/2020  . Back pain 06/23/2020  . Fall 06/23/2020  . SVT (supraventricular tachycardia) ([place]) 10/23/2018  . PAT (paroxysmal atrial tachycardia) ([place]) 04/23/2018  . LBBB (left bundle branch block) 04/23/2018  . Dyslipidemia 04/23/2018  . Atherosclerosis of aorta ([place]) 04/23/2018  . Acute systolic heart failure ([place]) 07/28/2017  . Sundowning 07/28/2017  . NSTEMI (non-ST elevated myocardial infarction) ([place]) 07/26/2017  . Closed 3-part fracture of proximal humerus, left, with routine healing, subsequent encounter 12/02/2016  . UTI (urinary tract infection) 12/25/2013  . Dyspnea 12/25/2013  . Near syncope 12/25/2013  . CAD  (coronary artery disease) 12/25/2013  . Chronic diastolic heart failure ([place]) 08/07/2013  . Essential hypertension 08/07/2013  . Controlled type 2 diabetes mellitus without complication, without long-term current use of insulin ([place]) 08/07/2013  . Hypercholesteremia 08/07/2013    Orientation RESPIRATION BLADDER Height & Weight     Self  Normal Incontinent Weight: 66 kg Height:  5\' 4"  (162.6 cm)  BEHAVIORAL SYMPTOMS/MOOD NEUROLOGICAL BOWEL NUTRITION STATUS      Continent Diet (See discharge summary)  AMBULATORY STATUS COMMUNICATION OF NEEDS Skin   Extensive Assist Verbally Surgical wounds                       Personal Care Assistance Level of Assistance  Bathing, Feeding, Dressing Bathing Assistance: Limited assistance Feeding assistance: Limited assistance Dressing Assistance: Maximum assistance     Functional Limitations Info    Sight Info: Adequate Hearing Info: Impaired Speech Info: Adequate    SPECIAL CARE FACTORS FREQUENCY  PT (By licensed PT), OT (By licensed OT)     PT Frequency: 5 x per week OT Frequency: 5 x per week            Contractures Contractures Info: Not present    Additional Factors Info  Code Status, Allergies, Psychotropic, Insulin Sliding Scale Code Status Info: DNR Allergies Info: Daypro, Ibuprofen Psychotropic Info: Haldol, seroquel Insulin Sliding Scale Info: See discharge summary       Current Medications (10/17/2020):  This is the current hospital active medication list Current Facility-Administered Medications  Medication Dose Route Frequency Provider Last Rate Last Admin  . acetaminophen (TYLENOL) tablet 650 mg  650 mg Oral Q6H PRN Ainsley Spinner, PA-C  Or  . acetaminophen (TYLENOL) suppository 650 mg  650 mg Rectal Q6H PRN Ainsley Spinner, PA-C      . ascorbic acid (VITAMIN C) tablet 500 mg  500 mg Oral Daily Ainsley Spinner, PA-C   500 mg at 10/17/20 9024  . carvedilol (COREG) tablet 25 mg  25 mg Oral BID WC Ainsley Spinner, PA-C    25 mg at 10/17/20 0973  . cholecalciferol (VITAMIN D3) tablet 2,000 Units  2,000 Units Oral BID Ainsley Spinner, PA-C   2,000 Units at 10/17/20 0836  . docusate sodium (COLACE) capsule 100 mg  100 mg Oral BID Ainsley Spinner, PA-C   100 mg at 10/17/20 5329  . enoxaparin (LOVENOX) injection 40 mg  40 mg Subcutaneous Q24H Ainsley Spinner, PA-C   40 mg at 10/17/20 0839  . famotidine (PEPCID) tablet 20 mg  20 mg Oral BID Ainsley Spinner, PA-C   20 mg at 10/17/20 9242  . feeding supplement (ENSURE ENLIVE / ENSURE PLUS) liquid 237 mL  237 mL Oral BID BM Ainsley Spinner, PA-C   237 mL at 10/17/20 0839  . haloperidol lactate (HALDOL) injection 1 mg  1 mg Intravenous Q6H PRN Charlynne Cousins, MD   1 mg at 10/15/20 1336  . insulin aspart (novoLOG) injection 0-9 Units  0-9 Units Subcutaneous Q4H Ainsley Spinner, PA-C   2 Units at 10/17/20 0840  . magnesium oxide (MAG-OX) tablet 200 mg  200 mg Oral BID Ainsley Spinner, PA-C   200 mg at 10/17/20 6834  . melatonin tablet 3 mg  3 mg Oral QHS Charlynne Cousins, MD   3 mg at 10/16/20 2206  . menthol-cetylpyridinium (CEPACOL) lozenge 3 mg  1 lozenge Oral PRN Ainsley Spinner, PA-C      . multivitamin with minerals tablet 1 tablet  1 tablet Oral Daily Ainsley Spinner, PA-C   1 tablet at 10/17/20 1962  . ondansetron (ZOFRAN) tablet 4 mg  4 mg Oral Q6H PRN Ainsley Spinner, PA-C       Or  . ondansetron [facility]) injection 4 mg  4 mg Intravenous Q6H PRN Ainsley Spinner, PA-C      . pantoprazole (PROTONIX) EC tablet 40 mg  40 mg Oral BID Ainsley Spinner, PA-C   40 mg at 10/17/20 0837  . polyethylene glycol (MIRALAX / GLYCOLAX) packet 17 g  17 g Oral Daily Ainsley Spinner, PA-C   17 g at 10/17/20 0840  . QUEtiapine (SEROQUEL) tablet 25 mg  25 mg Oral QHS PRN Charlynne Cousins, MD      . senna-docusate (Senokot-S) tablet 2 tablet  2 tablet Oral BID Ainsley Spinner, PA-C   2 tablet at 10/17/20 2297     Discharge Medications: Please see discharge summary for a list of discharge medications.  Relevant Imaging  Results:  Relevant Lab Results:   Additional Information SSN [place]  Curlene Labrum, [place]

## 2020-10-17 NOTE — Discharge Instructions (Signed)
Orthopaedic Trauma Service Discharge Instructions   General Discharge Instructions  Orthopaedic Injuries:  Left femoral neck fracture treated with percutaneous screws   WEIGHT BEARING STATUS: partial weightbearing left leg (50%)  RANGE OF MOTION/ACTIVITY: unrestricted range of motion left hip and knee   Bone health:  Vitamin d levels look good. Continue with vitamin d supplementation   Wound Care: daily wound care as needed starting 10/18/2020.  See below    Discharge Wound Care Instructions  Do NOT apply any ointments, solutions or lotions to pin sites or surgical wounds.  These prevent needed drainage and even though solutions like hydrogen peroxide kill bacteria, they also damage cells lining the pin sites that help fight infection.  Applying lotions or ointments can keep the wounds moist and can cause them to breakdown and open up as well. This can increase the risk for infection. When in doubt call the office.  Surgical incisions should be dressed daily.  If any drainage is noted, use one layer of adaptic, then gauze and tape   Once the incision is completely dry and without drainage, it may be left open to air out.  Showering may begin 36-48 hours later.  Cleaning gently with soap and water.  Traumatic wounds should be dressed daily as well.    One layer of adaptic, gauze, Kerlix, then ace wrap.  The adaptic can be discontinued once the draining has ceased    If you have a wet to dry dressing: wet the gauze with saline the squeeze as much saline out so the gauze is moist (not soaking wet), place moistened gauze over wound, then place a dry gauze over the moist one, followed by Kerlix wrap, then ace wrap.   Diet: as you were eating previously.  Can use over the counter stool softeners and bowel preparations, such as Miralax, to help with bowel movements.  Narcotics can be constipating.  Be sure to drink plenty of fluids  PAIN MEDICATION USE AND EXPECTATIONS  You have  likely been given narcotic medications to help control your pain.  After a traumatic event that results in an fracture (broken bone) with or without surgery, it is ok to use narcotic pain medications to help control one's pain.  We understand that everyone responds to pain differently and each individual patient will be evaluated on a regular basis for the continued need for narcotic medications. Ideally, narcotic medication use should last no more than 6-8 weeks (coinciding with fracture healing).   As a patient it is your responsibility as well to monitor narcotic medication use and report the amount and frequency you use these medications when you come to your office visit.   We would also advise that if you are using narcotic medications, you should take a dose prior to therapy to maximize you participation.  IF YOU ARE ON NARCOTIC MEDICATIONS IT IS NOT PERMISSIBLE TO OPERATE A MOTOR VEHICLE (MOTORCYCLE/CAR/TRUCK/MOPED) OR HEAVY MACHINERY DO NOT MIX NARCOTICS WITH OTHER CNS (CENTRAL NERVOUS SYSTEM) DEPRESSANTS SUCH AS ALCOHOL   STOP SMOKING OR USING NICOTINE PRODUCTS!!!!  As discussed nicotine severely impairs your body's ability to heal surgical and traumatic wounds but also impairs bone healing.  Wounds and bone heal by forming microscopic blood vessels (angiogenesis) and nicotine is a vasoconstrictor (essentially, shrinks blood vessels).  Therefore, if vasoconstriction occurs to these microscopic blood vessels they essentially disappear and are unable to deliver necessary nutrients to the healing tissue.  This is one modifiable factor that you can do to  dramatically increase your chances of healing your injury.    (This means no smoking, no nicotine gum, patches, etc)  DO NOT USE NONSTEROIDAL ANTI-INFLAMMATORY DRUGS (NSAID'S)  Using products such as Advil (ibuprofen), Aleve (naproxen), Motrin (ibuprofen) for additional pain control during fracture healing can delay and/or prevent the healing  response.  If you would like to take over the counter (OTC) medication, Tylenol (acetaminophen) is ok.  However, some narcotic medications that are given for pain control contain acetaminophen as well. Therefore, you should not exceed more than 4000 mg of tylenol in a day if you do not have liver disease.  Also note that there are may OTC medicines, such as cold medicines and allergy medicines that my contain tylenol as well.  If you have any questions about medications and/or interactions please ask your doctor/PA or your pharmacist.      ICE AND ELEVATE INJURED/OPERATIVE EXTREMITY  Using ice and elevating the injured extremity above your heart can help with swelling and pain control.  Icing in a pulsatile fashion, such as 20 minutes on and 20 minutes off, can be followed.    Do not place ice directly on skin. Make sure there is a barrier between to skin and the ice pack.    Using frozen items such as frozen peas works well as the conform nicely to the are that needs to be iced.  USE AN ACE WRAP OR TED HOSE FOR SWELLING CONTROL  In addition to icing and elevation, Ace wraps or TED hose are used to help limit and resolve swelling.  It is recommended to use Ace wraps or TED hose until you are informed to stop.    When using Ace Wraps start the wrapping distally (farthest away from the body) and wrap proximally (closer to the body)   Example: If you had surgery on your leg or thing and you do not have a splint on, start the ace wrap at the toes and work your way up to the thigh        If you had surgery on your upper extremity and do not have a splint on, start the ace wrap at your fingers and work your way up to the upper arm  IF YOU ARE IN A SPLINT OR CAST DO NOT [place]   If your splint gets wet for any reason please contact the office immediately. You may shower in your splint or cast as long as you keep it dry.  This can be done by wrapping in a cast cover or garbage back (or  similar)  Do Not stick any thing down your splint or cast such as pencils, money, or hangers to try and scratch yourself with.  If you feel itchy take benadryl as prescribed on the bottle for itching  IF YOU ARE IN A CAM BOOT (BLACK BOOT)  You may remove boot periodically. Perform daily dressing changes as noted below.  Wash the liner of the boot regularly and wear a sock when wearing the boot. It is recommended that you sleep in the boot until told otherwise    Call office for the following:  Temperature greater than 101F  Persistent nausea and vomiting  Severe uncontrolled pain  Redness, tenderness, or signs of infection (pain, swelling, redness, odor or green/yellow discharge around the site)  Difficulty breathing, headache or visual disturbances  Hives  Persistent dizziness or light-headedness  Extreme fatigue  Any other questions or concerns you may have after discharge  In an emergency, call 911 or go to an Emergency Department at a nearby hospital  HELPFUL INFORMATION  ? If you had a block, it will wear off between 8-24 hrs postop typically.  This is period when your pain may go from nearly zero to the pain you would have had postop without the block.  This is an abrupt transition but nothing dangerous is happening.  You may take an extra dose of narcotic when this happens.  ? You should wean off your narcotic medicines as soon as you are able.  Most patients will be off or using minimal narcotics before their first postop appointment.   ? We suggest you use the pain medication the first night prior to going to bed, in order to ease any pain when the anesthesia wears off. You should avoid taking pain medications on an empty stomach as it will make you nauseous.  ? Do not drink alcoholic beverages or take illicit drugs when taking pain medications.  ? In most states it is against the law to drive while you are in a splint or sling.  And certainly against the law to drive  while taking narcotics.  ? You may return to work/school in the next couple of days when you feel up to it.   ? Pain medication may make you constipated.  Below are a few solutions to try in this order: - Decrease the amount of pain medication if you aren't having pain. - Drink lots of decaffeinated fluids. - Drink prune juice and/or each dried prunes  o If the first 3 don't work start with additional solutions - Take Colace - an over-the-counter stool softener - Take Senokot - an over-the-counter laxative - Take Miralax - a stronger over-the-counter laxative     CALL THE OFFICE WITH ANY QUESTIONS OR CONCERNS: [phone]   VISIT OUR WEBSITE FOR ADDITIONAL INFORMATION: [email or web address]

## 2020-10-17 NOTE — TOC CAGE-AID Note (Signed)
Transition of Care [facility]) - CAGE-AID Screening   Patient Details  Name: Robin Guzman MRN: [id number] Date of Birth: [birth year]  Transition of Care [facility]) CM/SW Contact:    Emeterio Reeve, [place] Phone Number: 10/17/2020, 11:03 AM   Clinical Narrative:  Pt is unable to participate in assessment due to being disoriented x4.   CAGE-AID Screening: Substance Abuse Screening unable to be completed due to: : Patient unable to participate            [place] Clinical Social Worker [phone]

## 2020-10-17 NOTE — TOC Initial Note (Addendum)
Transition of Care [facility]) - Initial/Assessment Note    Patient Details  Name: Robin Guzman MRN: [id number] Date of Birth: [birth year]  Transition of Care [facility]) CM/SW Contact:    Curlene Labrum, RN Phone Number: 10/17/2020, 4:38 PM  Clinical Narrative:                 Case management noted that patient with dementia and only oriented to self with sitter at bedside.  I called and spoke with the daughter, [name] and offered support and information regarding SNF placement and the patient's daughter agreed.  The patient currently lives at [facility] in [place], [place] and will not be able to return there after her recent fall - WC use - and subsequent Left hip fracture and repair.  The patient's daughter, [name], was given Medicare choice regarding SNF placement - started SNF work up in [place], [place] area and will need to follow up for choice regarding SNF once beds are available in the hub.  The daughter was also given choice regarding outpatient palliative and daughter chose [facility] - I will call the [facility] liaison and let the RNCM be aware to follow at outpatient level at the facility.  Trixie Dredge, RNCM with [facility] was called for referral to follow the patient for outpatient palliative at the facility.  Will continue to follow for SNF placement and offer family choice regarding SNF placement and insurance authorization once chosen.  Expected Discharge Plan: Skilled Nursing Facility Barriers to Discharge: Continued Medical Work up   Patient Goals and CMS Choice Patient states their goals for this hospitalization and ongoing recovery are:: Patient from ALF - family would like SNF placement for rehab after surgery. CMS Medicare.gov Compare Post Acute Care list provided to:: Patient Represenative (must comment) (daughter - Alberteen Sam) Choice offered to / list presented to : Adult Children  Expected Discharge Plan and Services Expected Discharge Plan:  [place] In-house Referral: Hospice / Palliative Care (daughter requesting outpatient palliative through [facility]) Discharge Planning Services: CM Consult Post Acute Care Choice: [place] arrangements for the past 2 months: [place] (from [facility] in [place], [place])                                      Prior Living Arrangements/Services Living arrangements for the past 2 months: [facility] (from [facility] in [place], [place]) Lives with:: Facility Resident Patient language and need for interpreter reviewed:: Yes Do you feel safe going back to the place where you live?: Yes      Need for Family Participation in Patient Care: Yes (Comment) Care giver support system in place?: Yes (comment) Current home services: DME (uses WC at facility) Criminal Activity/Legal Involvement Pertinent to Current Situation/Hospitalization: No - Comment as needed  Activities of Daily Living Home Assistive Devices/Equipment: Wheelchair ADL Screening (condition at time of admission) Patient's cognitive ability adequate to safely complete daily activities?: No Is the patient deaf or have difficulty hearing?: Yes Does the patient have difficulty seeing, even when wearing glasses/contacts?: No Does the patient have difficulty concentrating, remembering, or making decisions?: Yes Patient able to express need for assistance with ADLs?: Yes Does the patient have difficulty dressing or bathing?: No Independently performs ADLs?: No Does the patient have difficulty walking or climbing stairs?: Yes Weakness of Legs: Both Weakness of Arms/Hands: None  Permission Sought/Granted Permission sought to share information with :  Case Manager Permission granted to share information with : Yes, Verbal Permission Granted     Permission granted to share info w AGENCY: SNF facility and [facility]  Permission granted to share info w Relationship:  daughter, [name]     Emotional Assessment Appearance:: Appears stated age Attitude/Demeanor/Rapport: Reactive Affect (typically observed): Other (comment) (patient with dementia - sitter at bedside) Orientation: : Oriented to Self Alcohol / Substance Use: Not Applicable Psych Involvement: No (comment)  Admission diagnosis:  Closed fracture of left hip, initial encounter ([place]) [S72.002A] Fall, initial encounter [W19.XXXA] Closed left hip fracture, initial encounter ([place]) [S72.002A] Dementia with behavioral disturbance, unspecified dementia type ([place]) [F03.91] Patient Active Problem List   Diagnosis Date Noted  . Dementia with behavioral disturbance ([place])   . Surgery, elective   . Palliative care by specialist   . Goals of care, counseling/discussion   . Closed left hip fracture, initial encounter ([place]) 10/13/2020  . Moderate protein malnutrition ([place]) 10/13/2020  . Choledocholithiasis 10/01/2020  . Compression fracture of L1 vertebra (HCC) 06/23/2020  . Leukocytosis 06/23/2020  . Macrocytic anemia 06/23/2020  . Hyperglycemia due to diabetes mellitus ([place]) 06/23/2020  . Thrombocytopenia ([place]) 06/23/2020  . Back pain 06/23/2020  . Fall 06/23/2020  . SVT (supraventricular tachycardia) ([place]) 10/23/2018  . PAT (paroxysmal atrial tachycardia) ([place]) 04/23/2018  . LBBB (left bundle branch block) 04/23/2018  . Dyslipidemia 04/23/2018  . Atherosclerosis of aorta ([place]) 04/23/2018  . Acute systolic heart failure ([place]) 07/28/2017  . Sundowning 07/28/2017  . NSTEMI (non-ST elevated myocardial infarction) ([place]) 07/26/2017  . Closed 3-part fracture of proximal humerus, left, with routine healing, subsequent encounter 12/02/2016  . UTI (urinary tract infection) 12/25/2013  . Dyspnea 12/25/2013  . Near syncope 12/25/2013  . CAD (coronary artery disease) 12/25/2013  . Chronic diastolic heart failure ([place]) 08/07/2013  . Essential hypertension 08/07/2013  . Controlled type 2 diabetes  mellitus without complication, without long-term current use of insulin ([place]) 08/07/2013  . Hypercholesteremia 08/07/2013   PCP:  Celene Squibb, MD Pharmacy:   [facility] - [place], [place] - [address] [place] [address] Phone: [phone] Fax: [phone]     Social Determinants of Health (SDOH) Interventions    Readmission Risk Interventions Readmission Risk Prevention Plan 10/17/2020  Transportation Screening Complete  Medication Review ([place]) Complete  PCP or Specialist appointment within 3-5 days of discharge Complete  HRI or Home Care Consult Complete  SW Recovery Care/Counseling Consult Complete  Palliative Care Screening Complete  Skilled Nursing Facility Complete  Some recent data might be hidden

## 2020-10-18 DIAGNOSIS — S72002A Fracture of unspecified part of neck of left femur, initial encounter for closed fracture: Secondary | ICD-10-CM | POA: Diagnosis not present

## 2020-10-18 DIAGNOSIS — K8050 Calculus of bile duct without cholangitis or cholecystitis without obstruction: Secondary | ICD-10-CM | POA: Diagnosis not present

## 2020-10-18 DIAGNOSIS — I5032 Chronic diastolic (congestive) heart failure: Secondary | ICD-10-CM | POA: Diagnosis not present

## 2020-10-18 DIAGNOSIS — E119 Type 2 diabetes mellitus without complications: Secondary | ICD-10-CM | POA: Diagnosis not present

## 2020-10-18 LAB — CBC
HCT: 25.4 % — ABNORMAL LOW (ref 36.0–46.0)
Hemoglobin: 7.8 g/dL — ABNORMAL LOW (ref 12.0–15.0)
MCH: 31.1 pg (ref 26.0–34.0)
MCHC: 30.7 g/dL (ref 30.0–36.0)
MCV: 101.2 fL — ABNORMAL HIGH (ref 80.0–100.0)
Platelets: UNDETERMINED 10*3/uL (ref 150–400)
RBC: 2.51 MIL/uL — ABNORMAL LOW (ref 3.87–5.11)
RDW: 23.8 % — ABNORMAL HIGH (ref 11.5–15.5)
WBC: 5.6 10*3/uL (ref 4.0–10.5)
nRBC: 0 % (ref 0.0–0.2)

## 2020-10-18 LAB — GLUCOSE, CAPILLARY
Glucose-Capillary: 127 mg/dL — ABNORMAL HIGH (ref 70–99)
Glucose-Capillary: 149 mg/dL — ABNORMAL HIGH (ref 70–99)
Glucose-Capillary: 119 mg/dL — ABNORMAL HIGH (ref 70–99)
Glucose-Capillary: 131 mg/dL — ABNORMAL HIGH (ref 70–99)
Glucose-Capillary: 138 mg/dL — ABNORMAL HIGH (ref 70–99)
Glucose-Capillary: 136 mg/dL — ABNORMAL HIGH (ref 70–99)

## 2020-10-18 NOTE — Progress Notes (Signed)
Hydrologist [facility])  Hospital Liaison: RN note         Notified by [facility] manager of patient/family request for [facility] Palliative services at [facility] after discharge.             [place] Palliative team will follow up with patient after discharge.         Please call with any hospice or palliative related questions.         Thank you for this referral.         Farrel Gordon, RN, CCM  [place] (listed on [place] under Hospice/[facility])    [phone]

## 2020-10-18 NOTE — Progress Notes (Signed)
[facility] PROGRESS NOTE    Progress Note  Robin Guzman  HUT:[id number] DOB: Apr 11, 1928 DOA: 10/13/2020 PCP: Celene Squibb, MD     Brief Narrative:   Robin Guzman is an 86 y.o. female past medical history significant osteoarthritis, CAD with a history of an MI, chronic diastolic heart failure, dementia diabetes mellitus type 2, essential hypertension, with history of choledocholithiasis admitted on November 10 who underwent ERCP with removal of CBD stone coming back from the nursing home due to having a fall. To have a low closed left hip fracture and transferred to [facility] had fixation of the left hip.   She is awaiting skilled nursing facility placement.  Assessment/Plan:   Acute confusional state superimposed on advanced dementia: Patient continues to be calm continue melatonin Haldol IV as needed for agitation. No sitter at bedside.  Closed left hip fracture, initial encounter [facility]) Orthopedic surgery was consulted to perform cannulated fixation of the left femoral neck Physical and occupational therapy were consulted recommended skilled nursing facility [facility] has been consulted he probably needs skilled nursing facility.  Chronic diastolic heart failure ([place]): No signs of decompensation will restart diuretic therapy continue beta-blockers.  Essential hypertension Seems fairly controlled continue current regimen.  Controlled type 2 diabetes mellitus without complication, without long-term current use of insulin ([place]) Continue to hold Metformin, continue CBGs before meals and at bedtime blood glucose fairly controlled.  CAD/dyslipidemia: No longer on Lipitor, continue Coreg.  Paroxysmal atrial tachycardia: Try to keep potassium greater than 4 magnesium greater than 2 ischemic sinus rhythm now.  Microcytic anemia/acute blood loss anemia: Hemoglobin this morning is 7.7 no signs of overt bleeding. Recheck a CBC tomorrow morning she is currently asymptomatic. Check  an anemia panel.  Choledocholithiasis: Noted.  Moderate protein caloric malnutrition: Awaiting nutritionist recommendations.  Mild leukocytosis: Likely stress demargination from surgery leukocytosis is resolved.  Goals of care: Palliative care was consulted the patient continues to be a DNR MOST form was filled by the palliative team and appreciate assistance they she would likely benefit from referral to the outpatient palliative care as an outpatient.   DVT prophylaxis: lovenox Family Communication:Daughter Status is: Inpatient  Remains inpatient appropriate because:Hemodynamically unstable   Dispo: The patient is from: SNF              Anticipated d/c is to: SNF              Anticipated d/c date is: 1 days              Patient currently is medically stable to d/c.  Awaiting skilled nursing facility placement.        Code Status:     Code Status Orders  (From admission, onward)         Start     Ordered   10/13/20 2306  Do not attempt resuscitation (DNR)  Continuous       Question Answer Comment  In the event of cardiac or respiratory ARREST Do not call a "code blue"   In the event of cardiac or respiratory ARREST Do not perform Intubation, CPR, defibrillation or ACLS   In the event of cardiac or respiratory ARREST Use medication by any route, position, wound care, and other measures to relive pain and suffering. May use oxygen, suction and manual treatment of airway obstruction as needed for comfort.      10/13/20 2307        Code Status History    Date Active Date Inactive  Code Status Order ID Comments User Context   10/13/2020 2308 10/13/2020 2308 DNR [id number]  Reubin Milan, MD ED   10/13/2020 1754 10/13/2020 2307 DNR [id number]  Fredia Sorrow, MD ED   10/01/2020 2017 10/06/2020 2127 DNR [id number]  Elgergawy, Silver Huguenin, MD ED   06/23/2020 0011 06/25/2020 1946 Full Code [id number]  Bernadette Hoit, DO ED   07/26/2017 1630 07/29/2017 1751 Full Code  [id number]  Lendon Colonel, NP ED   12/16/2013 1107 12/17/2013 1830 Full Code [id number]  Samuella Cota, MD Inpatient   Advance Care Planning Activity    Advance Directive Documentation     Most Recent Value  Type of Advance Directive Out of facility DNR (pink MOST or yellow form)  Pre-existing out of facility DNR order (yellow form or pink MOST form) --  "MOST" Form in Place? --        IV Access:    Peripheral IV   Procedures and diagnostic studies:   No results found.   Medical Consultants:    None.  Anti-Infectives:   none  Subjective:    Robin Guzman sleepy with no complaints this morning.  Objective:    Vitals:   10/17/20 1500 10/17/20 2040 10/18/20 0458 10/18/20 0808  BP: 113/72 121/71 140/76 140/76  Pulse: 70 75 75 76  Resp: 17 18 18 18   Temp: 97.9 F (36.6 C) 98 F (36.7 C) 98.1 F (36.7 C) 97.6 F (36.4 C)  TempSrc: Oral Oral Oral Oral  SpO2: 97% 96% 100%   Weight:      Height:       SpO2: 100 % O2 Flow Rate (L/min): 5 L/min   Intake/Output Summary (Last 24 hours) at 10/18/2020 0937 Last data filed at 10/17/2020 1716 Gross per 24 hour  Intake 480 ml  Output --  Net 480 ml   Filed Weights   10/13/20 1646  Weight: 66 kg    Exam: General exam: In no acute distress. Respiratory system: Good air movement and clear to auscultation. Cardiovascular system: S1 & S2 heard, RRR. No JVD. Gastrointestinal system: Abdomen is nondistended, soft and nontender.  Extremities: No pedal edema. Skin: No rashes, lesions or ulcers   Data Reviewed:    Labs: Basic Metabolic Panel: Recent Labs  Lab 10/13/20 1744 10/13/20 1744 10/14/20 1732 10/15/20 0153 10/15/20 1623 10/16/20 0110  NA 137  --   --  140  --  141  K 4.0   < >  --  4.0  --  4.4  CL 106  --   --  105  --  106  CO2 23  --   --  26  --  25  GLUCOSE 160*  --   --  104*  --  160*  BUN 13  --   --  8  --  10  CREATININE 0.96  --  0.88 0.85  --  1.01*  CALCIUM 9.0   --   --  8.8*  --  8.8*  MG  --   --   --   --  1.7  --    < > = values in this interval not displayed.   GFR Estimated Creatinine Clearance: 34.6 mL/min (A) (by C-G formula based on SCr of 1.01 mg/dL (H)). Liver Function Tests: Recent Labs  Lab 10/13/20 1744 10/15/20 0153  AST 49* 32  ALT 27 19  ALKPHOS 303* 230*  BILITOT 3.6* 2.6*  PROT 5.9* 4.4*  ALBUMIN 2.9* 2.1*   Recent  Labs  Lab 10/13/20 1744  LIPASE 27   No results for input(s): AMMONIA in the last 168 hours. Coagulation profile No results for input(s): INR, PROTIME in the last 168 hours. COVID-19 Labs  Recent Labs    10/17/20 1016  FERRITIN 463*    Lab Results  Component Value Date   SARSCOV2NAA NEGATIVE 10/13/2020   SARSCOV2NAA NEGATIVE 10/01/2020   SARSCOV2NAA NEGATIVE 06/24/2020   [place] NEGATIVE 06/22/2020    CBC: Recent Labs  Lab 10/14/20 1732 10/15/20 0153 10/16/20 0110 10/17/20 0328 10/18/20 0158  WBC 10.5 12.0* 10.9* 6.2 5.6  HGB 9.2* 8.8* 8.6* 7.7* 7.8*  HCT 29.4* 28.2* 27.4* 23.7* 25.4*  MCV 99.7 99.6 98.9 100.0 101.2*  PLT 143* 131* 150 121* PLATELET CLUMPS NOTED ON SMEAR, UNABLE TO ESTIMATE   Cardiac Enzymes: No results for input(s): CKTOTAL, CKMB, CKMBINDEX, TROPONINI in the last 168 hours. BNP (last 3 results) No results for input(s): PROBNP in the last 8760 hours. CBG: Recent Labs  Lab 10/17/20 1643 10/17/20 2039 10/18/20 0129 10/18/20 0457 10/18/20 0741  GLUCAP 122* 138* 127* 149* 119*   D-Dimer: No results for input(s): DDIMER in the last 72 hours. Hgb A1c: No results for input(s): HGBA1C in the last 72 hours. Lipid Profile: No results for input(s): CHOL, HDL, LDLCALC, TRIG, CHOLHDL, LDLDIRECT in the last 72 hours. Thyroid function studies: No results for input(s): TSH, T4TOTAL, T3FREE, THYROIDAB in the last 72 hours.  Invalid input(s): FREET3 Anemia work up: Recent Labs    10/17/20 1016  VITAMINB12 353  FOLATE 30.8  FERRITIN 463*  TIBC 134*  IRON  42  RETICCTPCT 2.6   Sepsis Labs: Recent Labs  Lab 10/15/20 0153 10/16/20 0110 10/17/20 0328 10/18/20 0158  WBC 12.0* 10.9* 6.2 5.6   Microbiology Recent Results (from the past 240 hour(s))  Resp Panel by RT-PCR (Flu A&B, Covid) Nasopharyngeal Swab     Status: None   Collection Time: 10/13/20 10:31 PM   Specimen: Nasopharyngeal Swab; Nasopharyngeal(NP) swabs in vial transport medium  Result Value Ref Range Status   SARS Coronavirus 2 by RT PCR NEGATIVE NEGATIVE Final    Comment: (NOTE) SARS-CoV-2 target nucleic acids are NOT DETECTED.  The SARS-CoV-2 RNA is generally detectable in upper respiratory specimens during the acute phase of infection. The lowest concentration of SARS-CoV-2 viral copies this assay can detect is 138 copies/mL. A negative result does not preclude SARS-Cov-2 infection and should not be used as the sole basis for treatment or other patient management decisions. A negative result may occur with  improper specimen collection/handling, submission of specimen other than nasopharyngeal swab, presence of viral mutation(s) within the areas targeted by this assay, and inadequate number of viral copies(<138 copies/mL). A negative result must be combined with clinical observations, patient history, and epidemiological information. The expected result is Negative.  Fact Sheet for Patients:  EntrepreneurPulse.com.au  Fact Sheet for Healthcare Providers:  IncredibleEmployment.be  This test is no t yet approved or cleared by the [place] FDA and  has been authorized for detection and/or diagnosis of SARS-CoV-2 by FDA under an Emergency Use Authorization (EUA). This EUA will remain  in effect (meaning this test can be used) for the duration of the COVID-19 declaration under Section 564(b)(1) of the Act, 21 U.S.C.section 360bbb-3(b)(1), unless the authorization is terminated  or revoked sooner.       Influenza A by PCR  NEGATIVE NEGATIVE Final   Influenza B by PCR NEGATIVE NEGATIVE Final    Comment: (NOTE) The Xpert  Xpress SARS-CoV-2/FLU/RSV plus assay is intended as an aid in the diagnosis of influenza from Nasopharyngeal swab specimens and should not be used as a sole basis for treatment. Nasal washings and aspirates are unacceptable for Xpert Xpress SARS-CoV-2/FLU/RSV testing.  Fact Sheet for Patients: EntrepreneurPulse.com.au  Fact Sheet for Healthcare Providers: IncredibleEmployment.be  This test is not yet approved or cleared by the [place] FDA and has been authorized for detection and/or diagnosis of SARS-CoV-2 by FDA under an Emergency Use Authorization (EUA). This EUA will remain in effect (meaning this test can be used) for the duration of the COVID-19 declaration under Section 564(b)(1) of the Act, 21 U.S.C. section 360bbb-3(b)(1), unless the authorization is terminated or revoked.  Performed at [facility], [address], [place], [place] [address]      Medications:   . vitamin C  500 mg Oral Daily  . carvedilol  25 mg Oral BID WC  . cholecalciferol  2,000 Units Oral BID  . docusate sodium  100 mg Oral BID  . enoxaparin (LOVENOX) injection  40 mg Subcutaneous Q24H  . famotidine  20 mg Oral BID  . feeding supplement  237 mL Oral BID BM  . insulin aspart  0-9 Units Subcutaneous Q4H  . magnesium oxide  200 mg Oral BID  . melatonin  3 mg Oral QHS  . multivitamin with minerals  1 tablet Oral Daily  . pantoprazole  40 mg Oral BID  . polyethylene glycol  17 g Oral Daily  . senna-docusate  2 tablet Oral BID   Continuous Infusions:     LOS: 5 days   Charlynne Cousins  [facility]  10/18/2020, 9:37 AM

## 2020-10-19 DIAGNOSIS — K8050 Calculus of bile duct without cholangitis or cholecystitis without obstruction: Secondary | ICD-10-CM | POA: Diagnosis not present

## 2020-10-19 DIAGNOSIS — E119 Type 2 diabetes mellitus without complications: Secondary | ICD-10-CM | POA: Diagnosis not present

## 2020-10-19 DIAGNOSIS — S72002A Fracture of unspecified part of neck of left femur, initial encounter for closed fracture: Secondary | ICD-10-CM | POA: Diagnosis not present

## 2020-10-19 DIAGNOSIS — I5032 Chronic diastolic (congestive) heart failure: Secondary | ICD-10-CM | POA: Diagnosis not present

## 2020-10-19 LAB — GLUCOSE, CAPILLARY
Glucose-Capillary: 111 mg/dL — ABNORMAL HIGH (ref 70–99)
Glucose-Capillary: 111 mg/dL — ABNORMAL HIGH (ref 70–99)
Glucose-Capillary: 116 mg/dL — ABNORMAL HIGH (ref 70–99)
Glucose-Capillary: 122 mg/dL — ABNORMAL HIGH (ref 70–99)
Glucose-Capillary: 124 mg/dL — ABNORMAL HIGH (ref 70–99)
Glucose-Capillary: 126 mg/dL — ABNORMAL HIGH (ref 70–99)
Glucose-Capillary: 137 mg/dL — ABNORMAL HIGH (ref 70–99)

## 2020-10-19 NOTE — Plan of Care (Signed)
Patient is alert, confused, a high fall risk. Needs anticipated and addressed. Bed alarm on for safety. VSS stable. In no acute distress. Problem: Education: Goal: Knowledge of General Education information will improve Description: Including pain rating scale, medication(s)/side effects and non-pharmacologic comfort measures Outcome: Progressing   Problem: Health Behavior/Discharge Planning: Goal: Ability to manage health-related needs will improve Outcome: Progressing   Problem: Clinical Measurements: Goal: Ability to maintain clinical measurements within normal limits will improve Outcome: Progressing Goal: Will remain free from infection Outcome: Progressing Goal: Diagnostic test results will improve Outcome: Progressing Goal: Respiratory complications will improve Outcome: Progressing Goal: Cardiovascular complication will be avoided Outcome: Progressing   Problem: Activity: Goal: Risk for activity intolerance will decrease Outcome: Progressing   Problem: Nutrition: Goal: Adequate nutrition will be maintained Outcome: Progressing   Problem: Coping: Goal: Level of anxiety will decrease Outcome: Progressing   Problem: Elimination: Goal: Will not experience complications related to bowel motility Outcome: Progressing Goal: Will not experience complications related to urinary retention Outcome: Progressing   Problem: Pain Managment: Goal: General experience of comfort will improve Outcome: Progressing   Problem: Safety: Goal: Ability to remain free from injury will improve Outcome: Progressing   Problem: Skin Integrity: Goal: Risk for impaired skin integrity will decrease Outcome: Progressing

## 2020-10-19 NOTE — Plan of Care (Addendum)
Patient is s/p cannulated fixation of the left hip on 11/23. No s/sx of distress or pain at this time. Plan for discharge is SNF placement - pending. Will continue to monitor and continue current POC.

## 2020-10-19 NOTE — Progress Notes (Signed)
[facility] PROGRESS NOTE    Progress Note  Robin Guzman  NGE:[id number] DOB: August 22, 1928 DOA: 10/13/2020 PCP: Celene Squibb, MD     Brief Narrative:   Robin Guzman is an 86 y.o. female past medical history significant osteoarthritis, CAD with a history of an MI, chronic diastolic heart failure, dementia diabetes mellitus type 2, essential hypertension, with history of choledocholithiasis admitted on November 10 who underwent ERCP with removal of CBD stone coming back from the nursing home due to having a fall. To have a low closed left hip fracture and transferred to [facility] had fixation of the left hip.   She is awaiting skilled nursing facility placement.  Assessment/Plan:   Acute confusional state superimposed on advanced dementia: Patient continues to be calm continue melatonin Haldol IV as needed for agitation. No sitter at bedside. TOC has been consulted and awaiting skilled nursing facility placement.  Closed left hip fracture, initial encounter [facility]) Orthopedic surgery was consulted to perform cannulated fixation of the left femoral neck Physical and occupational therapy were consulted recommended skilled nursing facility [facility] has been consulted he probably needs skilled nursing facility.  Chronic diastolic heart failure ([place]): No signs of decompensation will restart diuretic therapy continue beta-blockers.  Essential hypertension Seems fairly controlled continue current regimen.  Controlled type 2 diabetes mellitus without complication, without long-term current use of insulin (Norco) Continue to hold Metformin, continue CBGs before meals and at bedtime blood glucose fairly controlled.  CAD/dyslipidemia: No longer on Lipitor, continue Coreg.  Paroxysmal atrial tachycardia: Try to keep potassium greater than 4 magnesium greater than 2 ischemic sinus rhythm now.  Microcytic anemia/acute blood loss anemia: Hemoglobin this morning is 7.7 no signs of overt  bleeding. Recheck a CBC tomorrow morning she is currently asymptomatic. Check an anemia panel.  Choledocholithiasis: Noted.  Moderate protein caloric malnutrition: Awaiting nutritionist recommendations.  Mild leukocytosis: Likely stress demargination from surgery leukocytosis is resolved.  Goals of care: Palliative care was consulted the patient continues to be a DNR MOST form was filled by the palliative team and appreciate assistance they she would likely benefit from referral to the outpatient palliative care as an outpatient.   DVT prophylaxis: lovenox Family Communication:Daughter Status is: Inpatient  Remains inpatient appropriate because:Hemodynamically unstable   Dispo: The patient is from: SNF              Anticipated d/c is to: SNF              Anticipated d/c date is: 1 days              Patient currently is medically stable to d/c.  Awaiting skilled nursing facility placement.        Code Status:     Code Status Orders  (From admission, onward)         Start     Ordered   10/13/20 2306  Do not attempt resuscitation (DNR)  Continuous       Question Answer Comment  In the event of cardiac or respiratory ARREST Do not call a "code blue"   In the event of cardiac or respiratory ARREST Do not perform Intubation, CPR, defibrillation or ACLS   In the event of cardiac or respiratory ARREST Use medication by any route, position, wound care, and other measures to relive pain and suffering. May use oxygen, suction and manual treatment of airway obstruction as needed for comfort.      10/13/20 2307  Code Status History    Date Active Date Inactive Code Status Order ID Comments User Context   10/13/2020 2308 10/13/2020 2308 DNR [id number]  Reubin Milan, MD ED   10/13/2020 1754 10/13/2020 2307 DNR [id number]  Fredia Sorrow, MD ED   10/01/2020 2017 10/06/2020 2127 DNR [id number]  Elgergawy, Silver Huguenin, MD ED   06/23/2020 0011 06/25/2020 1946 Full Code  [id number]  Bernadette Hoit, DO ED   07/26/2017 1630 07/29/2017 1751 Full Code [id number]  Lendon Colonel, NP ED   12/16/2013 1107 12/17/2013 1830 Full Code [id number]  Samuella Cota, MD Inpatient   Advance Care Planning Activity    Advance Directive Documentation     Most Recent Value  Type of Advance Directive Out of facility DNR (pink MOST or yellow form)  Pre-existing out of facility DNR order (yellow form or pink MOST form) --  "MOST" Form in Place? --        IV Access:    Peripheral IV   Procedures and diagnostic studies:   No results found.   Medical Consultants:    None.  Anti-Infectives:   none  Subjective:    Robin Guzman is awake this morning no complaints.  Objective:    Vitals:   10/18/20 0808 10/18/20 1542 10/18/20 2012 10/19/20 0405  BP: 140/76 (!) 165/73 (!) 149/69 (!) 177/71  Pulse: 76 74 72 69  Resp: 18 17 16 16   Temp: 97.6 F (36.4 C) 98.9 F (37.2 C) 97.8 F (36.6 C) 97.9 F (36.6 C)  TempSrc: Oral Oral    SpO2:  96% 100% 100%  Weight:      Height:       SpO2: 100 % O2 Flow Rate (L/min): 5 L/min   Intake/Output Summary (Last 24 hours) at 10/19/2020 0905 Last data filed at 10/18/2020 1300 Gross per 24 hour  Intake 60 ml  Output --  Net 60 ml   Filed Weights   10/13/20 1646  Weight: 66 kg    Exam: General exam: In no acute distress. Respiratory system: Good air movement and clear to auscultation. Cardiovascular system: S1 & S2 heard, RRR. No JVD. Gastrointestinal system: Abdomen is nondistended, soft and nontender.  Extremities: No pedal edema. Skin: No rashes, lesions or ulcers   Data Reviewed:    Labs: Basic Metabolic Panel: Recent Labs  Lab 10/13/20 1744 10/13/20 1744 10/14/20 1732 10/15/20 0153 10/15/20 1623 10/16/20 0110  NA 137  --   --  140  --  141  K 4.0   < >  --  4.0  --  4.4  CL 106  --   --  105  --  106  CO2 23  --   --  26  --  25  GLUCOSE 160*  --   --  104*  --  160*  BUN 13   --   --  8  --  10  CREATININE 0.96  --  0.88 0.85  --  1.01*  CALCIUM 9.0  --   --  8.8*  --  8.8*  MG  --   --   --   --  1.7  --    < > = values in this interval not displayed.   GFR Estimated Creatinine Clearance: 34.6 mL/min (A) (by C-G formula based on SCr of 1.01 mg/dL (H)). Liver Function Tests: Recent Labs  Lab 10/13/20 1744 10/15/20 0153  AST 49* 32  ALT 27 19  ALKPHOS 303* 230*  BILITOT  3.6* 2.6*  PROT 5.9* 4.4*  ALBUMIN 2.9* 2.1*   Recent Labs  Lab 10/13/20 1744  LIPASE 27   No results for input(s): AMMONIA in the last 168 hours. Coagulation profile No results for input(s): INR, PROTIME in the last 168 hours. COVID-19 Labs  Recent Labs    10/17/20 1016  FERRITIN 463*    Lab Results  Component Value Date   SARSCOV2NAA NEGATIVE 10/13/2020   SARSCOV2NAA NEGATIVE 10/01/2020   SARSCOV2NAA NEGATIVE 06/24/2020   [place] NEGATIVE 06/22/2020    CBC: Recent Labs  Lab 10/14/20 1732 10/15/20 0153 10/16/20 0110 10/17/20 0328 10/18/20 0158  WBC 10.5 12.0* 10.9* 6.2 5.6  HGB 9.2* 8.8* 8.6* 7.7* 7.8*  HCT 29.4* 28.2* 27.4* 23.7* 25.4*  MCV 99.7 99.6 98.9 100.0 101.2*  PLT 143* 131* 150 121* PLATELET CLUMPS NOTED ON SMEAR, UNABLE TO ESTIMATE   Cardiac Enzymes: No results for input(s): CKTOTAL, CKMB, CKMBINDEX, TROPONINI in the last 168 hours. BNP (last 3 results) No results for input(s): PROBNP in the last 8760 hours. CBG: Recent Labs  Lab 10/18/20 1145 10/18/20 1631 10/18/20 2012 10/19/20 0007 10/19/20 0404  GLUCAP 131* 138* 136* 111* 137*   D-Dimer: No results for input(s): DDIMER in the last 72 hours. Hgb A1c: No results for input(s): HGBA1C in the last 72 hours. Lipid Profile: No results for input(s): CHOL, HDL, LDLCALC, TRIG, CHOLHDL, LDLDIRECT in the last 72 hours. Thyroid function studies: No results for input(s): TSH, T4TOTAL, T3FREE, THYROIDAB in the last 72 hours.  Invalid input(s): FREET3 Anemia work up: Recent Labs     10/17/20 1016  VITAMINB12 353  FOLATE 30.8  FERRITIN 463*  TIBC 134*  IRON 42  RETICCTPCT 2.6   Sepsis Labs: Recent Labs  Lab 10/15/20 0153 10/16/20 0110 10/17/20 0328 10/18/20 0158  WBC 12.0* 10.9* 6.2 5.6   Microbiology Recent Results (from the past 240 hour(s))  Resp Panel by RT-PCR (Flu A&B, Covid) Nasopharyngeal Swab     Status: None   Collection Time: 10/13/20 10:31 PM   Specimen: Nasopharyngeal Swab; Nasopharyngeal(NP) swabs in vial transport medium  Result Value Ref Range Status   SARS Coronavirus 2 by RT PCR NEGATIVE NEGATIVE Final    Comment: (NOTE) SARS-CoV-2 target nucleic acids are NOT DETECTED.  The SARS-CoV-2 RNA is generally detectable in upper respiratory specimens during the acute phase of infection. The lowest concentration of SARS-CoV-2 viral copies this assay can detect is 138 copies/mL. A negative result does not preclude SARS-Cov-2 infection and should not be used as the sole basis for treatment or other patient management decisions. A negative result may occur with  improper specimen collection/handling, submission of specimen other than nasopharyngeal swab, presence of viral mutation(s) within the areas targeted by this assay, and inadequate number of viral copies(<138 copies/mL). A negative result must be combined with clinical observations, patient history, and epidemiological information. The expected result is Negative.  Fact Sheet for Patients:  EntrepreneurPulse.com.au  Fact Sheet for Healthcare Providers:  IncredibleEmployment.be  This test is no t yet approved or cleared by the [place] FDA and  has been authorized for detection and/or diagnosis of SARS-CoV-2 by FDA under an Emergency Use Authorization (EUA). This EUA will remain  in effect (meaning this test can be used) for the duration of the COVID-19 declaration under Section 564(b)(1) of the Act, 21 U.S.C.section 360bbb-3(b)(1), unless  the authorization is terminated  or revoked sooner.       Influenza A by PCR NEGATIVE NEGATIVE Final   Influenza  B by PCR NEGATIVE NEGATIVE Final    Comment: (NOTE) The Xpert Xpress SARS-CoV-2/FLU/RSV plus assay is intended as an aid in the diagnosis of influenza from Nasopharyngeal swab specimens and should not be used as a sole basis for treatment. Nasal washings and aspirates are unacceptable for Xpert Xpress SARS-CoV-2/FLU/RSV testing.  Fact Sheet for Patients: EntrepreneurPulse.com.au  Fact Sheet for Healthcare Providers: IncredibleEmployment.be  This test is not yet approved or cleared by the [place] FDA and has been authorized for detection and/or diagnosis of SARS-CoV-2 by FDA under an Emergency Use Authorization (EUA). This EUA will remain in effect (meaning this test can be used) for the duration of the COVID-19 declaration under Section 564(b)(1) of the Act, 21 U.S.C. section 360bbb-3(b)(1), unless the authorization is terminated or revoked.  Performed at [facility], [address], [place], [place] [address]      Medications:   . vitamin C  500 mg Oral Daily  . carvedilol  25 mg Oral BID WC  . cholecalciferol  2,000 Units Oral BID  . docusate sodium  100 mg Oral BID  . enoxaparin (LOVENOX) injection  40 mg Subcutaneous Q24H  . famotidine  20 mg Oral BID  . feeding supplement  237 mL Oral BID BM  . insulin aspart  0-9 Units Subcutaneous Q4H  . magnesium oxide  200 mg Oral BID  . melatonin  3 mg Oral QHS  . multivitamin with minerals  1 tablet Oral Daily  . pantoprazole  40 mg Oral BID  . polyethylene glycol  17 g Oral Daily  . senna-docusate  2 tablet Oral BID   Continuous Infusions:     LOS: 6 days   Charlynne Cousins  [facility]  10/19/2020, 9:05 AM

## 2020-10-19 NOTE — Plan of Care (Signed)

## 2020-10-19 NOTE — TOC Progression Note (Signed)
Transition of Care [facility]) - Progression Note    Patient Details  Name: Robin Guzman MRN: [id number] Date of Birth: 26-Mar-1928  Transition of Care [facility]) CM/SW [place], [place] Phone Number: 10/19/2020, 10:18 AM  Clinical Narrative:     CSW spoke to pts daughter Robin Guzman by phone. CSW explained that [facility] is the only facilitiy that has made an offer. Robin Guzman stated she would like other options and requested csw to fax to facilities in [place] as well.   TOC will follow.   Expected Discharge Plan: [place] Barriers to Discharge: Continued Medical Work up  Expected Discharge Plan and Services Expected Discharge Plan: [place] In-house Referral: Hospice / [place] (daughter requesting outpatient palliative through [facility]) Discharge Planning Services: CM Consult Post Acute Care Choice: [place] arrangements for the past 2 months: [place] (from [facility] in [place], [place])                                       Social Determinants of Health ([place]) Interventions    Readmission Risk Interventions Readmission Risk Prevention Plan 10/17/2020  Transportation Screening Complete  Medication Review Press photographer) Complete  PCP or Specialist appointment within 3-5 days of discharge Complete  HRI or [place] Complete  SW Recovery Care/Counseling Consult Complete  Palliative Care Screening Complete  [place] Complete  Some recent data might be hidden   Emeterio Reeve, [place], [place] Social Worker [phone]

## 2020-10-20 DIAGNOSIS — E119 Type 2 diabetes mellitus without complications: Secondary | ICD-10-CM | POA: Diagnosis not present

## 2020-10-20 DIAGNOSIS — I5032 Chronic diastolic (congestive) heart failure: Secondary | ICD-10-CM | POA: Diagnosis not present

## 2020-10-20 DIAGNOSIS — S72002A Fracture of unspecified part of neck of left femur, initial encounter for closed fracture: Secondary | ICD-10-CM | POA: Diagnosis not present

## 2020-10-20 DIAGNOSIS — K8050 Calculus of bile duct without cholangitis or cholecystitis without obstruction: Secondary | ICD-10-CM | POA: Diagnosis not present

## 2020-10-20 LAB — GLUCOSE, CAPILLARY
Glucose-Capillary: 116 mg/dL — ABNORMAL HIGH (ref 70–99)
Glucose-Capillary: 116 mg/dL — ABNORMAL HIGH (ref 70–99)
Glucose-Capillary: 123 mg/dL — ABNORMAL HIGH (ref 70–99)
Glucose-Capillary: 133 mg/dL — ABNORMAL HIGH (ref 70–99)
Glucose-Capillary: 140 mg/dL — ABNORMAL HIGH (ref 70–99)
Glucose-Capillary: 142 mg/dL — ABNORMAL HIGH (ref 70–99)

## 2020-10-20 MED ORDER — HYDROCODONE-ACETAMINOPHEN 5-325 MG PO TABS: 0.5000 | ORAL_TABLET | ORAL | 0 refills | Status: AC | PRN

## 2020-10-20 NOTE — Discharge Summary (Addendum)
Physician Discharge Summary  Robin Guzman:[id number] DOB: 08-Nov-1928 DOA: 10/13/2020  PCP: Celene Squibb, MD  Admit date: 10/13/2020 Discharge date: 10/22/2020  10/22/20: Addendum: patient received one unit PRBC before discharge for a Hgb of 7,8 with good toleration.  Discharge summary per Dr. Aileen Fass:   Admitted From: home Disposition:  SNF  Recommendations for Outpatient Follow-up:  1. Follow up with PCP in 1-2 weeks 2. Please obtain BMP/CBC in one week 3. Palliative care to follow-up at facility.   Home Health:No Equipment/Devices:None  Discharge Condition:Stable CODE STATUS:DNR Diet recommendation: Heart Healthy  Brief/Interim Summary: 86 y.o. female past medical history significant osteoarthritis, CAD with a history of an MI, chronic diastolic heart failure, dementia diabetes mellitus type 2, essential hypertension, with history of choledocholithiasis admitted on November 10 who underwent ERCP with removal of CBD stone coming back from the nursing home due to having a fall. To have a low closed left hip fracture and transferred to [facility] had fixation of the left hip.    Discharge Diagnoses:  Principal Problem:   Closed left hip fracture, initial encounter [facility]) Active Problems:   Chronic diastolic heart failure (HCC)   Essential hypertension   Controlled type 2 diabetes mellitus without complication, without long-term current use of insulin (HCC)   Hypercholesteremia   CAD (coronary artery disease)   PAT (paroxysmal atrial tachycardia) (HCC)   Macrocytic anemia   Fall   Choledocholithiasis   Moderate protein malnutrition (HCC)   Dementia with behavioral disturbance [facility])   Surgery, elective   Palliative care by specialist   Goals of care, counseling/discussion  Acute confusional state superimposed on advanced dementia: There is postsurgical she had to be placed on Seroquel and melatonin at bedtime with sitter at bedtime there is resolved now she is more  calm able to have a simple conversation and tolerating her diet.  Closed left hip fracture: Orthopedic surgery was consulted who performed fixation and cannulation of the left femoral neck. Physical therapy evaluated the patient recommended skilled nursing facility.  Chronic diastolic heart failure: Appears to be compensated no changes made to her medication she will continue beta-blockers and diuretics.  Essential hypertension: Blood pressure well controlled no changes made.  Diabetes mellitus type 2: No change made to her medication.  CAD/hyperlipidemia: Continue Coreg no longer on Lipitor.  Paroxysmal atrial tachycardia: This resolved, we try to keep her potassium greater than 4 magnesium greater than 2 she is now in sinus rhythm.  Microcytic anemia/acute blood loss anemia: No signs of overt bleeding her hemoglobin remained stable at around 7.7.  Choledocholithiasis: Noted.  Moderate protein caloric malnutrition: Continue Ensure 3 times daily.  Goals of care: Palliative Care met with family MOST form was filled out.  Discharge Instructions  Discharge Instructions    Diet - low sodium heart healthy   Complete by: As directed    Increase activity slowly   Complete by: As directed    No wound care   Complete by: As directed      Allergies as of 10/21/2020      Reactions   Daypro [oxaprozin] Shortness Of Breath, Other (See Comments)   weakness   Advil [ibuprofen] Other (See Comments)   weakness      Medication List    TAKE these medications   acetaminophen 325 MG tablet Commonly known as: TYLENOL Take 1-2 tablets (325-650 mg total) by mouth every 6 (six) hours as needed for mild pain or moderate pain (or Fever >/= 101). What changed:  medication strength  how much to take  when to take this  reasons to take this   ascorbic acid 500 MG tablet Commonly known as: VITAMIN C Take 1 tablet (500 mg total) by mouth daily.   aspirin EC 81 MG tablet Take  1 tablet (81 mg total) by mouth daily with breakfast.   atorvastatin 10 MG tablet Commonly known as: LIPITOR Take 10 mg by mouth daily.   carvedilol 25 MG tablet Commonly known as: COREG Take 1 tablet (25 mg total) by mouth 2 (two) times daily with a meal.   Centrum Adults Tabs Take 1 tablet by mouth daily.   famotidine 20 MG tablet Commonly known as: PEPCID Take 20 mg by mouth 2 (two) times daily.   ferrous sulfate 325 (65 FE) MG tablet Take 325 mg by mouth daily.   HYDROcodone-acetaminophen 5-325 MG tablet Commonly known as: NORCO/VICODIN Take 0.5 tablets by mouth every 4 (four) hours as needed for moderate pain or severe pain.   insulin aspart 100 UNIT/ML FlexPen Commonly known as: NOVOLOG Inject 0-6 Units into the skin 3 (three) times daily with meals. insulin aspart (novoLOG) injection 0-6 Units 0-6 Units Subcutaneous, 3 times daily with meals CBG < 70: Implement Hypoglycemia Standing Orders and refer to Hypoglycemia Standing Orders sidebar report  CBG 70 - 120: 0 unit CBG 121 - 150: 0 unit  CBG 151 - 200: 0 unit CBG 201 - 250: 1 units CBG 251 - 300:  2units CBG 301 - 350:  3 units  CBG 351 - 400:  5 units  CBG > 400: 6 units   LORazepam 0.5 MG tablet Commonly known as: Ativan Take 1 tablet (0.5 mg total) by mouth every 12 (twelve) hours as needed for anxiety or sleep.   metFORMIN 500 MG tablet Commonly known as: GLUCOPHAGE Take 500 mg by mouth 2 (two) times daily with a meal.   pantoprazole 40 MG tablet Commonly known as: PROTONIX Take 1 tablet (40 mg total) by mouth 2 (two) times daily.   polyethylene glycol 17 g packet Commonly known as: MiraLax Take 17 g by mouth daily.   senna-docusate 8.6-50 MG tablet Commonly known as: Senokot-S Take 2 tablets by mouth 2 (two) times daily.   Vitamin D3 25 MCG tablet Commonly known as: Vitamin D Take 2 tablets (2,000 Units total) by mouth daily.       Follow-up Information    Altamese Reedley, MD. Schedule an  appointment as soon as possible for a visit in 2 week(s).   Specialty: Orthopedic Surgery Contact information: [address] [place] [address] [phone]              Allergies  Allergen Reactions  . Daypro [Oxaprozin] Shortness Of Breath and Other (See Comments)    weakness  . Advil [Ibuprofen] Other (See Comments)    weakness    Consultations:  Orthopedics   Procedures/Studies: CT ABDOMEN PELVIS WO CONTRAST  Result Date: 10/13/2020 CLINICAL DATA:  Abdominal trauma, jaundice. EXAM: CT ABDOMEN AND PELVIS WITHOUT CONTRAST TECHNIQUE: Multidetector CT imaging of the abdomen and pelvis was performed following the standard protocol without IV contrast. COMPARISON:  10/01/2020 FINDINGS: Lower chest: Coronary and aortic atherosclerosis. Mild cardiomegaly. Hepatobiliary: Gallstones in the gallbladder. No well-defined or definite residual biliary dilatation. Pancreas: Unremarkable Spleen: Unremarkable Adrenals/Urinary Tract: Bilateral renal cysts of varying complexity are unchanged. 1.4 by 1.4 cm myelolipoma of the lateral limb of the right adrenal gland. Stomach/Bowel: Scattered colonic diverticula. There a few diverticula of  the terminal ileum is well. Sigmoid colon diverticulosis without active diverticulitis. Vascular/Lymphatic: Aortoiliac atherosclerotic vascular disease. No pathologic adenopathy is identified. Reproductive: Uterus absent. Stable 9.9 by 6.7 cm fluid density structure along the vaginal cuff/left adnexa. Other: Stable trace perihepatic ascites. Increased flank edema bilaterally and increased edema lateral to the hips in the subcutaneous tissues, and tracking into the right upper thigh. Musculoskeletal: Healing fractures of the left lower fifth and sixth ribs. Subacute subcapital fracture of the left femoral neck, without current volume loss or avascular necrosis of the femoral head. 60% compression fracture at L1 fracture plane along the inferior endplate and about  4 mm of posterior bony retropulsion, not appreciably changed from 10/01/2020. Lower thoracic spondylosis with bridging fusion. Grade 1 degenerative anterolisthesis at L4-5. IMPRESSION: 1. Subacute subcapital fracture of the left femoral neck, without current volume loss or avascular necrosis of the femoral head. 2. Healing fractures of the left lower fifth and sixth ribs. 3. 60% compression fracture at L1, not appreciably changed from 10/01/2020. 4. Other imaging findings of potential clinical significance: Coronary atherosclerosis. Mild cardiomegaly. Bilateral renal cysts of varying complexity are unchanged. Sigmoid colon diverticulosis. Small myelolipoma of the right adrenal gland. Increased edema lateral to the hips in the subcutaneous tissues, and tracking into the right upper thigh. Stable trace perihepatic ascites. Cholelithiasis. Stable simple appearing large fluid collection along the vaginal cuff/left adnexa. 5. Aortic atherosclerosis. Aortic Atherosclerosis (ICD10-I70.0). Electronically Signed   By: Van Clines M.D.   On: 10/13/2020 20:30   CT Head Wo Contrast  Result Date: 10/13/2020 CLINICAL DATA:  Confusion, jaundice, unexplained skin tear on the left upper arm EXAM: CT HEAD WITHOUT CONTRAST TECHNIQUE: Contiguous axial images were obtained from the base of the skull through the vertex without intravenous contrast. COMPARISON:  CT head 10/13/2020 FINDINGS: Brain: The brainstem, cerebellum, cerebral peduncles, thalami, basal ganglia, basilar cisterns, and ventricular system appear within normal limits. Periventricular white matter and corona radiata hypodensities favor chronic ischemic microvascular white matter disease. No intracranial hemorrhage, mass lesion, or acute CVA. Vascular: There is atherosclerotic calcification of the cavernous carotid arteries bilaterally. Skull: Unremarkable Sinuses/Orbits: Mucous retention cyst in the right maxillary sinus. Chronic right sphenoid sinusitis.  Other: No supplemental non-categorized findings. IMPRESSION: 1. No acute intracranial findings. 2. Periventricular white matter and corona radiata hypodensities favor chronic ischemic microvascular white matter disease. 3. Mucous retention cyst in the right maxillary sinus. Chronic right sphenoid sinusitis. Electronically Signed   By: Van Clines M.D.   On: 10/13/2020 20:13   CT Head Wo Contrast  Result Date: 10/10/2020 CLINICAL DATA:  Fall, head injury, EXAM: CT HEAD WITHOUT CONTRAST TECHNIQUE: Contiguous axial images were obtained from the base of the skull through the vertex without intravenous contrast. COMPARISON:  None. FINDINGS: Brain: Normal anatomic configuration. Parenchymal volume loss is commensurate with the patient's age. Mild periventricular white matter changes are present likely reflecting the sequela of small vessel ischemia. No abnormal intra or extra-axial mass lesion or fluid collection. No abnormal mass effect or midline shift. No evidence of acute intracranial hemorrhage or infarct. Ventricular size is normal. Cerebellum unremarkable. Vascular: No asymmetric hyperdense vasculature at the skull base. Skull: Intact Sinuses/Orbits: Large mucous retention cyst noted within the right maxillary sinus. Remaining visualized paranasal sinuses are clear. Orbits are unremarkable. Other: Mastoid air cells and middle ear cavities are clear. IMPRESSION: No acute intracranial injury.  No calvarial fracture. Right maxillary sinus disease. Electronically Signed   By: Fidela Salisbury MD   On: 10/10/2020 06:35  CT ABDOMEN PELVIS W CONTRAST  Result Date: 10/01/2020 CLINICAL DATA:  Mid abdominal pain, dementia, jaundice EXAM: CT ABDOMEN AND PELVIS WITH CONTRAST TECHNIQUE: Multidetector CT imaging of the abdomen and pelvis was performed using the standard protocol following bolus administration of intravenous contrast. CONTRAST:  37m OMNIPAQUE IOHEXOL 300 MG/ML  SOLN COMPARISON:  06/22/2020  FINDINGS: Lower chest: No acute pleural or parenchymal lung disease. Hepatobiliary: Multiple calcified gallstones are identified. No evidence of gallbladder wall thickening or acute cholecystitis. There is mild intrahepatic biliary duct dilation. No focal liver abnormalities. Common bile duct measures up to 9 mm in diameter, which is grossly normal for age. There is a 9 mm calculus within the downstream common bile duct best seen on image 24 series 3. Pancreas: Unremarkable. No pancreatic ductal dilatation or surrounding inflammatory changes. Spleen: Spleen is enlarged measuring 15.1 cm in craniocaudal length. No focal splenic abnormalities. Adrenals/Urinary Tract: There are bilateral renal cortical cysts, largest off the lower pole left kidney measuring up to 6.8 cm. Bilateral renal cortical thinning. No nephrolithiasis or obstructive uropathy. The bladder is unremarkable. The adrenals are normal. Stomach/Bowel: No bowel obstruction or ileus. Distal colonic diverticulosis without diverticulitis. No bowel wall thickening or inflammatory change. Vascular/Lymphatic: Aortic atherosclerosis. No enlarged abdominal or pelvic lymph nodes. Reproductive: Uterus is surgically absent. There is a large cyst within the left adnexa measuring 9.8 x 6.6 x 6.2 cm. No mural nodularity or internal septation. No right adnexal mass. Other: No free fluid or free gas.  No abdominal wall hernia. Musculoskeletal: There is a subacute left anterolateral sixth rib fracture. Chronic L1 compression fracture is again noted. Reconstructed images demonstrate no additional findings. IMPRESSION: 1. Mild intrahepatic biliary duct dilation, with evidence of a 9 mm calculus in the downstream common bile duct. ERCP may be useful. 2. Cholelithiasis without cholecystitis. 3. 9.8 cm left adnexal cyst. Recommend follow-up [facility] 6-12 months. Note: This recommendation does not apply to premenarchal patients and to those with increased risk (genetic, family  history, elevated tumor markers or other high-risk factors) of ovarian cancer. Reference: JACR 2020 Feb; 17(2):248-254 4. Splenomegaly. Electronically Signed   By: MRanda NgoM.D.   On: 10/01/2020 17:14   Chest Portable 1 View  Result Date: 10/13/2020 CLINICAL DATA:  Preop left hip fracture EXAM: PORTABLE CHEST 1 VIEW COMPARISON:  06/22/2020 FINDINGS: The heart size and mediastinal contours are within normal limits. Both lungs are clear. The visualized skeletal structures are unremarkable. IMPRESSION: No active disease. Electronically Signed   By: KUlyses JarredM.D.   On: 10/13/2020 23:37   DG ERCP BILIARY & PANCREATIC DUCTS  Result Date: 10/03/2020 CLINICAL DATA:  86year old female with jaundice EXAM: ERCP TECHNIQUE: Multiple spot images obtained with the fluoroscopic device and submitted for interpretation post-procedure. FLUOROSCOPY TIME:  Fluoroscopy Time:  2 minutes 38 seconds COMPARISON:  MR 10/02/2020 FINDINGS: Limited intraoperative fluoroscopic spot images during ERCP. Initial image demonstrates the endoscope projecting over the upper abdomen. Cannulation of the ampulla with safety wire in place and then retrograde infusion of contrast. Deployment of a retrieval balloon. IMPRESSION: Limited images during ERCP demonstrates partial opacification of the extrahepatic biliary ducts with deployment of a balloon retrieval catheter. Please refer to the dictated operative report for full details of intraoperative findings and procedure. Electronically Signed   By: JCorrie MckusickD.O.   On: 10/03/2020 14:08   DG Humerus Left  Result Date: 10/13/2020 CLINICAL DATA:  Bruising and pain along the left arm EXAM: LEFT HUMERUS - 2+ VIEW COMPARISON:  Radiographs from 10/10/2020 FINDINGS: Deformity of the proximal humeral shaft related to a remote prior surgical neck fracture (originally shown on 11/18/2016). No new fracture is identified. No acute bony findings or abnormal gas tracking in the soft tissues.  The focal soft tissue swelling overlying the mid humerus laterally is reduced compared to the prior exam. IMPRESSION: 1. Soft tissue swelling overlying the mid humerus laterally is reduced compared to the prior exam. No acute bony findings. 2. Deformity from old healed proximal humeral fracture. Electronically Signed   By: Van Clines M.D.   On: 10/13/2020 20:15   DG Humerus Left  Result Date: 10/10/2020 CLINICAL DATA:  Fall, left arm injury EXAM: LEFT HUMERUS - 2+ VIEW COMPARISON:  None. FINDINGS: Two view radiograph left humerus demonstrates a remote, healed fracture of the left humeral head with minimal residual deformity. No acute fracture or dislocation. There is soft tissue swelling involving the lateral aspect of the mid left upper extremity at the level of the mid diaphysis of the humerus. IMPRESSION: Soft tissue swelling.  No fracture or dislocation. Electronically Signed   By: Fidela Salisbury MD   On: 10/10/2020 06:38   DG C-Arm 1-60 Min  Result Date: 10/14/2020 CLINICAL DATA:  Cannulated screws. EXAM: OPERATIVE left HIP (WITH PELVIS IF PERFORMED) 2 VIEWS TECHNIQUE: Fluoroscopic spot image(s) were submitted for interpretation post-operatively. COMPARISON:  CT October 13, 2020. FINDINGS: Fluoro time: 1 minutes 5 seconds. Two C-arm fluoroscopic images were obtained intraoperatively and submitted for post operative interpretation. These images demonstrate fixation of a left subtrochanteric fracture with cannulated screws. Please see the performing provider's procedural report for further detail. IMPRESSION: Intraoperative fluoroscopic images, as detailed above. Electronically Signed   By: Margaretha Sheffield MD   On: 10/14/2020 16:04   MR ABDOMEN MRCP W WO CONTAST  Result Date: 10/02/2020 CLINICAL DATA:  Jaundice.  Biliary ductal dilatation on recent CT. EXAM: MRI ABDOMEN WITHOUT AND WITH CONTRAST (INCLUDING MRCP) TECHNIQUE: Multiplanar multisequence MR imaging of the abdomen was performed  both before and after the administration of intravenous contrast. Heavily T2-weighted images of the biliary and pancreatic ducts were obtained, and three-dimensional MRCP images were rendered by post processing. CONTRAST:  18m GADAVIST GADOBUTROL 1 MMOL/ML IV SOLN COMPARISON:  CT on 10/01/2020 FINDINGS: Lower chest: No acute findings. Hepatobiliary: No hepatic masses identified. Multiple gallstones are seen measuring up to 1 cm, and layering gallbladder sludge is also noted. No evidence of acute cholecystitis. Diffuse biliary ductal dilatation is seen, with common bile duct measuring 17 mm in diameter. An 11 mm calculus is seen in the distal common bile duct adjacent to the ampulla on image 19/3. Pancreas:  No mass or inflammatory changes. Spleen:  Within normal limits in size and appearance. Adrenals/Urinary Tract: Multiple small bilateral renal cortical and sinus cysts are seen. A 7 cm subcapsular cyst is seen projecting from the lower pole of the left kidney. No renal masses identified. No evidence of hydronephrosis. Stomach/Bowel: A few tiny duodenal diverticula are seen at the junction of the 2nd and 3rd portions. Otherwise unremarkable. Vascular/Lymphatic: No pathologically enlarged lymph nodes identified. No abdominal aortic aneurysm. Other:  None. Musculoskeletal:  No suspicious bone lesions identified. IMPRESSION: Cholelithiasis and gallbladder sludge. No evidence of acute cholecystitis. Diffuse biliary ductal dilatation, due to 11 mm calculus in the distal common bile duct adjacent to the ampulla. Electronically Signed   By: JMarlaine HindM.D.   On: 10/02/2020 16:10   ECHOCARDIOGRAM COMPLETE  Result Date: 10/03/2020    ECHOCARDIOGRAM  REPORT   Patient Name:   Robin Guzman Date of Exam: 10/03/2020 Medical Rec #:  [id number]        Height:       64.0 in Accession #:    [id number]       Weight:       147.0 lb Date of Birth:  Jun 15, 1928        BSA:          1.716 m Patient Age:    80 years         BP:            136/59 mmHg Patient Gender: F                HR:           65 bpm. Exam Location:  Inpatient Procedure: 2D Echo Indications:    cardiomyopathy 425.9  History:        Patient has prior history of Echocardiogram examinations, most                 recent 02/11/2016. CAD, Abnormal ECG; Risk Factors:Diabetes,                 Dyslipidemia and Hypertension.  Sonographer:    Johny Chess Referring Phys: Buford Dresser  Sonographer Comments: Image acquisition challenging due to patient behavioral factors. and Image acquisition challenging due to uncooperative patient. IMPRESSIONS  1. Left ventricular ejection fraction, by estimation, is 55 to 60%. The left ventricle has normal function. The left ventricle has no regional wall motion abnormalities. There is mild left ventricular hypertrophy. Left ventricular diastolic parameters are consistent with Grade I diastolic dysfunction (impaired relaxation).  2. Right ventricular systolic function is normal. The right ventricular size is normal. There is normal pulmonary artery systolic pressure. The estimated right ventricular systolic pressure is 97.6 mmHg.  3. Left atrial size was mildly dilated.  4. The mitral valve is normal in structure. Trivial mitral valve regurgitation. No evidence of mitral stenosis.  5. The aortic valve is tricuspid. Aortic valve regurgitation is mild.  6. The inferior vena cava is normal in size with greater than 50% respiratory variability, suggesting right atrial pressure of 3 mmHg. FINDINGS  Left Ventricle: Left ventricular ejection fraction, by estimation, is 55 to 60%. The left ventricle has normal function. The left ventricle has no regional wall motion abnormalities. The left ventricular internal cavity size was normal in size. There is  mild left ventricular hypertrophy. Left ventricular diastolic parameters are consistent with Grade I diastolic dysfunction (impaired relaxation). Right Ventricle: The right ventricular size is  normal. No increase in right ventricular wall thickness. Right ventricular systolic function is normal. There is normal pulmonary artery systolic pressure. The tricuspid regurgitant velocity is 2.82 m/s, and  with an assumed right atrial pressure of 3 mmHg, the estimated right ventricular systolic pressure is 73.4 mmHg. Left Atrium: Left atrial size was mildly dilated. Right Atrium: Right atrial size was normal in size. Pericardium: Trivial pericardial effusion is present. Mitral Valve: The mitral valve is normal in structure. Mild mitral annular calcification. Trivial mitral valve regurgitation. No evidence of mitral valve stenosis. Tricuspid Valve: The tricuspid valve is normal in structure. Tricuspid valve regurgitation is trivial. Aortic Valve: The aortic valve is tricuspid. Aortic valve regurgitation is mild. Aortic regurgitation PHT measures 433 msec. Pulmonic Valve: The pulmonic valve was normal in structure. Pulmonic valve regurgitation is not visualized. Aorta: The aortic root is normal in size and structure. Venous: The  inferior vena cava is normal in size with greater than 50% respiratory variability, suggesting right atrial pressure of 3 mmHg. IAS/Shunts: No atrial level shunt detected by color flow Doppler.  LEFT VENTRICLE PLAX 2D LVIDd:         4.30 cm Diastology LVIDs:         2.70 cm LV e' medial:    4.68 cm/s LV PW:         1.00 cm LV E/e' medial:  18.5 LV IVS:        1.10 cm LV e' lateral:   6.09 cm/s                        LV E/e' lateral: 14.2  RIGHT VENTRICLE             IVC RV S prime:     13.10 cm/s  IVC diam: 1.40 cm LEFT ATRIUM             Index       RIGHT ATRIUM           Index LA diam:        3.00 cm 1.75 cm/m  RA Area:     11.00 cm LA Vol (A2C):   54.6 ml 31.81 ml/m RA Volume:   23.00 ml  13.40 ml/m LA Vol (A4C):   46.1 ml 26.86 ml/m LA Biplane Vol: 52.3 ml 30.47 ml/m  AORTIC VALVE LVOT Vmax:   124.00 cm/s LVOT Vmean:  83.300 cm/s LVOT VTI:    0.319 m AI PHT:      433 msec  AORTA  Ao Root diam: 3.40 cm Ao Asc diam:  3.50 cm MITRAL VALVE                TRICUSPID VALVE MV Area (PHT): 2.24 cm     TR Peak grad:   31.8 mmHg MV Decel Time: 338 msec     TR Vmax:        282.00 cm/s MV E velocity: 86.60 cm/s MV A velocity: 128.00 cm/s  SHUNTS MV E/A ratio:  0.68         Systemic VTI: 0.32 m Loralie Champagne MD Electronically signed by Loralie Champagne MD Signature Date/Time: 10/03/2020/12:26:17 PM    Final    DG Hip Port Unilat With Pelvis 1V Left  Result Date: 10/14/2020 CLINICAL DATA:  Postop EXAM: DG HIP (WITH OR WITHOUT PELVIS) 1V PORT LEFT COMPARISON:  10/13/2020 FINDINGS: Interval 3 screw fixation of proximal left femur for femoral neck fracture. Intact hardware. Near anatomic alignment. IMPRESSION: Interval screw fixation of proximal left femur for femoral neck fracture. Electronically Signed   By: Donavan Foil M.D.   On: 10/14/2020 17:16   DG HIP OPERATIVE UNILAT W OR W/O PELVIS LEFT  Result Date: 10/14/2020 CLINICAL DATA:  Cannulated screws. EXAM: OPERATIVE left HIP (WITH PELVIS IF PERFORMED) 2 VIEWS TECHNIQUE: Fluoroscopic spot image(s) were submitted for interpretation post-operatively. COMPARISON:  CT October 13, 2020. FINDINGS: Fluoro time: 1 minutes 5 seconds. Two C-arm fluoroscopic images were obtained intraoperatively and submitted for post operative interpretation. These images demonstrate fixation of a left subtrochanteric fracture with cannulated screws. Please see the performing provider's procedural report for further detail. IMPRESSION: Intraoperative fluoroscopic images, as detailed above. Electronically Signed   By: Margaretha Sheffield MD   On: 10/14/2020 16:04   (Echo, Carotid, EGD, Colonoscopy, ERCP)    Subjective:   Discharge Exam: Vitals:   10/21/20 0300 10/21/20 8295  BP: (!) 109/53 110/62  Pulse: 63 65  Resp: 16 16  Temp: 97.9 F (36.6 C) 98.2 F (36.8 C)  SpO2: 97% 98%   Vitals:   10/20/20 0726 10/20/20 1910 10/21/20 0300 10/21/20 0832  BP:  121/60 114/61 (!) 109/53 110/62  Pulse: 65 61 63 65  Resp: '18 16 16 16  ' Temp: 98 F (36.7 C) 98 F (36.7 C) 97.9 F (36.6 C) 98.2 F (36.8 C)  TempSrc:  Axillary Axillary Axillary  SpO2: 97% 96% 97% 98%  Weight:      Height:        General: Pt is alert, awake, not in acute distress Cardiovascular: RRR, S1/S2 +, no rubs, no gallops Respiratory: CTA bilaterally, no wheezing, no rhonchi Abdominal: Soft, NT, ND, bowel sounds + Extremities: no edema, no cyanosis    The results of significant diagnostics from this hospitalization (including imaging, microbiology, ancillary and laboratory) are listed below for reference.     Microbiology: Recent Results (from the past 240 hour(s))  Resp Panel by RT-PCR (Flu A&B, Covid) Nasopharyngeal Swab     Status: None   Collection Time: 10/13/20 10:31 PM   Specimen: Nasopharyngeal Swab; Nasopharyngeal(NP) swabs in vial transport medium  Result Value Ref Range Status   SARS Coronavirus 2 by RT PCR NEGATIVE NEGATIVE Final    Comment: (NOTE) SARS-CoV-2 target nucleic acids are NOT DETECTED.  The SARS-CoV-2 RNA is generally detectable in upper respiratory specimens during the acute phase of infection. The lowest concentration of SARS-CoV-2 viral copies this assay can detect is 138 copies/mL. A negative result does not preclude SARS-Cov-2 infection and should not be used as the sole basis for treatment or other patient management decisions. A negative result may occur with  improper specimen collection/handling, submission of specimen other than nasopharyngeal swab, presence of viral mutation(s) within the areas targeted by this assay, and inadequate number of viral copies(<138 copies/mL). A negative result must be combined with clinical observations, patient history, and epidemiological information. The expected result is Negative.  Fact Sheet for Patients:  EntrepreneurPulse.com.au  Fact Sheet for Healthcare Providers:   IncredibleEmployment.be  This test is no t yet approved or cleared by the [place] FDA and  has been authorized for detection and/or diagnosis of SARS-CoV-2 by FDA under an Emergency Use Authorization (EUA). This EUA will remain  in effect (meaning this test can be used) for the duration of the COVID-19 declaration under Section 564(b)(1) of the Act, 21 U.S.C.section 360bbb-3(b)(1), unless the authorization is terminated  or revoked sooner.       Influenza A by PCR NEGATIVE NEGATIVE Final   Influenza B by PCR NEGATIVE NEGATIVE Final    Comment: (NOTE) The Xpert Xpress SARS-CoV-2/FLU/RSV plus assay is intended as an aid in the diagnosis of influenza from Nasopharyngeal swab specimens and should not be used as a sole basis for treatment. Nasal washings and aspirates are unacceptable for Xpert Xpress SARS-CoV-2/FLU/RSV testing.  Fact Sheet for Patients: EntrepreneurPulse.com.au  Fact Sheet for Healthcare Providers: IncredibleEmployment.be  This test is not yet approved or cleared by the [place] FDA and has been authorized for detection and/or diagnosis of SARS-CoV-2 by FDA under an Emergency Use Authorization (EUA). This EUA will remain in effect (meaning this test can be used) for the duration of the COVID-19 declaration under Section 564(b)(1) of the Act, 21 U.S.C. section 360bbb-3(b)(1), unless the authorization is terminated or revoked.  Performed at [facility], [address], [place], [place] [address]  Labs: BNP (last 3 results) No results for input(s): BNP in the last 8760 hours. Basic Metabolic Panel: Recent Labs  Lab 10/14/20 1732 10/15/20 0153 10/15/20 1623 10/16/20 0110  NA  --  140  --  141  K  --  4.0  --  4.4  CL  --  105  --  106  CO2  --  26  --  25  GLUCOSE  --  104*  --  160*  BUN  --  8  --  10  CREATININE 0.88 0.85  --  1.01*  CALCIUM  --  8.8*  --  8.8*  MG  --   --  1.7   --    Liver Function Tests: Recent Labs  Lab 10/15/20 0153  AST 32  ALT 19  ALKPHOS 230*  BILITOT 2.6*  PROT 4.4*  ALBUMIN 2.1*   No results for input(s): LIPASE, AMYLASE in the last 168 hours. No results for input(s): AMMONIA in the last 168 hours. CBC: Recent Labs  Lab 10/14/20 1732 10/15/20 0153 10/16/20 0110 10/17/20 0328 10/18/20 0158  WBC 10.5 12.0* 10.9* 6.2 5.6  HGB 9.2* 8.8* 8.6* 7.7* 7.8*  HCT 29.4* 28.2* 27.4* 23.7* 25.4*  MCV 99.7 99.6 98.9 100.0 101.2*  PLT 143* 131* 150 121* PLATELET CLUMPS NOTED ON SMEAR, UNABLE TO ESTIMATE   Cardiac Enzymes: No results for input(s): CKTOTAL, CKMB, CKMBINDEX, TROPONINI in the last 168 hours. BNP: Invalid input(s): POCBNP CBG: Recent Labs  Lab 10/20/20 1717 10/20/20 1934 10/20/20 2341 10/21/20 0352 10/21/20 0836  GLUCAP 116* 142* 123* 70 146*   D-Dimer No results for input(s): DDIMER in the last 72 hours. Hgb A1c No results for input(s): HGBA1C in the last 72 hours. Lipid Profile No results for input(s): CHOL, HDL, LDLCALC, TRIG, CHOLHDL, LDLDIRECT in the last 72 hours. Thyroid function studies No results for input(s): TSH, T4TOTAL, T3FREE, THYROIDAB in the last 72 hours.  Invalid input(s): FREET3 Anemia work up No results for input(s): VITAMINB12, FOLATE, FERRITIN, TIBC, IRON, RETICCTPCT in the last 72 hours. Urinalysis    Component Value Date/Time   COLORURINE AMBER (A) 10/01/2020 1757   APPEARANCEUR CLEAR 10/01/2020 1757   LABSPEC >1.046 (H) 10/01/2020 1757   PHURINE 5.0 10/01/2020 1757   GLUCOSEU NEGATIVE 10/01/2020 1757   HGBUR NEGATIVE 10/01/2020 1757   BILIRUBINUR SMALL (A) 10/01/2020 1757   KETONESUR NEGATIVE 10/01/2020 1757   PROTEINUR NEGATIVE 10/01/2020 1757   UROBILINOGEN 1.0 12/16/2013 1415   NITRITE NEGATIVE 10/01/2020 1757   LEUKOCYTESUR NEGATIVE 10/01/2020 1757   Sepsis Labs Invalid input(s): PROCALCITONIN,  WBC,  LACTICIDVEN Microbiology Recent Results (from the past 240 hour(s))   Resp Panel by RT-PCR (Flu A&B, Covid) Nasopharyngeal Swab     Status: None   Collection Time: 10/13/20 10:31 PM   Specimen: Nasopharyngeal Swab; Nasopharyngeal(NP) swabs in vial transport medium  Result Value Ref Range Status   SARS Coronavirus 2 by RT PCR NEGATIVE NEGATIVE Final    Comment: (NOTE) SARS-CoV-2 target nucleic acids are NOT DETECTED.  The SARS-CoV-2 RNA is generally detectable in upper respiratory specimens during the acute phase of infection. The lowest concentration of SARS-CoV-2 viral copies this assay can detect is 138 copies/mL. A negative result does not preclude SARS-Cov-2 infection and should not be used as the sole basis for treatment or other patient management decisions. A negative result may occur with  improper specimen collection/handling, submission of specimen other than nasopharyngeal swab, presence of viral mutation(s) within the areas targeted by this  assay, and inadequate number of viral copies(<138 copies/mL). A negative result must be combined with clinical observations, patient history, and epidemiological information. The expected result is Negative.  Fact Sheet for Patients:  EntrepreneurPulse.com.au  Fact Sheet for Healthcare Providers:  IncredibleEmployment.be  This test is no t yet approved or cleared by the [place] FDA and  has been authorized for detection and/or diagnosis of SARS-CoV-2 by FDA under an Emergency Use Authorization (EUA). This EUA will remain  in effect (meaning this test can be used) for the duration of the COVID-19 declaration under Section 564(b)(1) of the Act, 21 U.S.C.section 360bbb-3(b)(1), unless the authorization is terminated  or revoked sooner.       Influenza A by PCR NEGATIVE NEGATIVE Final   Influenza B by PCR NEGATIVE NEGATIVE Final    Comment: (NOTE) The Xpert Xpress SARS-CoV-2/FLU/RSV plus assay is intended as an aid in the diagnosis of influenza from  Nasopharyngeal swab specimens and should not be used as a sole basis for treatment. Nasal washings and aspirates are unacceptable for Xpert Xpress SARS-CoV-2/FLU/RSV testing.  Fact Sheet for Patients: EntrepreneurPulse.com.au  Fact Sheet for Healthcare Providers: IncredibleEmployment.be  This test is not yet approved or cleared by the [place] FDA and has been authorized for detection and/or diagnosis of SARS-CoV-2 by FDA under an Emergency Use Authorization (EUA). This EUA will remain in effect (meaning this test can be used) for the duration of the COVID-19 declaration under Section 564(b)(1) of the Act, 21 U.S.C. section 360bbb-3(b)(1), unless the authorization is terminated or revoked.  Performed at [facility], [address] [place], [place] [address]      Time coordinating discharge: Over 40 minutes  SIGNED:   Charlynne Cousins, MD  [facility] 10/21/2020, 8:45 AM Pager   If 7PM-7AM, please contact night-coverage [email or web address] Password [email or web address]

## 2020-10-20 NOTE — Plan of Care (Signed)
  Problem: Education: Goal: Knowledge of General Education information will improve Description: Including pain rating scale, medication(s)/side effects and non-pharmacologic comfort measures Outcome: Progressing   Problem: Pain Managment: Goal: General experience of comfort will improve Outcome: Progressing   Problem: Activity: Goal: Risk for activity intolerance will decrease Outcome: Not Progressing   Problem: Nutrition: Goal: Adequate nutrition will be maintained Outcome: Not Progressing   Problem: Coping: Goal: Level of anxiety will decrease Outcome: Not Progressing   Problem: Safety: Goal: Ability to remain free from injury will improve Outcome: Not Progressing

## 2020-10-20 NOTE — Care Management Important Message (Signed)
Important Message  Patient Details  Name: Robin Guzman MRN: [id number] Date of Birth: [birth year]   Medicare Important Message Given:  Yes     Hien Perreira P Lutricia Widjaja 10/20/2020, 3:32 PM

## 2020-10-21 DIAGNOSIS — I5032 Chronic diastolic (congestive) heart failure: Secondary | ICD-10-CM | POA: Diagnosis not present

## 2020-10-21 DIAGNOSIS — I10 Essential (primary) hypertension: Secondary | ICD-10-CM | POA: Diagnosis not present

## 2020-10-21 DIAGNOSIS — K8050 Calculus of bile duct without cholangitis or cholecystitis without obstruction: Secondary | ICD-10-CM | POA: Diagnosis not present

## 2020-10-21 DIAGNOSIS — S72002A Fracture of unspecified part of neck of left femur, initial encounter for closed fracture: Secondary | ICD-10-CM | POA: Diagnosis not present

## 2020-10-21 LAB — GLUCOSE, CAPILLARY
Glucose-Capillary: 135 mg/dL — ABNORMAL HIGH (ref 70–99)
Glucose-Capillary: 144 mg/dL — ABNORMAL HIGH (ref 70–99)
Glucose-Capillary: 146 mg/dL — ABNORMAL HIGH (ref 70–99)
Glucose-Capillary: 146 mg/dL — ABNORMAL HIGH (ref 70–99)
Glucose-Capillary: 154 mg/dL — ABNORMAL HIGH (ref 70–99)
Glucose-Capillary: 70 mg/dL (ref 70–99)

## 2020-10-21 LAB — RESP PANEL BY RT-PCR (FLU A&B, COVID) ARPGX2
Influenza A by PCR: NEGATIVE
Influenza B by PCR: NEGATIVE
SARS Coronavirus 2 by RT PCR: NEGATIVE

## 2020-10-21 LAB — CREATININE, SERUM
Creatinine, Ser: 1.09 mg/dL — ABNORMAL HIGH (ref 0.44–1.00)
GFR, Estimated: 48 mL/min — ABNORMAL LOW (ref >60)

## 2020-10-21 MED ORDER — PROSOURCE PLUS PO LIQD
30.0000 mL | Freq: Three times a day (TID) | ORAL | Status: DC
  Administered 2020-10-21 – 2020-10-22 (×2): 30 mL via ORAL
  Filled 2020-10-21 (×3): qty 30

## 2020-10-21 NOTE — Progress Notes (Signed)
Occupational Therapy Treatment Patient Details Name: Robin Guzman MRN: [id number] DOB: Apr 05, 1928 Today's Date: 10/21/2020    History of present illness Robin Guzman is a 86 y.o. female with medical history significant of osteoarthritis, CAD, history of MI, chronic diastolic heart failure, dementia, type 2 diabetes, hyperlipidemia, dyspnea on exertion, hypertension, history of skin cancer, history of cholelithiasis with acute cholestatic hepatitis admitted from November 10 November 15 and underwent ERCP and removal of her CBD stone who is coming from the nursing home due to having a fall at the facility, sustaining a L hip fx and skin tear; s/p surgery for hip fixation and lac repair; TDWB LLE; Noting restlessness is complicating recovery   OT comments  Pt progressing towards OT goals gradually. Pt able to demonstrate short distance mobility using RW at min guard. Pt noted to be pushing through R UE more to assist in offloading L LE though continues with difficulty in following PWB precautions due to cognition/HOH. Pt able to demo grooming tasks standing at sink with Supervision for static standing balance. Pt perseverating on desire for this therapist to take her home from hospital and difficult to redirect at times. Plan to further assess balance and ADLs during next session.    Follow Up Recommendations  SNF;Supervision/Assistance - 24 hour    Equipment Recommendations  None recommended by OT    Recommendations for Other Services      Precautions / Restrictions Precautions Precautions: Fall;Other (comment) Precaution Comments: very HOH Restrictions Weight Bearing Restrictions: Yes LLE Weight Bearing: Partial weight bearing LLE Partial Weight Bearing Percentage or Pounds: 50%       Mobility Bed Mobility Overal bed mobility: Needs Assistance Bed Mobility: Supine to Sit     Supine to sit: Modified independent (Device/Increase time);HOB elevated         Transfers Overall transfer level: Needs assistance Equipment used: Rolling walker (2 wheeled);None Transfers: Sit to/from Stand Sit to Stand: Min guard         General transfer comment: Min guard for safety in standing with RW    Balance Overall balance assessment: Needs assistance;History of Falls Sitting-balance support: Feet supported;No upper extremity supported Sitting balance-Leahy Scale: Fair     Standing balance support: Single extremity supported;During functional activity Standing balance-Leahy Scale: Fair Standing balance comment: fair static standing with one UE or no UE support,                            ADL either performed or assessed with clinical judgement   ADL Overall ADL's : Needs assistance/impaired     Grooming: Supervision/safety;Standing;Brushing hair Grooming Details (indicate cue type and reason): Supervision for brushing hair standing at sink, no LOB with static standing                              Functional mobility during ADLs: Min guard;Rolling walker;Cueing for safety General ADL Comments: Pt with deficits in cognition/hearing, impacting safety during ADLs/mobility. Also with difficulties in adhering to WB precautions due to this     Vision   Vision Assessment?: No apparent visual deficits   Perception     Praxis      Cognition Arousal/Alertness: Awake/alert Behavior During Therapy: Restless;Impulsive Overall Cognitive Status: History of cognitive impairments - at baseline Area of Impairment: Attention;Memory;Following commands;Safety/judgement;Awareness;Problem solving  Current Attention Level: Sustained Memory: Decreased recall of precautions;Decreased short-term memory Following Commands: Follows one step commands with increased time Safety/Judgement: Decreased awareness of safety;Decreased awareness of deficits Awareness: Emergent Problem Solving: Difficulty  sequencing;Requires verbal cues;Requires tactile cues General Comments: ognition vs very HOH when it comes to difficulty following commands/answering questions. Pt with impaired safety, impulsive and benefits from safety cues        Exercises     Shoulder Instructions       General Comments Pt perseverating on desire for therapist to take her home with them. A lot of cues needed to redirect    Pertinent Vitals/ Pain       Pain Assessment: No/denies pain  Home Living                                          Prior Functioning/Environment              Frequency  Min 2X/week        Progress Toward Goals  OT Goals(current goals can now be found in the care plan section)  Progress towards OT goals: Progressing toward goals  Acute Rehab OT Goals Patient Stated Goal: go home today OT Goal Formulation: With patient Time For Goal Achievement: 10/29/20 Potential to Achieve Goals: Good ADL Goals Pt Will Perform Grooming: with supervision;standing Pt Will Perform Lower Body Dressing: with supervision;sit to/from stand;sitting/lateral leans Pt Will Transfer to Toilet: with supervision;stand pivot transfer;bedside commode Pt Will Perform Toileting - Clothing Manipulation and hygiene: with supervision;sitting/lateral leans;sit to/from stand Additional ADL Goal #1: Pt to be able to recall WB precautions to maximize safety and healing  Plan Discharge plan remains appropriate    Co-evaluation                 AM-PAC OT "6 Clicks" Daily Activity     Outcome Measure   Help from another person eating meals?: A Little Help from another person taking care of personal grooming?: A Little Help from another person toileting, which includes using toliet, bedpan, or urinal?: A Little Help from another person bathing (including washing, rinsing, drying)?: A Lot Help from another person to put on and taking off regular upper body clothing?: A Little Help from  another person to put on and taking off regular lower body clothing?: A Lot 6 Click Score: 16    End of Session Equipment Utilized During Treatment: Gait belt;Rolling walker  OT Visit Diagnosis: Other abnormalities of gait and mobility (R26.89);Muscle weakness (generalized) (M62.81);Unsteadiness on feet (R26.81);Repeated falls (R29.6);History of falling (Z91.81);Other symptoms and signs involving cognitive function   Activity Tolerance Patient tolerated treatment well   Patient Left in chair;with call bell/phone within reach;with chair alarm set   Nurse Communication Mobility status        Time: 8527-7824 OT Time Calculation (min): 25 min  Charges: OT General Charges $OT Visit: 1 Visit OT Treatments $Self Care/Home Management : 8-22 mins $Therapeutic Activity: 8-22 mins  Layla Maw, OTR/L   Layla Maw 10/21/2020, 2:39 PM

## 2020-10-21 NOTE — Progress Notes (Signed)
  Speech Language Pathology Treatment: Dysphagia  Patient Details Name: Robin Guzman MRN: [id number] DOB: 1928-01-09 Today's Date: 10/21/2020 Time: 3532-9924 SLP Time Calculation (min) (ACUTE ONLY): 12 min  Assessment / Plan / Recommendation Clinical Impression  Pt for likely D/C today to SNF.  She is much more alert and communicative and swallowing has improved dramatically since her initial assessment. Today she is sitting in recliner, able to feed herself appropriately, demonstrates slow but functional mastication of trials of regular solids.  Consumption of thin liquids reveals good airway protection. Pt able to mix liquid/solid consistencies with no concerns for aspiration. Recommend advancing diet to regular solids, thin liquids.  Swallow function is back to baseline. Acute SLP service to sign off.  D/W RN>   HPI HPI: Pt is a 86 y.o. female with medical history significant of osteoarthritis, CAD, history of MI, chronic diastolic heart failure, dementia, type 2 diabetes, hyperlipidemia, dyspnea on exertion, hypertension, skin cancer, cholelithiasis with acute cholestatic hepatitis. She was admitted 11/10-11/15 and underwent ERCP and removal of her CBD stone and is now admitted from the SNF due to fall. CXR and CT head were negative for acute changes. Pt found to have left femoral neck fracture and is s/p   cannulated screw fixation of left femoral fracture.       SLP Plan  All goals met       Recommendations  Diet recommendations: Regular;Thin liquid Liquids provided via: Cup;Straw Medication Administration: Whole meds with puree Supervision: Patient able to self feed Compensations: Minimize environmental distractions                Oral Care Recommendations: Oral care BID Follow up Recommendations: None SLP Visit Diagnosis: Dysphagia, unspecified (R13.10) Plan: All goals met       GO                Juan Quam Laurice 10/21/2020, 12:03 PM  Lowell Mcgurk L.  Tivis Ringer, [place] Office number [phone] Pager [phone]

## 2020-10-21 NOTE — Plan of Care (Signed)
No acute changes since the previous night that I took care of her. Pending SNF placement. Will continue to monitor and continue current POC.

## 2020-10-21 NOTE — Plan of Care (Signed)

## 2020-10-21 NOTE — Progress Notes (Addendum)
Auth approved. 11/30 -- 12/2. Auth ID [id number]   Pt needs covid test prior to d/c so will d/c tomorrow due to being too late in the day to admit after waiting for a covid test.   CSW is also informed that [facility] has had 2 staff members test positive; possibly effecting visitation and admissions.

## 2020-10-21 NOTE — Progress Notes (Signed)
[facility] PROGRESS NOTE    Progress Note  Robin Guzman  SKA:[id number] DOB: August 31, 1928 DOA: 10/13/2020 PCP: Celene Squibb, MD     Brief Narrative:   Robin Guzman is an 86 y.o. female past medical history significant osteoarthritis, CAD with a history of an MI, chronic diastolic heart failure, dementia diabetes mellitus type 2, essential hypertension, with history of choledocholithiasis admitted on November 10 who underwent ERCP with removal of CBD stone coming back from the nursing home due to having a fall. To have a low closed left hip fracture and transferred to [facility] had fixation of the left hip.   She is awaiting skilled nursing facility placement.  Assessment/Plan:   Acute confusional state superimposed on advanced dementia: Patient continues to be calm continue melatonin Haldol IV as needed for agitation. No sitter at bedside. No events overnight, patient is awaiting skilled nursing facility placement.  No further work-up needed.  Closed left hip fracture, initial encounter [facility]) Orthopedic surgery was consulted to perform cannulated fixation of the left femoral neck Physical and occupational therapy were consulted recommended skilled nursing facility Awaiting skilled nursing facility placement no further work-up needed.  Chronic diastolic heart failure ([place]): No signs of decompensation will restart diuretic therapy continue beta-blockers.  Essential hypertension Seems fairly controlled continue current regimen.  Controlled type 2 diabetes mellitus without complication, without long-term current use of insulin ([place]) Continue to hold Metformin, continue CBGs before meals and at bedtime blood glucose fairly controlled.  CAD/dyslipidemia: No longer on Lipitor, continue Coreg.  Paroxysmal atrial tachycardia: Try to keep potassium greater than 4 magnesium greater than 2 ischemic sinus rhythm now.  Microcytic anemia/acute blood loss anemia: Hemoglobin this  morning is 7.7 no signs of overt bleeding. Recheck a CBC tomorrow morning she is currently asymptomatic. Check an anemia panel.  Choledocholithiasis: Noted.  Moderate protein caloric malnutrition: Awaiting nutritionist recommendations.  Mild leukocytosis: Likely stress demargination from surgery leukocytosis is resolved.  Goals of care: Palliative care was consulted the patient continues to be a DNR MOST form was filled by the palliative team and appreciate assistance they she would likely benefit from referral to the outpatient palliative care as an outpatient.   DVT prophylaxis: lovenox Family Communication:Daughter Status is: Inpatient  Remains inpatient appropriate because:Hemodynamically unstable   Dispo: The patient is from: SNF              Anticipated d/c is to: SNF              Anticipated d/c date is: 1 days              Patient currently is medically stable to d/c.  Awaiting skilled nursing facility placement.        Code Status:     Code Status Orders  (From admission, onward)         Start     Ordered   10/13/20 2306  Do not attempt resuscitation (DNR)  Continuous       Question Answer Comment  In the event of cardiac or respiratory ARREST Do not call a "code blue"   In the event of cardiac or respiratory ARREST Do not perform Intubation, CPR, defibrillation or ACLS   In the event of cardiac or respiratory ARREST Use medication by any route, position, wound care, and other measures to relive pain and suffering. May use oxygen, suction and manual treatment of airway obstruction as needed for comfort.      10/13/20 2307  Code Status History    Date Active Date Inactive Code Status Order ID Comments User Context   10/13/2020 2308 10/13/2020 2308 DNR [id number]  Reubin Milan, MD ED   10/13/2020 1754 10/13/2020 2307 DNR [id number]  Fredia Sorrow, MD ED   10/01/2020 2017 10/06/2020 2127 DNR [id number]  Elgergawy, Silver Huguenin, MD ED    06/23/2020 0011 06/25/2020 1946 Full Code [id number]  Bernadette Hoit, DO ED   07/26/2017 1630 07/29/2017 1751 Full Code [id number]  Lendon Colonel, NP ED   12/16/2013 1107 12/17/2013 1830 Full Code [id number]  Samuella Cota, MD Inpatient   Advance Care Planning Activity    Advance Directive Documentation     Most Recent Value  Type of Advance Directive Out of facility DNR (pink MOST or yellow form)  Pre-existing out of facility DNR order (yellow form or pink MOST form) --  "MOST" Form in Place? --        IV Access:    Peripheral IV   Procedures and diagnostic studies:   No results found.   Medical Consultants:    None.  Anti-Infectives:   none  Subjective:    Robin Guzman is awake this morning no complaints.  Objective:    Vitals:   10/20/20 0726 10/20/20 1910 10/21/20 0300 10/21/20 0832  BP: 121/60 114/61 (!) 109/53 110/62  Pulse: 65 61 63 65  Resp: 18 16 16 16   Temp: 98 F (36.7 C) 98 F (36.7 C) 97.9 F (36.6 C) 98.2 F (36.8 C)  TempSrc:  Axillary Axillary Axillary  SpO2: 97% 96% 97% 98%  Weight:      Height:       SpO2: 98 % O2 Flow Rate (L/min): 5 L/min   Intake/Output Summary (Last 24 hours) at 10/21/2020 0846 Last data filed at 10/21/2020 0300 Gross per 24 hour  Intake 120 ml  Output 400 ml  Net -280 ml   Filed Weights   10/13/20 1646  Weight: 66 kg    Exam: General exam: In no acute distress. Respiratory system: Good air movement and clear to auscultation. Cardiovascular system: S1 & S2 heard, RRR. No JVD. Gastrointestinal system: Abdomen is nondistended, soft and nontender.  Extremities: No pedal edema. Skin: No rashes, lesions or ulcers   Data Reviewed:    Labs: Basic Metabolic Panel: Recent Labs  Lab 10/14/20 1732 10/15/20 0153 10/15/20 1623 10/16/20 0110  NA  --  140  --  141  K  --  4.0  --  4.4  CL  --  105  --  106  CO2  --  26  --  25  GLUCOSE  --  104*  --  160*  BUN  --  8  --  10  CREATININE  0.88 0.85  --  1.01*  CALCIUM  --  8.8*  --  8.8*  MG  --   --  1.7  --    GFR Estimated Creatinine Clearance: 34.6 mL/min (A) (by C-G formula based on SCr of 1.01 mg/dL (H)). Liver Function Tests: Recent Labs  Lab 10/15/20 0153  AST 32  ALT 19  ALKPHOS 230*  BILITOT 2.6*  PROT 4.4*  ALBUMIN 2.1*   No results for input(s): LIPASE, AMYLASE in the last 168 hours. No results for input(s): AMMONIA in the last 168 hours. Coagulation profile No results for input(s): INR, PROTIME in the last 168 hours. COVID-19 Labs  No results for input(s): DDIMER, FERRITIN, LDH, CRP in the last 72 hours.  Lab Results  Component Value Date   SARSCOV2NAA NEGATIVE 10/13/2020   [place] NEGATIVE 10/01/2020   [place] NEGATIVE 06/24/2020   [place] NEGATIVE 06/22/2020    CBC: Recent Labs  Lab 10/14/20 1732 10/15/20 0153 10/16/20 0110 10/17/20 0328 10/18/20 0158  WBC 10.5 12.0* 10.9* 6.2 5.6  HGB 9.2* 8.8* 8.6* 7.7* 7.8*  HCT 29.4* 28.2* 27.4* 23.7* 25.4*  MCV 99.7 99.6 98.9 100.0 101.2*  PLT 143* 131* 150 121* PLATELET CLUMPS NOTED ON SMEAR, UNABLE TO ESTIMATE   Cardiac Enzymes: No results for input(s): CKTOTAL, CKMB, CKMBINDEX, TROPONINI in the last 168 hours. BNP (last 3 results) No results for input(s): PROBNP in the last 8760 hours. CBG: Recent Labs  Lab 10/20/20 1717 10/20/20 1934 10/20/20 2341 10/21/20 0352 10/21/20 0836  GLUCAP 116* 142* 123* 70 146*   D-Dimer: No results for input(s): DDIMER in the last 72 hours. Hgb A1c: No results for input(s): HGBA1C in the last 72 hours. Lipid Profile: No results for input(s): CHOL, HDL, LDLCALC, TRIG, CHOLHDL, LDLDIRECT in the last 72 hours. Thyroid function studies: No results for input(s): TSH, T4TOTAL, T3FREE, THYROIDAB in the last 72 hours.  Invalid input(s): FREET3 Anemia work up: No results for input(s): VITAMINB12, FOLATE, FERRITIN, TIBC, IRON, RETICCTPCT in the last 72 hours. Sepsis Labs: Recent Labs  Lab  10/15/20 0153 10/16/20 0110 10/17/20 0328 10/18/20 0158  WBC 12.0* 10.9* 6.2 5.6   Microbiology Recent Results (from the past 240 hour(s))  Resp Panel by RT-PCR (Flu A&B, Covid) Nasopharyngeal Swab     Status: None   Collection Time: 10/13/20 10:31 PM   Specimen: Nasopharyngeal Swab; Nasopharyngeal(NP) swabs in vial transport medium  Result Value Ref Range Status   SARS Coronavirus 2 by RT PCR NEGATIVE NEGATIVE Final    Comment: (NOTE) SARS-CoV-2 target nucleic acids are NOT DETECTED.  The SARS-CoV-2 RNA is generally detectable in upper respiratory specimens during the acute phase of infection. The lowest concentration of SARS-CoV-2 viral copies this assay can detect is 138 copies/mL. A negative result does not preclude SARS-Cov-2 infection and should not be used as the sole basis for treatment or other patient management decisions. A negative result may occur with  improper specimen collection/handling, submission of specimen other than nasopharyngeal swab, presence of viral mutation(s) within the areas targeted by this assay, and inadequate number of viral copies(<138 copies/mL). A negative result must be combined with clinical observations, patient history, and epidemiological information. The expected result is Negative.  Fact Sheet for Patients:  EntrepreneurPulse.com.au  Fact Sheet for Healthcare Providers:  IncredibleEmployment.be  This test is no t yet approved or cleared by the [place] FDA and  has been authorized for detection and/or diagnosis of SARS-CoV-2 by FDA under an Emergency Use Authorization (EUA). This EUA will remain  in effect (meaning this test can be used) for the duration of the COVID-19 declaration under Section 564(b)(1) of the Act, 21 U.S.C.section 360bbb-3(b)(1), unless the authorization is terminated  or revoked sooner.       Influenza A by PCR NEGATIVE NEGATIVE Final   Influenza B by PCR NEGATIVE  NEGATIVE Final    Comment: (NOTE) The Xpert Xpress SARS-CoV-2/FLU/RSV plus assay is intended as an aid in the diagnosis of influenza from Nasopharyngeal swab specimens and should not be used as a sole basis for treatment. Nasal washings and aspirates are unacceptable for Xpert Xpress SARS-CoV-2/FLU/RSV testing.  Fact Sheet for Patients: EntrepreneurPulse.com.au  Fact Sheet for Healthcare Providers: IncredibleEmployment.be  This test is not yet approved or  cleared by the [place] and has been authorized for detection and/or diagnosis of SARS-CoV-2 by FDA under an Emergency Use Authorization (EUA). This EUA will remain in effect (meaning this test can be used) for the duration of the COVID-19 declaration under Section 564(b)(1) of the Act, 21 U.S.C. section 360bbb-3(b)(1), unless the authorization is terminated or revoked.  Performed at [facility], [address], [place], [place] [address]      Medications:   . vitamin C  500 mg Oral Daily  . carvedilol  25 mg Oral BID WC  . cholecalciferol  2,000 Units Oral BID  . docusate sodium  100 mg Oral BID  . enoxaparin (LOVENOX) injection  40 mg Subcutaneous Q24H  . famotidine  20 mg Oral BID  . feeding supplement  237 mL Oral BID BM  . insulin aspart  0-9 Units Subcutaneous Q4H  . magnesium oxide  200 mg Oral BID  . melatonin  3 mg Oral QHS  . multivitamin with minerals  1 tablet Oral Daily  . pantoprazole  40 mg Oral BID  . polyethylene glycol  17 g Oral Daily  . senna-docusate  2 tablet Oral BID   Continuous Infusions:     LOS: 8 days   Charlynne Cousins  [facility]  10/21/2020, 8:46 AM

## 2020-10-21 NOTE — Progress Notes (Signed)
Nutrition Follow-up  DOCUMENTATION CODES:   Not applicable  INTERVENTION:   -D/c Ensure Enlive po BID, each supplement provides 350 kcal and 20 grams of protein -30 ml Prosource Plus TID, each supplement provides 100 kcals and 15 grams protein -Magic cup TID with meals, each supplement provides 290 kcal and 9 grams of protein -Continue MVI with minerals daily  NUTRITION DIAGNOSIS:   Increased nutrient needs related to post-op healing as evidenced by estimated needs.  Ongoing  GOAL:   Patient will meet greater than or equal to 90% of their needs  Progressing   MONITOR:   PO intake, Supplement acceptance, Diet advancement, Labs, Weight trends, Skin, I & O's  REASON FOR ASSESSMENT:   Consult Assessment of nutrition requirement/status, Hip fracture protocol  ASSESSMENT:   Robin Guzman is a 86 y.o. female with medical history significant of osteoarthritis, CAD, history of MI, chronic diastolic heart failure, dementia, type 2 diabetes, hyperlipidemia, dyspnea on exertion, hypertension, history of skin cancer, history of cholelithiasis with acute cholestatic hepatitis admitted from November 10 November 15 and underwent ERCP and removal of her CBD stone who is coming from the nursing home due to having a fall at the facility.  11/23- s/p PROCEDURE: 1.CANNULATED SCREW FIXATION OFLEFTFEMORAL NECK FRACTURE WITH 8.0 MM BIOMET CANNULATED SCREWS WITH STAR DRIVE HEADS 2. DRESSING CHANGE UNDER ANESTHESIA FOR LEFT LEG SKIN TEAR 11/24- s/p BSE- downgraded to dysphagia 1 diet with thin liquids 11/30- s/p BSE- advanced to regular diet with thin liquids  Reviewed I/O's: -280 ml x 24 hours and +2.9 L since admission  UOP: 400 ml x 24 hours  Attempted to speak with pt via call to hospital room phone, however, unable to reach.   Pt with poor oral intake; noted meal completion 10-25%. Pt also refusing Ensure supplements. SLP just re-evaluated pt and advanced to regular diet with thin  liquids. Hopeful that this will help improve oral intake.  Medications reviewed and include vitamin C, vitamin D3, colace, magnesium oxide, senokot, miralax, and melatonin.   Per chart review, pt awaiting SNF placement for discharge.  Labs reviewed: CBGS: 1435-146 (inpatient orders for glycemic control are 0-9 units insulin aspart every 4 hours).   Diet Order:   Diet Order            Diet Heart Room service appropriate? Yes; Fluid consistency: Thin  Diet effective now           Diet - low sodium heart healthy                 EDUCATION NEEDS:   No education needs have been identified at this time  Skin:  Skin Assessment: Skin Integrity Issues: Skin Integrity Issues:: Incisions Incisions: closed lt hip, lt foot Other: skin tear lt leg  Last BM:  10/19/20  Height:   Ht Readings from Last 1 Encounters:  10/13/20 5\' 4"  (1.626 m)    Weight:   Wt Readings from Last 1 Encounters:  10/13/20 66 kg    Ideal Body Weight:  54.5 kg  BMI:  Body mass index is 24.98 kg/m.  Estimated Nutritional Needs:   Kcal:  1650-1850  Protein:  80-95 grams  Fluid:  > 1.6 L    Loistine Chance, RD, LDN, [place] Registered Dietitian II Certified Diabetes Care and Education Specialist Please refer to [facility] for RD and/or RD on-call/weekend/after hours pager

## 2020-10-21 NOTE — Progress Notes (Signed)
Physical Therapy Treatment Patient Details Name: Robin Guzman MRN: [id number] DOB: 03/17/1928 Today's Date: 10/21/2020    History of Present Illness Robin Guzman is a 86 y.o. female with medical history significant of osteoarthritis, CAD, history of MI, chronic diastolic heart failure, dementia, type 2 diabetes, hyperlipidemia, dyspnea on exertion, hypertension, history of skin cancer, history of cholelithiasis with acute cholestatic hepatitis admitted from November 10 November 15 and underwent ERCP and removal of her CBD stone who is coming from the nursing home due to having a fall at the facility, sustaining a L hip fx and skin tear; s/p surgery for hip fixation and lac repair; TDWB LLE; Noting restlessness is complicating recovery    PT Comments    Continuing work on functional mobility and activity tolerance;  Session focused on trying to walk and find the best type of cueing to help her keep LLE PWB; some success by cueing to push down through hands on RW; Overall her activity tolerance is impressive -- my concern is that in tolerating getting up and walking too well, she will put too much weight on the LLE and impair healing; continue to recommend SNF for post-acute rehab   Follow Up Recommendations  SNF     Equipment Recommendations  Rolling walker with 5" wheels;3in1 (PT)    Recommendations for Other Services       Precautions / Restrictions Precautions Precautions: Fall;Other (comment) Precaution Comments: very HOH Restrictions LLE Weight Bearing: Partial weight bearing LLE Partial Weight Bearing Percentage or Pounds: 50    Mobility  Bed Mobility Overal bed mobility: Needs Assistance Bed Mobility: Supine to Sit     Supine to sit: Min assist;HOB elevated     General bed mobility comments: Min handheld assist to pull to sit  Transfers Overall transfer level: Needs assistance Equipment used: Rolling walker (2 wheeled);None Transfers: Sit to/from Stand Sit  to Stand: Min assist;+2 safety/equipment;+2 physical assistance         General transfer comment: Close guard and min assist to steady RW as she tended to pull up on it; Multimodal cues for PWB LLE  Ambulation/Gait Ambulation/Gait assistance: Min assist;+2 safety/equipment (chair push) Gait Distance (Feet): 8 Feet Assistive device: Rolling walker (2 wheeled) Gait Pattern/deviations: Step-through pattern;Trunk flexed     General Gait Details: Multimodal cues to push into RW while advancing RLE to keep PWB -- still with difficulty adhering to 50%PWB; able to correct posture with cues; incontinent of urine during walk   Stairs             Wheelchair Mobility    Modified Rankin (Stroke Patients Only)       Balance     Sitting balance-Leahy Scale: Fair       Standing balance-Leahy Scale: Fair Standing balance comment: fair static standing with one UE or no UE support,                             Cognition Arousal/Alertness: Awake/alert Behavior During Therapy: Restless;Impulsive Overall Cognitive Status: History of cognitive impairments - at baseline Area of Impairment: Attention;Memory;Following commands;Safety/judgement;Awareness;Problem solving                     Memory: Decreased recall of precautions;Decreased short-term memory Following Commands: Follows one step commands with increased time Safety/Judgement: Decreased awareness of safety;Decreased awareness of deficits Awareness: Emergent Problem Solving: Difficulty sequencing;Requires verbal cues;Requires tactile cues        Exercises  General Comments General comments (skin integrity, edema, etc.): Assisted with cleanup and changing socks      Pertinent Vitals/Pain Pain Assessment: Faces Faces Pain Scale: Hurts a little bit Pain Location: L hip with getting up Pain Descriptors / Indicators: Grimacing Pain Intervention(s): Monitored during session;Repositioned    Home  Living                      Prior Function            PT Goals (current goals can now be found in the care plan section) Acute Rehab PT Goals Patient Stated Goal: sit in the chair and look out the window PT Goal Formulation: Patient unable to participate in goal setting Time For Goal Achievement: 10/29/20 Potential to Achieve Goals: Good Progress towards PT goals: Progressing toward goals    Frequency    Min 2X/week      PT Plan Current plan remains appropriate    Co-evaluation              AM-PAC PT "6 Clicks" Mobility   Outcome Measure  Help needed turning from your back to your side while in a flat bed without using bedrails?: A Little Help needed moving from lying on your back to sitting on the side of a flat bed without using bedrails?: A Little Help needed moving to and from a bed to a chair (including a wheelchair)?: A Little Help needed standing up from a chair using your arms (e.g., wheelchair or bedside chair)?: A Lot Help needed to walk in hospital room?: A Lot Help needed climbing 3-5 steps with a railing? : Total 6 Click Score: 14    End of Session Equipment Utilized During Treatment: Gait belt Activity Tolerance: Patient tolerated treatment well Patient left: in chair;with call bell/phone within reach;with bed alarm set;Other (comment) ([place] facing window) Nurse Communication: Mobility status PT Visit Diagnosis: Unsteadiness on feet (R26.81);Other abnormalities of gait and mobility (R26.89)     Time: 2423-5361 PT Time Calculation (min) (ACUTE ONLY): 20 min  Charges:  $Gait Training: 8-22 mins                     Roney Marion, [place]  Acute Rehabilitation Services Pager [phone] Office [phone]    [place]ta [place] 10/21/2020, 10:28 AM

## 2020-10-22 DIAGNOSIS — E119 Type 2 diabetes mellitus without complications: Secondary | ICD-10-CM | POA: Diagnosis not present

## 2020-10-22 DIAGNOSIS — S72002A Fracture of unspecified part of neck of left femur, initial encounter for closed fracture: Secondary | ICD-10-CM | POA: Diagnosis not present

## 2020-10-22 DIAGNOSIS — E440 Moderate protein-calorie malnutrition: Secondary | ICD-10-CM | POA: Diagnosis not present

## 2020-10-22 DIAGNOSIS — E559 Vitamin D deficiency, unspecified: Secondary | ICD-10-CM | POA: Diagnosis not present

## 2020-10-22 DIAGNOSIS — S32019A Unspecified fracture of first lumbar vertebra, initial encounter for closed fracture: Secondary | ICD-10-CM | POA: Diagnosis not present

## 2020-10-22 DIAGNOSIS — I2510 Atherosclerotic heart disease of native coronary artery without angina pectoris: Secondary | ICD-10-CM | POA: Diagnosis not present

## 2020-10-22 DIAGNOSIS — M150 Primary generalized (osteo)arthritis: Secondary | ICD-10-CM | POA: Diagnosis not present

## 2020-10-22 DIAGNOSIS — R58 Hemorrhage, not elsewhere classified: Secondary | ICD-10-CM | POA: Diagnosis not present

## 2020-10-22 DIAGNOSIS — M6281 Muscle weakness (generalized): Secondary | ICD-10-CM | POA: Diagnosis not present

## 2020-10-22 DIAGNOSIS — K8050 Calculus of bile duct without cholangitis or cholecystitis without obstruction: Secondary | ICD-10-CM | POA: Diagnosis not present

## 2020-10-22 DIAGNOSIS — R404 Transient alteration of awareness: Secondary | ICD-10-CM | POA: Diagnosis not present

## 2020-10-22 DIAGNOSIS — D649 Anemia, unspecified: Secondary | ICD-10-CM | POA: Diagnosis not present

## 2020-10-22 DIAGNOSIS — D519 Vitamin B12 deficiency anemia, unspecified: Secondary | ICD-10-CM | POA: Diagnosis not present

## 2020-10-22 DIAGNOSIS — S7292XD Unspecified fracture of left femur, subsequent encounter for closed fracture with routine healing: Secondary | ICD-10-CM | POA: Diagnosis not present

## 2020-10-22 DIAGNOSIS — K219 Gastro-esophageal reflux disease without esophagitis: Secondary | ICD-10-CM | POA: Diagnosis not present

## 2020-10-22 DIAGNOSIS — R262 Difficulty in walking, not elsewhere classified: Secondary | ICD-10-CM | POA: Diagnosis not present

## 2020-10-22 DIAGNOSIS — I10 Essential (primary) hypertension: Secondary | ICD-10-CM | POA: Diagnosis not present

## 2020-10-22 DIAGNOSIS — S72002D Fracture of unspecified part of neck of left femur, subsequent encounter for closed fracture with routine healing: Secondary | ICD-10-CM | POA: Diagnosis not present

## 2020-10-22 DIAGNOSIS — E1142 Type 2 diabetes mellitus with diabetic polyneuropathy: Secondary | ICD-10-CM | POA: Diagnosis not present

## 2020-10-22 DIAGNOSIS — R531 Weakness: Secondary | ICD-10-CM | POA: Diagnosis not present

## 2020-10-22 DIAGNOSIS — I739 Peripheral vascular disease, unspecified: Secondary | ICD-10-CM | POA: Diagnosis not present

## 2020-10-22 DIAGNOSIS — E785 Hyperlipidemia, unspecified: Secondary | ICD-10-CM | POA: Diagnosis not present

## 2020-10-22 DIAGNOSIS — I110 Hypertensive heart disease with heart failure: Secondary | ICD-10-CM | POA: Diagnosis not present

## 2020-10-22 DIAGNOSIS — Z743 Need for continuous supervision: Secondary | ICD-10-CM | POA: Diagnosis not present

## 2020-10-22 DIAGNOSIS — M80052A Age-related osteoporosis with current pathological fracture, left femur, initial encounter for fracture: Secondary | ICD-10-CM | POA: Diagnosis not present

## 2020-10-22 DIAGNOSIS — D509 Iron deficiency anemia, unspecified: Secondary | ICD-10-CM | POA: Diagnosis not present

## 2020-10-22 DIAGNOSIS — S81802D Unspecified open wound, left lower leg, subsequent encounter: Secondary | ICD-10-CM | POA: Diagnosis not present

## 2020-10-22 DIAGNOSIS — I479 Paroxysmal tachycardia, unspecified: Secondary | ICD-10-CM | POA: Diagnosis not present

## 2020-10-22 DIAGNOSIS — I5032 Chronic diastolic (congestive) heart failure: Secondary | ICD-10-CM | POA: Diagnosis not present

## 2020-10-22 DIAGNOSIS — R1312 Dysphagia, oropharyngeal phase: Secondary | ICD-10-CM | POA: Diagnosis not present

## 2020-10-22 DIAGNOSIS — Z4789 Encounter for other orthopedic aftercare: Secondary | ICD-10-CM | POA: Diagnosis not present

## 2020-10-22 DIAGNOSIS — Z7401 Bed confinement status: Secondary | ICD-10-CM | POA: Diagnosis not present

## 2020-10-22 DIAGNOSIS — I509 Heart failure, unspecified: Secondary | ICD-10-CM | POA: Diagnosis not present

## 2020-10-22 DIAGNOSIS — M2550 Pain in unspecified joint: Secondary | ICD-10-CM | POA: Diagnosis not present

## 2020-10-22 LAB — GLUCOSE, CAPILLARY
Glucose-Capillary: 99 mg/dL (ref 70–99)
Glucose-Capillary: 121 mg/dL — ABNORMAL HIGH (ref 70–99)
Glucose-Capillary: 124 mg/dL — ABNORMAL HIGH (ref 70–99)
Glucose-Capillary: 148 mg/dL — ABNORMAL HIGH (ref 70–99)

## 2020-10-22 LAB — PREPARE RBC (CROSSMATCH)

## 2020-10-22 MED ORDER — SODIUM CHLORIDE 0.9% IV SOLUTION: Freq: Once | INTRAVENOUS | Status: AC

## 2020-10-22 NOTE — Progress Notes (Signed)
Physical Therapy Treatment Patient Details Name: Robin Guzman MRN: [id number] DOB: June 04, 1928 Today's Date: 10/22/2020    History of Present Illness SHAUNTIA LEVENGOOD is a 86 y.o. female with medical history significant of osteoarthritis, CAD, history of MI, chronic diastolic heart failure, dementia, type 2 diabetes, hyperlipidemia, dyspnea on exertion, hypertension, history of skin cancer, history of cholelithiasis with acute cholestatic hepatitis admitted from November 10 November 15 and underwent ERCP and removal of her CBD stone who is coming from the nursing home due to having a fall at the facility, sustaining a L hip fx and skin tear; s/p surgery for hip fixation and lac repair; TDWB LLE; Noting restlessness is complicating recovery    PT Comments    Pt supine on arrival, confused but agreeable to therapy session and with fair tolerance for mobility. Pt performed supine BLE/BUE AAROM therapeutic exercises with good tolerance as detailed below, needing multimodal cues due to cognitive deficit. Pt limited due to loose bowel movement and rolled with min guard assist multiple reps, dependent for peri-care/bed cleanup. Pt performed supine to long sit transition x3 reps with min guard assist, able to sit 1-2 minutes upright with BUE suppport of rails prior to returning to supine, deferred further OOB mobility due to blood transfusion and RN entering room to assist with IV at end of session.   Follow Up Recommendations  SNF     Equipment Recommendations  Rolling walker with 5" wheels;3in1 (PT)    Recommendations for Other Services       Precautions / Restrictions Precautions Precautions: Fall Precaution Comments: very HOH Restrictions Weight Bearing Restrictions: Yes LLE Weight Bearing: Partial weight bearing LLE Partial Weight Bearing Percentage or Pounds: 50 Other Position/Activity Restrictions: PWB (50%)    Mobility  Bed Mobility Overal bed mobility: Needs Assistance Bed  Mobility: Rolling;Supine to Sit Rolling: Min guard   Supine to sit: Min guard     General bed mobility comments: pt rolling L/R x2 reps ea side for cleanup after using bed pan; pt min guard to long sit in bed x3 reps but fatigues quickly and impulsive to lie back in bed unless given minA for support; deferred further mobility 2/2 pt blood transfusion beeping (likely complete), RN notified  Transfers                    Ambulation/Gait                 Stairs             Wheelchair Mobility    Modified Rankin (Stroke Patients Only)       Balance Overall balance assessment: Needs assistance;History of Falls Sitting-balance support: Feet supported;No upper extremity supported Sitting balance-Leahy Scale: Poor Sitting balance - Comments: pt tending to posterior lean unless pulling up on bed side rails in long sit position; deferred EOB 2/2 pt blood transfusion almost complete and pt with confusion following commands this afternoon                                    Cognition Arousal/Alertness: Awake/alert Behavior During Therapy: Restless;Impulsive Overall Cognitive Status: History of cognitive impairments - at baseline Area of Impairment: Attention;Memory;Following commands;Safety/judgement;Awareness;Problem solving                     Memory: Decreased recall of precautions;Decreased short-term memory Following Commands: Follows one step commands with increased time Safety/Judgement:  Decreased awareness of safety;Decreased awareness of deficits   Problem Solving: Difficulty sequencing;Requires verbal cues;Requires tactile cues General Comments: cognition vs very HOH when it comes to difficulty following commands/answering questions. Pt with impaired safety, impulsive and benefits from safety cues      Exercises General Exercises - Lower Extremity Ankle Circles/Pumps: AAROM;Strengthening;Both;10 reps;Supine Short Arc Quad:  AAROM;Strengthening;Both;10 reps;Supine Heel Slides: AAROM;Strengthening;Both;10 reps;Supine Hip ABduction/ADduction: AAROM;Strengthening;Both;10 reps;Supine Straight Leg Raises: AAROM;Strengthening;Both;10 reps;Supine Other Exercises Other Exercises: BUE AROM: shoulder flexion w deep breaths 1x10 reps ea    General Comments General comments (skin integrity, edema, etc.): bruising to LLE hip to ankles; blood infusing, pt endorses dizziness but very HoH and unable to describe      Pertinent Vitals/Pain Pain Assessment: No/denies pain Pain Intervention(s): Monitored during session;Repositioned    Home Living                      Prior Function            PT Goals (current goals can now be found in the care plan section) Acute Rehab PT Goals Patient Stated Goal: to go home PT Goal Formulation: Patient unable to participate in goal setting Time For Goal Achievement: 10/29/20 Potential to Achieve Goals: Good Progress towards PT goals: Progressing toward goals    Frequency    Min 2X/week      PT Plan Current plan remains appropriate    Co-evaluation              AM-PAC PT "6 Clicks" Mobility   Outcome Measure  Help needed turning from your back to your side while in a flat bed without using bedrails?: A Little Help needed moving from lying on your back to sitting on the side of a flat bed without using bedrails?: A Little Help needed moving to and from a bed to a chair (including a wheelchair)?: A Little Help needed standing up from a chair using your arms (e.g., wheelchair or bedside chair)?: A Lot Help needed to walk in hospital room?: A Lot Help needed climbing 3-5 steps with a railing? : Total 6 Click Score: 14    End of Session   Activity Tolerance: Patient limited by fatigue Patient left: in bed;with call bell/phone within reach;with bed alarm set;with nursing/sitter in room (RN entering room) Nurse Communication: Mobility status PT Visit  Diagnosis: Unsteadiness on feet (R26.81);Other abnormalities of gait and mobility (R26.89)     Time: 4128-7867 PT Time Calculation (min) (ACUTE ONLY): 28 min  Charges:  $Therapeutic Exercise: 8-22 mins $Therapeutic Activity: 8-22 mins                     Allexus Ovens P., PTA Acute Rehabilitation Services Pager: [phone] Office: [place] 10/22/2020, 5:07 PM

## 2020-10-22 NOTE — Progress Notes (Addendum)
Mrs. Rison has remained hemodynamically stable, currently she is receiving 1 unit packed red blood cells with good toleration.  Her last hemoglobin was 7.8. Post operative acute blood loss anemia.   Her vital signs are stable with a temperature of 98.6, blood pressure 148/60, heart rate 60, respiratory rate 18, oxygen saturation 97% on room air.  Her physical examination has not changed.  Plan to transfer to SNF after completing PRBC transfusion.

## 2020-10-22 NOTE — Progress Notes (Signed)
Report called to [facility], [place]  @ [phone]

## 2020-10-22 NOTE — TOC Transition Note (Signed)
Transition of Care [facility]) - CM/SW Discharge Note   Patient Details  Name: Robin Guzman MRN: [id number] Date of Birth: January 07, 1928  [facility]) CM/SW Contact:  Emeterio Reeve, [place] Phone Number: 10/22/2020, 3:10 PM   Clinical Narrative:     Pt to discharge to [facility] via ptar. Pt is covid negative. Josem Kaufmann has been approved. Pts daughter [name] has been notified.   Nurse to call report to [phone].   CSW will sign off.  Final next level of care: Skilled Nursing Facility Barriers to Discharge: Continued Medical Work up   Patient Goals and CMS Choice Patient states their goals for this hospitalization and ongoing recovery are:: Patient from ALF - family would like SNF placement for rehab after surgery. CMS Medicare.gov Compare Post Acute Care list provided to:: Patient Represenative (must comment) (daughter - Alberteen Sam) Choice offered to / list presented to : Adult Children  Discharge Placement              Patient chooses bed at: [facility] Patient to be transferred to facility by: ptar Name of family member notified: [name], daughter Patient and family notified of of transfer: 10/22/20  Discharge Plan and Services In-house Referral: Hospice / [place] (daughter requesting outpatient palliative through [facility]) Discharge Planning Services: CM Consult Post Acute Care Choice: [place]                               Social Determinants of Health (SDOH) Interventions     Readmission Risk Interventions Readmission Risk Prevention Plan 10/17/2020  Transportation Screening Complete  Medication Review ([place]) Complete  PCP or Specialist appointment within 3-5 days of discharge Complete  HRI or [place] Complete  SW Recovery Care/Counseling Consult Complete  Palliative Care Screening Complete  [place] Complete  Some recent data might be hidden     Emeterio Reeve, Latanya Presser, [place] Social Worker [phone]

## 2020-10-23 DIAGNOSIS — E119 Type 2 diabetes mellitus without complications: Secondary | ICD-10-CM | POA: Diagnosis not present

## 2020-10-23 DIAGNOSIS — S72002A Fracture of unspecified part of neck of left femur, initial encounter for closed fracture: Secondary | ICD-10-CM | POA: Diagnosis not present

## 2020-10-23 DIAGNOSIS — I10 Essential (primary) hypertension: Secondary | ICD-10-CM | POA: Diagnosis not present

## 2020-10-23 DIAGNOSIS — I5032 Chronic diastolic (congestive) heart failure: Secondary | ICD-10-CM | POA: Diagnosis not present

## 2020-10-23 LAB — BPAM RBC
Blood Product Expiration Date: 202112312359
ISSUE DATE / TIME: 202112011242
Unit Type and Rh: 6200

## 2020-10-23 LAB — TYPE AND SCREEN
ABO/RH(D): A POS
Antibody Screen: NEGATIVE
Unit division: 0

## 2020-10-27 DIAGNOSIS — D649 Anemia, unspecified: Secondary | ICD-10-CM | POA: Diagnosis not present

## 2020-10-27 DIAGNOSIS — E559 Vitamin D deficiency, unspecified: Secondary | ICD-10-CM | POA: Diagnosis not present

## 2020-10-27 DIAGNOSIS — E119 Type 2 diabetes mellitus without complications: Secondary | ICD-10-CM | POA: Diagnosis not present

## 2020-10-27 DIAGNOSIS — I10 Essential (primary) hypertension: Secondary | ICD-10-CM | POA: Diagnosis not present

## 2020-10-27 DIAGNOSIS — S81802D Unspecified open wound, left lower leg, subsequent encounter: Secondary | ICD-10-CM | POA: Diagnosis not present

## 2020-10-29 DIAGNOSIS — D649 Anemia, unspecified: Secondary | ICD-10-CM | POA: Diagnosis not present

## 2020-10-29 DIAGNOSIS — E119 Type 2 diabetes mellitus without complications: Secondary | ICD-10-CM | POA: Diagnosis not present

## 2020-10-29 DIAGNOSIS — I10 Essential (primary) hypertension: Secondary | ICD-10-CM | POA: Diagnosis not present

## 2020-10-29 DIAGNOSIS — E559 Vitamin D deficiency, unspecified: Secondary | ICD-10-CM | POA: Diagnosis not present

## 2020-11-07 DIAGNOSIS — I509 Heart failure, unspecified: Secondary | ICD-10-CM | POA: Diagnosis not present

## 2020-11-07 DIAGNOSIS — I10 Essential (primary) hypertension: Secondary | ICD-10-CM | POA: Diagnosis not present

## 2020-11-07 DIAGNOSIS — R531 Weakness: Secondary | ICD-10-CM | POA: Diagnosis not present

## 2020-11-07 DIAGNOSIS — S72002A Fracture of unspecified part of neck of left femur, initial encounter for closed fracture: Secondary | ICD-10-CM | POA: Diagnosis not present

## 2020-11-07 DIAGNOSIS — S32019A Unspecified fracture of first lumbar vertebra, initial encounter for closed fracture: Secondary | ICD-10-CM | POA: Diagnosis not present

## 2020-11-07 DIAGNOSIS — I5032 Chronic diastolic (congestive) heart failure: Secondary | ICD-10-CM | POA: Diagnosis not present

## 2020-11-09 DIAGNOSIS — S7292XD Unspecified fracture of left femur, subsequent encounter for closed fracture with routine healing: Secondary | ICD-10-CM | POA: Diagnosis not present

## 2020-11-09 DIAGNOSIS — Z4789 Encounter for other orthopedic aftercare: Secondary | ICD-10-CM | POA: Diagnosis not present

## 2020-11-09 DIAGNOSIS — E440 Moderate protein-calorie malnutrition: Secondary | ICD-10-CM | POA: Diagnosis not present

## 2020-11-09 DIAGNOSIS — M6281 Muscle weakness (generalized): Secondary | ICD-10-CM | POA: Diagnosis not present

## 2020-11-09 DIAGNOSIS — R262 Difficulty in walking, not elsewhere classified: Secondary | ICD-10-CM | POA: Diagnosis not present

## 2020-11-09 DIAGNOSIS — R1312 Dysphagia, oropharyngeal phase: Secondary | ICD-10-CM | POA: Diagnosis not present

## 2020-11-10 DIAGNOSIS — Z4789 Encounter for other orthopedic aftercare: Secondary | ICD-10-CM | POA: Diagnosis not present

## 2020-11-10 DIAGNOSIS — R262 Difficulty in walking, not elsewhere classified: Secondary | ICD-10-CM | POA: Diagnosis not present

## 2020-11-10 DIAGNOSIS — S7292XD Unspecified fracture of left femur, subsequent encounter for closed fracture with routine healing: Secondary | ICD-10-CM | POA: Diagnosis not present

## 2020-11-10 DIAGNOSIS — R1312 Dysphagia, oropharyngeal phase: Secondary | ICD-10-CM | POA: Diagnosis not present

## 2020-11-10 DIAGNOSIS — M6281 Muscle weakness (generalized): Secondary | ICD-10-CM | POA: Diagnosis not present

## 2020-11-10 DIAGNOSIS — E440 Moderate protein-calorie malnutrition: Secondary | ICD-10-CM | POA: Diagnosis not present

## 2020-11-11 DIAGNOSIS — M6281 Muscle weakness (generalized): Secondary | ICD-10-CM | POA: Diagnosis not present

## 2020-11-11 DIAGNOSIS — Z4789 Encounter for other orthopedic aftercare: Secondary | ICD-10-CM | POA: Diagnosis not present

## 2020-11-11 DIAGNOSIS — R1312 Dysphagia, oropharyngeal phase: Secondary | ICD-10-CM | POA: Diagnosis not present

## 2020-11-11 DIAGNOSIS — S7292XD Unspecified fracture of left femur, subsequent encounter for closed fracture with routine healing: Secondary | ICD-10-CM | POA: Diagnosis not present

## 2020-11-11 DIAGNOSIS — R262 Difficulty in walking, not elsewhere classified: Secondary | ICD-10-CM | POA: Diagnosis not present

## 2020-11-11 DIAGNOSIS — E440 Moderate protein-calorie malnutrition: Secondary | ICD-10-CM | POA: Diagnosis not present

## 2020-11-12 DIAGNOSIS — M6281 Muscle weakness (generalized): Secondary | ICD-10-CM | POA: Diagnosis not present

## 2020-11-12 DIAGNOSIS — Z4789 Encounter for other orthopedic aftercare: Secondary | ICD-10-CM | POA: Diagnosis not present

## 2020-11-12 DIAGNOSIS — R1312 Dysphagia, oropharyngeal phase: Secondary | ICD-10-CM | POA: Diagnosis not present

## 2020-11-12 DIAGNOSIS — E440 Moderate protein-calorie malnutrition: Secondary | ICD-10-CM | POA: Diagnosis not present

## 2020-11-12 DIAGNOSIS — S7292XD Unspecified fracture of left femur, subsequent encounter for closed fracture with routine healing: Secondary | ICD-10-CM | POA: Diagnosis not present

## 2020-11-12 DIAGNOSIS — R262 Difficulty in walking, not elsewhere classified: Secondary | ICD-10-CM | POA: Diagnosis not present

## 2020-11-13 DIAGNOSIS — R1312 Dysphagia, oropharyngeal phase: Secondary | ICD-10-CM | POA: Diagnosis not present

## 2020-11-13 DIAGNOSIS — E440 Moderate protein-calorie malnutrition: Secondary | ICD-10-CM | POA: Diagnosis not present

## 2020-11-13 DIAGNOSIS — R262 Difficulty in walking, not elsewhere classified: Secondary | ICD-10-CM | POA: Diagnosis not present

## 2020-11-13 DIAGNOSIS — M6281 Muscle weakness (generalized): Secondary | ICD-10-CM | POA: Diagnosis not present

## 2020-11-13 DIAGNOSIS — Z4789 Encounter for other orthopedic aftercare: Secondary | ICD-10-CM | POA: Diagnosis not present

## 2020-11-13 DIAGNOSIS — S7292XD Unspecified fracture of left femur, subsequent encounter for closed fracture with routine healing: Secondary | ICD-10-CM | POA: Diagnosis not present

## 2020-11-14 DIAGNOSIS — M6281 Muscle weakness (generalized): Secondary | ICD-10-CM | POA: Diagnosis not present

## 2020-11-14 DIAGNOSIS — E440 Moderate protein-calorie malnutrition: Secondary | ICD-10-CM | POA: Diagnosis not present

## 2020-11-14 DIAGNOSIS — R262 Difficulty in walking, not elsewhere classified: Secondary | ICD-10-CM | POA: Diagnosis not present

## 2020-11-14 DIAGNOSIS — S7292XD Unspecified fracture of left femur, subsequent encounter for closed fracture with routine healing: Secondary | ICD-10-CM | POA: Diagnosis not present

## 2020-11-14 DIAGNOSIS — Z4789 Encounter for other orthopedic aftercare: Secondary | ICD-10-CM | POA: Diagnosis not present

## 2020-11-14 DIAGNOSIS — R1312 Dysphagia, oropharyngeal phase: Secondary | ICD-10-CM | POA: Diagnosis not present

## 2020-11-17 DIAGNOSIS — R1312 Dysphagia, oropharyngeal phase: Secondary | ICD-10-CM | POA: Diagnosis not present

## 2020-11-17 DIAGNOSIS — S7292XD Unspecified fracture of left femur, subsequent encounter for closed fracture with routine healing: Secondary | ICD-10-CM | POA: Diagnosis not present

## 2020-11-17 DIAGNOSIS — R262 Difficulty in walking, not elsewhere classified: Secondary | ICD-10-CM | POA: Diagnosis not present

## 2020-11-17 DIAGNOSIS — Z4789 Encounter for other orthopedic aftercare: Secondary | ICD-10-CM | POA: Diagnosis not present

## 2020-11-17 DIAGNOSIS — S72002A Fracture of unspecified part of neck of left femur, initial encounter for closed fracture: Secondary | ICD-10-CM | POA: Diagnosis not present

## 2020-11-17 DIAGNOSIS — M6281 Muscle weakness (generalized): Secondary | ICD-10-CM | POA: Diagnosis not present

## 2020-11-17 DIAGNOSIS — E440 Moderate protein-calorie malnutrition: Secondary | ICD-10-CM | POA: Diagnosis not present

## 2020-11-18 DIAGNOSIS — S7292XD Unspecified fracture of left femur, subsequent encounter for closed fracture with routine healing: Secondary | ICD-10-CM | POA: Diagnosis not present

## 2020-11-18 DIAGNOSIS — R1312 Dysphagia, oropharyngeal phase: Secondary | ICD-10-CM | POA: Diagnosis not present

## 2020-11-18 DIAGNOSIS — E440 Moderate protein-calorie malnutrition: Secondary | ICD-10-CM | POA: Diagnosis not present

## 2020-11-18 DIAGNOSIS — R262 Difficulty in walking, not elsewhere classified: Secondary | ICD-10-CM | POA: Diagnosis not present

## 2020-11-18 DIAGNOSIS — Z4789 Encounter for other orthopedic aftercare: Secondary | ICD-10-CM | POA: Diagnosis not present

## 2020-11-18 DIAGNOSIS — M6281 Muscle weakness (generalized): Secondary | ICD-10-CM | POA: Diagnosis not present

## 2020-11-19 DIAGNOSIS — S7292XD Unspecified fracture of left femur, subsequent encounter for closed fracture with routine healing: Secondary | ICD-10-CM | POA: Diagnosis not present

## 2020-11-19 DIAGNOSIS — R262 Difficulty in walking, not elsewhere classified: Secondary | ICD-10-CM | POA: Diagnosis not present

## 2020-11-19 DIAGNOSIS — R1312 Dysphagia, oropharyngeal phase: Secondary | ICD-10-CM | POA: Diagnosis not present

## 2020-11-19 DIAGNOSIS — M6281 Muscle weakness (generalized): Secondary | ICD-10-CM | POA: Diagnosis not present

## 2020-11-19 DIAGNOSIS — Z4789 Encounter for other orthopedic aftercare: Secondary | ICD-10-CM | POA: Diagnosis not present

## 2020-11-19 DIAGNOSIS — E440 Moderate protein-calorie malnutrition: Secondary | ICD-10-CM | POA: Diagnosis not present

## 2020-11-21 DIAGNOSIS — E559 Vitamin D deficiency, unspecified: Secondary | ICD-10-CM | POA: Diagnosis not present

## 2020-11-21 DIAGNOSIS — D519 Vitamin B12 deficiency anemia, unspecified: Secondary | ICD-10-CM | POA: Diagnosis not present

## 2020-11-21 DIAGNOSIS — I10 Essential (primary) hypertension: Secondary | ICD-10-CM | POA: Diagnosis not present

## 2020-11-21 DIAGNOSIS — E119 Type 2 diabetes mellitus without complications: Secondary | ICD-10-CM | POA: Diagnosis not present

## 2020-11-25 DIAGNOSIS — I10 Essential (primary) hypertension: Secondary | ICD-10-CM | POA: Diagnosis not present

## 2020-11-25 DIAGNOSIS — D649 Anemia, unspecified: Secondary | ICD-10-CM | POA: Diagnosis not present

## 2020-11-25 DIAGNOSIS — K219 Gastro-esophageal reflux disease without esophagitis: Secondary | ICD-10-CM | POA: Diagnosis not present

## 2020-11-25 DIAGNOSIS — I2510 Atherosclerotic heart disease of native coronary artery without angina pectoris: Secondary | ICD-10-CM | POA: Diagnosis not present

## 2020-11-25 DIAGNOSIS — E119 Type 2 diabetes mellitus without complications: Secondary | ICD-10-CM | POA: Diagnosis not present

## 2020-11-25 DIAGNOSIS — E785 Hyperlipidemia, unspecified: Secondary | ICD-10-CM | POA: Diagnosis not present

## 2020-11-25 DIAGNOSIS — S72002A Fracture of unspecified part of neck of left femur, initial encounter for closed fracture: Secondary | ICD-10-CM | POA: Diagnosis not present

## 2020-12-02 DIAGNOSIS — S72002A Fracture of unspecified part of neck of left femur, initial encounter for closed fracture: Secondary | ICD-10-CM | POA: Diagnosis not present

## 2020-12-03 DIAGNOSIS — G301 Alzheimer's disease with late onset: Secondary | ICD-10-CM | POA: Diagnosis not present

## 2020-12-08 DIAGNOSIS — I509 Heart failure, unspecified: Secondary | ICD-10-CM | POA: Diagnosis not present

## 2020-12-08 DIAGNOSIS — R531 Weakness: Secondary | ICD-10-CM | POA: Diagnosis not present

## 2020-12-08 DIAGNOSIS — S32019A Unspecified fracture of first lumbar vertebra, initial encounter for closed fracture: Secondary | ICD-10-CM | POA: Diagnosis not present

## 2020-12-09 DIAGNOSIS — G301 Alzheimer's disease with late onset: Secondary | ICD-10-CM | POA: Diagnosis not present

## 2021-01-02 ENCOUNTER — Emergency Department (HOSPITAL_COMMUNITY): Payer: Medicare Other

## 2021-01-02 ENCOUNTER — Encounter (HOSPITAL_COMMUNITY): Payer: Self-pay

## 2021-01-02 ENCOUNTER — Other Ambulatory Visit: Payer: Self-pay

## 2021-01-02 ENCOUNTER — Inpatient Hospital Stay (HOSPITAL_COMMUNITY)
Admission: EM | Admit: 2021-01-02 | Discharge: 2021-01-20 | DRG: 689 | Disposition: E | Payer: Medicare Other | Attending: Internal Medicine | Admitting: Internal Medicine

## 2021-01-02 DIAGNOSIS — Z515 Encounter for palliative care: Secondary | ICD-10-CM

## 2021-01-02 DIAGNOSIS — F419 Anxiety disorder, unspecified: Secondary | ICD-10-CM | POA: Diagnosis present

## 2021-01-02 DIAGNOSIS — I10 Essential (primary) hypertension: Secondary | ICD-10-CM | POA: Diagnosis not present

## 2021-01-02 DIAGNOSIS — E875 Hyperkalemia: Secondary | ICD-10-CM | POA: Diagnosis present

## 2021-01-02 DIAGNOSIS — R400 Somnolence: Secondary | ICD-10-CM

## 2021-01-02 DIAGNOSIS — D509 Iron deficiency anemia, unspecified: Secondary | ICD-10-CM | POA: Diagnosis present

## 2021-01-02 DIAGNOSIS — S8011XD Contusion of right lower leg, subsequent encounter: Secondary | ICD-10-CM

## 2021-01-02 DIAGNOSIS — E8809 Other disorders of plasma-protein metabolism, not elsewhere classified: Secondary | ICD-10-CM | POA: Diagnosis present

## 2021-01-02 DIAGNOSIS — Z90710 Acquired absence of both cervix and uterus: Secondary | ICD-10-CM

## 2021-01-02 DIAGNOSIS — N390 Urinary tract infection, site not specified: Principal | ICD-10-CM | POA: Diagnosis present

## 2021-01-02 DIAGNOSIS — S300XXD Contusion of lower back and pelvis, subsequent encounter: Secondary | ICD-10-CM

## 2021-01-02 DIAGNOSIS — I447 Left bundle-branch block, unspecified: Secondary | ICD-10-CM | POA: Diagnosis present

## 2021-01-02 DIAGNOSIS — I5032 Chronic diastolic (congestive) heart failure: Secondary | ICD-10-CM | POA: Diagnosis present

## 2021-01-02 DIAGNOSIS — W19XXXD Unspecified fall, subsequent encounter: Secondary | ICD-10-CM | POA: Diagnosis present

## 2021-01-02 DIAGNOSIS — S32019A Unspecified fracture of first lumbar vertebra, initial encounter for closed fracture: Secondary | ICD-10-CM | POA: Diagnosis not present

## 2021-01-02 DIAGNOSIS — Z888 Allergy status to other drugs, medicaments and biological substances status: Secondary | ICD-10-CM

## 2021-01-02 DIAGNOSIS — R0902 Hypoxemia: Secondary | ICD-10-CM | POA: Diagnosis not present

## 2021-01-02 DIAGNOSIS — E785 Hyperlipidemia, unspecified: Secondary | ICD-10-CM | POA: Diagnosis not present

## 2021-01-02 DIAGNOSIS — Z4802 Encounter for removal of sutures: Secondary | ICD-10-CM

## 2021-01-02 DIAGNOSIS — G9341 Metabolic encephalopathy: Secondary | ICD-10-CM | POA: Diagnosis present

## 2021-01-02 DIAGNOSIS — K219 Gastro-esophageal reflux disease without esophagitis: Secondary | ICD-10-CM | POA: Diagnosis present

## 2021-01-02 DIAGNOSIS — E1122 Type 2 diabetes mellitus with diabetic chronic kidney disease: Secondary | ICD-10-CM | POA: Diagnosis not present

## 2021-01-02 DIAGNOSIS — Z85828 Personal history of other malignant neoplasm of skin: Secondary | ICD-10-CM

## 2021-01-02 DIAGNOSIS — Z7982 Long term (current) use of aspirin: Secondary | ICD-10-CM

## 2021-01-02 DIAGNOSIS — F05 Delirium due to known physiological condition: Secondary | ICD-10-CM | POA: Diagnosis present

## 2021-01-02 DIAGNOSIS — M160 Bilateral primary osteoarthritis of hip: Secondary | ICD-10-CM | POA: Diagnosis not present

## 2021-01-02 DIAGNOSIS — F0390 Unspecified dementia without behavioral disturbance: Secondary | ICD-10-CM | POA: Diagnosis present

## 2021-01-02 DIAGNOSIS — Z743 Need for continuous supervision: Secondary | ICD-10-CM | POA: Diagnosis not present

## 2021-01-02 DIAGNOSIS — S40022A Contusion of left upper arm, initial encounter: Secondary | ICD-10-CM | POA: Diagnosis present

## 2021-01-02 DIAGNOSIS — R4189 Other symptoms and signs involving cognitive functions and awareness: Secondary | ICD-10-CM | POA: Diagnosis present

## 2021-01-02 DIAGNOSIS — E7800 Pure hypercholesterolemia, unspecified: Secondary | ICD-10-CM | POA: Diagnosis not present

## 2021-01-02 DIAGNOSIS — S8011XA Contusion of right lower leg, initial encounter: Secondary | ICD-10-CM | POA: Diagnosis present

## 2021-01-02 DIAGNOSIS — S300XXA Contusion of lower back and pelvis, initial encounter: Secondary | ICD-10-CM | POA: Diagnosis not present

## 2021-01-02 DIAGNOSIS — Z6820 Body mass index (BMI) 20.0-20.9, adult: Secondary | ICD-10-CM

## 2021-01-02 DIAGNOSIS — D631 Anemia in chronic kidney disease: Secondary | ICD-10-CM | POA: Diagnosis not present

## 2021-01-02 DIAGNOSIS — S71112D Laceration without foreign body, left thigh, subsequent encounter: Secondary | ICD-10-CM

## 2021-01-02 DIAGNOSIS — T7401XA Adult neglect or abandonment, confirmed, initial encounter: Secondary | ICD-10-CM | POA: Diagnosis present

## 2021-01-02 DIAGNOSIS — N139 Obstructive and reflux uropathy, unspecified: Secondary | ICD-10-CM | POA: Diagnosis present

## 2021-01-02 DIAGNOSIS — Z20822 Contact with and (suspected) exposure to covid-19: Secondary | ICD-10-CM | POA: Diagnosis present

## 2021-01-02 DIAGNOSIS — R4182 Altered mental status, unspecified: Secondary | ICD-10-CM | POA: Diagnosis present

## 2021-01-02 DIAGNOSIS — E440 Moderate protein-calorie malnutrition: Secondary | ICD-10-CM | POA: Diagnosis not present

## 2021-01-02 DIAGNOSIS — S40022D Contusion of left upper arm, subsequent encounter: Secondary | ICD-10-CM

## 2021-01-02 DIAGNOSIS — D539 Nutritional anemia, unspecified: Secondary | ICD-10-CM

## 2021-01-02 DIAGNOSIS — R079 Chest pain, unspecified: Secondary | ICD-10-CM | POA: Diagnosis not present

## 2021-01-02 DIAGNOSIS — S40021D Contusion of right upper arm, subsequent encounter: Secondary | ICD-10-CM

## 2021-01-02 DIAGNOSIS — I2510 Atherosclerotic heart disease of native coronary artery without angina pectoris: Secondary | ICD-10-CM | POA: Diagnosis not present

## 2021-01-02 DIAGNOSIS — I700 Atherosclerosis of aorta: Secondary | ICD-10-CM | POA: Diagnosis not present

## 2021-01-02 DIAGNOSIS — E1169 Type 2 diabetes mellitus with other specified complication: Secondary | ICD-10-CM | POA: Diagnosis present

## 2021-01-02 DIAGNOSIS — N179 Acute kidney failure, unspecified: Secondary | ICD-10-CM | POA: Diagnosis present

## 2021-01-02 DIAGNOSIS — I509 Heart failure, unspecified: Secondary | ICD-10-CM | POA: Diagnosis not present

## 2021-01-02 DIAGNOSIS — R9431 Abnormal electrocardiogram [ECG] [EKG]: Secondary | ICD-10-CM | POA: Diagnosis present

## 2021-01-02 DIAGNOSIS — D696 Thrombocytopenia, unspecified: Secondary | ICD-10-CM | POA: Diagnosis present

## 2021-01-02 DIAGNOSIS — I130 Hypertensive heart and chronic kidney disease with heart failure and stage 1 through stage 4 chronic kidney disease, or unspecified chronic kidney disease: Secondary | ICD-10-CM | POA: Diagnosis present

## 2021-01-02 DIAGNOSIS — Z808 Family history of malignant neoplasm of other organs or systems: Secondary | ICD-10-CM

## 2021-01-02 DIAGNOSIS — R404 Transient alteration of awareness: Secondary | ICD-10-CM | POA: Diagnosis not present

## 2021-01-02 DIAGNOSIS — Z66 Do not resuscitate: Secondary | ICD-10-CM | POA: Diagnosis not present

## 2021-01-02 DIAGNOSIS — Z794 Long term (current) use of insulin: Secondary | ICD-10-CM

## 2021-01-02 DIAGNOSIS — Z79899 Other long term (current) drug therapy: Secondary | ICD-10-CM

## 2021-01-02 DIAGNOSIS — E119 Type 2 diabetes mellitus without complications: Secondary | ICD-10-CM

## 2021-01-02 DIAGNOSIS — W19XXXA Unspecified fall, initial encounter: Secondary | ICD-10-CM | POA: Diagnosis present

## 2021-01-02 DIAGNOSIS — N1832 Chronic kidney disease, stage 3b: Secondary | ICD-10-CM | POA: Diagnosis present

## 2021-01-02 DIAGNOSIS — R531 Weakness: Secondary | ICD-10-CM | POA: Diagnosis not present

## 2021-01-02 DIAGNOSIS — E876 Hypokalemia: Secondary | ICD-10-CM | POA: Diagnosis present

## 2021-01-02 DIAGNOSIS — I252 Old myocardial infarction: Secondary | ICD-10-CM

## 2021-01-02 DIAGNOSIS — Z886 Allergy status to analgesic agent status: Secondary | ICD-10-CM

## 2021-01-02 DIAGNOSIS — D531 Other megaloblastic anemias, not elsewhere classified: Secondary | ICD-10-CM | POA: Diagnosis not present

## 2021-01-02 DIAGNOSIS — E860 Dehydration: Secondary | ICD-10-CM | POA: Diagnosis present

## 2021-01-02 DIAGNOSIS — S40021A Contusion of right upper arm, initial encounter: Secondary | ICD-10-CM | POA: Diagnosis present

## 2021-01-02 DIAGNOSIS — I959 Hypotension, unspecified: Secondary | ICD-10-CM | POA: Diagnosis not present

## 2021-01-02 DIAGNOSIS — Z8249 Family history of ischemic heart disease and other diseases of the circulatory system: Secondary | ICD-10-CM

## 2021-01-02 DIAGNOSIS — E1165 Type 2 diabetes mellitus with hyperglycemia: Secondary | ICD-10-CM | POA: Diagnosis present

## 2021-01-02 DIAGNOSIS — F32A Depression, unspecified: Secondary | ICD-10-CM | POA: Diagnosis present

## 2021-01-02 DIAGNOSIS — Z8719 Personal history of other diseases of the digestive system: Secondary | ICD-10-CM

## 2021-01-02 DIAGNOSIS — S8012XA Contusion of left lower leg, initial encounter: Secondary | ICD-10-CM | POA: Diagnosis present

## 2021-01-02 DIAGNOSIS — Z7984 Long term (current) use of oral hypoglycemic drugs: Secondary | ICD-10-CM

## 2021-01-02 DIAGNOSIS — S72002D Fracture of unspecified part of neck of left femur, subsequent encounter for closed fracture with routine healing: Secondary | ICD-10-CM | POA: Diagnosis not present

## 2021-01-02 DIAGNOSIS — S8012XD Contusion of left lower leg, subsequent encounter: Secondary | ICD-10-CM

## 2021-01-02 LAB — CBC WITH DIFFERENTIAL/PLATELET
Abs Immature Granulocytes: 0.02 10*3/uL (ref 0.00–0.07)
Basophils Absolute: 0 10*3/uL (ref 0.0–0.1)
Basophils Relative: 0 %
Eosinophils Absolute: 0 10*3/uL (ref 0.0–0.5)
Eosinophils Relative: 0 %
HCT: 33.1 % — ABNORMAL LOW (ref 36.0–46.0)
Hemoglobin: 10.5 g/dL — ABNORMAL LOW (ref 12.0–15.0)
Immature Granulocytes: 0 %
Lymphocytes Relative: 6 %
Lymphs Abs: 0.5 10*3/uL — ABNORMAL LOW (ref 0.7–4.0)
MCH: 32.8 pg (ref 26.0–34.0)
MCHC: 31.7 g/dL (ref 30.0–36.0)
MCV: 103.4 fL — ABNORMAL HIGH (ref 80.0–100.0)
Monocytes Absolute: 0.3 10*3/uL (ref 0.1–1.0)
Monocytes Relative: 3 %
Neutro Abs: 8.3 10*3/uL — ABNORMAL HIGH (ref 1.7–7.7)
Neutrophils Relative %: 91 %
Platelets: 116 10*3/uL — ABNORMAL LOW (ref 150–400)
RBC: 3.2 MIL/uL — ABNORMAL LOW (ref 3.87–5.11)
RDW: 16.3 % — ABNORMAL HIGH (ref 11.5–15.5)
WBC: 9.2 10*3/uL (ref 4.0–10.5)
nRBC: 0 % (ref 0.0–0.2)

## 2021-01-02 LAB — URINALYSIS, ROUTINE W REFLEX MICROSCOPIC
Bilirubin Urine: NEGATIVE
Glucose, UA: NEGATIVE mg/dL
Hgb urine dipstick: NEGATIVE
Ketones, ur: 5 mg/dL — AB
Nitrite: NEGATIVE
Protein, ur: NEGATIVE mg/dL
Specific Gravity, Urine: 1.015 (ref 1.005–1.030)
pH: 5 (ref 5.0–8.0)

## 2021-01-02 LAB — COMPREHENSIVE METABOLIC PANEL
ALT: 14 U/L (ref 0–44)
AST: 23 U/L (ref 15–41)
Albumin: 3.1 g/dL — ABNORMAL LOW (ref 3.5–5.0)
Alkaline Phosphatase: 98 U/L (ref 38–126)
Anion gap: 12 (ref 5–15)
BUN: 36 mg/dL — ABNORMAL HIGH (ref 8–23)
CO2: 21 mmol/L — ABNORMAL LOW (ref 22–32)
Calcium: 9 mg/dL (ref 8.9–10.3)
Chloride: 104 mmol/L (ref 98–111)
Creatinine, Ser: 1.86 mg/dL — ABNORMAL HIGH (ref 0.44–1.00)
GFR, Estimated: 25 mL/min — ABNORMAL LOW (ref >60)
Glucose, Bld: 137 mg/dL — ABNORMAL HIGH (ref 70–99)
Potassium: 5.5 mmol/L — ABNORMAL HIGH (ref 3.5–5.1)
Sodium: 137 mmol/L (ref 135–145)
Total Bilirubin: 1.4 mg/dL — ABNORMAL HIGH (ref 0.3–1.2)
Total Protein: 6.2 g/dL — ABNORMAL LOW (ref 6.5–8.1)

## 2021-01-02 LAB — CBG MONITORING, ED
Glucose-Capillary: 134 mg/dL — ABNORMAL HIGH (ref 70–99)
Glucose-Capillary: 89 mg/dL (ref 70–99)

## 2021-01-02 LAB — SARS CORONAVIRUS 2 (TAT 6-24 HRS): SARS Coronavirus 2: NEGATIVE

## 2021-01-02 LAB — PHOSPHORUS: Phosphorus: 5.2 mg/dL — ABNORMAL HIGH (ref 2.5–4.6)

## 2021-01-02 LAB — MAGNESIUM: Magnesium: 2 mg/dL (ref 1.7–2.4)

## 2021-01-02 LAB — AMMONIA: Ammonia: 16 umol/L (ref 9–35)

## 2021-01-02 MED ORDER — LORAZEPAM 2 MG/ML IJ SOLN
INTRAMUSCULAR | Status: AC
  Administered 2021-01-02: 1 mg via INTRAVENOUS
  Filled 2021-01-02: qty 1

## 2021-01-02 MED ORDER — PANTOPRAZOLE SODIUM 40 MG IV SOLR
40.0000 mg | INTRAVENOUS | Status: DC
  Administered 2021-01-02 – 2021-01-04 (×3): 40 mg via INTRAVENOUS
  Filled 2021-01-02 (×4): qty 40

## 2021-01-02 MED ORDER — LACTATED RINGERS IV BOLUS
1000.0000 mL | Freq: Once | INTRAVENOUS | Status: AC
  Administered 2021-01-02: 1000 mL via INTRAVENOUS

## 2021-01-02 MED ORDER — DEXTROSE IN LACTATED RINGERS 5 % IV SOLN: INTRAVENOUS | Status: DC

## 2021-01-02 MED ORDER — INSULIN ASPART 100 UNIT/ML ~~LOC~~ SOLN: 0.0000 [IU] | SUBCUTANEOUS | Status: DC

## 2021-01-02 MED ORDER — HEPARIN SODIUM (PORCINE) 5000 UNIT/ML IJ SOLN
5000.0000 [IU] | Freq: Three times a day (TID) | INTRAMUSCULAR | Status: DC
  Administered 2021-01-02 – 2021-01-04 (×7): 5000 [IU] via SUBCUTANEOUS
  Filled 2021-01-02 (×7): qty 1

## 2021-01-02 MED ORDER — SODIUM CHLORIDE 0.9 % IV SOLN
1.0000 g | INTRAVENOUS | Status: DC
  Administered 2021-01-02 – 2021-01-04 (×3): 1 g via INTRAVENOUS
  Filled 2021-01-02 (×3): qty 10

## 2021-01-02 MED ORDER — LORAZEPAM 2 MG/ML IJ SOLN
INTRAMUSCULAR | Status: AC
  Administered 2021-01-02: 2 mg
  Filled 2021-01-02: qty 1

## 2021-01-02 MED ORDER — CARVEDILOL 12.5 MG PO TABS
25.0000 mg | ORAL_TABLET | Freq: Two times a day (BID) | ORAL | Status: DC
  Filled 2021-01-02: qty 8
  Filled 2021-01-02: qty 2

## 2021-01-02 MED ORDER — DIVALPROEX SODIUM 250 MG PO DR TAB
250.0000 mg | DELAYED_RELEASE_TABLET | Freq: Two times a day (BID) | ORAL | Status: DC
  Filled 2021-01-02 (×2): qty 1

## 2021-01-02 MED ORDER — DIVALPROEX SODIUM 125 MG PO CSDR
125.0000 mg | DELAYED_RELEASE_CAPSULE | Freq: Every day | ORAL | Status: DC
  Filled 2021-01-02: qty 1

## 2021-01-02 MED ORDER — ATORVASTATIN CALCIUM 10 MG PO TABS
10.0000 mg | ORAL_TABLET | Freq: Every day | ORAL | Status: DC
  Filled 2021-01-02: qty 1

## 2021-01-02 MED ORDER — LORAZEPAM 2 MG/ML IJ SOLN: 0.5000 mg | Freq: Four times a day (QID) | INTRAMUSCULAR | Status: DC | PRN

## 2021-01-02 MED ORDER — ASPIRIN EC 81 MG PO TBEC
81.0000 mg | DELAYED_RELEASE_TABLET | Freq: Every day | ORAL | Status: DC
Start: 2021-01-03 — End: 2021-01-04
  Filled 2021-01-02: qty 1

## 2021-01-02 MED ORDER — MORPHINE SULFATE (PF) 2 MG/ML IV SOLN
2.0000 mg | INTRAVENOUS | Status: DC | PRN
  Administered 2021-01-02 – 2021-01-03 (×3): 2 mg via INTRAVENOUS
  Filled 2021-01-02 (×3): qty 1

## 2021-01-02 MED ORDER — LORAZEPAM 2 MG/ML IJ SOLN
1.0000 mg | INTRAMUSCULAR | Status: DC | PRN
  Administered 2021-01-02 – 2021-01-03 (×2): 1 mg via INTRAVENOUS
  Filled 2021-01-02 (×3): qty 1

## 2021-01-02 MED ORDER — FERROUS SULFATE 325 (65 FE) MG PO TABS
325.0000 mg | ORAL_TABLET | Freq: Every day | ORAL | Status: DC
  Filled 2021-01-02: qty 1

## 2021-01-02 MED ORDER — LORAZEPAM BOLUS VIA INFUSION: 0.5000 mg | INTRAVENOUS | Status: DC | PRN

## 2021-01-02 NOTE — ED Notes (Signed)
Assumed care of this patient at this time. At the time of assuming care, patient changed into clean linens and repositioned in bed to laying on left side. This RN notes that patient is responsive to staff in room, moaning and moving all extremities without distress. Pt unable to answer questions at this time. Bed is locked in the lowest position, side rails x2, cardiac monitor in place and vital signs cycling q3 hours. Room lights dimmed and curtain remains open for better visualization and remains in room closest to nurses station.

## 2021-01-02 NOTE — TOC Initial Note (Signed)
Transition of Care [facility]) - Initial/Assessment Note    Robin Guzman Details  Name: Robin Guzman MRN: [id number] Date of Birth: 12-11-1927  Transition [facility]) CM/SW Contact:    Natasha Bence, LCSW Phone Number: 01/09/2021, 2:23 PM  Clinical Narrative:                 Robin Guzman is a 87 year old female admitted for closed hip fracture. CSW observed Robin Guzman's high readmission risk score. CSW conducted Readmission prevention assessment. CSW also conducted initial assessment. Robin Guzman's family reported that Robin Guzman is from [place] and they would prefer for her to return to facility with PT instead of SNF. Robin Guzman's family reported that Robin Guzman's dementia exacerbates when she goes to a different facility. Robin Guzman's family reported that Robin Guzman had also attended [facility] during 2021. Family agreeable to reaccess for skilled needs after pt eval. TOC to follow.  Expected Discharge Plan: Assisted Living Barriers to Discharge: Continued Medical Work up   Robin Guzman Goals and CMS Choice Robin Guzman states their goals for this hospitalization and ongoing recovery are:: Return home to ALF with PT CMS Medicare.gov Compare Post Acute Care list provided to:: Robin Guzman Choice offered to / list presented to : Robin Guzman  Expected Discharge Plan and Services Expected Discharge Plan: Assisted Living In-house Referral: NA Discharge Planning Services: CM Consult Post Acute Care Choice: [place] arrangements for the past 2 months: [place] Expected Discharge Date: 10/22/20               DME Arranged: N/A DME Agency: NA       HH Arranged: PT,RN [place]: [place] Date [place]: 12/24/2020 Time [place]: 1416 Representative spoke with at [place]: [place] Arrangements/Services Living arrangements for the past 2 months: [place] Lives with:: Self Robin Guzman language and need for interpreter reviewed:: No Do you feel safe going  back to the place where you live?: Yes      Need for Family Participation in Robin Guzman Care: Yes (Comment) Care giver support system in place?: No (comment) Current home services: DME (uses WC at facility) Criminal Activity/Legal Involvement Pertinent to Current Situation/Hospitalization: Yes - Comment as needed  Activities of Daily Living Home Assistive Devices/Equipment: Wheelchair ADL Screening (condition at time of admission) Robin Guzman's cognitive ability adequate to safely complete daily activities?: No Is the Robin Guzman deaf or have difficulty hearing?: Yes Does the Robin Guzman have difficulty seeing, even when wearing glasses/contacts?: No Does the Robin Guzman have difficulty concentrating, remembering, or making decisions?: Yes Robin Guzman able to express need for assistance with ADLs?: Yes Does the Robin Guzman have difficulty dressing or bathing?: No Independently performs ADLs?: No Does the Robin Guzman have difficulty walking or climbing stairs?: Yes Weakness of Legs: Both Weakness of Arms/Hands: None  Permission Sought/Granted Permission sought to share information with : Family Supports Permission granted to share information with : Yes, Verbal Permission Granted  Share Information with NAME: Dabbs,Carolyn  Permission granted to share info w AGENCY: [place] granted to share info w Relationship: Daughter  Permission granted to share info w Contact Information: [phone]  Emotional Assessment Appearance:: Appears stated age Attitude/Demeanor/Rapport: Reactive Affect (typically observed): Other (comment) (Robin Guzman with dementia - sitter at bedside) Orientation: : Fluctuating Orientation (Suspected and/or reported Sundowners),Oriented to Self Alcohol / Substance Use: Not Applicable Psych Involvement: No (comment)  Admission diagnosis:  Closed fracture of left hip, initial encounter ([place]) [S72.002A] Fall, initial encounter [W19.XXXA] Closed left hip fracture, initial encounter ([name])  [S72.002A]  Dementia with behavioral disturbance, unspecified dementia type ([name]) [F03.91] Robin Guzman Active Problem List   Diagnosis Date Noted  . Altered mental status 12/25/2020  . Prolonged QT interval 01/10/2021  . GERD (gastroesophageal reflux disease) 12/29/2020  . Hypoalbuminemia 12/27/2020  . Hyperkalemia 12/25/2020  . Hypophosphatemia 12/28/2020  . AKI (acute kidney injury) ([place]) 12/24/2020  . Dehydration 12/24/2020  . Acute metabolic encephalopathy 79/15/0569  . Dementia with behavioral disturbance ([place])   . Surgery, elective   . Palliative care by specialist   . Goals of care, counseling/discussion   . Closed left hip fracture, initial encounter ([place]) 10/13/2020  . Moderate protein malnutrition ([place]) 10/13/2020  . Choledocholithiasis 10/01/2020  . Compression fracture of L1 vertebra (HCC) 06/23/2020  . Leukocytosis 06/23/2020  . Macrocytic anemia 06/23/2020  . Hyperglycemia due to diabetes mellitus ([place]) 06/23/2020  . Thrombocytopenia ([place]) 06/23/2020  . Back pain 06/23/2020  . Fall 06/23/2020  . SVT (supraventricular tachycardia) ([place]) 10/23/2018  . PAT (paroxysmal atrial tachycardia) ([place]) 04/23/2018  . LBBB (left bundle branch block) 04/23/2018  . Dyslipidemia 04/23/2018  . Atherosclerosis of aorta ([place]) 04/23/2018  . Acute systolic heart failure ([place]) 07/28/2017  . Sundowning 07/28/2017  . NSTEMI (non-ST elevated myocardial infarction) ([place]) 07/26/2017  . Closed 3-part fracture of proximal humerus, left, with routine healing, subsequent encounter 12/02/2016  . UTI (urinary tract infection) 12/25/2013  . Dyspnea 12/25/2013  . Near syncope 12/25/2013  . CAD (coronary artery disease) 12/25/2013  . Chronic diastolic heart failure ([place]) 08/07/2013  . Essential hypertension 08/07/2013  . Controlled type 2 diabetes mellitus without complication, without long-term current use of insulin ([place]) 08/07/2013  . Hypercholesteremia 08/07/2013   PCP:  Celene Squibb,  MD Pharmacy:   [facility] - [place], [place] - [address] [place] [address] Phone: [phone] Fax: [phone]     Social Determinants of Health (SDOH) Interventions    Readmission Risk Interventions Readmission Risk Prevention Plan 01/03/2021 10/17/2020  Transportation Screening Complete Complete  Medication Review ([place]) Complete Complete  PCP or Specialist appointment within 3-5 days of discharge Complete Complete  HRI or Home Care Consult Complete Complete  SW Recovery Care/Counseling Consult Complete Complete  Palliative Care Screening Not Applicable Complete  Skilled Nursing Facility Complete Complete  Some recent data might be hidden
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
## 2021-01-02 NOTE — H&P (Signed)
History and Physical  Robin Guzman WER:[id number] DOB: [birth year] DOA: 01/17/2021  Referring physician: Delora Fuel, MD PCP: Celene Squibb, MD  Patient coming from: Home  Chief Complaint: Altered mental status  HPI: Robin Guzman is a 87 y.o. female with medical history significant for osteoarthritis, CAD with a history of an MI, chronic diastolic heart failure, dementia, diabetes mellitus type 2, essential hypertension, hyperlipidemia, choledocholithiasis s/p ERCP with removal of CBD stone who presents to the emergency department via EMS home due to altered mental status.  Patient was unable to provide history, history was obtained from ED physician and ED medical record.  Per report, family put her to bed at 11 PM and checked on her at midnight where she was noted to be sitting in a chair and confused.  Patient is usually normally oriented at baseline.  ED Course: In the emergency department, she was hemodynamically stable.  Work-up in the ED showed microcytic anemia, thrombocytopenia, hypokalemia, BUN to creatinine 36/1.86 (baseline creatinine at 0.9-1.1), hyperphosphatemia, hypoalbuminemia. CT of head showed no acute intracranial abnormality Chest x-ray showed no active disease Hospitalist was asked to admit patient for further evaluation and management.  Review of Systems: This cannot be obtained at this time due to patient's altered mental status  Past Medical History:  Diagnosis Date  . Arthritis   . CAD (coronary artery disease), native coronary artery    07/27/17 nonobstructive, EF 25-35% by LV gram  . CHF (congestive heart failure) ([place])   . Dementia ([place])   . Diabetes mellitus without complication ([place])   . Dyslipidemia   . Dyspnea   . Dyspnea on exertion   . Gallstone 09/2020  . History of stress test 07/05/2006   High risk scan cardiac cathe was recommended.  [name] Hx of echocardiogram 04/09/2011   EF 55% Mildly hypertrophic left ventricle with normal systolic  function, Moderate (grade II) diastolic dysfunction. Elevated left atrial pressure. No significant valvular abnormalities. No pericardial effusion. Mild to moderate pulmonary arterial hypertension.  . Hypercholesteremia   . Hypertension   . Myocardial infarct ([place])   . Skin cancer    HISTORY OF    Past Surgical History:  Procedure Laterality Date  . ABDOMINAL HYSTERECTOMY    . CATARACT EXTRACTION W/PHACO Right 05/20/2017   Procedure: CATARACT EXTRACTION PHACO AND INTRAOCULAR LENS PLACEMENT RIGHT EYE;  Surgeon: Tonny Branch, MD;  Location: [facility];  Service: Ophthalmology;  Laterality: Right;  CDE: 17.77  . CATARACT EXTRACTION W/PHACO Left 06/21/2017   Procedure: CATARACT EXTRACTION PHACO AND INTRAOCULAR LENS PLACEMENT (IOC);  Surgeon: Tonny Branch, MD;  Location: [facility];  Service: Ophthalmology;  Laterality: Left;  CDE: 8.90  . ERCP N/A 10/03/2020   Procedure: ENDOSCOPIC RETROGRADE CHOLANGIOPANCREATOGRAPHY (ERCP);  Surgeon: Arta Silence, MD;  Location: [facility] ENDOSCOPY;  Service: Endoscopy;  Laterality: N/A;  . HIP PINNING,CANNULATED Left 10/14/2020   Procedure: CANNULATED HIP PINNING;  Surgeon: Altamese Clay, MD;  Location: [place];  Service: Orthopedics;  Laterality: Left;  . LEFT HEART CATH AND CORONARY ANGIOGRAPHY N/A 07/27/2017   Procedure: LEFT HEART CATH AND CORONARY ANGIOGRAPHY;  Surgeon: Wellington Hampshire, MD;  Location: [place] [facility] LAB;  Service: Cardiovascular;  Laterality: N/A;  . REMOVAL OF STONES  10/03/2020   Procedure: REMOVAL OF STONES;  Surgeon: Arta Silence, MD;  Location: [facility] ENDOSCOPY;  Service: Endoscopy;;  . SPHINCTEROTOMY  10/03/2020   Procedure: SPHINCTEROTOMY;  Surgeon: Arta Silence, MD;  Location: [facility] ENDOSCOPY;  Service: Endoscopy;;    Social History:  reports  that she has never smoked. She has never used smokeless tobacco. She reports that she does not drink alcohol and does not use drugs.   Allergies  Allergen Reactions  . Daypro [Oxaprozin] Shortness Of  Breath and Other (See Comments)    weakness  . Advil [Ibuprofen] Other (See Comments)    weakness    Family History  Problem Relation Age of Onset  . Cancer Mother   . Heart Problems Father   . Cancer Brother        brain  . Cancer Sister        brain    Prior to Admission medications   Medication Sig Start Date End Date Taking? Authorizing Provider  acetaminophen (TYLENOL) 325 MG tablet Take 1-2 tablets (325-650 mg total) by mouth every 6 (six) hours as needed for mild pain or moderate pain (or Fever >/= 101). 10/17/20   Ainsley Spinner, PA-C  ascorbic acid (VITAMIN C) 500 MG tablet Take 1 tablet (500 mg total) by mouth daily. 10/18/20   Ainsley Spinner, PA-C  aspirin EC 81 MG tablet Take 1 tablet (81 mg total) by mouth daily with breakfast. 06/25/20   Denton Brick, Courage, MD  atorvastatin (LIPITOR) 10 MG tablet Take 10 mg by mouth daily.    [provider]  carvedilol (COREG) 25 MG tablet Take 1 tablet (25 mg total) by mouth 2 (two) times daily with a meal. 10/23/18   Croitoru, Mihai, MD  cholecalciferol (VITAMIN D) 25 MCG tablet Take 2 tablets (2,000 Units total) by mouth daily. 10/17/20   Ainsley Spinner, PA-C  famotidine (PEPCID) 20 MG tablet Take 20 mg by mouth 2 (two) times daily.    [provider]  ferrous sulfate 325 (65 FE) MG tablet Take 325 mg by mouth daily.    [provider]  HYDROcodone-acetaminophen (NORCO/VICODIN) 5-325 MG tablet Take 0.5 tablets by mouth every 4 (four) hours as needed for moderate pain or severe pain. 10/20/20   Charlynne Cousins, MD  insulin aspart (NOVOLOG) 100 UNIT/ML FlexPen Inject 0-6 Units into the skin 3 (three) times daily with meals. insulin aspart (novoLOG) injection 0-6 Units 0-6 Units Subcutaneous, 3 times daily with meals CBG < 70: Implement Hypoglycemia Standing Orders and refer to Hypoglycemia Standing Orders sidebar report  CBG 70 - 120: 0 unit CBG 121 - 150: 0 unit  CBG 151 - 200: 0 unit CBG 201 - 250: 1 units CBG 251 - 300:   2units CBG 301 - 350:  3 units  CBG 351 - 400:  5 units  CBG > 400: 6 units Patient not taking: Reported on 10/01/2020 06/25/20   Roxan Hockey, MD  LORazepam (ATIVAN) 0.5 MG tablet Take 1 tablet (0.5 mg total) by mouth every 12 (twelve) hours as needed for anxiety or sleep. 06/25/20 06/25/21  Roxan Hockey, MD  metFORMIN (GLUCOPHAGE) 500 MG tablet Take 500 mg by mouth 2 (two) times daily with a meal.    [provider]  Multiple Vitamins-Minerals (CENTRUM ADULTS) TABS Take 1 tablet by mouth daily. Patient not taking: Reported on 10/13/2020    [provider]  pantoprazole (PROTONIX) 40 MG tablet Take 1 tablet (40 mg total) by mouth 2 (two) times daily. 10/06/20   Swayze, Ava, DO  polyethylene glycol (MIRALAX) 17 g packet Take 17 g by mouth daily. 06/25/20   Emokpae, Courage, MD  senna-docusate (SENOKOT-S) 8.6-50 MG tablet Take 2 tablets by mouth 2 (two) times daily. 06/25/20   Roxan Hockey, MD  Physical Exam: BP (!) 115/59   Pulse 71   Temp 97.9 F (36.6 C) (Rectal)   Resp 10   Ht 5\' 4"  (1.626 m)   Wt 53.4 kg   SpO2 97%   BMI 20.21 kg/m   . General: 87 y.o. year-old female well developed well nourished in no acute distress.  Alert and oriented x3. [name] HEENT: Dry mucous membrane.  NCAT, EOMI . Neck: Supple, trachea medial . Cardiovascular: Regular rate and rhythm with no rubs or gallops.  No thyromegaly or JVD noted.  No lower extremity edema. 2/4 pulses in all 4 extremities. [name] Respiratory: Clear to auscultation with no wheezes or rales. Good inspiratory effort. . Abdomen: Soft nontender nondistended with normal bowel sounds x4 quadrants. . Muskuloskeletal: Multiple ecchymosis noted on all extremities and in the sacral area.  No cyanosis, clubbing or edema noted bilaterally . Neuro: This cannot be evaluated at this time due to patient's altered mental status . Skin: No ulcerative lesions noted or rashes . Psychiatry: This cannot be evaluated at this time due to  patient's altered mental status         Labs on Admission:  Basic Metabolic Panel: Recent Labs  Lab 01/17/2021 0104  NA 137  K 5.5*  CL 104  CO2 21*  GLUCOSE 137*  BUN 36*  CREATININE 1.86*  CALCIUM 9.0  MG 2.0  PHOS 5.2*   Liver Function Tests: Recent Labs  Lab 12/29/2020 0104  AST 23  ALT 14  ALKPHOS 98  BILITOT 1.4*  PROT 6.2*  ALBUMIN 3.1*   No results for input(s): LIPASE, AMYLASE in the last 168 hours. Recent Labs  Lab 01/14/2021 0104  AMMONIA 16   CBC: Recent Labs  Lab 01/07/2021 0104  WBC 9.2  NEUTROABS 8.3*  HGB 10.5*  HCT 33.1*  MCV 103.4*  PLT 116*   Cardiac Enzymes: No results for input(s): CKTOTAL, CKMB, CKMBINDEX, TROPONINI in the last 168 hours.  BNP (last 3 results) No results for input(s): BNP in the last 8760 hours.  ProBNP (last 3 results) No results for input(s): PROBNP in the last 8760 hours.  CBG: Recent Labs  Lab 01/05/2021 0111  GLUCAP 134*    Radiological Exams on Admission: CT Head Wo Contrast  Result Date: 01/12/2021 CLINICAL DATA:  Altered mental status EXAM: CT HEAD WITHOUT CONTRAST TECHNIQUE: Contiguous axial images were obtained from the base of the skull through the vertex without intravenous contrast. COMPARISON:  10/13/2020 FINDINGS: Brain: Age related volume loss. Mild chronic small vessel disease throughout the deep white matter. No acute intracranial abnormality. Specifically, no hemorrhage, hydrocephalus, mass lesion, acute infarction, or significant intracranial injury. Vascular: No hyperdense vessel or unexpected calcification. Skull: No acute calvarial abnormality. Sinuses/Orbits: Mucous retention cyst in the right maxillary sinus. No acute findings. Other: None IMPRESSION: Atrophy, chronic microvascular disease. No acute intracranial abnormality. Electronically Signed   By: Rolm Baptise M.D.   On: 01/14/2021 01:59   DG Pelvis Portable  Result Date: 01/17/2021 CLINICAL DATA:  Bruising. EXAM: PORTABLE PELVIS 1-2 VIEWS  COMPARISON:  October 14, 2020 FINDINGS: The patient has undergone prior percutaneous pinning of the left hip. There is no new acute displaced fracture or dislocation. There is diffuse osteopenia which limits detection of nondisplaced fractures. There are degenerative changes of both hips. IMPRESSION: Negative. Electronically Signed   By: Constance Holster M.D.   On: 01/12/2021 01:52   DG Chest Port 1 View  Result Date: 01/16/2021 CLINICAL DATA:  Pain EXAM: PORTABLE CHEST 1  VIEW COMPARISON:  October 13, 2020 FINDINGS: The heart size and mediastinal contours are within normal limits. Both lungs are clear. The visualized skeletal structures are unremarkable. Aortic calcifications are noted. IMPRESSION: No active disease. Electronically Signed   By: Constance Holster M.D.   On: 12/23/2020 01:54    EKG: I independently viewed the EKG done and my findings are as followed: Normal sinus rhythm at rate of 99 bpm with LBBB and prolonged QTc (560 ms)  Assessment/Plan Present on Admission: . Altered mental status . CAD (coronary artery disease) . Chronic diastolic heart failure ([place]) . Essential hypertension . Hypercholesteremia . Thrombocytopenia ([place])  Principal Problem:   Altered mental status Active Problems:   Chronic diastolic heart failure (HCC)   Essential hypertension   Controlled type 2 diabetes mellitus without complication, without long-term current use of insulin (HCC)   Hypercholesteremia   CAD (coronary artery disease)   Thrombocytopenia (H[facility])   Prolonged QT interval   GERD (gastroesophageal reflux disease)   Hypoalbuminemia   Hyperkalemia   Hypophosphatemia   AKI (acute kidney injury) ([place])   Altered mental status possibly secondary to multifactorial The cause of patient's altered mental status unknown at this time, urinalysis was normal, ammonia level was normal.  Urine drug screen will be checked Continue fall precaution and neurochecks Patient is currently n.p.o.,  resume  Prolonged QT interval (560 ms) Avoid QT prolonging drugs Magnesium level will be checked  Acute kidney injury/dehydration BUN to creatinine 36/1.86 (baseline creatinine at 0.9-1.1) This is possibly prerenal Gentle hydration will be provided Renally adjust medications, avoid nephrotoxic agents/dehydration/hypotension  Hyperkalemia K+ 5.5, EKG with prolonged QT, but no peaked T waves Magnesium level was normal IV hydration provided Continue to monitor K+ with morning labs.  Hypoalbuminemia possibly secondary to mild protein calorie malnutrition Albumin 3.1, protein supplement will be provided when patient is more alert  Thrombocytopenia (chronic)-stable Platelets 116, continue to monitor platelets with morning labs  Essential hypertension (controlled) Continue home meds when med rec is updated  Hyperglycemia due to type 2 diabetes mellitus Continue ISS and hypoglycemia protocol  CAD Continue home meds when med rec is updated  Hyperlipidemia Continue home meds when med rec is updated  Chronic diastolic CHF Continue home meds when med rec is updated  GERD Continue Protonix   DVT prophylaxis: Heparin subcu  Code Status: DNR  Family Communication: None at bedside  Disposition Plan:  Patient is from:                        home Anticipated DC to:                   SNF or family members home Anticipated DC date:               2-3 days Anticipated DC barriers:           Patient needs inpatient management due to altered mental status with pending further work-up   Consults called: None  Admission status: Inpatient    Bernadette Hoit MD [facility]  01/10/2021, 6:07 AM

## 2021-01-02 NOTE — ED Notes (Signed)
New orders obtained for patient agitation and restlessness.  Pt remains attempting to get out of bed at this time. Bed remains locked in the lowest position, side rails x2, seizure

## 2021-01-02 NOTE — ED Triage Notes (Signed)
[facility]- pt family states that they put pt down for bed tonight at 11pm tonight and checked on her at 12 and pt was sitting in chair and confused. Family says pt is usually A&O and "feisty".

## 2021-01-02 NOTE — ED Notes (Signed)
Unable to obtain CBG due to patient's restlessness and agitation.

## 2021-01-02 NOTE — ED Notes (Signed)
Dr. Cathlean Sauer paged and made aware of patient remaining agitated, attempting to climb out of bed and screaming. All PRNs provided to patient. Awaiting a page back at this time.

## 2021-01-02 NOTE — ED Notes (Signed)
Pt sitting up in bed, screaming "Robin Guzman", restless and removing mittens and monitoring equipment. Redirection techniques implemented without success. Pt remains in bed at this time, bed is locked in the lowest position, side rails x2, seizure pads and mittens in place. Room darkened and curtain opened for better visualization.

## 2021-01-02 NOTE — ED Notes (Signed)
Patient transported to CT 

## 2021-01-02 NOTE — ED Notes (Signed)
Unable to obtain a CBG due to patient being agitated and restless.

## 2021-01-02 NOTE — ED Notes (Signed)
Pt is restless in the bed, bed locked in lowest position and pads still in place

## 2021-01-02 NOTE — ED Notes (Addendum)
This RN to bedside to reposition patient at this time. Pt rolled and changed into clean sheets, chuck pad, draw sheet, and brief at this time. Pt became agitated, restless, moaning and attempting to get out of bed. Pt removing cardiac monitor, blood pressure tubing, pulse oximetry and found IV site to be removed.  With assistance from staff, new IV placed, seizure pads placed on bed due to patient placing legs and arms through bed rails, and mittens placed to deter removal of monitoring equipment.  This RN notes that patient is tearful, grabbing at left hip and stating "ow."   Dr. Cathlean Sauer paged and made aware of patient's response and concern for worsening pain.  Awaiting a page back at this time.   1:18 PM Per Dr. Cathlean Sauer, will be to bedside for evaluation. This RN in agreement at this time.   1:45 PM New Orders obtained at this time. Per Dr. Cathlean Sauer, will start

## 2021-01-02 NOTE — Progress Notes (Signed)
SLP Cancellation Note  Patient Details Name: Robin Guzman MRN: [id number] DOB: 1928/05/08   Cancelled treatment:       Reason Eval/Treat Not Completed: Fatigue/lethargy limiting ability to participate;Patient's level of consciousness. SLP will continue efforts for bedside swallowing evaluation when Pt is appropriate,  Thank you, Fryda Molenda H. Roddie Mc, CCC-SLP Speech Language Pathologist    Wende Bushy 12/24/2020, 4:53 PM

## 2021-01-02 NOTE — Progress Notes (Addendum)
PROGRESS NOTE    Robin Guzman  IWP:[id number] DOB: 1928/06/19 DOA: 12/27/2020 PCP: Celene Squibb, MD    Brief Narrative:  Robin Guzman was admitted to the hospital with acute metabolic encephalopathy in the setting of urinary tract infection.  87 year old female past medical history for osteoarthritis, coronary artery disease, diastolic heart failure, type 2 diabetes mellitus, hypertension, dyslipidemia and dementia. Robin Guzman was brought by EMS due to altered mentation.  Apparently around midnight she was noted to be awake and confused, not at her baseline. On her initial physical examination blood pressure 115/59, heart rate 71, temperature 97.9, respiratory rate 10, oxygen saturation 97%. Her lungs were clear to auscultation bilaterally, heart S1-S2, present rhythmic, soft abdomen, no lower extremity edema.  Sodium 137, potassium 5.5, chloride 104, bicarb 21, glucose 137, BUN 36, creatinine 1.86, white count 9.2, hemoglobin 10.5, hematocrit 33.1, platelets 116. SARS COVID-19 pending. Urinalysis specific gravity 1.015, 0-5 red cells, 11-20 blood cells.  Head CT negative for acute changes. Chest film with no infiltrates.   EKG 99 bpm, left axis deviation, left bundle branch block, Qtc 571, sinus with poor R wave progression, no significant ST or T wave changes.   Assessment & Plan:   Principal Problem:   Acute metabolic encephalopathy Active Problems:   Chronic diastolic heart failure (HCC)   Essential hypertension   Controlled type 2 diabetes mellitus without complication, without long-term current use of insulin (HCC)   Hypercholesteremia   CAD (coronary artery disease)   Thrombocytopenia (H[facility])   Altered mental status   Prolonged QT interval   GERD (gastroesophageal reflux disease)   Hypoalbuminemia   Hyperkalemia   Hypophosphatemia   AKI (acute kidney injury) ([place])   Dehydration   1. Acute metabolic encephalopathy in the setting of urinary tract infection. Robin Guzman  has been screaming, very agitated, apparently having pain. She is not able to communicate.   Continue supportive medical care with IV fluids and neuro checks.  Antibiotic therapy with IV ceftriaxone, follow on cell count, cultures and temperature curve.   Corrected qtc I 571, note that she has a left bundle branch block. Avoid antipsychotics for now. Add morphine 2 mg Iv q 2 hrs prn for pain control.   2. AKI with hyperkalemia. Continue hydration with normal saline, Robin Guzman not able to take po due to agitation and cognitive impairment.  Follow up on renal function in am, avoid hypotension and nephrotoxic medications.   3. HTN/ chronic diastolic heart failure. Blood pressure 118/87 mmHg, continue close monitoring.  Continue with carvedilol.   4. T2DM with dyslipidemia. Fasting glucose 137, continue close monitoring, Robin Guzman with poor oral intake due to cognitive impairment.   At home Robin Guzman on atorvastatin.   5. CAD/ prolonged Qtc left bundle branch block.  No apparent chest pain, continue telemetry monitoring. Keep K at 4,0 and Mg at 2,0.  6. Chronic anemia of chronic disease. Stable hgb at 10,5 and Hct at 33.1  7. Depression/ anxiety/ dementia. At home Robin Guzman on Seroquel, divalproex and lorazepam. Resume oral divalproex as tolerated and will resume lorazepam Iv as needed.    Robin Guzman continue to be at high risk for worsening encephalopathy   Status is: Inpatient  Remains inpatient appropriate because:IV treatments appropriate due to intensity of illness or inability to take PO   Dispo: The Robin Guzman is from: SNF              Anticipated d/c is to: SNF  Anticipated d/c date is: 3 days              Robin Guzman currently is not medically stable to d/c.   Difficult to place Robin Guzman No   DVT prophylaxis: Enoxaparin   Code Status:   DNR  Family Communication:  I spoke over the phone with the Robin Guzman's daughter about Robin Guzman's  condition, plan of care, prognosis and all  questions were addressed.    Subjective: Robin Guzman is awake, confused and agitated, screaming. Not following commands or responding to questions.   Objective: Vitals:   12/25/2020 0830 01/10/2021 0900 01/18/2021 0930 12/31/2020 1100  BP: 113/61 120/73 (!) 97/49 118/87  Pulse:  68 64 74  Resp: 12 14 11 16   Temp:  98.1 F (36.7 C)    TempSrc:  Oral    SpO2:  96% 96% 96%  Weight:      Height:        Intake/Output Summary (Last 24 hours) at 12/28/2020 1317 Last data filed at 01/12/2021 0630 Gross per 24 hour  Intake 1000 ml  Output 400 ml  Net 600 ml   Filed Weights   12/27/2020 0037  Weight: 53.4 kg    Examination:   General: Not in pain or dyspnea, deconditioned and ill looking appearing  Neurology: Awake, confused and agitated, not able to communicate due to cognitive impairment.  E ENT: mld pallor, no icterus, oral mucosa moist Cardiovascular: No JVD. S1-S2 present, rhythmic, no gallops, rubs, or murmurs. No lower extremity edema. Pulmonary: positive breath sounds bilaterally, Gastrointestinal. Abdomen soft and non tender Skin. No rashes Musculoskeletal: no joint deformities     Data Reviewed: I have personally reviewed following labs and imaging studies  CBC: Recent Labs  Lab 01/14/2021 0104  WBC 9.2  NEUTROABS 8.3*  HGB 10.5*  HCT 33.1*  MCV 103.4*  PLT 160*   Basic Metabolic Panel: Recent Labs  Lab 01/12/2021 0104  NA 137  K 5.5*  CL 104  CO2 21*  GLUCOSE 137*  BUN 36*  CREATININE 1.86*  CALCIUM 9.0  MG 2.0  PHOS 5.2*   GFR: Estimated Creatinine Clearance: 16.9 mL/min (A) (by C-G formula based on SCr of 1.86 mg/dL (H)). Liver Function Tests: Recent Labs  Lab 12/23/2020 0104  AST 23  ALT 14  ALKPHOS 98  BILITOT 1.4*  PROT 6.2*  ALBUMIN 3.1*   No results for input(s): LIPASE, AMYLASE in the last 168 hours. Recent Labs  Lab 01/13/2021 0104  AMMONIA 16   Coagulation Profile: No results for input(s): INR, PROTIME in the last 168 hours. Cardiac  Enzymes: No results for input(s): CKTOTAL, CKMB, CKMBINDEX, TROPONINI in the last 168 hours. BNP (last 3 results) No results for input(s): PROBNP in the last 8760 hours. HbA1C: No results for input(s): HGBA1C in the last 72 hours. CBG: Recent Labs  Lab 12/28/2020 0111 01/17/2021 0923  GLUCAP 134* 89   Lipid Profile: No results for input(s): CHOL, HDL, LDLCALC, TRIG, CHOLHDL, LDLDIRECT in the last 72 hours. Thyroid Function Tests: No results for input(s): TSH, T4TOTAL, FREET4, T3FREE, THYROIDAB in the last 72 hours. Anemia Panel: No results for input(s): VITAMINB12, FOLATE, FERRITIN, TIBC, IRON, RETICCTPCT in the last 72 hours.    Radiology Studies: I have reviewed all of the imaging during this hospital visit personally     Scheduled Meds: . carvedilol  25 mg Oral BID WC  . heparin  5,000 Units Subcutaneous Q8H  . pantoprazole (PROTONIX) IV  40 mg Intravenous Q24H   Continuous  Infusions: . dextrose 5% lactated ringers 75 mL/hr at 12/28/2020 1106     LOS: 0 days        Magdaline Zollars Gerome Apley, MD

## 2021-01-02 NOTE — ED Notes (Signed)
0.5mg  IV Ativan x1 dose given now per provider order for agitation and restlessness.

## 2021-01-02 NOTE — ED Notes (Signed)
Staff to bedside for repositioning in bed. Staff note that patient hasn't voided since 8am per nurse assistant.  Bladder scan performed and >424mLs noted. Dr. Cathlean Sauer made aware and new orders obtained for indwelling foley at this time.

## 2021-01-03 DIAGNOSIS — G9341 Metabolic encephalopathy: Secondary | ICD-10-CM | POA: Diagnosis not present

## 2021-01-03 DIAGNOSIS — N179 Acute kidney failure, unspecified: Secondary | ICD-10-CM | POA: Diagnosis not present

## 2021-01-03 DIAGNOSIS — E119 Type 2 diabetes mellitus without complications: Secondary | ICD-10-CM | POA: Diagnosis not present

## 2021-01-03 DIAGNOSIS — I2510 Atherosclerotic heart disease of native coronary artery without angina pectoris: Secondary | ICD-10-CM | POA: Diagnosis not present

## 2021-01-03 LAB — GLUCOSE, CAPILLARY
Glucose-Capillary: 108 mg/dL — ABNORMAL HIGH (ref 70–99)
Glucose-Capillary: 124 mg/dL — ABNORMAL HIGH (ref 70–99)
Glucose-Capillary: 126 mg/dL — ABNORMAL HIGH (ref 70–99)
Glucose-Capillary: 127 mg/dL — ABNORMAL HIGH (ref 70–99)

## 2021-01-03 LAB — BASIC METABOLIC PANEL
Anion gap: 9 (ref 5–15)
BUN: 28 mg/dL — ABNORMAL HIGH (ref 8–23)
CO2: 25 mmol/L (ref 22–32)
Calcium: 8.8 mg/dL — ABNORMAL LOW (ref 8.9–10.3)
Chloride: 106 mmol/L (ref 98–111)
Creatinine, Ser: 1.2 mg/dL — ABNORMAL HIGH (ref 0.44–1.00)
GFR, Estimated: 43 mL/min — ABNORMAL LOW (ref >60)
Glucose, Bld: 108 mg/dL — ABNORMAL HIGH (ref 70–99)
Potassium: 4.6 mmol/L (ref 3.5–5.1)
Sodium: 140 mmol/L (ref 135–145)

## 2021-01-03 LAB — CBC WITH DIFFERENTIAL/PLATELET
Abs Immature Granulocytes: 0.02 10*3/uL (ref 0.00–0.07)
Basophils Absolute: 0 10*3/uL (ref 0.0–0.1)
Basophils Relative: 0 %
Eosinophils Absolute: 0.1 10*3/uL (ref 0.0–0.5)
Eosinophils Relative: 1 %
HCT: 29.4 % — ABNORMAL LOW (ref 36.0–46.0)
Hemoglobin: 9.6 g/dL — ABNORMAL LOW (ref 12.0–15.0)
Immature Granulocytes: 0 %
Lymphocytes Relative: 12 %
Lymphs Abs: 0.9 10*3/uL (ref 0.7–4.0)
MCH: 33 pg (ref 26.0–34.0)
MCHC: 32.7 g/dL (ref 30.0–36.0)
MCV: 101 fL — ABNORMAL HIGH (ref 80.0–100.0)
Monocytes Absolute: 0.7 10*3/uL (ref 0.1–1.0)
Monocytes Relative: 9 %
Neutro Abs: 6 10*3/uL (ref 1.7–7.7)
Neutrophils Relative %: 78 %
Platelets: 136 10*3/uL — ABNORMAL LOW (ref 150–400)
RBC: 2.91 MIL/uL — ABNORMAL LOW (ref 3.87–5.11)
RDW: 16.4 % — ABNORMAL HIGH (ref 11.5–15.5)
WBC: 7.7 10*3/uL (ref 4.0–10.5)
nRBC: 0 % (ref 0.0–0.2)

## 2021-01-03 MED ORDER — FENTANYL CITRATE (PF) 100 MCG/2ML IJ SOLN
25.0000 ug | INTRAMUSCULAR | Status: DC | PRN
  Administered 2021-01-03 – 2021-01-04 (×5): 25 ug via INTRAVENOUS
  Filled 2021-01-03 (×5): qty 2

## 2021-01-03 MED ORDER — LORAZEPAM 2 MG/ML IJ SOLN
1.0000 mg | INTRAMUSCULAR | Status: DC | PRN
  Administered 2021-01-03 – 2021-01-04 (×5): 1 mg via INTRAVENOUS
  Filled 2021-01-03 (×5): qty 1

## 2021-01-03 MED ORDER — CHLORHEXIDINE GLUCONATE CLOTH 2 % EX PADS: 6.0000 | MEDICATED_PAD | Freq: Every day | CUTANEOUS | Status: DC

## 2021-01-03 NOTE — Plan of Care (Signed)
  Problem: Pain Managment: Goal: General experience of comfort will improve Outcome: Progressing   Problem: Safety: Goal: Ability to remain free from injury will improve Outcome: Progressing   

## 2021-01-03 NOTE — Progress Notes (Signed)
Patient continue to have severe agitation, has removed her IV line.  She has significant ecchymosis on her left buttock and upper back, likely very painful.      Will change morphine for fentanyl for better pain control.  Patient with significant encephalopathy in the setting of progressive dementia, she has poor prognosis.  Will call her daughter for update.

## 2021-01-03 NOTE — Progress Notes (Signed)
PROGRESS NOTE    Robin Guzman  UYQ:[id number] DOB: 1928-03-04 DOA: 01/07/2021 PCP: Celene Squibb, MD    Brief Narrative:  Robin Guzman was admitted to the hospital with acute metabolic encephalopathy in the setting of urinary tract infection.  87 year old female past medical history for osteoarthritis, coronary artery disease, diastolic heart failure, type 2 diabetes mellitus, hypertension, dyslipidemia and dementia. Patient was brought by EMS due to altered mentation.  Apparently around midnight she was noted to be awake and confused, not at her baseline. On her initial physical examination blood pressure 115/59, heart rate 71, temperature 97.9, respiratory rate 10, oxygen saturation 97%. Her lungs were clear to auscultation bilaterally, heart S1-S2, present rhythmic, soft abdomen, no lower extremity edema.  Sodium 137, potassium 5.5, chloride 104, bicarb 21, glucose 137, BUN 36, creatinine 1.86, white count 9.2, hemoglobin 10.5, hematocrit 33.1, platelets 116. SARS COVID-19 pending. Urinalysis specific gravity 1.015, 0-5 red cells, 11-20 blood cells.  Head CT negative for acute changes. Chest film with no infiltrates.   EKG 99 bpm, left axis deviation, left bundle branch block, Qtc 571, sinus with poor R wave progression, no significant ST or T wave changes.   Patient with urinary retention, placed a foley catheter.  Patient continue to have agitation.   Assessment & Plan:   Principal Problem:   Acute metabolic encephalopathy Active Problems:   Chronic diastolic heart failure (HCC)   Essential hypertension   Controlled type 2 diabetes mellitus without complication, without long-term current use of insulin (HCC)   Hypercholesteremia   UTI (urinary tract infection)   CAD (coronary artery disease)   Moderate protein malnutrition (HCC)   Prolonged QT interval   GERD (gastroesophageal reflux disease)   Hyperkalemia   AKI (acute kidney injury) ([place])   Dehydration   1.  Acute metabolic encephalopathy in the setting of urinary tract infection. She continue to be agitated, mild improvement with lorazepam.  Turns her head to her name and touch, not following commands or adequate verbal response.   Supportive medical therapy with IV fluids, as needed analgesics and anxiolytics. Patient on lorazepam (2 doses yesterday and one this am at 6 am) and morphine IV (one dose yesterday), she is not able to take po. Holding on antipsychotics due to prolonged Qtc.  Continue aspiration and fall precautions.  Antibiotic therapy with ceftriaxone IV WBC is 7,7 this am.   2. AKI with hyperkalemia. Renal function with serum cr at 1,20 with K down to 4,6 and bicarbonate at 28.  Continue hydration with IV fluids (dextrose and balanced electrolyte solutions), patient not able to take po due to encephalopathy.  Follow up on renal function in am.  3. HTN/ chronic diastolic heart failure. Blood pressure has been stable with systolic 638 mmHg. Patient still not able to tolerate po, not getting carvedilol.   4. T2DM with dyslipidemia.  Glucose this am 108, continue with IV dextrose, hold on insulin coverage. Patient still not able to tolerate po.  On atorvastatin (not getting due to encephalopathy)  5. CAD/ prolonged Qtc left bundle branch block.  No chest pain, continue to hold on Qt prolonging medications.  6. Chronic anemia of chronic disease. hgb this am 9,6 and hct at 29,4.  At home on oral iron  7. Depression/ anxiety/ dementia. At home patient on Seroquel, divalproex and lorazepam. Not able to take oral divalproex due to encephalopathy. Continue with as needed lorazepam.   Patient continue to be at high risk for worsening encephalopathy  Status is: Inpatient  Remains inpatient appropriate because:IV treatments appropriate due to intensity of illness or inability to take PO   Dispo: The patient is from: ALF              Anticipated d/c is to: SNF               Anticipated d/c date is: 3 days              Patient currently is not medically stable to d/c.   Difficult to place patient No   DVT prophylaxis: Enoxaparin   Code Status:   DNR   Family Communication:  I spoke with her daughter yesterday     Antimicrobials:   Ceftriaxone     Subjective: Patient continue with agitation, not able to tolerate po, on bilateral mittens,.   Objective: Vitals:   01/19/2021 1800 01/07/2021 1946 01/16/2021 2130 01/14/2021 2237  BP: 121/66 (!) 145/75 (!) 158/90 (!) 165/86  Pulse: 70 75 98 88  Resp: 13 19 17 20   Temp: 98.4 F (36.9 C) 98 F (36.7 C) 99.1 F (37.3 C) (!) 97.5 F (36.4 C)  TempSrc:  Axillary  Oral  SpO2: 100% 100% 100% 97%  Weight:      Height:        Intake/Output Summary (Last 24 hours) at 01/03/2021 0942 Last data filed at 01/03/2021 0600 Gross per 24 hour  Intake 656.35 ml  Output 1175 ml  Net -518.65 ml   Filed Weights   01/12/2021 0037  Weight: 53.4 kg    Examination:   General: deconditioned and ill looking appearing  Neurology: Awake. Moves all 4 extremities, spontaneously, responds with her head to her name and touch, but not following commands and not adequate verbal response, persistent screaming.  E ENT: no pallor, no icterus, oral mucosa moist Cardiovascular: No JVD. S1-S2 present, rhythmic, no gallops, rubs, or murmurs. No lower extremity edema. Pulmonary: positive breath sounds bilaterally,  Gastrointestinal. Abdomen soft and non tender Skin. No rashes Musculoskeletal: no joint deformities     Data Reviewed: I have personally reviewed following labs and imaging studies  CBC: Recent Labs  Lab 12/27/2020 0104 01/03/21 0631  WBC 9.2 7.7  NEUTROABS 8.3* 6.0  HGB 10.5* 9.6*  HCT 33.1* 29.4*  MCV 103.4* 101.0*  PLT 116* 563*   Basic Metabolic Panel: Recent Labs  Lab 01/13/2021 0104 01/03/21 0631  NA 137 140  K 5.5* 4.6  CL 104 106  CO2 21* 25  GLUCOSE 137* 108*  BUN 36* 28*  CREATININE 1.86* 1.20*   CALCIUM 9.0 8.8*  MG 2.0  --   PHOS 5.2*  --    GFR: Estimated Creatinine Clearance: 26.3 mL/min (A) (by C-G formula based on SCr of 1.2 mg/dL (H)). Liver Function Tests: Recent Labs  Lab 01/07/2021 0104  AST 23  ALT 14  ALKPHOS 98  BILITOT 1.4*  PROT 6.2*  ALBUMIN 3.1*   No results for input(s): LIPASE, AMYLASE in the last 168 hours. Recent Labs  Lab 01/18/2021 0104  AMMONIA 16   Coagulation Profile: No results for input(s): INR, PROTIME in the last 168 hours. Cardiac Enzymes: No results for input(s): CKTOTAL, CKMB, CKMBINDEX, TROPONINI in the last 168 hours. BNP (last 3 results) No results for input(s): PROBNP in the last 8760 hours. HbA1C: No results for input(s): HGBA1C in the last 72 hours. CBG: Recent Labs  Lab 12/30/2020 0111 01/05/2021 0923 01/03/21 0644  GLUCAP 134* 89 108*   Lipid Profile: No  results for input(s): CHOL, HDL, LDLCALC, TRIG, CHOLHDL, LDLDIRECT in the last 72 hours. Thyroid Function Tests: No results for input(s): TSH, T4TOTAL, FREET4, T3FREE, THYROIDAB in the last 72 hours. Anemia Panel: No results for input(s): VITAMINB12, FOLATE, FERRITIN, TIBC, IRON, RETICCTPCT in the last 72 hours.    Radiology Studies: I have reviewed all of the imaging during this hospital visit personally     Scheduled Meds: . aspirin EC  81 mg Oral Q breakfast  . atorvastatin  10 mg Oral Daily  . carvedilol  25 mg Oral BID WC  . Chlorhexidine Gluconate Cloth  6 each Topical Daily  . divalproex  125 mg Oral Daily  . divalproex  250 mg Oral BID  . ferrous sulfate  325 mg Oral Daily  . heparin  5,000 Units Subcutaneous Q8H  . pantoprazole (PROTONIX) IV  40 mg Intravenous Q24H   Continuous Infusions: . cefTRIAXone (ROCEPHIN)  IV Stopped (12/27/2020 1535)  . dextrose 5% lactated ringers 75 mL/hr at 01/10/2021 2058     LOS: 1 day        Joselle Deeds Gerome Apley, MD

## 2021-01-04 DIAGNOSIS — N179 Acute kidney failure, unspecified: Secondary | ICD-10-CM | POA: Diagnosis not present

## 2021-01-04 DIAGNOSIS — N390 Urinary tract infection, site not specified: Principal | ICD-10-CM

## 2021-01-04 DIAGNOSIS — I5032 Chronic diastolic (congestive) heart failure: Secondary | ICD-10-CM | POA: Diagnosis not present

## 2021-01-04 DIAGNOSIS — I2510 Atherosclerotic heart disease of native coronary artery without angina pectoris: Secondary | ICD-10-CM | POA: Diagnosis not present

## 2021-01-04 DIAGNOSIS — G9341 Metabolic encephalopathy: Secondary | ICD-10-CM | POA: Diagnosis not present

## 2021-01-04 LAB — BASIC METABOLIC PANEL
Anion gap: 10 (ref 5–15)
BUN: 17 mg/dL (ref 8–23)
CO2: 27 mmol/L (ref 22–32)
Calcium: 9.2 mg/dL (ref 8.9–10.3)
Chloride: 105 mmol/L (ref 98–111)
Creatinine, Ser: 0.96 mg/dL (ref 0.44–1.00)
GFR, Estimated: 56 mL/min — ABNORMAL LOW (ref >60)
Glucose, Bld: 141 mg/dL — ABNORMAL HIGH (ref 70–99)
Potassium: 3.9 mmol/L (ref 3.5–5.1)
Sodium: 142 mmol/L (ref 135–145)

## 2021-01-04 LAB — CBC WITH DIFFERENTIAL/PLATELET
Abs Immature Granulocytes: 0.01 10*3/uL (ref 0.00–0.07)
Basophils Absolute: 0 10*3/uL (ref 0.0–0.1)
Basophils Relative: 0 %
Eosinophils Absolute: 0.1 10*3/uL (ref 0.0–0.5)
Eosinophils Relative: 1 %
HCT: 26.5 % — ABNORMAL LOW (ref 36.0–46.0)
Hemoglobin: 8.4 g/dL — ABNORMAL LOW (ref 12.0–15.0)
Immature Granulocytes: 0 %
Lymphocytes Relative: 22 %
Lymphs Abs: 1 10*3/uL (ref 0.7–4.0)
MCH: 31.8 pg (ref 26.0–34.0)
MCHC: 31.7 g/dL (ref 30.0–36.0)
MCV: 100.4 fL — ABNORMAL HIGH (ref 80.0–100.0)
Monocytes Absolute: 0.7 10*3/uL (ref 0.1–1.0)
Monocytes Relative: 16 %
Neutro Abs: 2.7 10*3/uL (ref 1.7–7.7)
Neutrophils Relative %: 61 %
Platelets: 94 10*3/uL — ABNORMAL LOW (ref 150–400)
RBC: 2.64 MIL/uL — ABNORMAL LOW (ref 3.87–5.11)
RDW: 16.3 % — ABNORMAL HIGH (ref 11.5–15.5)
WBC: 4.5 10*3/uL (ref 4.0–10.5)
nRBC: 0 % (ref 0.0–0.2)

## 2021-01-04 LAB — GLUCOSE, CAPILLARY
Glucose-Capillary: 139 mg/dL — ABNORMAL HIGH (ref 70–99)
Glucose-Capillary: 142 mg/dL — ABNORMAL HIGH (ref 70–99)
Glucose-Capillary: 142 mg/dL — ABNORMAL HIGH (ref 70–99)
Glucose-Capillary: 130 mg/dL — ABNORMAL HIGH (ref 70–99)

## 2021-01-04 MED ORDER — ACETAMINOPHEN 325 MG PO TABS: 650.0000 mg | ORAL_TABLET | Freq: Four times a day (QID) | ORAL | Status: DC | PRN

## 2021-01-04 MED ORDER — GLYCOPYRROLATE 0.2 MG/ML IJ SOLN: 0.2000 mg | INTRAMUSCULAR | Status: DC | PRN

## 2021-01-04 MED ORDER — THIAMINE HCL 100 MG/ML IJ SOLN
Freq: Once | INTRAVENOUS | Status: AC
  Filled 2021-01-04: qty 1000

## 2021-01-04 MED ORDER — LORAZEPAM 1 MG PO TABS: 1.0000 mg | ORAL_TABLET | ORAL | Status: DC | PRN

## 2021-01-04 MED ORDER — POLYVINYL ALCOHOL 1.4 % OP SOLN: 1.0000 [drp] | Freq: Four times a day (QID) | OPHTHALMIC | Status: DC | PRN

## 2021-01-04 MED ORDER — LORAZEPAM 2 MG/ML IJ SOLN
1.0000 mg | INTRAMUSCULAR | Status: DC | PRN
  Administered 2021-01-04: 1 mg via INTRAVENOUS
  Filled 2021-01-04: qty 1

## 2021-01-04 MED ORDER — MORPHINE BOLUS VIA INFUSION
2.0000 mg | INTRAVENOUS | Status: DC | PRN
  Filled 2021-01-04: qty 2

## 2021-01-04 MED ORDER — BIOTENE DRY MOUTH MT LIQD: 15.0000 mL | OROMUCOSAL | Status: DC | PRN

## 2021-01-04 MED ORDER — ACETAMINOPHEN 650 MG RE SUPP
650.0000 mg | Freq: Four times a day (QID) | RECTAL | Status: DC | PRN
  Administered 2021-01-05: 650 mg via RECTAL
  Filled 2021-01-04: qty 1

## 2021-01-04 MED ORDER — GLYCOPYRROLATE 0.2 MG/ML IJ SOLN
0.2000 mg | INTRAMUSCULAR | Status: DC | PRN
  Administered 2021-01-05 – 2021-01-07 (×6): 0.2 mg via INTRAVENOUS
  Filled 2021-01-04 (×6): qty 1

## 2021-01-04 MED ORDER — GLYCOPYRROLATE 1 MG PO TABS: 1.0000 mg | ORAL_TABLET | ORAL | Status: DC | PRN

## 2021-01-04 MED ORDER — LORAZEPAM 2 MG/ML PO CONC: 1.0000 mg | ORAL | Status: DC | PRN

## 2021-01-04 MED ORDER — MORPHINE 100MG IN NS 100ML (1MG/ML) PREMIX INFUSION
5.0000 mg/h | INTRAVENOUS | Status: DC
Start: 2021-01-04 — End: 2021-01-08
  Administered 2021-01-04 – 2021-01-07 (×5): 5 mg/h via INTRAVENOUS
  Filled 2021-01-04 (×5): qty 100

## 2021-01-04 NOTE — Evaluation (Signed)
Clinical/Bedside Swallow Evaluation Patient Details  Name: Robin Guzman MRN: [id number] Date of Birth: 1928-02-13  Today's Date: 01/04/2021 Time: SLP Start Time (ACUTE ONLY): 1035 SLP Stop Time (ACUTE ONLY): 1052 SLP Time Calculation (min) (ACUTE ONLY): 17 min  Past Medical History:  Past Medical History:  Diagnosis Date  . Arthritis   . CAD (coronary artery disease), native coronary artery    07/27/17 nonobstructive, EF 25-35% by LV gram  . CHF (congestive heart failure) ([place])   . Dementia ([place])   . Diabetes mellitus without complication ([place])   . Dyslipidemia   . Dyspnea   . Dyspnea on exertion   . Gallstone 09/2020  . History of stress test 07/05/2006   High risk scan cardiac cathe was recommended.  [name] Hx of echocardiogram 04/09/2011   EF 55% Mildly hypertrophic left ventricle with normal systolic function, Moderate (grade II) diastolic dysfunction. Elevated left atrial pressure. No significant valvular abnormalities. No pericardial effusion. Mild to moderate pulmonary arterial hypertension.  . Hypercholesteremia   . Hypertension   . Myocardial infarct ([place])   . Skin cancer    HISTORY OF    Past Surgical History:  Past Surgical History:  Procedure Laterality Date  . ABDOMINAL HYSTERECTOMY    . CATARACT EXTRACTION W/PHACO Right 05/20/2017   Procedure: CATARACT EXTRACTION PHACO AND INTRAOCULAR LENS PLACEMENT RIGHT EYE;  Surgeon: Tonny Branch, MD;  Location: [facility];  Service: Ophthalmology;  Laterality: Right;  CDE: 17.77  . CATARACT EXTRACTION W/PHACO Left 06/21/2017   Procedure: CATARACT EXTRACTION PHACO AND INTRAOCULAR LENS PLACEMENT (IOC);  Surgeon: Tonny Branch, MD;  Location: [facility];  Service: Ophthalmology;  Laterality: Left;  CDE: 8.90  . ERCP N/A 10/03/2020   Procedure: ENDOSCOPIC RETROGRADE CHOLANGIOPANCREATOGRAPHY (ERCP);  Surgeon: Arta Silence, MD;  Location[facility] ENDOSCOPY;  Service: Endoscopy;  Laterality: N/A;  . HIP PINNING,CANNULATED Left 10/14/2020    Procedure: CANNULATED HIP PINNING;  Surgeon: Altamese Quay, MD;  Location: [facility];  Service: Orthopedics;  Laterality: Left;  . LEFT HEART CATH AND CORONARY ANGIOGRAPHY N/A 07/27/2017   Procedure: LEFT HEART CATH AND CORONARY ANGIOGRAPHY;  Surgeon: Wellington Hampshire, MD;  Location: [facility] CV LAB;  Service: Cardiovascular;  Laterality: N/A;  . REMOVAL OF STONES  10/03/2020   Procedure: REMOVAL OF STONES;  Surgeon: Arta Silence, MD;  Location: [facility] ENDOSCOPY;  Service: Endoscopy;;  . SPHINCTEROTOMY  10/03/2020   Procedure: SPHINCTEROTOMY;  Surgeon: Arta Silence, MD;  Location: [facility] ENDOSCOPY;  Service: Endoscopy;;   HPI:  87 year old female past medical history for osteoarthritis, coronary artery disease, diastolic heart failure, type 2 diabetes mellitus, hypertension, dyslipidemia and dementia. Patient was brought by EMS from ALF due to altered mentation.  Apparently around midnight she was noted to be awake and confused, not at her baseline. On her initial physical examination blood pressure 115/59, heart rate 71, temperature 97.9, respiratory rate 10, oxygen saturation 97%. Her lungs were clear to auscultation bilaterally, heart S1-S2, present rhythmic, soft abdomen, no lower extremity edema. Pt was seen for a clinical swallow evaluation at [facility] in November and was discharged on regular/thin. BSE ordered.   Assessment / Plan / Recommendation Clinical Impression  Pt currently inappropriate for po due to altered mental status and significant agitation. Pt accepted small ice chips and tsp thin with max cues from SLP and presents with reduced labial seal, reduced awareness of bolus, and suspected delay in swallow initiation. Recommend continue NPO with oral care and small sips of water if clinically appropriate. Discussed with RN  and MD. SLP will follow. SLP Visit Diagnosis: Dysphagia, unspecified (R13.10)    Aspiration Risk  Moderate aspiration risk;Risk for inadequate nutrition/hydration     Diet Recommendation Ice chips PRN after oral care;NPO   Medication Administration: Via alternative means    Other  Recommendations Oral Care Recommendations: Oral care prior to ice chip/H20;Staff/trained caregiver to provide oral care   Follow up Recommendations 24 hour supervision/assistance;Skilled Nursing facility      Frequency and Duration min 2x/week  1 week       Prognosis Prognosis for Safe Diet Advancement: Guarded Barriers to Reach Goals: Cognitive deficits;Behavior;Medication      Swallow Study   General Date of Onset: 01/14/2021 HPI: 87 year old female past medical history for osteoarthritis, coronary artery disease, diastolic heart failure, type 2 diabetes mellitus, hypertension, dyslipidemia and dementia. Patient was brought by EMS from ALF due to altered mentation.  Apparently around midnight she was noted to be awake and confused, not at her baseline. On her initial physical examination blood pressure 115/59, heart rate 71, temperature 97.9, respiratory rate 10, oxygen saturation 97%. Her lungs were clear to auscultation bilaterally, heart S1-S2, present rhythmic, soft abdomen, no lower extremity edema. Pt was seen for a clinical swallow evaluation at [facility] in November and was discharged on regular/thin. BSE ordered. Type of Study: Bedside Swallow Evaluation Previous Swallow Assessment: BSE 10/15/20, d/c reg/thin Diet Prior to this Study: NPO Temperature Spikes Noted: No Respiratory Status: Room air History of Recent Intubation: No Behavior/Cognition: Alert;Confused;Agitated Oral Cavity Assessment: Within Functional Limits Oral Care Completed by SLP: Yes Oral Cavity - Dentition: Adequate natural dentition;Missing dentition Vision: Impaired for self-feeding Self-Feeding Abilities: Total assist Patient Positioning: Upright in bed Baseline Vocal Quality: Normal Volitional Cough: Cognitively unable to elicit Volitional Swallow: Unable to elicit    Oral/Motor/Sensory  Function Overall Oral Motor/Sensory Function:  (unable to adequately assess due to AMS)   Ice Chips Ice chips: Impaired Presentation: Spoon Oral Phase Impairments: Reduced labial seal;Reduced lingual movement/coordination;Poor awareness of bolus Oral Phase Functional Implications: Oral holding;Prolonged oral transit Pharyngeal Phase Impairments: Suspected delayed Swallow   Thin Liquid Thin Liquid: Impaired Presentation: Spoon Oral Phase Impairments: Reduced labial seal;Reduced lingual movement/coordination;Poor awareness of bolus Oral Phase Functional Implications: Prolonged oral transit Pharyngeal  Phase Impairments: Suspected delayed Swallow    Nectar Thick Nectar Thick Liquid: Not tested   Honey Thick Honey Thick Liquid: Not tested   Puree Puree: Not tested   Solid     Solid: Not tested     Thank you,  Genene Churn, [place]  PORTER,DABNEY 01/04/2021,11:20 AM

## 2021-01-04 NOTE — Progress Notes (Addendum)
Continues to moan and otherwise nonverbal.  Given ativan and fentanyl as often as ordered prn with no relief noted.   Started on morphine drip and comfort care

## 2021-01-04 NOTE — Progress Notes (Addendum)
PROGRESS NOTE    Robin Guzman  VZC:[id number] DOB: September 02, 1928 DOA: 01/09/2021 PCP: Celene Squibb, MD    Brief Narrative:  Robin Guzman was admitted to the hospital with acute metabolic encephalopathyin the setting of urinary tract infection.  87 year old female past medical history for osteoarthritis, coronary artery disease, diastolic heart failure, type 2 diabetes mellitus, hypertension, dyslipidemia and dementia. Patient was brought by EMS due to altered mentation. Apparently around midnight she was noted to be awake and confused,not at her baseline. On her initial physical examination blood pressure 115/59, heart rate 71, temperature 97.9, respiratory rate 10, oxygen saturation 97%. Her lungs were clear to auscultation bilaterally, heart S1-S2, present rhythmic, soft abdomen, no lower extremity edema.  Sodium 137, potassium 5.5, chloride 104, bicarb 21, glucose 137, BUN 36, creatinine 1.86, white count 9.2, hemoglobin 10.5, hematocrit 33.1, platelets 116. SARS COVID-19 pending. Urinalysis specific gravity 1.015, 0-5 red cells, 11-20 blood cells.  Head CT negative for acute changes. Chest film with no infiltrates.  EKG 99 bpm, left axis deviation, left bundle branch block, Qtc 571, sinus with poor R wave progression, no significant ST or T wave changes.  Patient with urinary retention, placed a foley catheter.  Patient continue to have agitation.   Currently on as needed lorazepam and fentanyl, she is not able to get antipsychotic medications due to prolonged QT.   Assessment & Plan:   Principal Problem:   Acute metabolic encephalopathy Active Problems:   Chronic diastolic heart failure (HCC)   Essential hypertension   Controlled type 2 diabetes mellitus without complication, without long-term current use of insulin (HCC)   Hypercholesteremia   UTI (urinary tract infection)   CAD (coronary artery disease)   Moderate protein malnutrition (HCC)   Prolonged QT  interval   GERD (gastroesophageal reflux disease)   Hyperkalemia   AKI (acute kidney injury) ([place])   Dehydration   1. Acute metabolic encephalopathy with delirium in the setting of urinary tract infection/ baseline with progressive dementia. this am patient is sleeping. Her agitation responded to lorazepam and fentanyl.  Wbc this am at 4.5.   Continue with IV fluids, patient has been NPO due to risk of aspiration, hold on oral medications for now.  Antibiotic therapy with ceftriaxone IV, GI prophylaxis with pantoprazole.  Will add IV vitamins and thiamine, close neuro checks, continue with aspiration and fall precautions. She has a Actuary at the bedside due to risk of fall.   2. AKI with hyperkalemia. Stable renal function with serum cr at 0,96 with K at 3,9 and bicarbonate at 27.   Supportive medical therapy wwith IV fluids, still patient is NPO.  3. HTN/ chronic diastolic heart failure.  Not able to take po medications, her blood pressure has been stable, this am 142/63 mmHg.  No signs of heart failure exacerbation.   4. T2DM with dyslipidemia.  glucose this am is 141, continue close monitoring, patient on IV dextrose infusion.  She is NPO. At home on atorvastatin.   5. CAD/ prolonged Qtc left bundle branch block. avoid Qt prolonging medications.  6. Chronic anemia of chronic disease/ iron deficiency. stable Hgb at 8,4 with Hct at 26.5 At home on oral iron supplementation.   7. Depression/ anxiety/ dementia/ moderate calorie protein malnutrition. At home patient on Seroquel, divalproex and lorazepam. Progressive dementia with poor prognosis,  Continue not able to take po medications, will continue with IV lorazepam. Add IV vitamins.   Patient had a fall last week with extensive bruising and  ecchymosis, likely painful injuries, will continue pain control with IV fentanyl.     Patient continue to be at high risk for worsening encephalopathy   Status is:  Inpatient  Remains inpatient appropriate because:IV treatments appropriate due to intensity of illness or inability to take PO   Dispo: The patient is from: ALF              Anticipated d/c is to: SNF              Anticipated d/c date is: 3 days              Patient currently is not medically stable to d/c.   Difficult to place patient No   DVT prophylaxis: Enoxaparin   Code Status:   DNR   Family Communication:  I spoke with her daughter yesterday      Antimicrobials:   Ceftriaxone     Subjective:  Patient this am sleeping, per sitter she had agitation this am that improved with analgesics and anxiolytics, continue to be NPO due to risk of aspiration   Objective: Vitals:   01/06/2021 2130 01/10/2021 2237 01/03/21 2222 01/04/21 0600  BP: (!) 158/90 (!) 165/86 (!) 150/65 (!) 142/63  Pulse: 98 88 69 77  Resp: 17 20 17 18   Temp: 99.1 F (37.3 C) (!) 97.5 F (36.4 C) 97.7 F (36.5 C) 98.2 F (36.8 C)  TempSrc:  Oral Oral Oral  SpO2: 100% 97% 100% 96%  Weight:      Height:        Intake/Output Summary (Last 24 hours) at 01/04/2021 0912 Last data filed at 01/04/2021 0809 Gross per 24 hour  Intake 900 ml  Output 1050 ml  Net -150 ml   Filed Weights   01/16/2021 0037  Weight: 53.4 kg    Examination:   General: Not in pain or dyspnea  Neurology: sleeping this am, she has been moving all 4 extremities, spontaneously   E ENT: no pallor, no icterus, oral mucosa moist Cardiovascular: No JVD. S1-S2 present, rhythmic, no gallops, rubs, or murmurs. No lower extremity edema. Pulmonary: positive breath sounds bilaterally, Gastrointestinal. Abdomen soft and non tender Skin. Multiple ecchymosis left buttock, upper back and neck.  Musculoskeletal: no joint deformities     Data Reviewed: I have personally reviewed following labs and imaging studies  CBC: Recent Labs  Lab 12/24/2020 0104 01/03/21 0631 01/04/21 0614  WBC 9.2 7.7 4.5  NEUTROABS 8.3* 6.0 2.7  HGB 10.5*  9.6* 8.4*  HCT 33.1* 29.4* 26.5*  MCV 103.4* 101.0* 100.4*  PLT 116* 136* 94*   Basic Metabolic Panel: Recent Labs  Lab 12/25/2020 0104 01/03/21 0631 01/04/21 0614  NA 137 140 142  K 5.5* 4.6 3.9  CL 104 106 105  CO2 21* 25 27  GLUCOSE 137* 108* 141*  BUN 36* 28* 17  CREATININE 1.86* 1.20* 0.96  CALCIUM 9.0 8.8* 9.2  MG 2.0  --   --   PHOS 5.2*  --   --    GFR: Estimated Creatinine Clearance: 32.8 mL/min (by C-G formula based on SCr of 0.96 mg/dL). Liver Function Tests: Recent Labs  Lab 12/23/2020 0104  AST 23  ALT 14  ALKPHOS 98  BILITOT 1.4*  PROT 6.2*  ALBUMIN 3.1*   No results for input(s): LIPASE, AMYLASE in the last 168 hours. Recent Labs  Lab 01/07/2021 0104  AMMONIA 16   Coagulation Profile: No results for input(s): INR, PROTIME in the last 168 hours. Cardiac Enzymes: No  results for input(s): CKTOTAL, CKMB, CKMBINDEX, TROPONINI in the last 168 hours. BNP (last 3 results) No results for input(s): PROBNP in the last 8760 hours. HbA1C: No results for input(s): HGBA1C in the last 72 hours. CBG: Recent Labs  Lab 01/03/21 1226 01/03/21 2213 01/03/21 2349 01/04/21 0403 01/04/21 0757  GLUCAP 126* 127* 124* 139* 142*   Lipid Profile: No results for input(s): CHOL, HDL, LDLCALC, TRIG, CHOLHDL, LDLDIRECT in the last 72 hours. Thyroid Function Tests: No results for input(s): TSH, T4TOTAL, FREET4, T3FREE, THYROIDAB in the last 72 hours. Anemia Panel: No results for input(s): VITAMINB12, FOLATE, FERRITIN, TIBC, IRON, RETICCTPCT in the last 72 hours.    Radiology Studies: I have reviewed all of the imaging during this hospital visit personally     Scheduled Meds: . aspirin EC  81 mg Oral Q breakfast  . atorvastatin  10 mg Oral Daily  . carvedilol  25 mg Oral BID WC  . Chlorhexidine Gluconate Cloth  6 each Topical Daily  . divalproex  125 mg Oral Daily  . divalproex  250 mg Oral BID  . ferrous sulfate  325 mg Oral Daily  . heparin  5,000 Units  Subcutaneous Q8H  . pantoprazole (PROTONIX) IV  40 mg Intravenous Q24H   Continuous Infusions: . cefTRIAXone (ROCEPHIN)  IV 1 g (01/03/21 1427)  . dextrose 5% lactated ringers 75 mL/hr at 01/04/21 0325     LOS: 2 days        Joal Eakle Gerome Apley, MD

## 2021-01-04 NOTE — Progress Notes (Signed)
Unfortunately Robin Guzman has not responded to aggressive medical therapy with intravenous fluids and IV antibiotic therapy.  Despite intravenous lorazepam and fentanyl, she continued to show significant pain and discomfort. She is not able to communicate or eat by mouth.  Her prognosis is very poor.  I spoke with her daughter over the phone Robin Guzman, and explained her the situation.  Apparently Robin Guzman health has been declining fairly rapidly as a consequence of her dementia.  Considering her poor prognosis, decision was made to transition patient to comfort care in order to avoid any further suffering. Patient will placed on a morphine drip, along with palliative care orders protocol.  All questions were addressed.

## 2021-01-04 NOTE — Progress Notes (Signed)
Bolused 2 mg

## 2021-01-05 DIAGNOSIS — G9341 Metabolic encephalopathy: Secondary | ICD-10-CM | POA: Diagnosis not present

## 2021-01-05 DIAGNOSIS — I5032 Chronic diastolic (congestive) heart failure: Secondary | ICD-10-CM | POA: Diagnosis not present

## 2021-01-05 DIAGNOSIS — I2510 Atherosclerotic heart disease of native coronary artery without angina pectoris: Secondary | ICD-10-CM | POA: Diagnosis not present

## 2021-01-05 DIAGNOSIS — N179 Acute kidney failure, unspecified: Secondary | ICD-10-CM | POA: Diagnosis not present

## 2021-01-05 LAB — GLUCOSE, CAPILLARY
Glucose-Capillary: 111 mg/dL — ABNORMAL HIGH (ref 70–99)
Glucose-Capillary: 103 mg/dL — ABNORMAL HIGH (ref 70–99)
Glucose-Capillary: 112 mg/dL — ABNORMAL HIGH (ref 70–99)

## 2021-01-05 NOTE — Progress Notes (Signed)
Pt resting, not agitated, safety mitts in place due to pulling of cords on prev shift, will reassess need. Bed in low position, fall mats in place, pt repositioned.

## 2021-01-05 NOTE — Progress Notes (Signed)
Nutrition Brief Note  Chart reviewed. Pt now transitioning to comfort care.  No further nutrition interventions warranted at this time.  Please re-consult as needed.   Keisean Skowron W, RD, LDN, CDCES Registered Dietitian II Certified Diabetes Care and Education Specialist Please refer to [facility] for RD and/or RD on-call/weekend/after hours pager  

## 2021-01-05 NOTE — Progress Notes (Addendum)
PROGRESS NOTE    Robin Guzman  INO:[id number] DOB: 02-Jun-1928 DOA: 01/15/2021 PCP: Celene Squibb, MD    Brief Narrative:  Robin Guzman was admitted to the hospital with acute metabolic encephalopathyin the setting of urinary tract infection. Patent with severe delirium in the setting of progressive dementia. Transition to comfort care due to poor prognosis.   87 year old female past medical history for osteoarthritis, coronary artery disease, diastolic heart failure, type 2 diabetes mellitus, hypertension, dyslipidemia and dementia. Patient was brought by EMS due to altered mentation. Apparently around midnight she was noted to be awake and confused,not at her baseline. On her initial physical examination blood pressure 115/59, heart rate 71, temperature 97.9, respiratory rate 10, oxygen saturation 97%. Her lungs were clear to auscultation bilaterally, heart S1-S2, present rhythmic, soft abdomen, no lower extremity edema.  Sodium 137, potassium 5.5, chloride 104, bicarb 21, glucose 137, BUN 36, creatinine 1.86, white count 9.2, hemoglobin 10.5, hematocrit 33.1, platelets 116. SARS COVID-19 pending. Urinalysis specific gravity 1.015, 0-5 red cells, 11-20 blood cells.  Head CT negative for acute changes. Chest film with no infiltrates.  EKG 99 bpm, left axis deviation, left bundle branch block, Qtc 571, sinus with poor R wave progression, no significant ST or T wave changes.  Patient with urinary retention, placed a foley catheter.  Patient continue to have agitation.  Continue to have severe agitation despite as needed lorazepam and fentanyl, she is not able to get antipsychotic medications due to prolonged QT.   Patient with poor prognosis, severe encephalopathy with delirium in the setting of progressive dementia.  I spoke with her daughter and patient was placed on comfort care.   Assessment & Plan:   Principal Problem:   Acute metabolic encephalopathy Active  Problems:   Chronic diastolic heart failure (HCC)   Essential hypertension   Controlled type 2 diabetes mellitus without complication, without long-term current use of insulin (HCC)   Hypercholesteremia   UTI (urinary tract infection)   CAD (coronary artery disease)   Moderate protein malnutrition (HCC)   Prolonged QT interval   GERD (gastroesophageal reflux disease)   Hyperkalemia   AKI (acute kidney injury) ([place])   Dehydration  Sepsis ruled out   1. Acute metabolic encephalopathy with delirium in the setting of urinary tract infection (present on admission). Patient with progressive dementia. Yesterday patient with severe and constant pain plus agitation. Not tolerating po and not able to communicate.  Transitioned to comfort care to prevent any further suffering, her prognosis is very poor considering her progressive dementia. Further antibiotic therapy, IV fluids and antibiotic therapy has been discontinue to concentrate in comfort care.   Now on morphine drip at 5 mg/ per hr with adequate pain and agitation control.   2. AKI with hyperkalemia.Patient not able to eat due to cognitive impairment, she is NPO. No further blood work.   3. HTN/ chronic diastolic heart failure. Continue comfort care  4. T2DM with dyslipidemia.continue with comfort care  5. CAD/ prolonged Qtc left bundle branch block. off telemetry   6. Chronic anemia of chronic disease/ iron deficiency. No further blood work.   7. Depression/ anxiety/ dementia/ moderate calorie protein malnutrition. At home patient on Seroquel, divalproex and lorazepam. Progressive dementia with poor prognosis,  Patient had a fall last week with extensive bruising and ecchymosis, likely painful injuries, will continue pain control with IV fentanyl.   As needed lorazepam per comfort measures protocol.    Status is: Inpatient  Remains inpatient appropriate because:IV treatments  appropriate due to intensity of  illness or inability to take PO   Dispo: The patient is from: Home              Anticipated d/c is to: Home              Anticipated d/c date is: 1 day              Patient currently is not medically stable to d/c. Patient on comfort care death is imminent.    Difficult to place patient No    Subjective: Patient is sleeping at the time of my examination, no further agitation.   Objective: Vitals:   01/04/21 0600 01/04/21 1536 01/04/21 2057 01/05/21 0557  BP: (!) 142/63 (!) 158/110 135/82 (!) 81/39  Pulse: 77 95 83 80  Resp: 18 16 16 14   Temp: 98.2 F (36.8 C) 99.4 F (37.4 C) 97.7 F (36.5 C)   TempSrc: Oral Axillary Axillary   SpO2: 96% 91% 96% (!) 88%  Weight:      Height:        Intake/Output Summary (Last 24 hours) at 01/05/2021 1215 Last data filed at 01/05/2021 0900 Gross per 24 hour  Intake 843.22 ml  Output --  Net 843.22 ml   Filed Weights   12/26/2020 0037  Weight: 53.4 kg    Examination:   General: Not in pain or dyspnea  Neurology: sedated  E ENT: mild pallor,  Cardiovascular: No JVD. S1-S2 present, rhythmic, no gallops, rubs, or murmurs. No lower extremity edema. Pulmonary: positive breath sounds bilaterally, positive rhonchi.  Gastrointestinal. Abdomen soft  Skin. Multiple ecchymosis  Musculoskeletal: no joint deformities     Data Reviewed: I have personally reviewed following labs and imaging studies  CBC: Recent Labs  Lab 01/01/2021 0104 01/03/21 0631 01/04/21 0614  WBC 9.2 7.7 4.5  NEUTROABS 8.3* 6.0 2.7  HGB 10.5* 9.6* 8.4*  HCT 33.1* 29.4* 26.5*  MCV 103.4* 101.0* 100.4*  PLT 116* 136* 94*   Basic Metabolic Panel: Recent Labs  Lab 01/05/2021 0104 01/03/21 0631 01/04/21 0614  NA 137 140 142  K 5.5* 4.6 3.9  CL 104 106 105  CO2 21* 25 27  GLUCOSE 137* 108* 141*  BUN 36* 28* 17  CREATININE 1.86* 1.20* 0.96  CALCIUM 9.0 8.8* 9.2  MG 2.0  --   --   PHOS 5.2*  --   --    GFR: Estimated Creatinine Clearance: 32.8 mL/min (by  C-G formula based on SCr of 0.96 mg/dL). Liver Function Tests: Recent Labs  Lab 01/12/2021 0104  AST 23  ALT 14  ALKPHOS 98  BILITOT 1.4*  PROT 6.2*  ALBUMIN 3.1*   No results for input(s): LIPASE, AMYLASE in the last 168 hours. Recent Labs  Lab 12/30/2020 0104  AMMONIA 16   Coagulation Profile: No results for input(s): INR, PROTIME in the last 168 hours. Cardiac Enzymes: No results for input(s): CKTOTAL, CKMB, CKMBINDEX, TROPONINI in the last 168 hours. BNP (last 3 results) No results for input(s): PROBNP in the last 8760 hours. HbA1C: No results for input(s): HGBA1C in the last 72 hours. CBG: Recent Labs  Lab 01/04/21 0403 01/04/21 0757 01/04/21 1133 01/04/21 1621 01/05/21 0738  GLUCAP 139* 142* 142* 130* 111*   Lipid Profile: No results for input(s): CHOL, HDL, LDLCALC, TRIG, CHOLHDL, LDLDIRECT in the last 72 hours. Thyroid Function Tests: No results for input(s): TSH, T4TOTAL, FREET4, T3FREE, THYROIDAB in the last 72 hours. Anemia Panel: No results for input(s):  VITAMINB12, FOLATE, FERRITIN, TIBC, IRON, RETICCTPCT in the last 72 hours.    Radiology Studies: I have reviewed all of the imaging during this hospital visit personally     Scheduled Meds: Continuous Infusions: . morphine 5 mg/hr (01/05/21 0653)     LOS: 3 days        Jayquan Bradsher Gerome Apley, MD

## 2021-01-05 NOTE — Progress Notes (Signed)
No moaning, resp 20 on morphine drip 5 mg. Appears to be in no distress, sleeping

## 2021-01-05 NOTE — Progress Notes (Signed)
SLP Cancellation Note  Patient Details Name: Robin Guzman MRN: [id number] DOB: June 26, 1928   Cancelled treatment:       Reason Eval/Treat Not Completed: Other (comment);Patient's level of consciousness (Pt is now transitioning to comfort care)  Thank you,  Genene Churn, [place] 01/05/2021, 11:35 AM

## 2021-01-05 NOTE — Progress Notes (Signed)
   01/05/21 1542  Assess: MEWS Score  Temp 99.8 F (37.7 C)  BP (!) 59/28  Pulse Rate 87  Resp 20  SpO2 (!) 83 %  O2 Device Room Air  Assess: MEWS Score  MEWS Temp 0  MEWS Systolic 3  MEWS Pulse 0  MEWS RR 0  MEWS LOC 0  MEWS Score 3  MEWS Score Color Yellow  Treat  MEWS Interventions Other (Comment) (comfort care patient)  Document  Progress note created (see row info) Yes

## 2021-01-06 DIAGNOSIS — E119 Type 2 diabetes mellitus without complications: Secondary | ICD-10-CM | POA: Diagnosis not present

## 2021-01-06 DIAGNOSIS — G9341 Metabolic encephalopathy: Secondary | ICD-10-CM | POA: Diagnosis not present

## 2021-01-06 DIAGNOSIS — N179 Acute kidney failure, unspecified: Secondary | ICD-10-CM | POA: Diagnosis not present

## 2021-01-06 DIAGNOSIS — I2510 Atherosclerotic heart disease of native coronary artery without angina pectoris: Secondary | ICD-10-CM | POA: Diagnosis not present

## 2021-01-06 NOTE — Progress Notes (Signed)
PROGRESS NOTE    Robin Guzman  TZG:[id number] DOB: 03-14-1928 DOA: 01/04/2021 PCP: Celene Squibb, MD    Brief Narrative:  Robin Guzman was admitted to the hospital with acute metabolic encephalopathyin the setting of urinary tract infection. Patent with severe delirium in the setting of progressive dementia. Transition to comfort care due to poor prognosis.   87 year old female past medical history for osteoarthritis, coronary artery disease, diastolic heart failure, type 2 diabetes mellitus, hypertension, dyslipidemia and dementia. Patient was brought by EMS due to altered mentation. Apparently around midnight she was noted to be awake and confused,not at her baseline. On her initial physical examination blood pressure 115/59, heart rate 71, temperature 97.9, respiratory rate 10, oxygen saturation 97%. Her lungs were clear to auscultation bilaterally, heart S1-S2, present rhythmic, soft abdomen, no lower extremity edema.  Sodium 137, potassium 5.5, chloride 104, bicarb 21, glucose 137, BUN 36, creatinine 1.86, white count 9.2, hemoglobin 10.5, hematocrit 33.1, platelets 116. SARS COVID-19 pending. Urinalysis specific gravity 1.015, 0-5 red cells, 11-20 blood cells.  Head CT negative for acute changes. Chest film with no infiltrates.  EKG 99 bpm, left axis deviation, left bundle branch block, Qtc 571, sinus with poor R wave progression, no significant ST or T wave changes.  Patient with urinary retention, placed a foley catheter.  Patient continue to have agitation.  Continue to have severe agitation despite as needed lorazepam and fentanyl, she is not able to get antipsychotic medications due to prolonged QT.  Patient with poor prognosis, severe encephalopathy with delirium in the setting of progressive dementia.  I spoke with her daughter and patient was placed on comfort care.    Assessment & Plan:   Principal Problem:   Acute metabolic  encephalopathy Active Problems:   Chronic diastolic heart failure (HCC)   Essential hypertension   Controlled type 2 diabetes mellitus without complication, without long-term current use of insulin (HCC)   Hypercholesteremia   UTI (urinary tract infection)   CAD (coronary artery disease)   Moderate protein malnutrition (HCC)   Prolonged QT interval   GERD (gastroesophageal reflux disease)   Hyperkalemia   AKI (acute kidney injury) ([place])   Dehydration   Sepsis ruled out   1. Acute metabolic encephalopathywith deliriumin the setting of urinary tract infection (present on admission). Patient with progressive dementia. Now on  comfort care to prevent any further suffering, her prognosis continue to be very poor considering her progressive dementia. Continue with morphine drip at 5 mg/ per hr with adequate pain and agitation control.  As needed lorazepam.   Imminent death, patient is hypotensive and hypoxic.   2. AKI with hyperkalemia.Patient NPO.  3. HTN/ chronic diastolic heart failure.On comfort care  4. T2DM with dyslipidemia.On comfort care  5. CAD/ prolonged Qtc left bundle branch block. off telemetry   6. Chronic anemia of chronic disease/ iron deficiency. No further blood work.   7. Depression/ anxiety/ dementia/ moderate calorie protein malnutrition. At home patient on Seroquel, divalproex and lorazepam. Progressive dementia with poor prognosis,  Patient had a fall last week with extensive bruising and ecchymosis, likely painful injuries, will continue pain control with IV fentanyl.  As needed lorazepam per comfort measures protocol   Status is: Inpatient  Remains inpatient appropriate because:Unsafe d/c plan and IV treatments appropriate due to intensity of illness or inability to take PO   Dispo: The patient is from: Home              Anticipated d/c is to:  Home              Anticipated d/c date is: 1 day              Patient currently is not  medically stable to d/c.   Difficult to place patient No   DVT prophylaxis: Na   Code Status:    dnr   Family Communication:   I spoke over the phone with the patient's daughter about patient's  condition, plan of care, prognosis and all questions were addressed.   Subjective: Patient comfortable, no signs of distress, on continuous infusion or morphine.   Objective: Vitals:   01/05/21 0557 01/05/21 1542 01/05/21 2135 01/06/21 0034  BP: (!) 81/39 (!) 59/28 (!) 53/42   Pulse: 80 87 84   Resp: 14 20 12  (!) 9  Temp:  99.8 F (37.7 C) (!) 101.6 F (38.7 C)   TempSrc:  Oral Oral   SpO2: (!) 88% (!) 83% (!) 82%   Weight:      Height:        Intake/Output Summary (Last 24 hours) at 01/06/2021 1134 Last data filed at 01/06/2021 0600 Gross per 24 hour  Intake 110.76 ml  Output 650 ml  Net -539.24 ml   Filed Weights   01/15/2021 0037  Weight: 53.4 kg    Examination:   General: deconditioned  Neurology: sedated E ENT: dry mucous membranes  Cardiovascular: No JVD. S1-S2 present, rhythmic, . No lower extremity edema. Pulmonary: scattered rhonchi. Gastrointestinal. Abdomen soft and non tender Skin. No rashes Musculoskeletal: no joint deformities     Data Reviewed: I have personally reviewed following labs and imaging studies  CBC: Recent Labs  Lab 01/04/2021 0104 01/03/21 0631 01/04/21 0614  WBC 9.2 7.7 4.5  NEUTROABS 8.3* 6.0 2.7  HGB 10.5* 9.6* 8.4*  HCT 33.1* 29.4* 26.5*  MCV 103.4* 101.0* 100.4*  PLT 116* 136* 94*   Basic Metabolic Panel: Recent Labs  Lab 01/09/2021 0104 01/03/21 0631 01/04/21 0614  NA 137 140 142  K 5.5* 4.6 3.9  CL 104 106 105  CO2 21* 25 27  GLUCOSE 137* 108* 141*  BUN 36* 28* 17  CREATININE 1.86* 1.20* 0.96  CALCIUM 9.0 8.8* 9.2  MG 2.0  --   --   PHOS 5.2*  --   --    GFR: Estimated Creatinine Clearance: 32.8 mL/min (by C-G formula based on SCr of 0.96 mg/dL). Liver Function Tests: Recent Labs  Lab 01/09/2021 0104  AST 23   ALT 14  ALKPHOS 98  BILITOT 1.4*  PROT 6.2*  ALBUMIN 3.1*   No results for input(s): LIPASE, AMYLASE in the last 168 hours. Recent Labs  Lab 12/29/2020 0104  AMMONIA 16   Coagulation Profile: No results for input(s): INR, PROTIME in the last 168 hours. Cardiac Enzymes: No results for input(s): CKTOTAL, CKMB, CKMBINDEX, TROPONINI in the last 168 hours. BNP (last 3 results) No results for input(s): PROBNP in the last 8760 hours. HbA1C: No results for input(s): HGBA1C in the last 72 hours. CBG: Recent Labs  Lab 01/04/21 1133 01/04/21 1621 01/05/21 0738 01/05/21 1648 01/05/21 2047  GLUCAP 142* 130* 111* 103* 112*   Lipid Profile: No results for input(s): CHOL, HDL, LDLCALC, TRIG, CHOLHDL, LDLDIRECT in the last 72 hours. Thyroid Function Tests: No results for input(s): TSH, T4TOTAL, FREET4, T3FREE, THYROIDAB in the last 72 hours. Anemia Panel: No results for input(s): VITAMINB12, FOLATE, FERRITIN, TIBC, IRON, RETICCTPCT in the last 72 hours.    Radiology  Studies: I have reviewed all of the imaging during this hospital visit personally     Scheduled Meds: Continuous Infusions: . morphine 5 mg/hr (01/06/21 0352)     LOS: 4 days        Giannie Soliday Gerome Apley, MD

## 2021-01-07 DIAGNOSIS — G9341 Metabolic encephalopathy: Secondary | ICD-10-CM | POA: Diagnosis not present

## 2021-01-07 NOTE — Progress Notes (Signed)
PROGRESS NOTE    [name]  MHD:[id number] DOB: 01-28-1928 DOA: 12/26/2020 PCP: Celene Squibb, MD    Brief Narrative:  Robin Guzman is a 87-year-old female with past medical history significant for osteoarthritis, CAD, chronic diastolic congestive heart failure, type 2 diabetes mellitus, essential hypertension, dyslipidemia and dementia who was brought to the ED via EMS for confusion.  Patient was noted by family to be altered around midnight and was confused beyond her normal baseline.  In the ED, BP 115/59, temperature 97.9 F, RR 10, HR 71, SPO2 97% on room air.  Sodium 137, potassium 5.5, chloride 104, bicarb 21, glucose 137, BUN 36, creatinine 1.86, WBC 9.2, hemoglobin 10.5, platelets 116.  CT head with no acute intracranial abnormality.  Chest x-ray with no acute cardiopulmonary disease process.  Patient was noted to have acute urinary retention and Foley catheter was placed.  Hospital service consulted for further evaluation and management of acute metabolic encephalopathy with underlying dementia.   Assessment & Plan:   Principal Problem:   Acute metabolic encephalopathy Active Problems:   Chronic diastolic heart failure (HCC)   Essential hypertension   Controlled type 2 diabetes mellitus without complication, without long-term current use of insulin (HCC)   Hypercholesteremia   UTI (urinary tract infection)   CAD (coronary artery disease)   Moderate protein malnutrition (HCC)   Prolonged QT interval   GERD (gastroesophageal reflux disease)   Hyperkalemia   AKI (acute kidney injury) ([place])   Dehydration   Acute metabolic encephalopathy with delirium, POA Urinary tract infection, POA Advanced dementia Patient presenting to the ED with confusion.  CT head without contrast and chest x-ray unrevealing.  Thought initially secondary to urinary tract infection in which was treated with 3-day course of ceftriaxone.  Likely complicated by her advanced dementia.  No  significant improvement in her mental status; and patient now has been transitioned to comfort care. --Morphine drip --Ativan as needed for anxiety --Glycopyrrolate as needed excessive secretions  AKI with hyperkalemia Creatinine 1.86 on admission, likely multifactorial given obstructive uropathy, infection with UTI and poor oral intake. --Cr 1.86>1.20>0.96 --now transitioned to comfort measures as above  Essential hypertension Chronic diastolic congestive heart failure Discontinued home aspirin, statin, carvedilol has now transition to comfort measures.  Discontinue telemetry.  Type 2 diabetes mellitus with dyslipidemia Discontinued home atorvastatin, Metformin and insulin has now transition to comfort measures.  No further CBG checks.  CAD Prolonged QTC Left bundle branch block --Now off telemetry as of care transition to comfort measures  Chronic anemia of chronic kidney disease Iron deficiency anemia Discontinued home ferrous sulfate now transition to comfort care.  Depression/anxiety Moderate protein calorie malnutrition Advanced dementia Body mass index is 20.21 kg/m.  Home medications include Seroquel, divalproex, and lorazepam. --Discontinue home medications now on comfort measures --Morphine drip --Ativan IV as needed   DVT prophylaxis: None, comfort measures   Code Status: DNR Family Communication: No family present at bedside this morning  Disposition Plan:  Level of care: Med-Surg Status is: Inpatient  Remains inpatient appropriate because:IV treatments appropriate due to intensity of illness or inability to take PO and Inpatient level of care appropriate due to severity of illness   Dispo: The patient is from: Home              Anticipated d/c is to: Residential hospice              Anticipated d/c date is: 1 day  Patient currently is medically stable to d/c.   Difficult to place patient No   Consultants:   None  Procedures:    None  Antimicrobials:   Ceftriaxone 2/11 - 2/13   Subjective: Patient seen and examined at bedside, resting comfortably.  Obtunded.  On morphine drip.  No family present.  No acute concerns overnight per nursing staff.  Objective: Vitals:   01/05/21 1542 01/05/21 2135 01/06/21 0034 01/06/21 2045  BP: (!) 59/28 (!) 53/42  (!) 91/51  Pulse: 87 84  86  Resp: 20 12 (!) 9 (!) 8  Temp: 99.8 F (37.7 C) (!) 101.6 F (38.7 C)  98.2 F (36.8 C)  TempSrc: Oral Oral    SpO2: (!) 83% (!) 82%  92%  Weight:      Height:        Intake/Output Summary (Last 24 hours) at 01/07/2021 1350 Last data filed at 01/07/2021 1300 Gross per 24 hour  Intake 0 ml  Output 100 ml  Net -100 ml   Filed Weights   01/01/2021 0037  Weight: 53.4 kg    Examination:  General exam: Appears calm and comfortable, obtunded Respiratory system: Clear to auscultation. Respiratory effort normal.  On room air Cardiovascular system: S1 & S2 heard, RRR. No JVD, murmurs, rubs, gallops or clicks. No pedal edema. Gastrointestinal system: Abdomen is nondistended, soft and nontender. Central nervous system: Obtunded Extremities: Will move all extremities independently but not to command Skin: No rashes, lesions or ulcers Psychiatry: Unable to assess secondary to patient's current mental status    Data Reviewed: I have personally reviewed following labs and imaging studies  CBC: Recent Labs  Lab 12/31/2020 0104 01/03/21 0631 01/04/21 0614  WBC 9.2 7.7 4.5  NEUTROABS 8.3* 6.0 2.7  HGB 10.5* 9.6* 8.4*  HCT 33.1* 29.4* 26.5*  MCV 103.4* 101.0* 100.4*  PLT 116* 136* 94*   Basic Metabolic Panel: Recent Labs  Lab 01/01/2021 0104 01/03/21 0631 01/04/21 0614  NA 137 140 142  K 5.5* 4.6 3.9  CL 104 106 105  CO2 21* 25 27  GLUCOSE 137* 108* 141*  BUN 36* 28* 17  CREATININE 1.86* 1.20* 0.96  CALCIUM 9.0 8.8* 9.2  MG 2.0  --   --   PHOS 5.2*  --   --    GFR: Estimated Creatinine Clearance: 32.8 mL/min (by  C-G formula based on SCr of 0.96 mg/dL). Liver Function Tests: Recent Labs  Lab 01/06/2021 0104  AST 23  ALT 14  ALKPHOS 98  BILITOT 1.4*  PROT 6.2*  ALBUMIN 3.1*   No results for input(s): LIPASE, AMYLASE in the last 168 hours. Recent Labs  Lab 01/10/2021 0104  AMMONIA 16   Coagulation Profile: No results for input(s): INR, PROTIME in the last 168 hours. Cardiac Enzymes: No results for input(s): CKTOTAL, CKMB, CKMBINDEX, TROPONINI in the last 168 hours. BNP (last 3 results) No results for input(s): PROBNP in the last 8760 hours. HbA1C: No results for input(s): HGBA1C in the last 72 hours. CBG: Recent Labs  Lab 01/04/21 1133 01/04/21 1621 01/05/21 0738 01/05/21 1648 01/05/21 2047  GLUCAP 142* 130* 111* 103* 112*   Lipid Profile: No results for input(s): CHOL, HDL, LDLCALC, TRIG, CHOLHDL, LDLDIRECT in the last 72 hours. Thyroid Function Tests: No results for input(s): TSH, T4TOTAL, FREET4, T3FREE, THYROIDAB in the last 72 hours. Anemia Panel: No results for input(s): VITAMINB12, FOLATE, FERRITIN, TIBC, IRON, RETICCTPCT in the last 72 hours. Sepsis Labs: No results for input(s): PROCALCITON, LATICACIDVEN in  the last 168 hours.  Recent Results (from the past 240 hour(s))  SARS CORONAVIRUS 2 (TAT 6-24 HRS) Nasopharyngeal Nasopharyngeal Swab     Status: None   Collection Time: 01/03/2021  2:13 AM   Specimen: Nasopharyngeal Swab  Result Value Ref Range Status   SARS Coronavirus 2 NEGATIVE NEGATIVE Final    Comment: (NOTE) SARS-CoV-2 target nucleic acids are NOT DETECTED.  The SARS-CoV-2 RNA is generally detectable in upper and lower respiratory specimens during the acute phase of infection. Negative results do not preclude SARS-CoV-2 infection, do not rule out co-infections with other pathogens, and should not be used as the sole basis for treatment or other patient management decisions. Negative results must be combined with clinical observations, patient history, and  epidemiological information. The expected result is Negative.  Fact Sheet for Patients: SugarRoll.be  Fact Sheet for Healthcare Providers: https://www.woods-mathews.com/  This test is not yet approved or cleared by the [place] FDA and  has been authorized for detection and/or diagnosis of SARS-CoV-2 by FDA under an Emergency Use Authorization (EUA). This EUA will remain  in effect (meaning this test can be used) for the duration of the COVID-19 declaration under Se ction 564(b)(1) of the Act, 21 U.S.C. section 360bbb-3(b)(1), unless the authorization is terminated or revoked sooner.  Performed at [facility], [place] [address] [place], [place] [address]          Radiology Studies: No results found.      Scheduled Meds: Continuous Infusions: . morphine 5 mg/hr (01/06/21 2328)     LOS: 5 days    Time spent: 32 minutes spent on chart review, discussion with nursing staff, consultants, updating family and interview/physical exam; more than 50% of that time was spent in counseling and/or coordination of care.    Jerald Hennington J [place] DO [facility] Available via Epic secure chat 7am-7pm After these hours, please refer to coverage provider listed on [email or web address] 01/07/2021, 1:50 PM
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
## 2021-01-20 NOTE — Plan of Care (Signed)
Pt expired at 0550, family notified and in route

## 2021-01-20 NOTE — Death Summary Note (Signed)
DEATH SUMMARY   Patient Details  Name: Robin Guzman MRN: [id number] DOB: [birth year]  Admission/Discharge Information   Admit Date:  01/14/21  Date of Death: Date of Death: January 20, 2021  Time of Death: Time of Death: 0550  Length of Stay: 07-Feb-2023  Referring Physician: Celene Squibb, MD   Reason(s) for Hospitalization  Altered mental status  Diagnoses  Preliminary cause of death: Urinary tract infection Secondary Diagnoses (including complications and co-morbidities):  Principal Problem:   Acute metabolic encephalopathy Active Problems:   Chronic diastolic heart failure ([place])   Essential hypertension   Controlled type 2 diabetes mellitus without complication, without long-term current use of insulin (HCC)   Hypercholesteremia   UTI (urinary tract infection)   CAD (coronary artery disease)   Moderate protein malnutrition (HCC)   Prolonged QT interval   GERD (gastroesophageal reflux disease)   Hyperkalemia   AKI (acute kidney injury) [facility])   Dehydration   Brief Hospital Course (including significant findings, care, treatment, and services provided and events leading to death)  Robin Guzman is a 87 y.o. year old female with past medical history significant for osteoarthritis, CAD, chronic diastolic congestive heart failure, type 2 diabetes mellitus, essential hypertension, dyslipidemia and dementia who was brought to the ED via EMS for confusion.  Patient was noted by family to be altered around midnight and was confused beyond her normal baseline.  In the ED, BP 115/59, temperature 97.9 F, RR 10, HR 71, SPO2 97% on room air.  Sodium 137, potassium 5.5, chloride 104, bicarb 21, glucose 137, BUN 36, creatinine 1.86, WBC 9.2, hemoglobin 10.5, platelets 116.  CT head with no acute intracranial abnormality.  Chest x-ray with no acute cardiopulmonary disease process.  Patient was noted to have acute urinary retention and Foley catheter was placed.  Hospital service consulted for  further evaluation and management of acute metabolic encephalopathy with underlying dementia.  Acute metabolic encephalopathy with delirium, POA Urinary tract infection, POA Advanced dementia Patient presenting to the ED with confusion.  CT head without contrast and chest x-ray unrevealing.  Thought initially secondary to urinary tract infection in which was treated with 3-day course of ceftriaxone.  Likely complicated by her advanced dementia.  No significant improvement in her mental status; and was transitioned to comfort care on a morphine drip with as needed Ativan and Robinul for secretions.  Patient passed peacefully on 2021-01-20 at oh 5:50 AM.  Patient's family was notified.  AKI with hyperkalemia Creatinine 1.86 on admission, likely multifactorial given obstructive uropathy, infection with UTI and poor oral intake.  With IV antibiotics and IV fluid hydration patient's creatinine improved from 1.86-0.96.  Although no improvement in mental status and was ultimately transitioned to comfort measures as above.  Essential hypertension Chronic diastolic congestive heart failure Type 2 diabetes mellitus with dyslipidemia CAD Prolonged QTC Left bundle branch block Chronic anemia of chronic kidney disease Iron deficiency anemia Depression/anxiety Moderate protein calorie malnutrition Advanced dementia Body mass index is 20.21 kg/m.     Pertinent Labs and Studies  Significant Diagnostic Studies CT Head Wo Contrast  Result Date: 2021/01/14 CLINICAL DATA:  Altered mental status EXAM: CT HEAD WITHOUT CONTRAST TECHNIQUE: Contiguous axial images were obtained from the base of the skull through the vertex without intravenous contrast. COMPARISON:  10/13/2020 FINDINGS: Brain: Age related volume loss. Mild chronic small vessel disease throughout the deep white matter. No acute intracranial abnormality. Specifically, no hemorrhage, hydrocephalus, mass lesion, acute infarction, or significant  intracranial injury. Vascular: No hyperdense vessel  or unexpected calcification. Skull: No acute calvarial abnormality. Sinuses/Orbits: Mucous retention cyst in the right maxillary sinus. No acute findings. Other: None IMPRESSION: Atrophy, chronic microvascular disease. No acute intracranial abnormality. Electronically Signed   By: Rolm Baptise M.D.   On: 01/19/2021 01:59   DG Pelvis Portable  Result Date: 12/28/2020 CLINICAL DATA:  Bruising. EXAM: PORTABLE PELVIS 1-2 VIEWS COMPARISON:  October 14, 2020 FINDINGS: The patient has undergone prior percutaneous pinning of the left hip. There is no new acute displaced fracture or dislocation. There is diffuse osteopenia which limits detection of nondisplaced fractures. There are degenerative changes of both hips. IMPRESSION: Negative. Electronically Signed   By: Constance Holster M.D.   On: 12/26/2020 01:52   DG Chest Port 1 View  Result Date: 01/05/2021 CLINICAL DATA:  Pain EXAM: PORTABLE CHEST 1 VIEW COMPARISON:  October 13, 2020 FINDINGS: The heart size and mediastinal contours are within normal limits. Both lungs are clear. The visualized skeletal structures are unremarkable. Aortic calcifications are noted. IMPRESSION: No active disease. Electronically Signed   By: Constance Holster M.D.   On: 01/03/2021 01:54    Microbiology Recent Results (from the past 240 hour(s))  SARS CORONAVIRUS 2 (TAT 6-24 HRS) Nasopharyngeal Nasopharyngeal Swab     Status: None   Collection Time: 12/23/2020  2:13 AM   Specimen: Nasopharyngeal Swab  Result Value Ref Range Status   SARS Coronavirus 2 NEGATIVE NEGATIVE Final    Comment: (NOTE) SARS-CoV-2 target nucleic acids are NOT DETECTED.  The SARS-CoV-2 RNA is generally detectable in upper and lower respiratory specimens during the acute phase of infection. Negative results do not preclude SARS-CoV-2 infection, do not rule out co-infections with other pathogens, and should not be used as the sole basis for  treatment or other patient management decisions. Negative results must be combined with clinical observations, patient history, and epidemiological information. The expected result is Negative.  Fact Sheet for Patients: SugarRoll.be  Fact Sheet for Healthcare Providers: https://www.woods-mathews.com/  This test is not yet approved or cleared by the [place] FDA and  has been authorized for detection and/or diagnosis of SARS-CoV-2 by FDA under an Emergency Use Authorization (EUA). This EUA will remain  in effect (meaning this test can be used) for the duration of the COVID-19 declaration under Se ction 564(b)(1) of the Act, 21 U.S.C. section 360bbb-3(b)(1), unless the authorization is terminated or revoked sooner.  Performed at [facility], [place] [address] [place], [place] [address]     Lab Basic Metabolic Panel: Recent Labs  Lab 01/11/2021 0104 01/03/21 0631 01/04/21 0614  NA 137 140 142  K 5.5* 4.6 3.9  CL 104 106 105  CO2 21* 25 27  GLUCOSE 137* 108* 141*  BUN 36* 28* 17  CREATININE 1.86* 1.20* 0.96  CALCIUM 9.0 8.8* 9.2  MG 2.0  --   --   PHOS 5.2*  --   --    Liver Function Tests: Recent Labs  Lab 12/30/2020 0104  AST 23  ALT 14  ALKPHOS 98  BILITOT 1.4*  PROT 6.2*  ALBUMIN 3.1*   No results for input(s): LIPASE, AMYLASE in the last 168 hours. Recent Labs  Lab 01/12/2021 0104  AMMONIA 16   CBC: Recent Labs  Lab 01/07/2021 0104 01/03/21 0631 01/04/21 0614  WBC 9.2 7.7 4.5  NEUTROABS 8.3* 6.0 2.7  HGB 10.5* 9.6* 8.4*  HCT 33.1* 29.4* 26.5*  MCV 103.4* 101.0* 100.4*  PLT 116* 136* 94*   Cardiac Enzymes: No results  for input(s): CKTOTAL, CKMB, CKMBINDEX, TROPONINI in the last 168 hours. Sepsis Labs: Recent Labs  Lab 01/19/2021 0104 01/03/21 0631 01/04/21 0614  WBC 9.2 7.7 4.5    Procedures/Operations  none   Milbert Bixler J [place] DO 2021-02-04, 11:07 AM

## 2021-01-20 DEATH — deceased

## 2021-07-27 IMAGING — RF DG C-ARM 1-60 MIN
1 series · 2 of 2 positions shown · non-contrast
Comparison: CT October 13, 2020.

CLINICAL DATA: Cannulated screws.

EXAM:
OPERATIVE left HIP (WITH PELVIS IF PERFORMED) 2 VIEWS
TECHNIQUE: Fluoroscopic spot image(s) were submitted for interpretation
post-operatively.

[Series 1: run · 2 of 2 slices shown]
[im 1/2]
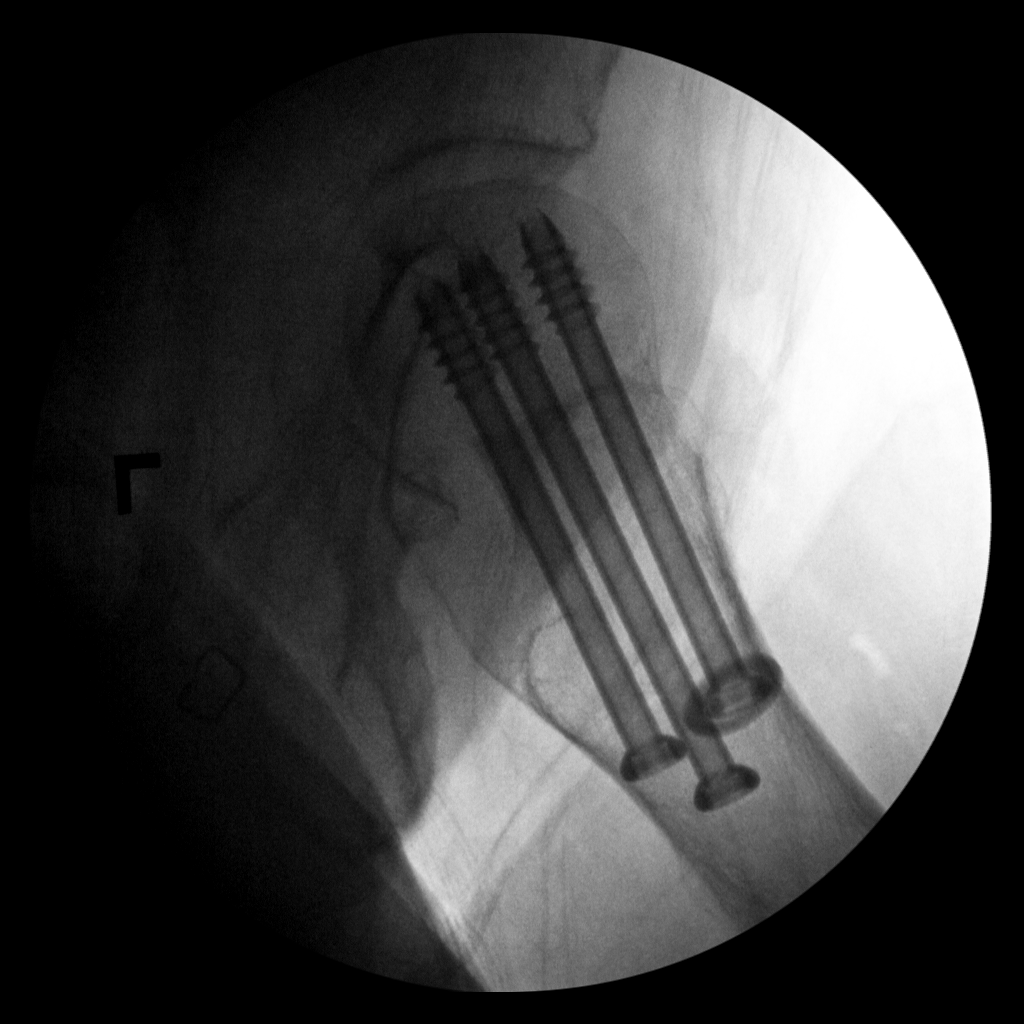
[im 2/2]
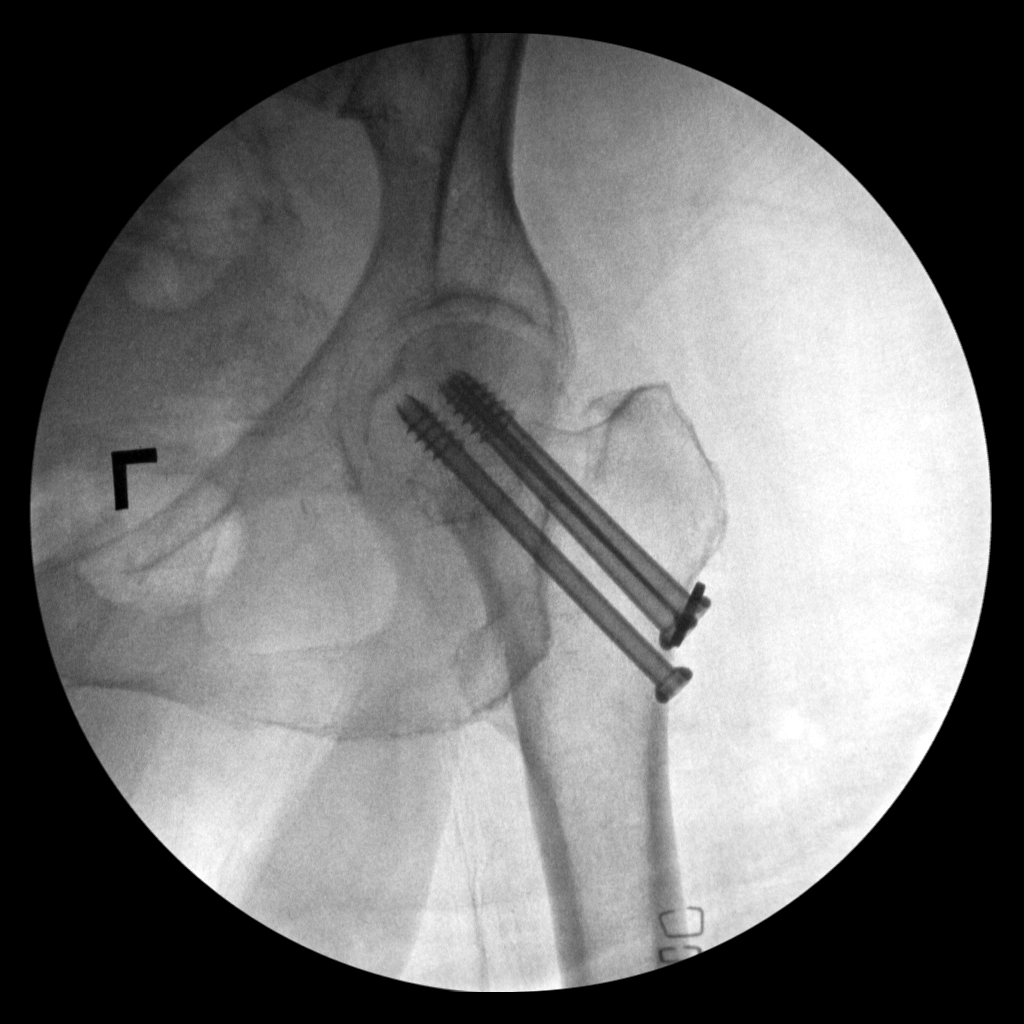

[2 of 2 positions shown; findings below may reference images not displayed]

FINDINGS: Fluoro time: 1 minutes 5 seconds.

Two C-arm fluoroscopic images were obtained intraoperatively and
submitted for post operative interpretation. These images
demonstrate fixation of a left subtrochanteric fracture with
cannulated screws. Please see the performing provider's procedural
report for further detail.
IMPRESSION: Intraoperative fluoroscopic images, as detailed above.

## 2021-07-27 IMAGING — DX DG HIP (WITH OR WITHOUT PELVIS) 1V PORT*L*
2 series · 2 of 2 positions shown · non-contrast
Comparison: 10/13/2020

CLINICAL DATA: Postop

EXAM:
DG HIP (WITH OR WITHOUT PELVIS) 1V PORT LEFT

[hip ap]
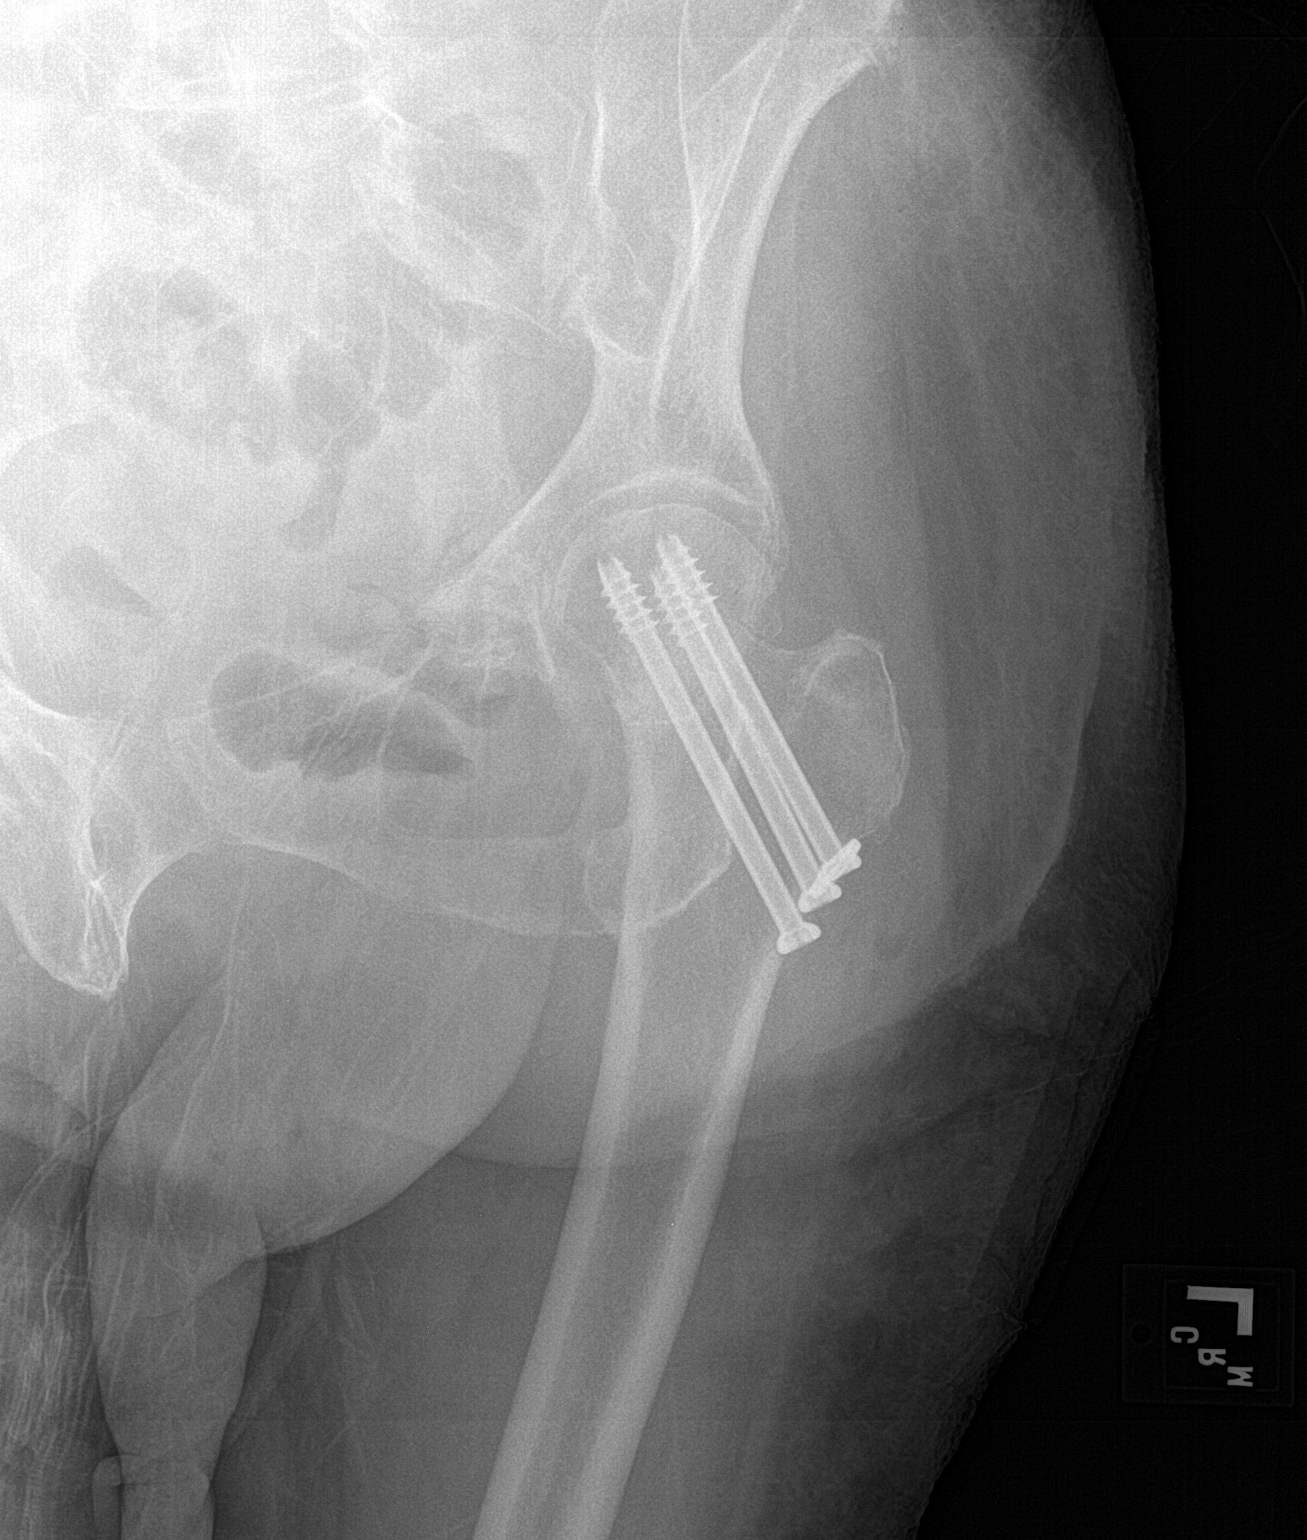

[hip lat]
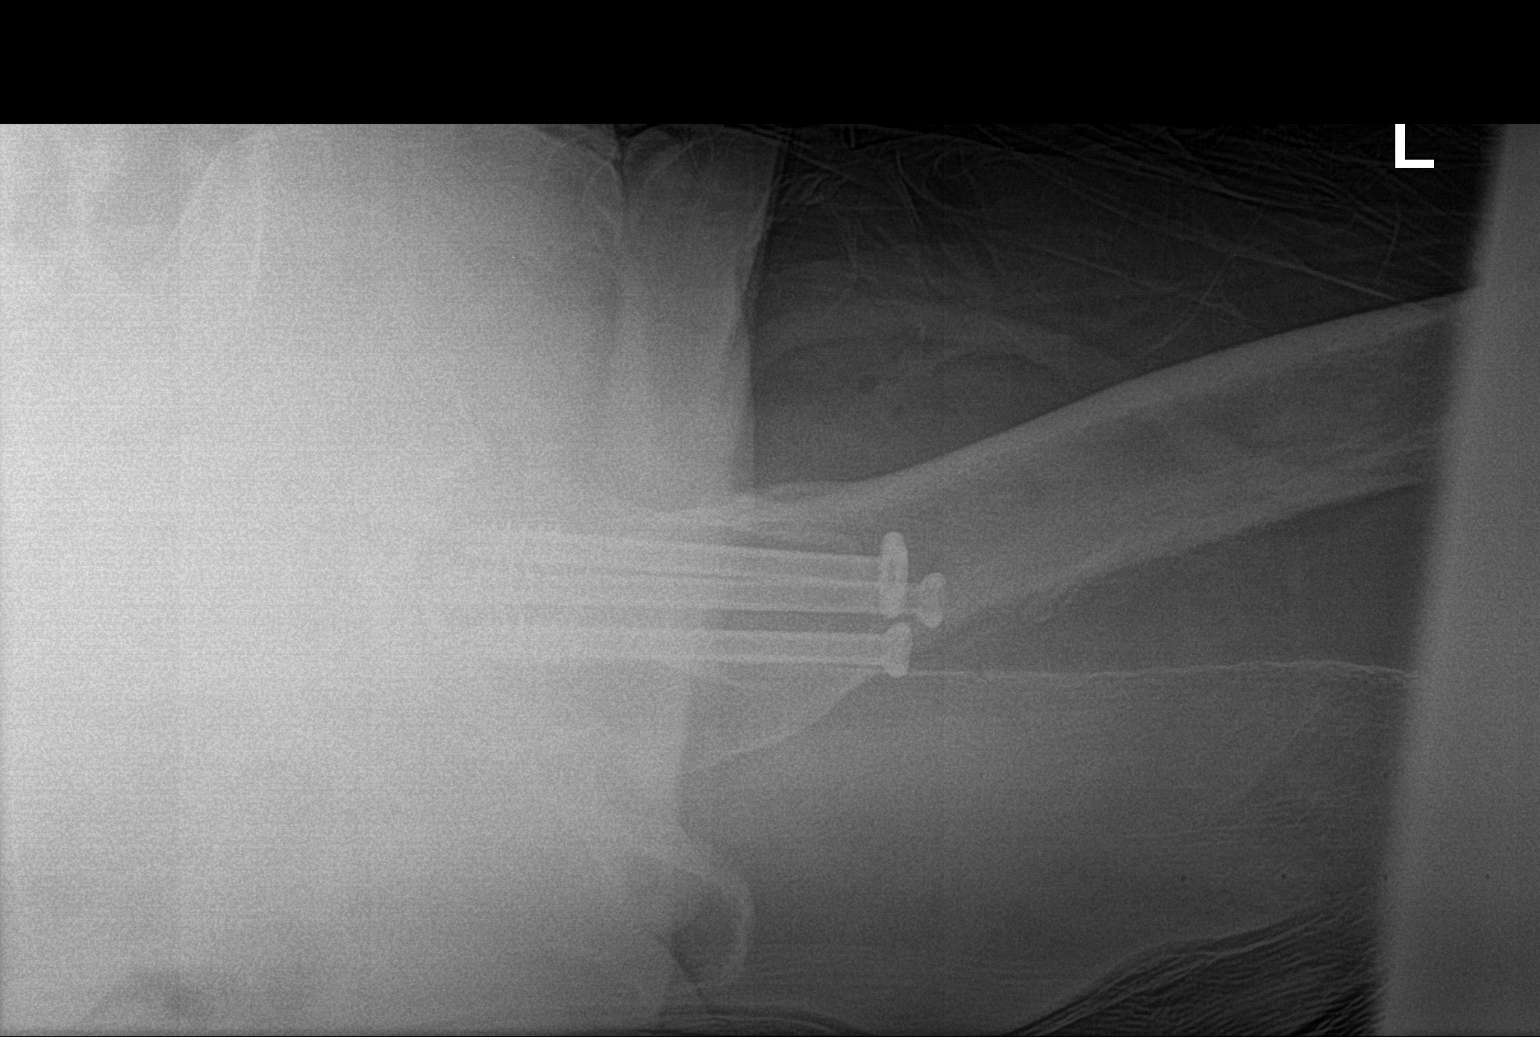

[2 of 2 positions shown; findings below may reference images not displayed]

FINDINGS: Interval 3 screw fixation of proximal left femur for femoral neck
fracture. Intact hardware. Near anatomic alignment.
IMPRESSION: Interval screw fixation of proximal left femur for femoral neck
fracture.

## 2021-07-27 IMAGING — RF DG HIP (WITH PELVIS) OPERATIVE*L*
1 series · 2 of 2 positions shown · non-contrast
Comparison: CT October 13, 2020.

CLINICAL DATA: Cannulated screws.

EXAM:
OPERATIVE left HIP (WITH PELVIS IF PERFORMED) 2 VIEWS
TECHNIQUE: Fluoroscopic spot image(s) were submitted for interpretation
post-operatively.

[Series 1: run · 2 of 2 slices shown]
[im 1/2]
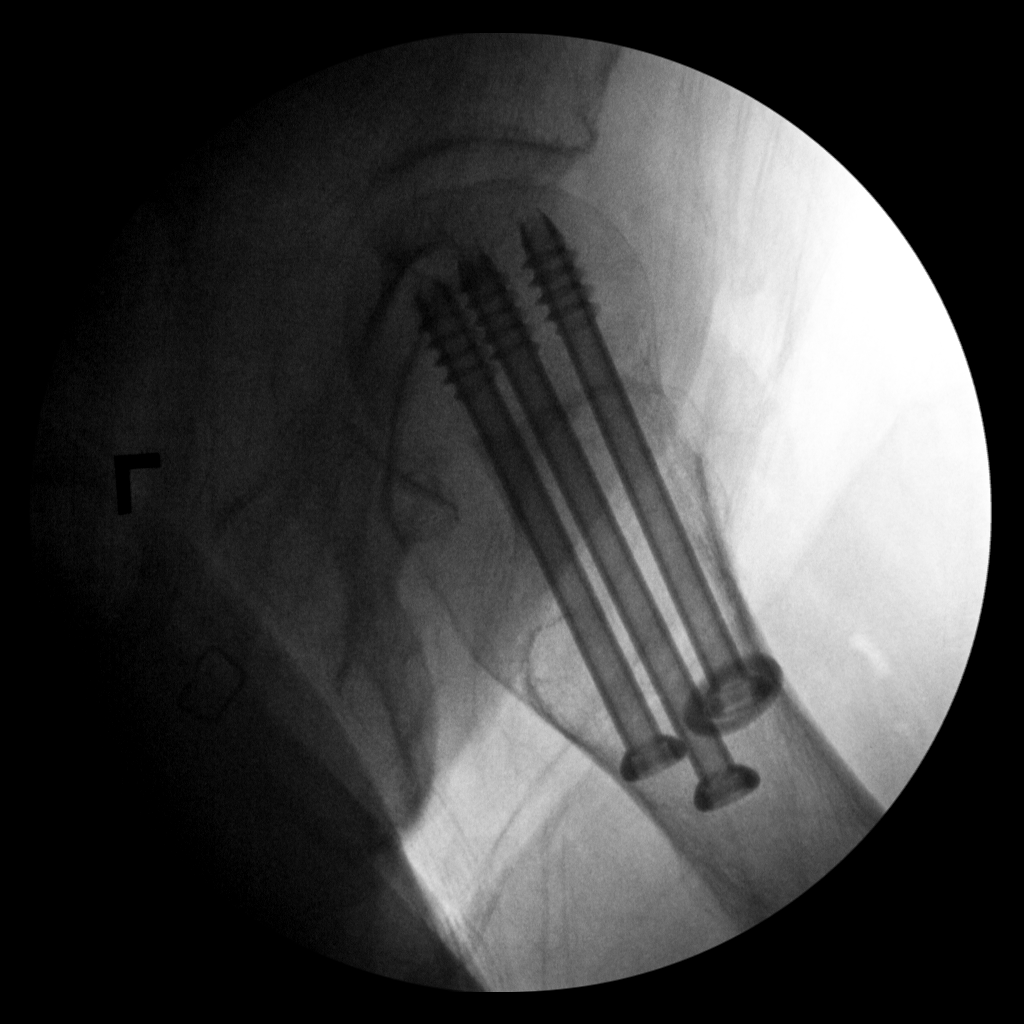
[im 2/2]
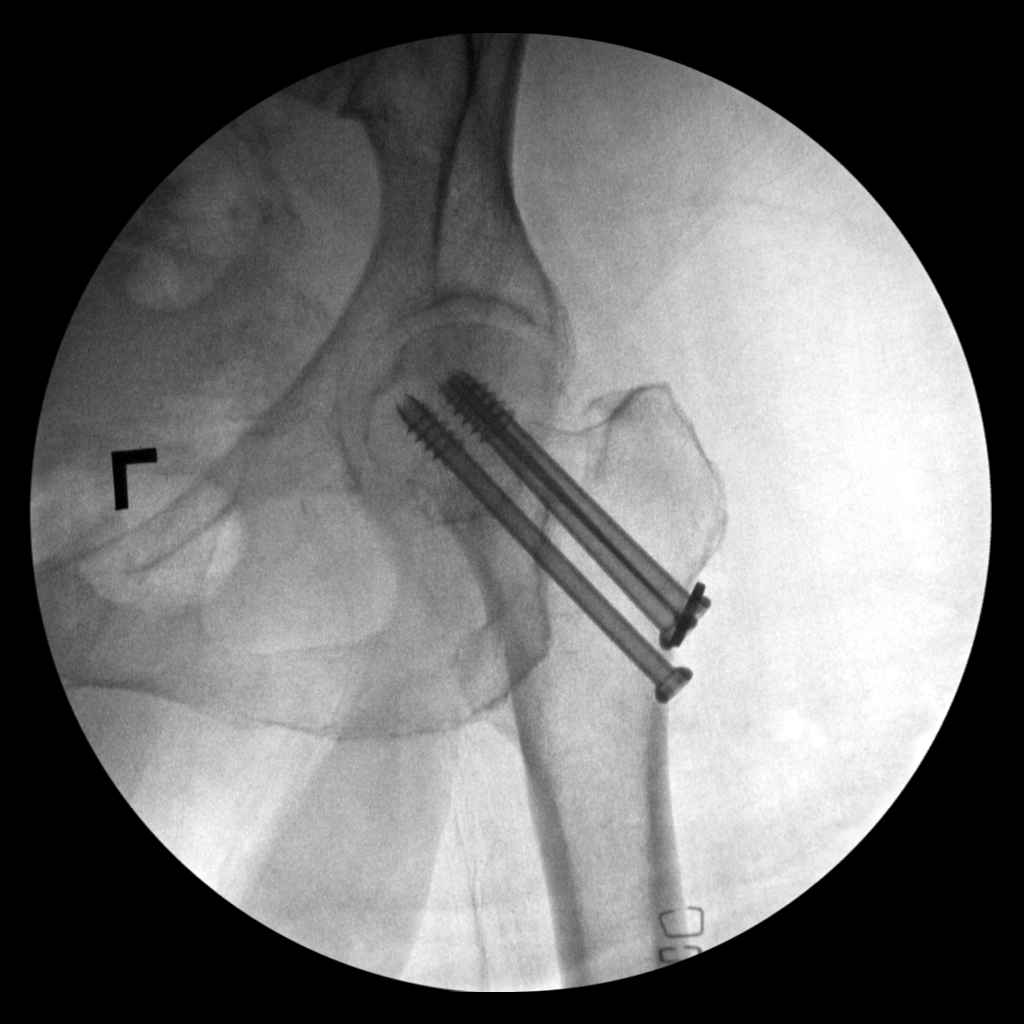

[2 of 2 positions shown; findings below may reference images not displayed]

FINDINGS: Fluoro time: 1 minutes 5 seconds.

Two C-arm fluoroscopic images were obtained intraoperatively and
submitted for post operative interpretation. These images
demonstrate fixation of a left subtrochanteric fracture with
cannulated screws. Please see the performing provider's procedural
report for further detail.
IMPRESSION: Intraoperative fluoroscopic images, as detailed above.
